# Patient Record
Sex: Female | Born: 1955 | Race: Asian | Hispanic: No | Marital: Married | Smoking: Never smoker
Health system: Southern US, Community
[De-identification: ages and names within clinical notes are randomized; demographics above are authoritative.]

## PROBLEM LIST (undated history)

## (undated) DIAGNOSIS — Z87898 Personal history of other specified conditions: Secondary | ICD-10-CM

## (undated) DIAGNOSIS — C539 Malignant neoplasm of cervix uteri, unspecified: Secondary | ICD-10-CM

## (undated) DIAGNOSIS — C541 Malignant neoplasm of endometrium: Secondary | ICD-10-CM

## (undated) DIAGNOSIS — C52 Malignant neoplasm of vagina: Secondary | ICD-10-CM

## (undated) DIAGNOSIS — N289 Disorder of kidney and ureter, unspecified: Secondary | ICD-10-CM

## (undated) HISTORY — PX: ABDOMINAL HYSTERECTOMY: SHX81

---

## 2007-04-15 ENCOUNTER — Other Ambulatory Visit: Admission: RE | Admit: 2007-04-15 | Discharge: 2007-04-15 | Payer: Self-pay | Admitting: Obstetrics & Gynecology

## 2007-04-27 ENCOUNTER — Ambulatory Visit: Admission: RE | Admit: 2007-04-27 | Discharge: 2007-04-27 | Payer: Self-pay | Admitting: Gynecologic Oncology

## 2007-05-04 ENCOUNTER — Ambulatory Visit (HOSPITAL_COMMUNITY): Admission: RE | Admit: 2007-05-04 | Discharge: 2007-05-04 | Payer: Self-pay | Admitting: Gynecology

## 2007-08-31 ENCOUNTER — Ambulatory Visit: Admission: RE | Admit: 2007-08-31 | Discharge: 2007-08-31 | Payer: Self-pay | Admitting: Gynecologic Oncology

## 2008-06-19 ENCOUNTER — Ambulatory Visit (HOSPITAL_COMMUNITY): Admission: RE | Admit: 2008-06-19 | Discharge: 2008-06-19 | Payer: Self-pay | Admitting: Gynecologic Oncology

## 2009-01-19 ENCOUNTER — Encounter: Admission: RE | Admit: 2009-01-19 | Discharge: 2009-01-19 | Payer: Self-pay | Admitting: Family Medicine

## 2009-01-30 ENCOUNTER — Encounter: Admission: RE | Admit: 2009-01-30 | Discharge: 2009-01-30 | Payer: Self-pay | Admitting: Family Medicine

## 2009-02-27 ENCOUNTER — Ambulatory Visit (HOSPITAL_COMMUNITY): Admission: RE | Admit: 2009-02-27 | Discharge: 2009-02-27 | Payer: Self-pay | Admitting: Surgery

## 2009-02-27 ENCOUNTER — Encounter: Admission: RE | Admit: 2009-02-27 | Discharge: 2009-02-27 | Payer: Self-pay | Admitting: Surgery

## 2009-12-07 ENCOUNTER — Encounter: Admission: RE | Admit: 2009-12-07 | Discharge: 2009-12-07 | Payer: Self-pay | Admitting: Surgery

## 2010-02-11 ENCOUNTER — Encounter: Admission: RE | Admit: 2010-02-11 | Discharge: 2010-02-11 | Payer: Self-pay | Admitting: Surgery

## 2010-03-24 DIAGNOSIS — C549 Malignant neoplasm of corpus uteri, unspecified: Secondary | ICD-10-CM

## 2010-04-28 ENCOUNTER — Encounter: Payer: Self-pay | Admitting: Family Medicine

## 2010-07-10 LAB — DIFFERENTIAL
Basophils Absolute: 0.1 10*3/uL (ref 0.0–0.1)
Eosinophils Relative: 2 % (ref 0–5)
Lymphocytes Relative: 22 % (ref 12–46)
Lymphs Abs: 2 10*3/uL (ref 0.7–4.0)
Neutro Abs: 6.1 10*3/uL (ref 1.7–7.7)
Neutrophils Relative %: 68 % (ref 43–77)

## 2010-07-10 LAB — GLUCOSE, CAPILLARY
Glucose-Capillary: 84 mg/dL (ref 70–99)
Glucose-Capillary: 92 mg/dL (ref 70–99)

## 2010-07-10 LAB — COMPREHENSIVE METABOLIC PANEL WITH GFR
ALT: 18 U/L (ref 0–35)
AST: 14 U/L (ref 0–37)
Albumin: 3.5 g/dL (ref 3.5–5.2)
Alkaline Phosphatase: 56 U/L (ref 39–117)
BUN: 14 mg/dL (ref 6–23)
CO2: 26 meq/L (ref 19–32)
Calcium: 9.4 mg/dL (ref 8.4–10.5)
Chloride: 108 meq/L (ref 96–112)
Creatinine, Ser: 0.68 mg/dL (ref 0.4–1.2)
GFR calc non Af Amer: >60 mL/min
Glucose, Bld: 95 mg/dL (ref 70–99)
Potassium: 4.1 meq/L (ref 3.5–5.1)
Sodium: 141 meq/L (ref 135–145)
Total Bilirubin: 0.5 mg/dL (ref 0.3–1.2)
Total Protein: 6.4 g/dL (ref 6.0–8.3)

## 2010-07-10 LAB — CBC
HCT: 38.2 % (ref 36.0–46.0)
Hemoglobin: 13.3 g/dL (ref 12.0–15.0)
MCHC: 34.8 g/dL (ref 30.0–36.0)
MCV: 99.1 fL (ref 78.0–100.0)
Platelets: 307 K/uL (ref 150–400)
RBC: 3.85 MIL/uL — ABNORMAL LOW (ref 3.87–5.11)
RDW: 13.8 % (ref 11.5–15.5)
WBC: 9 K/uL (ref 4.0–10.5)

## 2010-08-20 NOTE — H&P (Signed)
Brandy Hardy, Brandy Hardy              ACCOUNT NO.:  [id number]   MEDICAL RECORD NO.:  [id number]          PATIENT TYPE:  OUT   LOCATION:  GYN                          FACILITY:  [facility]   PHYSICIAN:  Paola A. Duard Brady, MD    DATE OF BIRTH:  12-Mar-1956   DATE OF ADMISSION:  04/27/2007  DATE OF DISCHARGE:                              HISTORY & PHYSICAL   The patient is seen today in consultation at the request of Dr. Hyacinth Meeker.  Brandy Hardy is a 55 year old gravida 1, para 1 with a very complicated  past medical history.  Back in 2002, she had an abnormal Pap smear and  had a cervical polyp which was noted that revealed well-differentiated  adenocarcinoma.  Initially they thought it was an endometrial cancer as  she had a CT scan the abdomen and pelvis revealed an enlarged uterus  with prominence of the endometrium.  She was taken to the operating room  on November 01, 2006, by Dr. Ursula Beath in [place] for an exam under  anesthesia and cervical biopsies.  At that time, there was a large  fungating lesion in the cervix, replacing the entire cervix  circumferentially and it was felt that this most likely represented a  cervical cancer rather than a drop-down metastasis from an endometrial  cancer.  She subsequently completed external and internal radiation in  October 2002.  Per the records that we have, she received 8188 on the  left, 7897 on the right.  It did appear that she received 5040 to the  whole pelvis with additional intracavitary brachytherapy but it is  unclear whether she only received one brachytherapy rather than two or  if the plan was to proceed with interval hysterectomy.  From the notes  we had, she appeared to have had an excellent response to her radiation  therapy with no visible disease.  However, in May 2003, the patient went  TAH/BSO.  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  She had a large myoma involving the cervix.  She had  follow-up after that point, per the patient on February 2007, however,  the last notes we have are from April 2004, at which time she had a  negative CT scan of the abdomen and pelvis.  The patient states her  visit with Dr. Everardo Beals was in February 2007, and has had no care since  that time.  She and her husband moved to [place] due to expenses  in [place] and she was seen by Dr. Hyacinth Meeker in 2009.  In January, when  she saw Dr. Hyacinth Meeker, she was noted to have lesion at the top of the  vaginal cuff.  Biopsy was performed that was consistent with  adenocarcinoma compatible with endometrioid __________  grade 2.  The  patient did have imaging performed consisting of a chest x-ray which  showed borderline to mild cardiomegaly.  CT scan of the abdomen and  pelvis revealed diffuse low-density in the  liver consistent with fatty  infiltration of the liver.  There was no definite evidence of adenopathy  or metastatic disease with no pelvic or inguinal masses, no pelvic  adenopathy or free fluid, no lytic or blastic lesions noted.  This CT  scan was performed here in [place], [place].  The patient was  also no complaints.  She denies any vaginal bleeding or pain.  She is  sexually active and denies any postcoital bleeding.   PAST MEDICAL HISTORY:  1. Endometrial cancer.  2. Arthritis in her knees.  3. Hypercholesterolemia.   ALLERGIES:  NONE.   PAST SURGICAL HISTORY:  Hysterectomy as stated above in 2003.   SOCIAL HISTORY:  She denies tobacco or alcohol.  She is married.  Her  husband is a Naval architect.  She is a gravida 1, para 1 and she works as  travel Water quality scientist.   MEDICATIONS:  Zocor and an anti-inflammatory   FAMILY HISTORY:  Her father died of an MI.  Her mother and sister both  have high blood pressure.   HEALTH MAINTENANCE:  She is not sure when  her last mammogram was, it was  before 2007.  She has never had colonoscopy or done fecal occult blood  testing.   PHYSICAL EXAMINATION:  VITAL SIGNS:  Weight 343 pounds, height 5 feet 2,  blood pressure 148/92, pulse 92.  GENERAL APPEARANCE:  Well-nourished, well-developed female in no acute  distress.  NECK:  Supple.  There is no lymphadenopathy.  No thyromegaly.  LUNGS:  Clear to auscultation bilaterally with distant breath sounds.  CARDIOVASCULAR:  Regular rate and rhythm.  ABDOMEN:  She has a large pannus when standing extending below her mid  thigh.  There is significant discoloration under the pannus.  There is a  high transverse skin incision.  There are no palpable masses or  hepatosplenomegaly, though exam is limited by habitus.  Groins are  negative for adenopathy.  EXTREMITIES:  She has 3+ nonpitting edema, equal bilaterally, with  chronic venostasis changes, equal bilaterally.  PELVIC:  External genitalia is within normal limits.  Vagina is  atrophic.  At the top of the vaginal cuff, it almost appears as if there  is a small portion of cervix versus agglutinated vagina.  It is  difficult to ascertain.  However, at the midpoint there is approximately  a 1 x 1 cm erosion.  Bimanual examination is limited secondary to her  habitus.  However, on rectal examination again there is either a small  rim of cervix remaining versus an area of scar and tumor which measures  approximately 2 cm in size.  It is well circumscribed.  It is not mobile  but does not appear fixed.  The parametria are difficult to ascertain  due to her morbid obesity, but they do not appear to be enlarged or  involved.   ASSESSMENT:  A 55 year old with a history of a stage 2b endometrial  carcinoma who was treated with preoperative radiation therapy followed  by hysterectomy.  It is difficult to ascertain if her radiation was full  dose or not.  The patient does have a central recurrence.  I did discuss   with her that ideally an exonerative procedure would be attempted here,  however, because of her morbid obesity, I do not believe that she is an  exonerative candidate.  I did explain this to her and she seems to  understand.  We did discuss the possibility of proceeding with  chemotherapy as  well as additional radiation.  We do have some records  and I would like her to see Dr. Roselind Messier for evaluation of whether or not  there would be a roll for additional radiation therapy.  She has an  appointment to see him on May 03, 2007.  Her case is very unusual  and I will discuss this my partners at [place], [place].  She  knows that I will communicate with Harriett Sine whether or not there is any  other ideas for treatment.  Her questions were answered.  Her husband  was not able to come with her today as he is a Naval architect and was on  the road.  I communicated with him by cell phone and spoke with him and  answered his questions.  They do know that we do need a little more time  to obtain further information before completely delineating a treatment  plan.  I did discuss weight loss with the patient.  The patient's  husband did state concern regarding her attitude and whether or not she  was depressed.  I did address this with the patient.  She states she is  not depressed, she is just scared and she may be  amenable to antidepressant therapy if needed, but at this point, does  not feel that she wants to proceed with that.  I think it is reasonable  she does have a good understanding of her current situation.  We will  see her again in follow-up pending her consultation with Dr. Roselind Messier.      Paola A. Duard Brady, MD  Electronically Signed     PAG/MEDQ  D:  04/27/2007  T:  04/27/2007  Job:  [id number]   cc:   Jeanice Lim, M.D.   Rodolph Bong, M.D.  Fax: [phone]   Billie Lade, M.D.  Fax: [phone]   Telford Nab, R.N.  [address]  [place], [place] [address]   M.  Leda Quail, MD  Fax: [phone]

## 2010-08-20 NOTE — Consult Note (Signed)
NAMEBOBBE, PILZ              ACCOUNT NO.:  [id number]   MEDICAL RECORD NO.:  [id number]          PATIENT TYPE:  OUT   LOCATION:  GYN                          FACILITY:  [facility]   PHYSICIAN:  John T. Kyla Balzarine, M.D.    DATE OF BIRTH:  [birth year]   DATE OF CONSULTATION:  08/31/2007  DATE OF DISCHARGE:                                 CONSULTATION   CHIEF COMPLAINT:  Followup of recurrent endometrial cancer, on Megace.   HISTORY OF PRESENT ILLNESS:  This patient had a clinical stage IIb  endometrial cancer treated as if it were a cervix cancer with external  beam radiation and brachytherapy, completing treatment in October of  2002.  She then had a completion hysterectomy which revealed invasive  endometrial cancer.  She received no additional adjuvant treatment and  was without evidence of disease.  She then moved to [place] and  in January of 2009 underwent a biopsy of a vaginal lesion which was  compatible with a grade 2/moderately-differentiated endometrioid  adenocarcinoma compatible with an endometrial primary.  She had high  percentage ER and PR receptor positive cells.  She has undergone  significant evaluation radiographically, revealing no evidence of  recurrent disease.   On examination at [facility] in February, she had some thickening of the right  parametrium and given her morbid obesity and parametrium thickening, it  was recommended that she be treated with Megace.  She has completed 3  months of Megace therapy with intermittent hot flashes.  She has been  actively working to lose weight and per her verbal report has lost 15  pounds.  She denies vaginal bleeding, pelvic pain or leg swelling.   PAST MEDICAL HISTORY:  Stage IIb endometrial cancer and has noted above,  morbid obesity, bilateral arthritis the of knees, and  hypercholesterolemia.   MEDICATIONS:  Zocor.   ALLERGIES:  None.   FAMILY HISTORY:  No known malignancies.   PERSONAL AND SOCIAL HISTORY:  Denies  tobacco, ethanol or illicit drug  use.   REVIEW OF SYSTEMS:  Dyspnea on exertion, no orthopnea.  Lower extremity  edema.  Otherwise balance of 10 system review is negative.   EXAM:  VITAL SIGNS:  Stable and afebrile is recorded.  The patient is  anxious, alert and oriented x3, in no acute distress.  LYMPH SURVEY:  Negative for pathologic lymphadenopathy.  ABDOMEN:  Obese, soft and benign with a large pannus that extends into  the mid thigh region.  Speculum examination reveals atrophy with  radiation effect at the upper vagina.  There is no mass per se but there  is a prominent right lateral dimple in the vagina.  No overt friability  although vaginal mucosa is markedly atrophic with some contact bleeding.  Overall, this comprises a significant response to hormonal therapy.   ASSESSMENT:  Recurrent endometrial cancer.   PLAN:  Continue Megace 80 mg b.i.d. and return for followup in 3 months.      John T. Kyla Balzarine, M.D.  Electronically Signed     JTS/MEDQ  D:  08/31/2007  T:  08/31/2007  Job:  [id number]  cc:   Telford Nab, R.N.  [address]  [place], [place] [address]

## 2011-03-13 ENCOUNTER — Other Ambulatory Visit: Payer: Self-pay | Admitting: Family Medicine

## 2011-03-13 DIAGNOSIS — R921 Mammographic calcification found on diagnostic imaging of breast: Secondary | ICD-10-CM

## 2011-06-21 ENCOUNTER — Other Ambulatory Visit: Payer: Self-pay | Admitting: Obstetrics and Gynecology

## 2011-07-02 NOTE — Telephone Encounter (Signed)
May refill Megace

## 2011-07-24 ENCOUNTER — Ambulatory Visit
Admission: RE | Admit: 2011-07-24 | Discharge: 2011-07-24 | Disposition: A | Discharge status: Home / Self Care | Payer: 59 | Source: Ambulatory Visit | Attending: Family Medicine | Admitting: Family Medicine

## 2011-07-24 DIAGNOSIS — R921 Mammographic calcification found on diagnostic imaging of breast: Secondary | ICD-10-CM

## 2012-01-20 ENCOUNTER — Other Ambulatory Visit: Payer: Self-pay | Admitting: Obstetrics and Gynecology

## 2012-02-22 ENCOUNTER — Other Ambulatory Visit: Payer: Self-pay | Admitting: Obstetrics and Gynecology

## 2012-08-31 ENCOUNTER — Other Ambulatory Visit: Payer: Self-pay

## 2012-08-31 DIAGNOSIS — Z1231 Encounter for screening mammogram for malignant neoplasm of breast: Secondary | ICD-10-CM

## 2012-09-03 ENCOUNTER — Ambulatory Visit
Admission: RE | Admit: 2012-09-03 | Discharge: 2012-09-03 | Disposition: A | Discharge status: Home / Self Care | Payer: 59 | Source: Ambulatory Visit

## 2012-09-03 DIAGNOSIS — Z1231 Encounter for screening mammogram for malignant neoplasm of breast: Secondary | ICD-10-CM

## 2012-09-16 DIAGNOSIS — I10 Essential (primary) hypertension: Secondary | ICD-10-CM | POA: Insufficient documentation

## 2012-09-16 DIAGNOSIS — E119 Type 2 diabetes mellitus without complications: Secondary | ICD-10-CM | POA: Insufficient documentation

## 2012-11-22 ENCOUNTER — Other Ambulatory Visit: Payer: Self-pay | Admitting: *Deleted

## 2012-11-22 ENCOUNTER — Other Ambulatory Visit (INDEPENDENT_AMBULATORY_CARE_PROVIDER_SITE_OTHER): Payer: 59

## 2012-11-22 DIAGNOSIS — E785 Hyperlipidemia, unspecified: Secondary | ICD-10-CM

## 2012-11-22 DIAGNOSIS — E119 Type 2 diabetes mellitus without complications: Secondary | ICD-10-CM

## 2012-11-22 LAB — COMPREHENSIVE METABOLIC PANEL
ALT: 17 U/L (ref 0–35)
AST: 19 U/L (ref 0–37)
Albumin: 3.4 g/dL — ABNORMAL LOW (ref 3.5–5.2)
BUN: 13 mg/dL (ref 6–23)
CO2: 26 mEq/L (ref 19–32)
Calcium: 8.8 mg/dL (ref 8.4–10.5)
Chloride: 103 mEq/L (ref 96–112)
GFR: 88.67 mL/min (ref >60.00)
Potassium: 3.7 mEq/L (ref 3.5–5.1)

## 2012-11-22 LAB — LIPID PANEL
Cholesterol: 116 mg/dL (ref 0–200)
LDL Cholesterol: 56 mg/dL (ref 0–99)
Triglycerides: 88 mg/dL (ref 0.0–149.0)

## 2012-11-23 ENCOUNTER — Ambulatory Visit (INDEPENDENT_AMBULATORY_CARE_PROVIDER_SITE_OTHER): Payer: 59 | Admitting: Endocrinology

## 2012-11-23 ENCOUNTER — Encounter: Payer: Self-pay | Admitting: Endocrinology

## 2012-11-23 VITALS — BP 114/60 | HR 75 | Temp 97.9°F | Resp 12 | Ht 63.0 in | Wt 333.2 lb

## 2012-11-23 DIAGNOSIS — E785 Hyperlipidemia, unspecified: Secondary | ICD-10-CM | POA: Insufficient documentation

## 2012-11-23 DIAGNOSIS — E119 Type 2 diabetes mellitus without complications: Secondary | ICD-10-CM | POA: Insufficient documentation

## 2012-11-23 DIAGNOSIS — R609 Edema, unspecified: Secondary | ICD-10-CM

## 2012-11-23 DIAGNOSIS — E559 Vitamin D deficiency, unspecified: Secondary | ICD-10-CM

## 2012-11-23 DIAGNOSIS — I10 Essential (primary) hypertension: Secondary | ICD-10-CM

## 2012-11-23 MED ORDER — HYDROCHLOROTHIAZIDE 12.5 MG PO CAPS: 12.5000 mg | ORAL_CAPSULE | Freq: Every day | ORAL | Status: DC

## 2012-11-23 NOTE — Progress Notes (Signed)
Patient ID: Brandy Hardy, female   DOB: [birth year], 57 y.o.   MRN: [id number]  Chief complaint: Ankle pain  History of Present Illness:  She is complaining of discomfort above the ankle which is more towards the evening but not when lying down or walking.   She has had multiple other problems as follows:   History of mild diabetes, very well controlled with 1500 mg metformin and normal A1c of 5.8. She has been on metformin for 2-3 years. Also fasting blood sugar is normal. She does not exercise since she thinks she is working long hours. Also has had some knee pains  She has had difficulty losing weight but has not been interested in weight loss medications  Hyperlipidemia with history of increase in LDL particle number. Lipids are well controlled now with a regimen of Lipitor and niacin  Hypertension: This has been relatively mild and currently taking a combination of lisinopril and amlodipine 5 mg  History of vitamin D deficiency, taking weekly supplement  She is taking 81 mg aspirin      Medication List       This list is accurate as of: 11/23/12  9:36 AM.  Always use your most recent med list.               amLODipine 5 MG tablet  Commonly known as:  NORVASC  Take 5 mg by mouth daily.     aspirin 325 MG tablet  Take 325 mg by mouth daily.     atorvastatin 40 MG tablet  Commonly known as:  LIPITOR  Take 40 mg by mouth daily.     lisinopril 20 MG tablet  Commonly known as:  PRINIVIL,ZESTRIL  Take 20 mg by mouth daily.     megestrol 40 MG tablet  Commonly known as:  MEGACE  TAKE 2 TABLETS BY MOUTH TWICE DAILY     metFORMIN 750 MG 24 hr tablet  Commonly known as:  GLUCOPHAGE-XR  Take 750 mg by mouth daily with breakfast. 2 tablets with breakfast     niacin 500 MG tablet  Commonly known as:  SLO-NIACIN  Take 500 mg by mouth daily. 3 tablets daily     oxaprozin 600 MG tablet  Commonly known as:  DAYPRO  Take 600 mg by mouth daily. 2 tablets daily     Vitamin D (Ergocalciferol) 50000 UNITS Caps capsule  Commonly known as:  DRISDOL  Take 50,000 Units by mouth.        Allergies: No Known Allergies  No past medical history on file.  No past surgical history on file.  No family history on file.  Social History:  reports that she has never smoked. She has never used smokeless tobacco. Her alcohol and drug histories are not on file.  Review of Systems  She is on Megace for her uterine cancer.  Appointment on 11/22/2012  Component Date Value Range Status  . Cholesterol 11/22/2012 116  0 - 200 mg/dL Final   ATP III Classification       Desirable:  < 200 mg/dL               Borderline High:  200 - 239 mg/dL          High:  > = 578 mg/dL  . Triglycerides 11/22/2012 88.0  0.0 - 149.0 mg/dL Final   Normal:  <469 mg/dLBorderline High:  150 - 199 mg/dL  . HDL 11/22/2012 42.30  >39.00 mg/dL Final  . VLDL 62/95/2841 17.6  0.0 - 40.0 mg/dL Final  . LDL Cholesterol 11/22/2012 56  0 - 99 mg/dL Final  . Total CHOL/HDL Ratio 11/22/2012 3   Final                  Men          Women1/2 Average Risk     3.4          3.3Average Risk          5.0          4.42X Average Risk          9.6          7.13X Average Risk          15.0          11.0                      . Sodium 11/22/2012 137  135 - 145 mEq/L Final  . Potassium 11/22/2012 3.7  3.5 - 5.1 mEq/L Final  . Chloride 11/22/2012 103  96 - 112 mEq/L Final  . CO2 11/22/2012 26  19 - 32 mEq/L Final  . Glucose, Bld 11/22/2012 87  70 - 99 mg/dL Final  . BUN 38/25/0539 13  6 - 23 mg/dL Final  . Creatinine, Ser 11/22/2012 0.7  0.4 - 1.2 mg/dL Final  . Total Bilirubin 11/22/2012 0.5  0.3 - 1.2 mg/dL Final  . Alkaline Phosphatase 11/22/2012 55  39 - 117 U/L Final  . AST 11/22/2012 19  0 - 37 U/L Final  . ALT 11/22/2012 17  0 - 35 U/L Final  . Total Protein 11/22/2012 6.5  6.0 - 8.3 g/dL Final  . Albumin 76/73/4193 3.4* 3.5 - 5.2 g/dL Final  . Calcium 79/05/4095 8.8  8.4 - 10.5 mg/dL Final  . GFR  35/32/9924 88.67  >60.00 mL/min Final  . Hemoglobin A1C 11/22/2012 5.8  4.6 - 6.5 % Final   Glycemic Control Guidelines for People with Diabetes:Non Diabetic:  <6%Goal of Therapy: <7%Additional Action Suggested:  >8%     EXAM:  BP 114/60  Pulse 75  Temp(Src) 97.9 F (36.6 C)  Resp 12  Ht 5\' 3"  (1.6 m)  Wt 333 lb 3.2 oz (151.139 kg)  BMI 59.04 kg/m2  SpO2 97%  1+ lower leg edema above the sock line Mild petechial type erythema just above the sock line No pedal edema No skin lesions on the feet or other abnormalities  Assessment/Plan:    History of mild diabetes, well controlled with metformin and normal A1c. She does not exercise and recommend at least walking on her days off, more efforts to lose weight. Since her A1c has been consistently good will not start her on home glucose monitoring  Mild edema and some stasis irritation of her lower legs. Will change her amlodipine to HCTZ and this may help. She will call if not better, consider dermatology consultation  Hyperlipidemia with history of increase in LDL particle number. Lipids are well controlled now with a regimen of Lipitor and niacin, will repeat an MR panel on the next visit  Hypertension: Excellent control at present, will followup with medication change as above  History of vitamin D deficiency, will need to have followup levels on the next visit  Morbid obesity, discussed increase efforts to lose weight with diet and exercise. Has difficulty losing weight because of taking Megace from gynecologist  [facility] 11/23/2012, 9:36 AM

## 2012-11-23 NOTE — Patient Instructions (Addendum)
Stop Amlodipine and start HCTZ 12.5mg  daily

## 2013-02-21 ENCOUNTER — Other Ambulatory Visit (INDEPENDENT_AMBULATORY_CARE_PROVIDER_SITE_OTHER): Payer: 59

## 2013-02-21 ENCOUNTER — Ambulatory Visit (INDEPENDENT_AMBULATORY_CARE_PROVIDER_SITE_OTHER): Payer: 59 | Admitting: Endocrinology

## 2013-02-21 ENCOUNTER — Encounter: Payer: Self-pay | Admitting: Endocrinology

## 2013-02-21 VITALS — BP 128/68 | HR 108 | Temp 98.3°F | Resp 12 | Ht 63.0 in | Wt 325.9 lb

## 2013-02-21 DIAGNOSIS — I10 Essential (primary) hypertension: Secondary | ICD-10-CM

## 2013-02-21 DIAGNOSIS — E559 Vitamin D deficiency, unspecified: Secondary | ICD-10-CM

## 2013-02-21 DIAGNOSIS — E785 Hyperlipidemia, unspecified: Secondary | ICD-10-CM

## 2013-02-21 DIAGNOSIS — E119 Type 2 diabetes mellitus without complications: Secondary | ICD-10-CM

## 2013-02-21 DIAGNOSIS — IMO0002 Reserved for concepts with insufficient information to code with codable children: Secondary | ICD-10-CM

## 2013-02-21 DIAGNOSIS — M1710 Unilateral primary osteoarthritis, unspecified knee: Secondary | ICD-10-CM

## 2013-02-21 LAB — URINALYSIS, ROUTINE W REFLEX MICROSCOPIC
Ketones, ur: NEGATIVE
Specific Gravity, Urine: >=1.03 (ref 1.000–1.030)
Urine Glucose: NEGATIVE
Urobilinogen, UA: 0.2 (ref 0.0–1.0)
pH: 6 (ref 5.0–8.0)

## 2013-02-21 LAB — MICROALBUMIN / CREATININE URINE RATIO: Creatinine,U: 214.5 mg/dL

## 2013-02-21 NOTE — Patient Instructions (Signed)
Walk as much as possible

## 2013-02-21 NOTE — Progress Notes (Signed)
Patient ID: Brandy Hardy, female   DOB: 04-11-1955, 57 y.o.   MRN: [id number]  Chief complaint:  Followup of various issues  History of Present Illness:  She has had multiple  problems as follows:   History of mild diabetes, very well controlled with 1500 mg metformin and usually upper normal A1c. She has been on metformin for 2-3 years. Again fasting blood sugar is normal. She does not exercise much because of her work schedule. Also has had some knee pains. A1c again excellent at 5.8  She has had difficulty losing weight but has lost some weight since her last visit, possibly from less edema  Ankle swelling: This is much better now with starting HCTZ instead of amlodipine  Hyperlipidemia with history of increase in LDL particle number. Lipids are well controlled now with a regimen of Lipitor and niacin, HDL improved but still low normal  Hypertension: This has been relatively mild and currently taking a combination of lisinopril and HCTZ 12.5 mg  History of vitamin D deficiency, taking weekly supplement  She is taking 81 mg aspirin for prophylaxis  Her orthopedic surgeon has reduced her anti-inflammatory drug to once a day now and she takes Tylenol as needed    Wt Readings from Last 3 Encounters:  02/21/13 325 lb 14.4 oz (147.827 kg)  11/23/12 333 lb 3.2 oz (151.139 kg)      Medication List       This list is accurate as of: 02/21/13  8:34 AM.  Always use your most recent med list.               amLODipine 5 MG tablet  Commonly known as:  NORVASC     aspirin 325 MG tablet  Take 325 mg by mouth daily.     atorvastatin 40 MG tablet  Commonly known as:  LIPITOR  Take 40 mg by mouth daily.     hydrochlorothiazide 12.5 MG capsule  Commonly known as:  MICROZIDE  Take 1 capsule (12.5 mg total) by mouth daily.     lisinopril 20 MG tablet  Commonly known as:  PRINIVIL,ZESTRIL  Take 20 mg by mouth daily.     megestrol 40 MG tablet  Commonly known as:  MEGACE  TAKE  2 TABLETS BY MOUTH TWICE DAILY     metFORMIN 750 MG 24 hr tablet  Commonly known as:  GLUCOPHAGE-XR  Take 750 mg by mouth daily with breakfast. 2 tablets with breakfast     niacin 500 MG tablet  Commonly known as:  SLO-NIACIN  Take 500 mg by mouth daily. 3 tablets daily     oxaprozin 600 MG tablet  Commonly known as:  DAYPRO  Take 600 mg by mouth daily. 2 tablets daily     Vitamin D (Ergocalciferol) 50000 UNITS Caps capsule  Commonly known as:  DRISDOL  Take 50,000 Units by mouth.        Allergies: No Known Allergies  No past medical history on file.  No past surgical history on file.  No family history on file.  Social History:  reports that she has never smoked. She has never used smokeless tobacco. Her alcohol and drug histories are not on file.  Review of Systems  She is on Megace for her uterine cancer.  No snoring  No visits with results within 1 Week(s) from this visit. Latest known visit with results is:  Appointment on 11/22/2012  Component Date Value Range Status  . Cholesterol 11/22/2012 116  0 - 200  mg/dL Final   ATP III Classification       Desirable:  < 200 mg/dL               Borderline High:  200 - 239 mg/dL          High:  > = 161 mg/dL  . Triglycerides 11/22/2012 88.0  0.0 - 149.0 mg/dL Final   Normal:  <096 mg/dLBorderline High:  150 - 199 mg/dL  . HDL 11/22/2012 42.30  >39.00 mg/dL Final  . VLDL 04/54/0981 17.6  0.0 - 40.0 mg/dL Final  . LDL Cholesterol 11/22/2012 56  0 - 99 mg/dL Final  . Total CHOL/HDL Ratio 11/22/2012 3   Final                  Men          Women1/2 Average Risk     3.4          3.3Average Risk          5.0          4.42X Average Risk          9.6          7.13X Average Risk          15.0          11.0                      . Sodium 11/22/2012 137  135 - 145 mEq/L Final  . Potassium 11/22/2012 3.7  3.5 - 5.1 mEq/L Final  . Chloride 11/22/2012 103  96 - 112 mEq/L Final  . CO2 11/22/2012 26  19 - 32 mEq/L Final  . Glucose,  Bld 11/22/2012 87  70 - 99 mg/dL Final  . BUN 19/14/7829 13  6 - 23 mg/dL Final  . Creatinine, Ser 11/22/2012 0.7  0.4 - 1.2 mg/dL Final  . Total Bilirubin 11/22/2012 0.5  0.3 - 1.2 mg/dL Final  . Alkaline Phosphatase 11/22/2012 55  39 - 117 U/L Final  . AST 11/22/2012 19  0 - 37 U/L Final  . ALT 11/22/2012 17  0 - 35 U/L Final  . Total Protein 11/22/2012 6.5  6.0 - 8.3 g/dL Final  . Albumin 56/21/3086 3.4* 3.5 - 5.2 g/dL Final  . Calcium 57/84/6962 8.8  8.4 - 10.5 mg/dL Final  . GFR 95/28/4132 88.67  >60.00 mL/min Final  . Hemoglobin A1C 11/22/2012 5.8  4.6 - 6.5 % Final   Glycemic Control Guidelines for People with Diabetes:Non Diabetic:  <6%Goal of Therapy: <7%Additional Action Suggested:  >8%     EXAM:  BP 128/68  Pulse 108  Temp(Src) 98.3 F (36.8 C)  Resp 12  Ht 5\' 3"  (1.6 m)  Wt 325 lb 14.4 oz (147.827 kg)  BMI 57.74 kg/m2  SpO2 95%   No pedal edema  Assessment/Plan:    History of mild diabetes, well controlled with metformin and normal A1c. She does not exercise  can of and recommend at least walking on her days off, more efforts to lose weight. Since her A1c has been consistently good will not start her on home glucose monitoring  Mild edema resolved with changing amlodipine to HCTZ   Hyperlipidemia with history of increase in LDL particle number. Lipids are well controlled now with a regimen of Lipitor and niacin, will repeat NMR panel on the next visit  Hypertension: Excellent control at present  History of vitamin D deficiency, will need  to have followup levels on the next visit  Morbid obesity, discussed increase efforts to lose weight with diet and exercise. Has difficulty losing weight because of taking Megace from gynecologist  [facility] 02/21/2013, 8:34 AM

## 2013-02-22 LAB — COMPREHENSIVE METABOLIC PANEL
Albumin: 3.7 g/dL (ref 3.5–5.2)
Alkaline Phosphatase: 58 U/L (ref 39–117)
BUN: 16 mg/dL (ref 6–23)
CO2: 25 mEq/L (ref 19–32)
Calcium: 9.6 mg/dL (ref 8.4–10.5)
GFR: 79.59 mL/min (ref >60.00)
Glucose, Bld: 98 mg/dL (ref 70–99)
Potassium: 3.8 mEq/L (ref 3.5–5.1)
Sodium: 142 mEq/L (ref 135–145)
Total Protein: 6.9 g/dL (ref 6.0–8.3)

## 2013-02-22 LAB — LIPOPROTEIN ANALYSIS BY NMR
HDL Particle Number: 33.9 umol/L (ref >=30.5)
LDL Size: 20.3 nm — ABNORMAL LOW (ref >20.5)
LP-IR Score: 69 — ABNORMAL HIGH (ref <=45)
Small LDL Particle Number: 754 nmol/L — ABNORMAL HIGH (ref <=527)

## 2013-02-22 LAB — LIPID PANEL
Cholesterol: 132 mg/dL (ref 0–200)
LDL Cholesterol: 68 mg/dL (ref 0–99)
Triglycerides: 89 mg/dL (ref 0.0–149.0)

## 2013-02-22 LAB — VITAMIN D 25 HYDROXY (VIT D DEFICIENCY, FRACTURES): Vit D, 25-Hydroxy: 46 ng/mL (ref 30–89)

## 2013-03-27 ENCOUNTER — Other Ambulatory Visit: Payer: Self-pay | Admitting: Endocrinology

## 2013-06-29 ENCOUNTER — Other Ambulatory Visit: Payer: Self-pay | Admitting: Endocrinology

## 2013-07-01 ENCOUNTER — Other Ambulatory Visit (INDEPENDENT_AMBULATORY_CARE_PROVIDER_SITE_OTHER): Payer: 59

## 2013-07-01 ENCOUNTER — Ambulatory Visit: Payer: 59 | Admitting: Endocrinology

## 2013-07-01 ENCOUNTER — Ambulatory Visit (INDEPENDENT_AMBULATORY_CARE_PROVIDER_SITE_OTHER): Payer: 59 | Admitting: Endocrinology

## 2013-07-01 ENCOUNTER — Other Ambulatory Visit: Payer: 59

## 2013-07-01 ENCOUNTER — Encounter: Payer: Self-pay | Admitting: Endocrinology

## 2013-07-01 VITALS — BP 118/68 | HR 116 | Temp 98.1°F | Resp 16 | Ht 63.0 in | Wt 313.4 lb

## 2013-07-01 DIAGNOSIS — M179 Osteoarthritis of knee, unspecified: Secondary | ICD-10-CM

## 2013-07-01 DIAGNOSIS — E559 Vitamin D deficiency, unspecified: Secondary | ICD-10-CM

## 2013-07-01 DIAGNOSIS — E119 Type 2 diabetes mellitus without complications: Secondary | ICD-10-CM

## 2013-07-01 DIAGNOSIS — IMO0002 Reserved for concepts with insufficient information to code with codable children: Secondary | ICD-10-CM

## 2013-07-01 DIAGNOSIS — I10 Essential (primary) hypertension: Secondary | ICD-10-CM

## 2013-07-01 DIAGNOSIS — M1710 Unilateral primary osteoarthritis, unspecified knee: Secondary | ICD-10-CM

## 2013-07-01 DIAGNOSIS — E785 Hyperlipidemia, unspecified: Secondary | ICD-10-CM

## 2013-07-01 LAB — COMPREHENSIVE METABOLIC PANEL
ALK PHOS: 74 U/L (ref 39–117)
ALT: 33 U/L (ref 0–35)
AST: 24 U/L (ref 0–37)
Albumin: 4 g/dL (ref 3.5–5.2)
BUN: 14 mg/dL (ref 6–23)
CO2: 31 mEq/L (ref 19–32)
Calcium: 9.6 mg/dL (ref 8.4–10.5)
Chloride: 99 mEq/L (ref 96–112)
Creatinine, Ser: 0.7 mg/dL (ref 0.4–1.2)
GFR: 88.48 mL/min (ref >60.00)
Glucose, Bld: 97 mg/dL (ref 70–99)
POTASSIUM: 3.5 meq/L (ref 3.5–5.1)
Sodium: 137 mEq/L (ref 135–145)
Total Bilirubin: 0.4 mg/dL (ref 0.3–1.2)
Total Protein: 7.1 g/dL (ref 6.0–8.3)

## 2013-07-01 LAB — CBC WITH DIFFERENTIAL/PLATELET
Basophils Absolute: 0 10*3/uL (ref 0.0–0.1)
Basophils Relative: 0.5 % (ref 0.0–3.0)
Eosinophils Absolute: 0.2 10*3/uL (ref 0.0–0.7)
Eosinophils Relative: 3.3 % (ref 0.0–5.0)
HCT: 40.1 % (ref 36.0–46.0)
Hemoglobin: 13.2 g/dL (ref 12.0–15.0)
Lymphocytes Relative: 19.4 % (ref 12.0–46.0)
Lymphs Abs: 1.3 10*3/uL (ref 0.7–4.0)
MCHC: 33 g/dL (ref 30.0–36.0)
MCV: 99.9 fl (ref 78.0–100.0)
MONOS PCT: 7.5 % (ref 3.0–12.0)
Monocytes Absolute: 0.5 10*3/uL (ref 0.1–1.0)
NEUTROS PCT: 69.3 % (ref 43.0–77.0)
Neutro Abs: 4.6 10*3/uL (ref 1.4–7.7)
PLATELETS: 323 10*3/uL (ref 150.0–400.0)
RBC: 4.01 Mil/uL (ref 3.87–5.11)
RDW: 13.5 % (ref 11.5–14.6)
WBC: 6.7 10*3/uL (ref 4.5–10.5)

## 2013-07-01 LAB — HEMOGLOBIN A1C: HEMOGLOBIN A1C: 5.9 % (ref 4.6–6.5)

## 2013-07-01 NOTE — Progress Notes (Signed)
Patient ID: Brandy Hardy, female   DOB: 1955-10-19, 58 y.o.   MRN: [id number]   Chief complaint:  Followup of various issues  History of Present Illness:  She has had multiple  problems as follows:   History of mild diabetes, initially diagnosed on glucose tolerance test. This is very well controlled with 1500 mg metformin and usually upper normal A1c. She has been on metformin since 2009. She does not exercise  because of her work schedule and knee pains. A1c last was excellent at 5.8  Occaisonal loose stools 3x per day, mostly towards the evening and not associated with abdominal pain or nausea for about 2 weeks. Asking about side effects from medications  She has lost weight since her Megace was stopped  Ankle swelling: This is not a problem with starting HCTZ instead of amlodipine  Hyperlipidemia with history of increase in LDL particle number. LDL well controlled now with a regimen of Lipitor and niacin, HDL improved but still low normal. Her LDL particle number was high on the last visit  Hypertension: This has been relatively mild and currently taking a combination of lisinopril and HCTZ 12.5 mg. Did not stop her amlodipine as directed  History of vitamin D deficiency, taking weekly supplement  She does not see her primary care physician for regular preventive physical exams    Wt Readings from Last 3 Encounters:  07/01/13 313 lb 6.4 oz (142.157 kg)  02/21/13 325 lb 14.4 oz (147.827 kg)  11/23/12 333 lb 3.2 oz (151.139 kg)   LABS:  Lab Results  Component Value Date   HGBA1C 5.9 02/21/2013   HGBA1C 5.8 11/22/2012   Lab Results  Component Value Date   MICROALBUR 8.1* 02/21/2013   LDLCALC 68 02/21/2013   CREATININE 0.8 02/21/2013      Medication List       This list is accurate as of: 07/01/13 10:27 AM.  Always use your most recent med list.               acetaminophen 500 MG tablet  Commonly known as:  TYLENOL  Take 500 mg by mouth every 6 (six) hours as  needed.     amLODipine 5 MG tablet  Commonly known as:  NORVASC     atorvastatin 40 MG tablet  Commonly known as:  LIPITOR  TAKE 1 TABLET DAILY     hydrochlorothiazide 12.5 MG capsule  Commonly known as:  MICROZIDE  TAKE 1 CAPSULE BY MOUTH EVERY DAY     letrozole 2.5 MG tablet  Commonly known as:  FEMARA     lisinopril 20 MG tablet  Commonly known as:  PRINIVIL,ZESTRIL  Take 20 mg by mouth daily.     metFORMIN 750 MG 24 hr tablet  Commonly known as:  GLUCOPHAGE-XR  TAKE 2 TABLETS ONCE DAILY     niacin 500 MG tablet  Commonly known as:  SLO-NIACIN  Take 500 mg by mouth daily. 3 tablets daily     Vitamin D (Ergocalciferol) 50000 UNITS Caps capsule  Commonly known as:  DRISDOL  TAKE 1 CAPSULE WEEKLY        Allergies: No Known Allergies  No past medical history on file.  No past surgical history on file.  Family History  Problem Relation Age of Onset  . Hypertension Mother   . Heart disease Father 49    Expired  . Diabetes Neg Hx     Social History:  reports that she has never smoked. She has never used  smokeless tobacco. Her alcohol and drug histories are not on file.  Review of Systems  She is on Femara now for her uterine cancer.   EXAM:  BP 118/68  Pulse 116  Temp(Src) 98.1 F (36.7 C)  Resp 16  Ht 5\' 3"  (1.6 m)  Wt 313 lb 6.4 oz (142.157 kg)  BMI 55.53 kg/m2  SpO2 97%  No pedal edema  Assessment/Plan:    History of mild diabetes, well controlled with metformin. She does not exercise and discussed again the need for walking daily to help continue her weight loss. Since her weight loss probably will improve her glucose control will leave off her metformin especially with her having diarrhea recently. Followup in 4 months  Diarrhea: Not clear if etiology and she can leave off metformin for now. She will see PCP if continuing to have problems  Hyperlipidemia with history of increase in LDL particle number. Lipids are well controlled now with a  regimen of Lipitor and niacin, will repeat NMR panel on the next visit  Hypertension: Excellent control at present. Will leave off amlodipine as blood pressure is low normal  History of vitamin D deficiency  Brandy Hardy 07/01/2013, 10:27 AM

## 2013-07-01 NOTE — Patient Instructions (Addendum)
Stop Metformin until diarrhea stopped  Walk daily  Stop amlodipine

## 2013-07-02 LAB — VITAMIN D 25 HYDROXY (VIT D DEFICIENCY, FRACTURES): Vit D, 25-Hydroxy: 48 ng/mL (ref 30–89)

## 2013-07-03 LAB — LIPOPROTEIN ANALYSIS BY NMR
HDL PARTICLE NUMBER: 50.3 umol/L (ref >=30.5)
LDL Particle Number: 670 nmol/L (ref <1000)
LDL Size: 20.1 nm — ABNORMAL LOW (ref >20.5)
LP-IR SCORE: 47 — AB (ref <=45)
Small LDL Particle Number: 417 nmol/L (ref <=527)

## 2013-09-28 ENCOUNTER — Other Ambulatory Visit: Payer: Self-pay | Admitting: Endocrinology

## 2013-10-02 ENCOUNTER — Other Ambulatory Visit: Payer: Self-pay | Admitting: Endocrinology

## 2013-10-28 ENCOUNTER — Other Ambulatory Visit (INDEPENDENT_AMBULATORY_CARE_PROVIDER_SITE_OTHER): Payer: 59

## 2013-10-28 ENCOUNTER — Ambulatory Visit (INDEPENDENT_AMBULATORY_CARE_PROVIDER_SITE_OTHER): Payer: 59 | Admitting: Endocrinology

## 2013-10-28 ENCOUNTER — Encounter: Payer: Self-pay | Admitting: Endocrinology

## 2013-10-28 VITALS — BP 106/60 | HR 92 | Temp 98.0°F | Resp 16 | Ht 63.0 in | Wt 303.0 lb

## 2013-10-28 DIAGNOSIS — I10 Essential (primary) hypertension: Secondary | ICD-10-CM

## 2013-10-28 DIAGNOSIS — E785 Hyperlipidemia, unspecified: Secondary | ICD-10-CM

## 2013-10-28 DIAGNOSIS — E119 Type 2 diabetes mellitus without complications: Secondary | ICD-10-CM

## 2013-10-28 LAB — COMPREHENSIVE METABOLIC PANEL
ALBUMIN: 3.6 g/dL (ref 3.5–5.2)
ALT: 22 U/L (ref 0–35)
AST: 24 U/L (ref 0–37)
Alkaline Phosphatase: 75 U/L (ref 39–117)
BUN: 18 mg/dL (ref 6–23)
CALCIUM: 9.2 mg/dL (ref 8.4–10.5)
CO2: 28 meq/L (ref 19–32)
Chloride: 102 mEq/L (ref 96–112)
Creatinine, Ser: 0.6 mg/dL (ref 0.4–1.2)
GFR: 103.1 mL/min (ref >60.00)
GLUCOSE: 103 mg/dL — AB (ref 70–99)
Potassium: 3.7 mEq/L (ref 3.5–5.1)
SODIUM: 137 meq/L (ref 135–145)
TOTAL PROTEIN: 6.7 g/dL (ref 6.0–8.3)
Total Bilirubin: 0.6 mg/dL (ref 0.2–1.2)

## 2013-10-28 LAB — LIPID PANEL
Cholesterol: 146 mg/dL (ref 0–200)
HDL: 49.6 mg/dL (ref >39.00)
LDL Cholesterol: 64 mg/dL (ref 0–99)
NonHDL: 96.4
Total CHOL/HDL Ratio: 3
Triglycerides: 162 mg/dL — ABNORMAL HIGH (ref 0.0–149.0)
VLDL: 32.4 mg/dL (ref 0.0–40.0)

## 2013-10-28 LAB — HEMOGLOBIN A1C: Hgb A1c MFr Bld: 5.5 % (ref 4.6–6.5)

## 2013-10-28 NOTE — Progress Notes (Addendum)
Patient ID: Brandy Hardy, female   DOB: 1955/10/19, 59 y.o.   MRN: [id number]   Chief complaint:  Followup visit  History of Present Illness:  She has had multiple  problems as follows:   History of mild diabetes, initially diagnosed on glucose tolerance test. This had been very well controlled with 1500 mg metformin and usually upper normal A1c. She had been on metformin since 2009. Because of her symptoms of diarrhea and also weight loss metformin was stopped but she tried this again on her own. Since she had diarrhea the second time she did not continue this. She does not exercise  because of her work schedule and knee pains. A1c has been upper normal and pending from today  She has lost more weight since her Megace was stopped  Hyperlipidemia with history of increase in LDL particle number. LDL well controlled  with a regimen of Lipitor and niacin, HDL improved but still low normal. Her LDL particle number was much improved on the last visit. She was told to reduce her niacin to 2 tablets but she did not get the message  Hypertension: This has been relatively mild and currently taking a combination of half tablet lisinopril and HCTZ 12.5 mg. Did  stop her amlodipine as directed on her last visit and blood pressure was relatively low  History of vitamin D deficiency, taking weekly supplement  She does not see her primary care physician for regular preventive physical exams    Wt Readings from Last 3 Encounters:  10/28/13 303 lb (137.44 kg)  07/01/13 313 lb 6.4 oz (142.157 kg)  02/21/13 325 lb 14.4 oz (147.827 kg)   LABS:  Lab Results  Component Value Date   HGBA1C 5.9 07/01/2013   HGBA1C 5.9 02/21/2013   HGBA1C 5.8 11/22/2012   Lab Results  Component Value Date   MICROALBUR 8.1* 02/21/2013   LDLCALC 68 02/21/2013   CREATININE 0.7 07/01/2013      Medication List       This list is accurate as of: 10/28/13  8:44 AM.  Always use your most recent med list.                acetaminophen 500 MG tablet  Commonly known as:  TYLENOL  Take 500 mg by mouth every 6 (six) hours as needed.     amLODipine 5 MG tablet  Commonly known as:  NORVASC     atorvastatin 40 MG tablet  Commonly known as:  LIPITOR  TAKE 1 TABLET DAILY     hydrochlorothiazide 12.5 MG capsule  Commonly known as:  MICROZIDE  TAKE ONE CAPSULE BY MOUTH DAILY     letrozole 2.5 MG tablet  Commonly known as:  FEMARA     lisinopril 40 MG tablet  Commonly known as:  PRINIVIL,ZESTRIL  TAKE 1/2 TABLET DAILY     metFORMIN 750 MG 24 hr tablet  Commonly known as:  GLUCOPHAGE-XR  TAKE 2 TABLETS ONCE DAILY     niacin 500 MG tablet  Commonly known as:  SLO-NIACIN  Take 500 mg by mouth daily. 3 tablets daily     Vitamin D (Ergocalciferol) 50000 UNITS Caps capsule  Commonly known as:  DRISDOL  TAKE 1 CAPSULE WEEKLY        Allergies: No Known Allergies  No past medical history on file.  No past surgical history on file.  Family History  Problem Relation Age of Onset  . Hypertension Mother   . Heart disease Father 73  Expired  . Diabetes Neg Hx     Social History:  reports that she has never smoked. She has never used smokeless tobacco. Her alcohol and drug histories are not on file.  Review of Systems  She is getting radiation and chemotherapy for cervical cancer   EXAM:  BP 100/66  Pulse 92  Temp(Src) 98 F (36.7 C)  Resp 16  Ht 5\' 3"  (1.6 m)  Wt 303 lb (137.44 kg)  BMI 53.69 kg/m2  SpO2 94%  Repeat blood pressure 106/60  No pedal edema  Assessment/Plan:    History of mild diabetes, previously on metformin. Since she has lost significant amount of weight will reassess her diabetes control today with labs and decide on the need for any medications   Diarrhea: Resolved with stopping metformin  Hyperlipidemia with history of increase in LDL particle number. Lipids are well controlled now with a regimen of Lipitor and niacin, will review labs today    Hypertension: Blood pressure is low normal. Will leave off HCTZ as blood pressure is low normal  History of vitamin D deficiency  Brandy Hardy 10/28/2013, 8:44 AM   Addendum: All labs excellent, niacin dose 2 daily  Appointment on 10/28/2013  Component Date Value Ref Range Status  . Hemoglobin A1C 10/28/2013 5.5  4.6 - 6.5 % Final   Glycemic Control Guidelines for People with Diabetes:Non Diabetic:  <6%Goal of Therapy: <7%Additional Action Suggested:  >8%   . Sodium 10/28/2013 137  135 - 145 mEq/L Final  . Potassium 10/28/2013 3.7  3.5 - 5.1 mEq/L Final  . Chloride 10/28/2013 102  96 - 112 mEq/L Final  . CO2 10/28/2013 28  19 - 32 mEq/L Final  . Glucose, Bld 10/28/2013 103* 70 - 99 mg/dL Final  . BUN 02/72/5366 18  6 - 23 mg/dL Final  . Creatinine, Ser 10/28/2013 0.6  0.4 - 1.2 mg/dL Final  . Total Bilirubin 10/28/2013 0.6  0.2 - 1.2 mg/dL Final  . Alkaline Phosphatase 10/28/2013 75  39 - 117 U/L Final  . AST 10/28/2013 24  0 - 37 U/L Final  . ALT 10/28/2013 22  0 - 35 U/L Final  . Total Protein 10/28/2013 6.7  6.0 - 8.3 g/dL Final  . Albumin 44/06/4740 3.6  3.5 - 5.2 g/dL Final  . Calcium 59/56/3875 9.2  8.4 - 10.5 mg/dL Final  . GFR 64/33/2951 103.10  >60.00 mL/min Final  . Cholesterol 10/28/2013 146  0 - 200 mg/dL Final   ATP III Classification       Desirable:  < 200 mg/dL               Borderline High:  200 - 239 mg/dL          High:  > = 884 mg/dL  . Triglycerides 10/28/2013 162.0* 0.0 - 149.0 mg/dL Final   Normal:  <166 mg/dLBorderline High:  150 - 199 mg/dL  . HDL 10/28/2013 49.60  >39.00 mg/dL Final  . VLDL 10/05/1599 32.4  0.0 - 40.0 mg/dL Final  . LDL Cholesterol 10/28/2013 64  0 - 99 mg/dL Final  . Total CHOL/HDL Ratio 10/28/2013 3   Final                  Men          Women1/2 Average Risk     3.4          3.3Average Risk          5.0  4.42X Average Risk          9.6          7.13X Average Risk          15.0          11.0                      . NonHDL  10/28/2013 96.40   Final   NOTE:  Non-HDL goal should be 30 mg/dL higher than patient's LDL goal (i.e. LDL goal of < 70 mg/dL, would have non-HDL goal of < 100 mg/dL)

## 2013-10-28 NOTE — Patient Instructions (Signed)
Niacin 2 daily Stop HCTZ

## 2013-10-29 NOTE — Progress Notes (Signed)
Quick Note:  Glucose is nearly normal, no need to start metformin, cholesterol controlled, reduce niacin to 2 a day as discussed ______

## 2013-12-26 ENCOUNTER — Other Ambulatory Visit: Payer: Self-pay | Admitting: Endocrinology

## 2013-12-29 ENCOUNTER — Ambulatory Visit (INDEPENDENT_AMBULATORY_CARE_PROVIDER_SITE_OTHER): Payer: 59 | Admitting: Endocrinology

## 2013-12-29 ENCOUNTER — Encounter: Payer: Self-pay | Admitting: Endocrinology

## 2013-12-29 VITALS — BP 116/66 | HR 114 | Temp 98.6°F | Ht 63.0 in | Wt 298.0 lb

## 2013-12-29 DIAGNOSIS — R7309 Other abnormal glucose: Secondary | ICD-10-CM

## 2013-12-29 DIAGNOSIS — I10 Essential (primary) hypertension: Secondary | ICD-10-CM

## 2013-12-29 DIAGNOSIS — E785 Hyperlipidemia, unspecified: Secondary | ICD-10-CM

## 2013-12-29 DIAGNOSIS — R7302 Impaired glucose tolerance (oral): Secondary | ICD-10-CM

## 2013-12-29 LAB — BASIC METABOLIC PANEL
BUN: 13 mg/dL (ref 6–23)
CHLORIDE: 102 meq/L (ref 96–112)
CO2: 27 meq/L (ref 19–32)
Calcium: 9.2 mg/dL (ref 8.4–10.5)
Creatinine, Ser: 0.7 mg/dL (ref 0.4–1.2)
GFR: 95.97 mL/min (ref >60.00)
Glucose, Bld: 100 mg/dL — ABNORMAL HIGH (ref 70–99)
Potassium: 4.1 mEq/L (ref 3.5–5.1)
Sodium: 136 mEq/L (ref 135–145)

## 2013-12-29 NOTE — Progress Notes (Signed)
Patient ID: Brandy Hardy, female   DOB: Dec 04, 1955, 58 y.o.   MRN: [id number]   Chief complaint:  Followup visit  History of Present Illness:  She has had multiple  problems as follows:   History of mild diabetes, initially diagnosed on glucose tolerance test. This had been very well controlled with 1500 mg metformin and usually upper normal A1c. She had been on metformin since 2009. Because of her symptoms of diarrhea and also weight loss metformin was stopped  She does not exercise  because of her work schedule and knee pains. A1c has been in excellent range, last checked in 7/15. She said that she has received chemotherapy since 8/15 and does get dexamethasone but her last labs did not show high reading of glucose  She has lost more weight. Previously had gained weight on Megace. Also recently getting chemotherapy  Hyperlipidemia with history of increase in LDL particle number. LDL well controlled  with a regimen of Lipitor and niacin, HDL improved but still low normal. Her LDL particle number was much improved on the last visit. She was told to reduce her niacin to 2 tablets but she did not get the message  Hypertension: This has been relatively mild and currently taking  half tablet of 40 mg lisinopril  Did  stop her amlodipine and HCTZ 12.5 mg.as directed when blood pressure was relatively low on the last 2 visits and blood pressure is still low normal  History of vitamin D deficiency, taking weekly supplement  She does not see her primary care physician for regular preventive physical exams    Wt Readings from Last 3 Encounters:  10/28/13 303 lb (137.44 kg)  07/01/13 313 lb 6.4 oz (142.157 kg)  02/21/13 325 lb 14.4 oz (147.827 kg)   LABS:  Lab Results  Component Value Date   HGBA1C 5.5 10/28/2013   HGBA1C 5.9 07/01/2013   HGBA1C 5.9 02/21/2013   Lab Results  Component Value Date   MICROALBUR 8.1* 02/21/2013   LDLCALC 64 10/28/2013   CREATININE 0.6 10/28/2013       Medication List       This list is accurate as of: 12/29/13  8:22 AM.  Always use your most recent med list.               acetaminophen 500 MG tablet  Commonly known as:  TYLENOL  Take 500 mg by mouth every 6 (six) hours as needed.     amLODipine 5 MG tablet  Commonly known as:  NORVASC     atorvastatin 40 MG tablet  Commonly known as:  LIPITOR  TAKE 1 TABLET DAILY     dexamethasone 4 MG tablet  Commonly known as:  DECADRON  Take 8 mg by mouth.     letrozole 2.5 MG tablet  Commonly known as:  FEMARA     lisinopril 40 MG tablet  Commonly known as:  PRINIVIL,ZESTRIL  TAKE 1/2 TABLET DAILY     LORazepam 0.5 MG tablet  Commonly known as:  ATIVAN  Take 0.5 mg by mouth.     metFORMIN 750 MG 24 hr tablet  Commonly known as:  GLUCOPHAGE-XR  TAKE 2 TABLETS ONCE DAILY     niacin 500 MG tablet  Commonly known as:  SLO-NIACIN  Take 500 mg by mouth daily. 3 tablets daily     prochlorperazine 10 MG tablet  Commonly known as:  COMPAZINE  Take 10 mg by mouth.     Vitamin D (Ergocalciferol) 50000 UNITS Caps  capsule  Commonly known as:  DRISDOL  TAKE 1 CAPSULE WEEKLY        Allergies: No Known Allergies  No past medical history on file.  No past surgical history on file.  Family History  Problem Relation Age of Onset  . Hypertension Mother   . Heart disease Father 49    Expired  . Diabetes Neg Hx     Social History:  reports that she has never smoked. She has never used smokeless tobacco. Her alcohol and drug histories are not on file.  Review of Systems  She is getting radiation and chemotherapy every 3 weeks for cervical cancer, does get dexamethasone  Alpha lipoic acid has been prescribed for chemotherapy related neuropathic symptoms. She does not think symptoms are severe   EXAM:  BP 116/66  Pulse 114  Temp(Src) 98.6 F (37 C) (Oral)  Ht 5\' 3"  (1.6 m)  SpO2 90%  No pedal edema  Assessment/Plan:    History of mild diabetes, previously on  metformin. Again she has lost significant amount of weight. However since she is eating dexamethasone periodically will check her glucose today and decide on the need for any medications. 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 Will leave off lisinopril and she can have her blood pressure checked periodically when she goes for chemotherapy. She will call if blood pressure is high  History of vitamin D deficiency  Brandy Hardy 12/29/2013, 8:22 AM

## 2013-12-29 NOTE — Patient Instructions (Signed)
Stop Lisinopril.

## 2013-12-30 NOTE — Progress Notes (Signed)
Quick Note:  Please let patient know that the glucose result is borderline at 100, no need for any treatment at this time ______

## 2014-02-28 ENCOUNTER — Other Ambulatory Visit: Payer: Self-pay

## 2014-02-28 DIAGNOSIS — Z1231 Encounter for screening mammogram for malignant neoplasm of breast: Secondary | ICD-10-CM

## 2014-03-27 ENCOUNTER — Ambulatory Visit: Payer: 59

## 2014-04-03 ENCOUNTER — Encounter: Payer: Self-pay | Admitting: Endocrinology

## 2014-04-03 ENCOUNTER — Ambulatory Visit (INDEPENDENT_AMBULATORY_CARE_PROVIDER_SITE_OTHER): Payer: 59 | Admitting: Endocrinology

## 2014-04-03 VITALS — BP 125/66 | HR 74 | Temp 98.0°F | Resp 14 | Ht 63.0 in | Wt 282.0 lb

## 2014-04-03 DIAGNOSIS — E785 Hyperlipidemia, unspecified: Secondary | ICD-10-CM

## 2014-04-03 DIAGNOSIS — I10 Essential (primary) hypertension: Secondary | ICD-10-CM

## 2014-04-03 DIAGNOSIS — R7302 Impaired glucose tolerance (oral): Secondary | ICD-10-CM

## 2014-04-03 DIAGNOSIS — E559 Vitamin D deficiency, unspecified: Secondary | ICD-10-CM

## 2014-04-03 LAB — LIPID PANEL
CHOL/HDL RATIO: 4
Cholesterol: 157 mg/dL (ref 0–200)
HDL: 42.8 mg/dL (ref >39.00)
NonHDL: 114.2
TRIGLYCERIDES: 228 mg/dL — AB (ref 0.0–149.0)
VLDL: 45.6 mg/dL — ABNORMAL HIGH (ref 0.0–40.0)

## 2014-04-03 LAB — COMPREHENSIVE METABOLIC PANEL
ALBUMIN: 3.7 g/dL (ref 3.5–5.2)
ALT: 21 U/L (ref 0–35)
AST: 24 U/L (ref 0–37)
Alkaline Phosphatase: 67 U/L (ref 39–117)
BUN: 8 mg/dL (ref 6–23)
CALCIUM: 9.3 mg/dL (ref 8.4–10.5)
CHLORIDE: 105 meq/L (ref 96–112)
CO2: 31 mEq/L (ref 19–32)
Creatinine, Ser: 0.6 mg/dL (ref 0.4–1.2)
GFR: 104.87 mL/min (ref >60.00)
Glucose, Bld: 100 mg/dL — ABNORMAL HIGH (ref 70–99)
POTASSIUM: 3.7 meq/L (ref 3.5–5.1)
Sodium: 141 mEq/L (ref 135–145)
TOTAL PROTEIN: 6.4 g/dL (ref 6.0–8.3)
Total Bilirubin: 0.4 mg/dL (ref 0.2–1.2)

## 2014-04-03 LAB — VITAMIN D 25 HYDROXY (VIT D DEFICIENCY, FRACTURES): VITD: 31.4 ng/mL (ref 30.00–100.00)

## 2014-04-03 LAB — HEMOGLOBIN A1C: Hgb A1c MFr Bld: 5.5 % (ref 4.6–6.5)

## 2014-04-03 LAB — LDL CHOLESTEROL, DIRECT: LDL DIRECT: 78.5 mg/dL

## 2014-04-03 NOTE — Progress Notes (Signed)
Patient ID: Brandy Hardy, female   DOB: 03/30/1956, 58 y.o.   MRN: [id number]   Chief complaint:  Followup visit  History of Present Illness:  She has had multiple  problems as follows:   History of mild diabetes, initially diagnosed on glucose tolerance test. This had been very well controlled with 1500 mg metformin and usually upper normal A1c. She had been on metformin since 2009. Because of her symptoms of diarrhea and also weight loss metformin was stopped.  Follow-up glucose was 100 fasting and A1c was excellent at 5.5.  She does not exercise  because of her work schedule and knee pains. A1c has been in excellent range previously also.   She has lost more weight. Previously had gained weight on Megace.  She is trying to watch her diet and avoiding meats now  Hyperlipidemia with history of increase in LDL particle number. LDL well controlled  with a regimen of Lipitor and niacin, HDL improved but still low normal. Her LDL particle number was much improved on the medication regimen  Hypertension: This has been relatively mild and because of low normal blood pressure her lisinopril was stopped   History of vitamin D deficiency, taking weekly supplement  She does not see her primary care physician for regular preventive physical exams  She is asking about taking magnesium and alpha lipoic acid supplements that were prescribed by oncologist    Wt Readings from Last 3 Encounters:  04/03/14 282 lb (127.914 kg)  12/29/13 298 lb (135.172 kg)  10/28/13 303 lb (137.44 kg)   LABS:  Lab Results  Component Value Date   HGBA1C 5.5 10/28/2013   HGBA1C 5.9 07/01/2013   HGBA1C 5.9 02/21/2013   Lab Results  Component Value Date   MICROALBUR 8.1* 02/21/2013   LDLCALC 64 10/28/2013   CREATININE 0.7 12/29/2013      Medication List       This list is accurate as of: 04/03/14  8:26 AM.  Always use your most recent med list.               acetaminophen 500 MG tablet  Commonly  known as:  TYLENOL  Take 500 mg by mouth every 6 (six) hours as needed.     Alpha-Lipoic Acid 600 MG Caps  Take by mouth.     atorvastatin 40 MG tablet  Commonly known as:  LIPITOR  TAKE 1 TABLET DAILY     letrozole 2.5 MG tablet  Commonly known as:  FEMARA     LORazepam 0.5 MG tablet  Commonly known as:  ATIVAN  Take 0.5 mg by mouth.     niacin 500 MG tablet  Commonly known as:  SLO-NIACIN  Take 500 mg by mouth daily. 3 tablets daily     prochlorperazine 10 MG tablet  Commonly known as:  COMPAZINE  Take 10 mg by mouth.     Vitamin D (Ergocalciferol) 50000 UNITS Caps capsule  Commonly known as:  DRISDOL  TAKE 1 CAPSULE WEEKLY        Allergies: No Known Allergies  No past medical history on file.  No past surgical history on file.  Family History  Problem Relation Age of Onset  . Hypertension Mother   . Heart disease Father 49    Expired  . Diabetes Neg Hx     Social History:  reports that she has never smoked. She has never used smokeless tobacco. Her alcohol and drug histories are not on file.  Review of Systems  She is finished with her radiation and chemotherapy for cervical cancer  Alpha lipoic acid has been prescribed for chemotherapy related neuropathic symptoms.  Has less symptoms now in her fingers   EXAM:  BP 125/66 mmHg  Pulse 74  Temp(Src) 98 F (36.7 C)  Resp 14  Ht 5\' 3"  (1.6 m)  Wt 282 lb (127.914 kg)  BMI 49.97 kg/m2  SpO2 95%  No pedal edema  Assessment/Plan:    History of mild diabetes, previously on metformin. Again she has lost significant amount of weight.  Will check her labs today and decide on any further treatment.  Encouraged her to be as active as possible.   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   History of vitamin D deficiency, needs follow-up level  Brandy Hardy 04/03/2014, 8:26 AM

## 2014-04-03 NOTE — Patient Instructions (Signed)
Walk as tolerated

## 2014-04-05 NOTE — Progress Notes (Signed)
Quick Note:  Please let patient know that the labs are good, since cholesterol is slightly higher will need to continue Lipitor and vitamin D supplements  ______

## 2014-04-06 ENCOUNTER — Ambulatory Visit
Admission: RE | Admit: 2014-04-06 | Discharge: 2014-04-06 | Disposition: A | Discharge status: Home / Self Care | Payer: 59 | Source: Ambulatory Visit

## 2014-04-06 DIAGNOSIS — Z1231 Encounter for screening mammogram for malignant neoplasm of breast: Secondary | ICD-10-CM

## 2014-06-02 ENCOUNTER — Other Ambulatory Visit: Payer: Self-pay | Admitting: Endocrinology

## 2014-07-10 DIAGNOSIS — Z09 Encounter for follow-up examination after completed treatment for conditions other than malignant neoplasm: Secondary | ICD-10-CM | POA: Insufficient documentation

## 2014-08-30 ENCOUNTER — Other Ambulatory Visit: Payer: Self-pay | Admitting: Endocrinology

## 2014-09-06 ENCOUNTER — Ambulatory Visit (INDEPENDENT_AMBULATORY_CARE_PROVIDER_SITE_OTHER): Payer: Managed Care, Other (non HMO) | Admitting: Endocrinology

## 2014-09-06 ENCOUNTER — Encounter: Payer: Self-pay | Admitting: Endocrinology

## 2014-09-06 VITALS — BP 110/66 | HR 70 | Temp 98.0°F | Resp 16 | Ht 63.0 in | Wt 271.8 lb

## 2014-09-06 DIAGNOSIS — R7302 Impaired glucose tolerance (oral): Secondary | ICD-10-CM | POA: Diagnosis not present

## 2014-09-06 DIAGNOSIS — E559 Vitamin D deficiency, unspecified: Secondary | ICD-10-CM

## 2014-09-06 DIAGNOSIS — E785 Hyperlipidemia, unspecified: Secondary | ICD-10-CM

## 2014-09-06 LAB — HEMOGLOBIN A1C: HEMOGLOBIN A1C: 5.6 % (ref 4.6–6.5)

## 2014-09-06 LAB — COMPREHENSIVE METABOLIC PANEL
ALT: 18 U/L (ref 0–35)
AST: 19 U/L (ref 0–37)
Albumin: 3.7 g/dL (ref 3.5–5.2)
Alkaline Phosphatase: 80 U/L (ref 39–117)
BUN: 11 mg/dL (ref 6–23)
CHLORIDE: 102 meq/L (ref 96–112)
CO2: 32 meq/L (ref 19–32)
Calcium: 9.2 mg/dL (ref 8.4–10.5)
Creatinine, Ser: 0.61 mg/dL (ref 0.40–1.20)
GFR: 106.7 mL/min (ref >60.00)
GLUCOSE: 104 mg/dL — AB (ref 70–99)
Potassium: 3.6 mEq/L (ref 3.5–5.1)
Sodium: 138 mEq/L (ref 135–145)
Total Bilirubin: 0.6 mg/dL (ref 0.2–1.2)
Total Protein: 6.6 g/dL (ref 6.0–8.3)

## 2014-09-06 NOTE — Progress Notes (Signed)
Patient ID: Brandy Hardy, female   DOB: 07/26/1955, 59 y.o.   MRN: [id number]   Chief complaint:  Followup visit  History of Present Illness:  She has had multiple  problems as follows:   History of mild diabetes, initially diagnosed on glucose tolerance test. This had been very well controlled with 1500 mg metformin and usually upper normal A1c. She had been on metformin since 2009. Because of her symptoms of diarrhea and also weight loss metformin was stopped.  Follow-up glucose was 100 fasting and A1c was excellent at 5.5.  She does not exercise  because of her work schedule and knee pains. A1c has been in excellent range previously also.   She has lost more weight. Previously had gained weight on Megace.  She is trying to watch her diet and avoiding meats now  Hyperlipidemia with history of increase in LDL particle number. LDL well controlled  with a regimen of Lipitor and niacin, HDL improved but still low normal. Her LDL particle number was much improved on the medication regimen  Hypertension: This has been relatively mild and because of low normal blood pressure her lisinopril was stopped   History of vitamin D deficiency, taking weekly supplement  She does not see her primary care physician for regular preventive physical exams  She is asking about taking magnesium and alpha lipoic acid supplements that were prescribed by oncologist    Wt Readings from Last 3 Encounters:  09/06/14 271 lb 12.8 oz (123.288 kg)  04/03/14 282 lb (127.914 kg)  12/29/13 298 lb (135.172 kg)   LABS:  Lab Results  Component Value Date   HGBA1C 5.5 04/03/2014   HGBA1C 5.5 10/28/2013   HGBA1C 5.9 07/01/2013   Lab Results  Component Value Date   MICROALBUR 8.1* 02/21/2013   LDLCALC 64 10/28/2013   CREATININE 0.6 04/03/2014      Medication List       This list is accurate as of: 09/06/14  8:28 AM.  Always use your most recent med list.               acetaminophen 500 MG tablet   Commonly known as:  TYLENOL  Take 500 mg by mouth every 6 (six) hours as needed.     Alpha-Lipoic Acid 600 MG Caps  Take by mouth.     atorvastatin 40 MG tablet  Commonly known as:  LIPITOR  TAKE 1 TABLET DAILY     letrozole 2.5 MG tablet  Commonly known as:  FEMARA     LORazepam 0.5 MG tablet  Commonly known as:  ATIVAN  Take 0.5 mg by mouth.     niacin 500 MG tablet  Commonly known as:  SLO-NIACIN  Take 500 mg by mouth daily. 3 tablets daily     PROBIOTIC DAILY PO  Take by mouth.     prochlorperazine 10 MG tablet  Commonly known as:  COMPAZINE  Take 10 mg by mouth.     Vitamin D (Ergocalciferol) 50000 UNITS Caps capsule  Commonly known as:  DRISDOL  TAKE 1 CAPSULE WEEKLY        Allergies: No Known Allergies  No past medical history on file.  No past surgical history on file.  Family History  Problem Relation Age of Onset  . Hypertension Mother   . Heart disease Father 49    Expired  . Diabetes Neg Hx     Social History:  reports that she has never smoked. She has never used smokeless tobacco.  Her alcohol and drug histories are not on file.  Review of Systems  She is finished with her radiation and chemotherapy for cervical cancer  Alpha lipoic acid has been prescribed for chemotherapy related neuropathic symptoms.  Has less symptoms now in her fingers   EXAM:  BP 110/66 mmHg  Pulse 70  Temp(Src) 98 F (36.7 C)  Resp 16  Ht 5\' 3"  (1.6 m)  Wt 271 lb 12.8 oz (123.288 kg)  BMI 48.16 kg/m2  SpO2 95%  No pedal edema  Assessment/Plan:    History of mild diabetes, previously on metformin.  She continues to lose weight; on her previous assessment her blood sugars were normal without any medications.  Will check her labs including A1c and fasting glucose today and decide on any further treatment.  Encouraged her to start walking or other activities  Hypertension history: Blood pressure is  normal without any medications.   Hyperlipidemia:  Although her LDL was fairly good she still had relatively high triglycerides and low HDL even with taking Lipitor and niacin.  If her LDL is still excellent and triglycerides are high may consider switching to fenofibrate.  May do better with weight loss anyway and appears to be watching her diet consistently  History of vitamin D deficiency, needs to continue supplement as her level was still low normal on her last visit  Brandy Hardy 09/06/2014, 8:28 AM

## 2014-09-06 NOTE — Patient Instructions (Signed)
Low fat diet as before   Increase exercise

## 2014-09-06 NOTE — Progress Notes (Signed)
Quick Note:  Please let patient know that the A1c test indicates overall good sugars but blood sugar is slightly high at 104 Need to add lipid panel  ______

## 2015-02-25 ENCOUNTER — Other Ambulatory Visit: Payer: Self-pay | Admitting: Endocrinology

## 2015-02-27 ENCOUNTER — Other Ambulatory Visit (INDEPENDENT_AMBULATORY_CARE_PROVIDER_SITE_OTHER): Payer: Managed Care, Other (non HMO)

## 2015-02-27 ENCOUNTER — Encounter: Payer: Self-pay | Admitting: Endocrinology

## 2015-02-27 ENCOUNTER — Ambulatory Visit (INDEPENDENT_AMBULATORY_CARE_PROVIDER_SITE_OTHER): Payer: Managed Care, Other (non HMO) | Admitting: Endocrinology

## 2015-02-27 VITALS — BP 118/74 | HR 72 | Temp 98.6°F | Resp 14 | Ht 63.0 in | Wt 274.2 lb

## 2015-02-27 DIAGNOSIS — E559 Vitamin D deficiency, unspecified: Secondary | ICD-10-CM | POA: Diagnosis not present

## 2015-02-27 DIAGNOSIS — R7302 Impaired glucose tolerance (oral): Secondary | ICD-10-CM | POA: Diagnosis not present

## 2015-02-27 DIAGNOSIS — R7303 Prediabetes: Secondary | ICD-10-CM | POA: Diagnosis not present

## 2015-02-27 DIAGNOSIS — E785 Hyperlipidemia, unspecified: Secondary | ICD-10-CM

## 2015-02-27 LAB — LIPID PANEL
CHOL/HDL RATIO: 3
Cholesterol: 153 mg/dL (ref 0–200)
HDL: 46 mg/dL (ref >39.00)
LDL CALC: 72 mg/dL (ref 0–99)
NonHDL: 107.33
Triglycerides: 179 mg/dL — ABNORMAL HIGH (ref 0.0–149.0)
VLDL: 35.8 mg/dL (ref 0.0–40.0)

## 2015-02-27 LAB — VITAMIN D 25 HYDROXY (VIT D DEFICIENCY, FRACTURES): VITD: 34.61 ng/mL (ref 30.00–100.00)

## 2015-02-27 LAB — COMPREHENSIVE METABOLIC PANEL
ALBUMIN: 3.6 g/dL (ref 3.5–5.2)
ALT: 18 U/L (ref 0–35)
AST: 18 U/L (ref 0–37)
Alkaline Phosphatase: 84 U/L (ref 39–117)
BILIRUBIN TOTAL: 0.5 mg/dL (ref 0.2–1.2)
BUN: 13 mg/dL (ref 6–23)
CHLORIDE: 103 meq/L (ref 96–112)
CO2: 32 meq/L (ref 19–32)
CREATININE: 0.52 mg/dL (ref 0.40–1.20)
Calcium: 9 mg/dL (ref 8.4–10.5)
GFR: 128.07 mL/min (ref >60.00)
Glucose, Bld: 94 mg/dL (ref 70–99)
Potassium: 3.8 mEq/L (ref 3.5–5.1)
SODIUM: 141 meq/L (ref 135–145)
Total Protein: 6.4 g/dL (ref 6.0–8.3)

## 2015-02-27 LAB — HEMOGLOBIN A1C: Hgb A1c MFr Bld: 5.7 % (ref 4.6–6.5)

## 2015-02-27 NOTE — Progress Notes (Signed)
Quick Note:  Please let patient know that A1c, lipids, vitamin D and glucose normal, good cholesterol improved and can stop niacin No other changes ______

## 2015-02-27 NOTE — Patient Instructions (Signed)
Start walking regimen daily

## 2015-02-27 NOTE — Progress Notes (Signed)
Patient ID: Brandy Hardy, female   DOB: [birth year], 59 y.o.   MRN: [id number]   Chief complaint:  Six-month Followup visit  History of Present Illness:  She has had multiple  problems as follows:   History of mild diabetes, initially diagnosed on glucose tolerance test. This had been very well controlled with 1500 mg metformin and usually upper normal A1c. She had been on metformin since 2009.  Because of her symptoms of diarrhea and also weight loss metformin was stopped.   Subsequently her glucose levels and A1c have been normal On her last visit fasting glucose was 104 but A1c normal She does not exercise much because of work schedule and recent fatigue from radiation treatments  Lab Results  Component Value Date   HGBA1C 5.7 02/27/2015   HGBA1C 5.6 09/06/2014   HGBA1C 5.5 04/03/2014   Lab Results  Component Value Date   MICROALBUR 8.1* 02/21/2013   LDLCALC 72 02/27/2015   CREATININE 0.52 02/27/2015      OBESITY: She has not lost more weight. Previously had gained weight on Megace.  She is trying to watch her diet and avoiding meats.  However as above not able to exercise much and also does not find time to do this  Wt Readings from Last 3 Encounters:  02/27/15 274 lb 3.2 oz (124.376 kg)  09/06/14 271 lb 12.8 oz (123.288 kg)  04/03/14 282 lb (127.914 kg)      Hyperlipidemia with history of increase in LDL particle number. LDL well controlled  with a regimen of Lipitor and niacin 1g in am, HDL improved . Her LDL particle number was much improved on the medication regimen  Lab Results  Component Value Date   CHOL 153 02/27/2015   HDL 46.00 02/27/2015   LDLCALC 72 02/27/2015   LDLDIRECT 78.5 04/03/2014   TRIG 179.0* 02/27/2015   CHOLHDL 3 02/27/2015     Hypertension history: This previously had been relatively mild and because of low normal blood pressure her lisinopril was stopped   History of vitamin D deficiency, taking weekly supplement       Medication List       This list is accurate as of: 02/27/15  9:33 PM.  Always use your most recent med list.               acetaminophen 500 MG tablet  Commonly known as:  TYLENOL  Take 500 mg by mouth every 6 (six) hours as needed.     atorvastatin 40 MG tablet  Commonly known as:  LIPITOR  TAKE 1 TABLET DAILY     niacin 500 MG tablet  Commonly known as:  SLO-NIACIN  Take 500 mg by mouth daily. 3 tablets daily     PROBIOTIC DAILY PO  Take by mouth.     Vitamin D (Ergocalciferol) 50000 UNITS Caps capsule  Commonly known as:  DRISDOL  TAKE 1 CAPSULE WEEKLY        Allergies: No Known Allergies  No past medical history on file.  No past surgical history on file.  Family History  Problem Relation Age of Onset  . Hypertension Mother   . Heart disease Father 49    Expired  . Diabetes Neg Hx     Social History:  reports that she has never smoked. She has never used smokeless tobacco. Her alcohol and drug histories are not on file.  Review of Systems  She is finished with her radiation and chemotherapy for cervical cancer, did  have fatigue with radiation and just getting better now  No symptoms of numbness in her feet currently  EXAM:  BP 118/74 mmHg  Pulse 72  Temp(Src) 98.6 F (37 C)  Resp 14  Ht 5\' 3"  (1.6 m)  Wt 274 lb 3.2 oz (124.376 kg)  BMI 48.58 kg/m2  SpO2 96%  No pedal edema Diabetic foot exam shows normal monofilament sensation in the toes and plantar surfaces, no skin lesions or ulcers on the feet and normal pedal pulses   Assessment/Plan:    History of mild diabetes, previously on metformin.  Her A1c has been normal without metformin and has benefited significantly from weight loss.  Last fasting glucose was 104.  Will need to reassess her labs again.  However has not lost any weight since her last visit. Encouraged her to start walking again since she is subjectively feeling better now   Hypertension history: Blood pressure is   normal without any medications.   Hyperlipidemia: Will reassess this.  Currently on Lipitor and niacin 500 mg twice a day and was not able to stop medications even with her weight loss   History of vitamin D deficiency, needs to continue supplement and recheck labs  [facility] 02/27/2015, 9:33 PM    Addendum: Labs as follows:  A1c and glucose normal, HDL improved and can stop niacin  Lab on 02/27/2015  Component Date Value Ref Range Status  . Hgb A1c MFr Bld 02/27/2015 5.7  4.6 - 6.5 % Final   Glycemic Control Guidelines for People with Diabetes:Non Diabetic:  <6%Goal of Therapy: <7%Additional Action Suggested:  >8%   . Sodium 02/27/2015 141  135 - 145 mEq/L Final  . Potassium 02/27/2015 3.8  3.5 - 5.1 mEq/L Final  . Chloride 02/27/2015 103  96 - 112 mEq/L Final  . CO2 02/27/2015 32  19 - 32 mEq/L Final  . Glucose, Bld 02/27/2015 94  70 - 99 mg/dL Final  . BUN 47/82/9562 13  6 - 23 mg/dL Final  . Creatinine, Ser 02/27/2015 0.52  0.40 - 1.20 mg/dL Final  . Total Bilirubin 02/27/2015 0.5  0.2 - 1.2 mg/dL Final  . Alkaline Phosphatase 02/27/2015 84  39 - 117 U/L Final  . AST 02/27/2015 18  0 - 37 U/L Final  . ALT 02/27/2015 18  0 - 35 U/L Final  . Total Protein 02/27/2015 6.4  6.0 - 8.3 g/dL Final  . Albumin 13/11/6576 3.6  3.5 - 5.2 g/dL Final  . Calcium 46/96/2952 9.0  8.4 - 10.5 mg/dL Final  . GFR 84/13/2440 128.07  >60.00 mL/min Final  . Cholesterol 02/27/2015 153  0 - 200 mg/dL Final   ATP III Classification       Desirable:  < 200 mg/dL               Borderline High:  200 - 239 mg/dL          High:  > = 102 mg/dL  . Triglycerides 02/27/2015 179.0* 0.0 - 149.0 mg/dL Final   Normal:  <725 mg/dLBorderline High:  150 - 199 mg/dL  . HDL 02/27/2015 46.00  >39.00 mg/dL Final  . VLDL 36/64/4034 35.8  0.0 - 40.0 mg/dL Final  . LDL Cholesterol 02/27/2015 72  0 - 99 mg/dL Final  . Total CHOL/HDL Ratio 02/27/2015 3   Final                  Men          Women1/2 Average  Risk     3.4           3.3Average Risk          5.0          4.42X Average Risk          9.6          7.13X Average Risk          15.0          11.0                      . NonHDL 02/27/2015 107.33   Final   NOTE:  Non-HDL goal should be 30 mg/dL higher than patient's LDL goal (i.e. LDL goal of < 70 mg/dL, would have non-HDL goal of < 100 mg/dL)  . VITD 02/27/2015 34.61  30.00 - 100.00 ng/mL Final

## 2015-05-25 ENCOUNTER — Other Ambulatory Visit: Payer: Self-pay | Admitting: Endocrinology

## 2015-06-04 DIAGNOSIS — N766 Ulceration of vulva: Secondary | ICD-10-CM | POA: Insufficient documentation

## 2015-09-04 ENCOUNTER — Ambulatory Visit: Payer: Managed Care, Other (non HMO) | Admitting: Endocrinology

## 2015-10-08 ENCOUNTER — Ambulatory Visit (INDEPENDENT_AMBULATORY_CARE_PROVIDER_SITE_OTHER): Payer: Managed Care, Other (non HMO) | Admitting: Endocrinology

## 2015-10-08 ENCOUNTER — Encounter: Payer: Self-pay | Admitting: Endocrinology

## 2015-10-08 VITALS — BP 116/62 | HR 67 | Ht 63.0 in | Wt 270.0 lb

## 2015-10-08 DIAGNOSIS — E559 Vitamin D deficiency, unspecified: Secondary | ICD-10-CM | POA: Diagnosis not present

## 2015-10-08 DIAGNOSIS — R7303 Prediabetes: Secondary | ICD-10-CM

## 2015-10-08 DIAGNOSIS — E785 Hyperlipidemia, unspecified: Secondary | ICD-10-CM | POA: Diagnosis not present

## 2015-10-08 LAB — LIPID PANEL
Cholesterol: 177 mg/dL (ref 0–200)
HDL: 54.8 mg/dL (ref >39.00)
LDL Cholesterol: 82 mg/dL (ref 0–99)
NONHDL: 121.77
TRIGLYCERIDES: 197 mg/dL — AB (ref 0.0–149.0)
Total CHOL/HDL Ratio: 3
VLDL: 39.4 mg/dL (ref 0.0–40.0)

## 2015-10-08 LAB — HEMOGLOBIN A1C: Hgb A1c MFr Bld: 5.6 % (ref 4.6–6.5)

## 2015-10-08 LAB — COMPREHENSIVE METABOLIC PANEL
ALBUMIN: 3.9 g/dL (ref 3.5–5.2)
ALK PHOS: 81 U/L (ref 39–117)
ALT: 17 U/L (ref 0–35)
AST: 15 U/L (ref 0–37)
BILIRUBIN TOTAL: 0.6 mg/dL (ref 0.2–1.2)
BUN: 16 mg/dL (ref 6–23)
CALCIUM: 9.3 mg/dL (ref 8.4–10.5)
CO2: 32 mEq/L (ref 19–32)
CREATININE: 0.59 mg/dL (ref 0.40–1.20)
Chloride: 103 mEq/L (ref 96–112)
GFR: 110.47 mL/min (ref >60.00)
Glucose, Bld: 99 mg/dL (ref 70–99)
Potassium: 4 mEq/L (ref 3.5–5.1)
Sodium: 137 mEq/L (ref 135–145)
TOTAL PROTEIN: 6.7 g/dL (ref 6.0–8.3)

## 2015-10-08 LAB — MICROALBUMIN / CREATININE URINE RATIO
CREATININE, U: 78.3 mg/dL
MICROALB/CREAT RATIO: 4.3 mg/g (ref 0.0–30.0)
Microalb, Ur: 3.4 mg/dL — ABNORMAL HIGH (ref 0.0–1.9)

## 2015-10-08 NOTE — Progress Notes (Signed)
Patient ID: Brandy Hardy, female   DOB: 08/12/1955, 60 y.o.   MRN: [id number]   Chief complaint:  Six-month Followup visit  History of Present Illness:  She has had multiple  problems as follows:   History of mild diabetes, initially diagnosed on glucose tolerance test. This had been very well controlled with 1500 mg metformin and usually upper normal A1c. She had been on metformin since 2009.   Because of her symptoms of diarrhea and also weight loss metformin was stopped subsequently.   Subsequently her glucose levels and A1c have been normal On her last visit fasting glucose was below 100 A1c has been again consistently normal and only borderline for prediabetes without her metformin now Weight is slightly better, she is able to be little more active  Lab Results  Component Value Date   HGBA1C 5.7 02/27/2015   HGBA1C 5.6 09/06/2014   HGBA1C 5.5 04/03/2014   Lab Results  Component Value Date   MICROALBUR 8.1* 02/21/2013   LDLCALC 72 02/27/2015   CREATININE 0.52 02/27/2015      OBESITY: She has lost more weight. Previously had gained weight on Megace.  She is trying to watch her diet and avoiding High-fat foods.  Recently has less fatigue and is trying to be a little more active   Wt Readings from Last 3 Encounters:  10/08/15 270 lb (122.471 kg)  02/27/15 274 lb 3.2 oz (124.376 kg)  09/06/14 271 lb 12.8 oz (123.288 kg)      Hyperlipidemia with history of increase in LDL particle number. LDL well controlled  with a regimen of Lipitor 40 mg alone, previously on niacin  . Her LDL particle number was much improved on the medication regimen  Lab Results  Component Value Date   CHOL 153 02/27/2015   HDL 46.00 02/27/2015   LDLCALC 72 02/27/2015   LDLDIRECT 78.5 04/03/2014   TRIG 179.0* 02/27/2015   CHOLHDL 3 02/27/2015     Hypertension history: This previously had been relatively mild and because of low normal blood pressure her lisinopril was stopped     History of vitamin D deficiency, taking weekly supplement  Lab Results  Component Value Date   VD25OH 34.61 02/27/2015   VD25OH 31.40 04/03/2014          Medication List       This list is accurate as of: 10/08/15 10:34 AM.  Always use your most recent med list.               atorvastatin 40 MG tablet  Commonly known as:  LIPITOR  TAKE 1 TABLET DAILY     celecoxib 200 MG capsule  Commonly known as:  CELEBREX     PROBIOTIC DAILY PO  Take by mouth.     Vitamin D (Ergocalciferol) 50000 units Caps capsule  Commonly known as:  DRISDOL  TAKE 1 CAPSULE WEEKLY        Allergies: No Known Allergies  No past medical history on file.  No past surgical history on file.  Family History  Problem Relation Age of Onset  . Hypertension Mother   . Heart disease Father 49    Expired  . Diabetes Neg Hx     Social History:  reports that she has never smoked. She has never used smokeless tobacco. Her alcohol and drug histories are not on file.  Review of Systems  She is finished with her radiation and chemotherapy for cervical cancer, did have fatigue with radiation and just  getting better now  Continued mild symptoms of numbness in her feet currently  EXAM:  BP 116/62 mmHg  Pulse 67  Ht 5\' 3"  (1.6 m)  Wt 270 lb (122.471 kg)  BMI 47.84 kg/m2  No pedal edema present  Assessment/Plan:    History of mild diabetes, previously on metformin.  Her A1c has been normal without metformin and has benefited significantly from weight loss.  Last fasting glucose was 94  And A1c was 5.7 indicating borderline prediabetes.  Will need to reassess her labs again.    She is starting to lose a little weight and encouraged her to be more active for further weight loss   Hypertension history: Blood pressure is  again normal without any medications.   Hyperlipidemia: Will reassess this.  Currently on Lipitor  40 mg    History of vitamin D deficiency, needs to continue supplement  and recheck labs annually  [facility] 10/08/2015, 10:34 AM

## 2015-10-08 NOTE — Progress Notes (Signed)
Quick Note:  Please let patient know that the lab tests are within range, no change in atorvastatin ______

## 2015-10-12 ENCOUNTER — Telehealth: Payer: Self-pay

## 2015-10-12 NOTE — Telephone Encounter (Signed)
Called and left message on normal results and no change in medications. Advised patient to call back if any questions or concerns.

## 2015-11-21 ENCOUNTER — Other Ambulatory Visit: Payer: Self-pay | Admitting: Endocrinology

## 2016-02-14 ENCOUNTER — Encounter (HOSPITAL_COMMUNITY): Payer: Self-pay | Admitting: Emergency Medicine

## 2016-02-14 ENCOUNTER — Inpatient Hospital Stay (HOSPITAL_COMMUNITY): Payer: Managed Care, Other (non HMO)

## 2016-02-14 ENCOUNTER — Inpatient Hospital Stay (HOSPITAL_COMMUNITY)
Admission: EM | Admit: 2016-02-14 | Discharge: 2016-02-22 | DRG: 853 | Disposition: A | Discharge status: Rehabilitation Facility | Payer: Managed Care, Other (non HMO) | Attending: Internal Medicine | Admitting: Internal Medicine

## 2016-02-14 ENCOUNTER — Emergency Department (HOSPITAL_COMMUNITY): Payer: Managed Care, Other (non HMO)

## 2016-02-14 DIAGNOSIS — B9689 Other specified bacterial agents as the cause of diseases classified elsewhere: Secondary | ICD-10-CM | POA: Diagnosis not present

## 2016-02-14 DIAGNOSIS — B372 Candidiasis of skin and nail: Secondary | ICD-10-CM | POA: Diagnosis present

## 2016-02-14 DIAGNOSIS — R571 Hypovolemic shock: Secondary | ICD-10-CM | POA: Diagnosis present

## 2016-02-14 DIAGNOSIS — Z90710 Acquired absence of both cervix and uterus: Secondary | ICD-10-CM

## 2016-02-14 DIAGNOSIS — Z79899 Other long term (current) drug therapy: Secondary | ICD-10-CM | POA: Diagnosis not present

## 2016-02-14 DIAGNOSIS — E872 Acidosis: Secondary | ICD-10-CM | POA: Diagnosis present

## 2016-02-14 DIAGNOSIS — Z8542 Personal history of malignant neoplasm of other parts of uterus: Secondary | ICD-10-CM | POA: Diagnosis not present

## 2016-02-14 DIAGNOSIS — E861 Hypovolemia: Secondary | ICD-10-CM | POA: Diagnosis present

## 2016-02-14 DIAGNOSIS — D62 Acute posthemorrhagic anemia: Secondary | ICD-10-CM | POA: Diagnosis present

## 2016-02-14 DIAGNOSIS — Z6841 Body Mass Index (BMI) 40.0 and over, adult: Secondary | ICD-10-CM | POA: Diagnosis not present

## 2016-02-14 DIAGNOSIS — L02214 Cutaneous abscess of groin: Secondary | ICD-10-CM | POA: Diagnosis present

## 2016-02-14 DIAGNOSIS — R5081 Fever presenting with conditions classified elsewhere: Secondary | ICD-10-CM | POA: Diagnosis not present

## 2016-02-14 DIAGNOSIS — L02415 Cutaneous abscess of right lower limb: Secondary | ICD-10-CM | POA: Diagnosis present

## 2016-02-14 DIAGNOSIS — E43 Unspecified severe protein-calorie malnutrition: Secondary | ICD-10-CM | POA: Diagnosis present

## 2016-02-14 DIAGNOSIS — D638 Anemia in other chronic diseases classified elsewhere: Secondary | ICD-10-CM | POA: Diagnosis present

## 2016-02-14 DIAGNOSIS — D72820 Lymphocytosis (symptomatic): Secondary | ICD-10-CM

## 2016-02-14 DIAGNOSIS — A419 Sepsis, unspecified organism: Secondary | ICD-10-CM | POA: Diagnosis present

## 2016-02-14 DIAGNOSIS — R5381 Other malaise: Secondary | ICD-10-CM | POA: Diagnosis not present

## 2016-02-14 DIAGNOSIS — Z923 Personal history of irradiation: Secondary | ICD-10-CM

## 2016-02-14 DIAGNOSIS — N179 Acute kidney failure, unspecified: Secondary | ICD-10-CM | POA: Diagnosis present

## 2016-02-14 DIAGNOSIS — Z9221 Personal history of antineoplastic chemotherapy: Secondary | ICD-10-CM

## 2016-02-14 DIAGNOSIS — E669 Obesity, unspecified: Secondary | ICD-10-CM

## 2016-02-14 DIAGNOSIS — E860 Dehydration: Secondary | ICD-10-CM | POA: Diagnosis present

## 2016-02-14 DIAGNOSIS — M726 Necrotizing fasciitis: Secondary | ICD-10-CM | POA: Diagnosis present

## 2016-02-14 DIAGNOSIS — R6520 Severe sepsis without septic shock: Secondary | ICD-10-CM | POA: Diagnosis not present

## 2016-02-14 DIAGNOSIS — N390 Urinary tract infection, site not specified: Secondary | ICD-10-CM | POA: Diagnosis present

## 2016-02-14 DIAGNOSIS — R079 Chest pain, unspecified: Secondary | ICD-10-CM | POA: Diagnosis not present

## 2016-02-14 DIAGNOSIS — N10 Acute tubulo-interstitial nephritis: Secondary | ICD-10-CM | POA: Diagnosis not present

## 2016-02-14 DIAGNOSIS — R6521 Severe sepsis with septic shock: Secondary | ICD-10-CM

## 2016-02-14 DIAGNOSIS — R509 Fever, unspecified: Secondary | ICD-10-CM | POA: Diagnosis present

## 2016-02-14 DIAGNOSIS — I10 Essential (primary) hypertension: Secondary | ICD-10-CM | POA: Diagnosis present

## 2016-02-14 DIAGNOSIS — E876 Hypokalemia: Secondary | ICD-10-CM | POA: Diagnosis present

## 2016-02-14 DIAGNOSIS — R748 Abnormal levels of other serum enzymes: Secondary | ICD-10-CM | POA: Diagnosis not present

## 2016-02-14 DIAGNOSIS — Z8541 Personal history of malignant neoplasm of cervix uteri: Secondary | ICD-10-CM | POA: Diagnosis not present

## 2016-02-14 DIAGNOSIS — B966 Bacteroides fragilis [B. fragilis] as the cause of diseases classified elsewhere: Secondary | ICD-10-CM | POA: Diagnosis present

## 2016-02-14 DIAGNOSIS — R0682 Tachypnea, not elsewhere classified: Secondary | ICD-10-CM

## 2016-02-14 HISTORY — DX: Malignant neoplasm of vagina: C52

## 2016-02-14 HISTORY — DX: Personal history of other specified conditions: Z87.898

## 2016-02-14 HISTORY — DX: Malignant neoplasm of endometrium: C54.1

## 2016-02-14 HISTORY — DX: Malignant neoplasm of cervix uteri, unspecified: C53.9

## 2016-02-14 LAB — COMPREHENSIVE METABOLIC PANEL
ALK PHOS: 118 U/L (ref 38–126)
ALT: 46 U/L (ref 14–54)
AST: 83 U/L — ABNORMAL HIGH (ref 15–41)
Albumin: 1.6 g/dL — ABNORMAL LOW (ref 3.5–5.0)
Anion gap: 15 (ref 5–15)
BUN: 20 mg/dL (ref 6–20)
CALCIUM: 7.8 mg/dL — AB (ref 8.9–10.3)
CO2: 26 mmol/L (ref 22–32)
CREATININE: 1.14 mg/dL — AB (ref 0.44–1.00)
Chloride: 94 mmol/L — ABNORMAL LOW (ref 101–111)
GFR, EST AFRICAN AMERICAN: 59 mL/min — AB (ref >60)
GFR, EST NON AFRICAN AMERICAN: 51 mL/min — AB (ref >60)
Glucose, Bld: 108 mg/dL — ABNORMAL HIGH (ref 65–99)
Potassium: 2.4 mmol/L — CL (ref 3.5–5.1)
Sodium: 135 mmol/L (ref 135–145)
TOTAL PROTEIN: 5.7 g/dL — AB (ref 6.5–8.1)
Total Bilirubin: 0.9 mg/dL (ref 0.3–1.2)

## 2016-02-14 LAB — CBC WITH DIFFERENTIAL/PLATELET
Basophils Absolute: 0 10*3/uL (ref 0.0–0.1)
Basophils Relative: 0 %
EOS ABS: 0 10*3/uL (ref 0.0–0.7)
Eosinophils Relative: 0 %
HCT: 34.5 % — ABNORMAL LOW (ref 36.0–46.0)
Hemoglobin: 11.5 g/dL — ABNORMAL LOW (ref 12.0–15.0)
Lymphocytes Relative: 2 %
Lymphs Abs: 0.4 10*3/uL — ABNORMAL LOW (ref 0.7–4.0)
MCH: 30.8 pg (ref 26.0–34.0)
MCHC: 33.3 g/dL (ref 30.0–36.0)
MCV: 92.5 fL (ref 78.0–100.0)
MONO ABS: 0.6 10*3/uL (ref 0.1–1.0)
Monocytes Relative: 3 %
NEUTROS PCT: 95 %
Neutro Abs: 19.9 10*3/uL — ABNORMAL HIGH (ref 1.7–7.7)
PLATELETS: 287 10*3/uL (ref 150–400)
RBC: 3.73 MIL/uL — AB (ref 3.87–5.11)
RDW: 14.5 % (ref 11.5–15.5)
WBC Morphology: INCREASED
WBC: 20.9 10*3/uL — AB (ref 4.0–10.5)

## 2016-02-14 LAB — LACTIC ACID, PLASMA
LACTIC ACID, VENOUS: 1.6 mmol/L (ref 0.5–1.9)
LACTIC ACID, VENOUS: 1.2 mmol/L (ref 0.5–1.9)

## 2016-02-14 LAB — I-STAT CG4 LACTIC ACID, ED
Lactic Acid, Venous: 4.72 mmol/L (ref 0.5–1.9)
Lactic Acid, Venous: 2.16 mmol/L (ref 0.5–1.9)

## 2016-02-14 LAB — URINALYSIS, ROUTINE W REFLEX MICROSCOPIC
GLUCOSE, UA: NEGATIVE mg/dL
KETONES UR: 15 mg/dL — AB
NITRITE: POSITIVE — AB
PROTEIN: 100 mg/dL — AB
Specific Gravity, Urine: 1.022 (ref 1.005–1.030)
pH: 5 (ref 5.0–8.0)

## 2016-02-14 LAB — BASIC METABOLIC PANEL
ANION GAP: 9 (ref 5–15)
BUN: 15 mg/dL (ref 6–20)
CHLORIDE: 98 mmol/L — AB (ref 101–111)
CO2: 26 mmol/L (ref 22–32)
Calcium: 6.9 mg/dL — ABNORMAL LOW (ref 8.9–10.3)
Creatinine, Ser: 0.84 mg/dL (ref 0.44–1.00)
GFR calc Af Amer: >60 mL/min (ref >60)
GFR calc non Af Amer: >60 mL/min (ref >60)
GLUCOSE: 166 mg/dL — AB (ref 65–99)
POTASSIUM: 2.9 mmol/L — AB (ref 3.5–5.1)
Sodium: 133 mmol/L — ABNORMAL LOW (ref 135–145)

## 2016-02-14 LAB — MAGNESIUM
Magnesium: 1.9 mg/dL (ref 1.7–2.4)
Magnesium: 1.7 mg/dL (ref 1.7–2.4)

## 2016-02-14 LAB — PHOSPHORUS: Phosphorus: 1.6 mg/dL — ABNORMAL LOW (ref 2.5–4.6)

## 2016-02-14 LAB — TROPONIN I
TROPONIN I: 0.03 ng/mL — AB (ref <0.03)
Troponin I: 0.05 ng/mL (ref <0.03)
Troponin I: 0.11 ng/mL (ref <0.03)

## 2016-02-14 LAB — CBC
HEMATOCRIT: 31.4 % — AB (ref 36.0–46.0)
HEMOGLOBIN: 10.4 g/dL — AB (ref 12.0–15.0)
MCH: 30.5 pg (ref 26.0–34.0)
MCHC: 33.1 g/dL (ref 30.0–36.0)
MCV: 92.1 fL (ref 78.0–100.0)
Platelets: 302 10*3/uL (ref 150–400)
RBC: 3.41 MIL/uL — AB (ref 3.87–5.11)
RDW: 14.3 % (ref 11.5–15.5)
WBC: 22.9 10*3/uL — ABNORMAL HIGH (ref 4.0–10.5)

## 2016-02-14 LAB — URINE MICROSCOPIC-ADD ON

## 2016-02-14 LAB — MRSA PCR SCREENING: MRSA by PCR: NEGATIVE

## 2016-02-14 LAB — CORTISOL: Cortisol, Plasma: 40.9 ug/dL

## 2016-02-14 MED ORDER — POTASSIUM CHLORIDE CRYS ER 20 MEQ PO TBCR
40.0000 meq | EXTENDED_RELEASE_TABLET | Freq: Once | ORAL | Status: AC
  Administered 2016-02-14: 40 meq via ORAL
  Filled 2016-02-14: qty 2

## 2016-02-14 MED ORDER — HEPARIN SODIUM (PORCINE) 5000 UNIT/ML IJ SOLN
5000.0000 [IU] | Freq: Three times a day (TID) | INTRAMUSCULAR | Status: DC
  Administered 2016-02-14 – 2016-02-22 (×22): 5000 [IU] via SUBCUTANEOUS
  Filled 2016-02-14 (×22): qty 1

## 2016-02-14 MED ORDER — NOREPINEPHRINE BITARTRATE 1 MG/ML IV SOLN
0.0000 ug/min | INTRAVENOUS | Status: DC
  Administered 2016-02-14: 3 ug/min via INTRAVENOUS
  Filled 2016-02-14 (×2): qty 4

## 2016-02-14 MED ORDER — SODIUM CHLORIDE 0.9 % IV BOLUS (SEPSIS)
1000.0000 mL | Freq: Once | INTRAVENOUS | Status: AC
  Administered 2016-02-14: 1000 mL via INTRAVENOUS

## 2016-02-14 MED ORDER — PIPERACILLIN-TAZOBACTAM 3.375 G IVPB
3.3750 g | Freq: Three times a day (TID) | INTRAVENOUS | Status: DC
  Administered 2016-02-14 – 2016-02-22 (×26): 3.375 g via INTRAVENOUS
  Filled 2016-02-14 (×28): qty 50

## 2016-02-14 MED ORDER — VANCOMYCIN HCL IN DEXTROSE 1-5 GM/200ML-% IV SOLN
1000.0000 mg | Freq: Once | INTRAVENOUS | Status: AC
  Administered 2016-02-14: 1000 mg via INTRAVENOUS
  Filled 2016-02-14: qty 200

## 2016-02-14 MED ORDER — FLUCONAZOLE IN SODIUM CHLORIDE 200-0.9 MG/100ML-% IV SOLN
200.0000 mg | INTRAVENOUS | Status: DC
  Administered 2016-02-14 – 2016-02-17 (×4): 200 mg via INTRAVENOUS
  Filled 2016-02-14 (×5): qty 100

## 2016-02-14 MED ORDER — SODIUM CHLORIDE 0.9 % IV SOLN: 250.0000 mL | INTRAVENOUS | Status: DC | PRN

## 2016-02-14 MED ORDER — SODIUM CHLORIDE 0.9 % IV SOLN
30.0000 meq | Freq: Once | INTRAVENOUS | Status: AC
  Administered 2016-02-14: 30 meq via INTRAVENOUS
  Filled 2016-02-14: qty 15

## 2016-02-14 MED ORDER — FENTANYL CITRATE (PF) 100 MCG/2ML IJ SOLN
INTRAMUSCULAR | Status: AC
  Administered 2016-02-14: 25 ug via INTRAVENOUS
  Filled 2016-02-14: qty 2

## 2016-02-14 MED ORDER — FENTANYL CITRATE (PF) 100 MCG/2ML IJ SOLN
25.0000 ug | INTRAMUSCULAR | Status: DC | PRN
  Administered 2016-02-14 (×2): 25 ug via INTRAVENOUS
  Filled 2016-02-14: qty 2

## 2016-02-14 MED ORDER — PIPERACILLIN-TAZOBACTAM 3.375 G IVPB 30 MIN
3.3750 g | Freq: Once | INTRAVENOUS | Status: AC
  Administered 2016-02-14: 3.375 g via INTRAVENOUS
  Filled 2016-02-14: qty 50

## 2016-02-14 MED ORDER — VANCOMYCIN HCL IN DEXTROSE 750-5 MG/150ML-% IV SOLN
750.0000 mg | Freq: Two times a day (BID) | INTRAVENOUS | Status: DC
  Administered 2016-02-14 – 2016-02-15 (×3): 750 mg via INTRAVENOUS
  Filled 2016-02-14 (×4): qty 150

## 2016-02-14 MED ORDER — ACETAMINOPHEN 325 MG PO TABS
650.0000 mg | ORAL_TABLET | Freq: Four times a day (QID) | ORAL | Status: DC | PRN
  Administered 2016-02-14 – 2016-02-22 (×3): 650 mg via ORAL
  Filled 2016-02-14 (×2): qty 2

## 2016-02-14 MED ORDER — DEXTROSE 5 % IV SOLN: 2.0000 g | Freq: Once | INTRAVENOUS | Status: DC

## 2016-02-14 MED ORDER — PHENAZOPYRIDINE HCL 200 MG PO TABS
200.0000 mg | ORAL_TABLET | Freq: Three times a day (TID) | ORAL | Status: AC
  Administered 2016-02-14 – 2016-02-16 (×6): 200 mg via ORAL
  Filled 2016-02-14 (×6): qty 1

## 2016-02-14 MED ORDER — SODIUM CHLORIDE 0.9 % IV SOLN
INTRAVENOUS | Status: DC
  Administered 2016-02-15 – 2016-02-16 (×2): via INTRAVENOUS
  Administered 2016-02-16: 100 mL/h via INTRAVENOUS

## 2016-02-14 MED ORDER — SODIUM CHLORIDE 0.9 % IV BOLUS (SEPSIS)
1000.0000 mL | Freq: Once | INTRAVENOUS | Status: AC
Start: 2016-02-14 — End: 2016-02-14
  Administered 2016-02-14 (×2): 1000 mL via INTRAVENOUS

## 2016-02-14 MED ORDER — PANTOPRAZOLE SODIUM 40 MG PO TBEC
40.0000 mg | DELAYED_RELEASE_TABLET | Freq: Every day | ORAL | Status: DC
  Administered 2016-02-14 – 2016-02-22 (×9): 40 mg via ORAL
  Filled 2016-02-14 (×9): qty 1

## 2016-02-14 NOTE — ED Notes (Signed)
Report attempted, RN to call back. 

## 2016-02-14 NOTE — ED Notes (Signed)
Patient transported to X-ray 

## 2016-02-14 NOTE — ED Notes (Signed)
Port accessed, 20gauge

## 2016-02-14 NOTE — ED Notes (Signed)
Micro lab called to notified that blood cx sent by phlebotomist were rejected due to don't have label on it.

## 2016-02-14 NOTE — Care Management Note (Signed)
Case Management Note  Patient Details  Name: Brandy Hardy MRN: [id number] Date of Birth: 1955-12-02  Subjective/Objective:           Adm w sepsis         Action/Plan: indep pta, lives w husband   Expected Discharge Date:                  Expected Discharge Plan:  Home/Self Care  In-House Referral:     Discharge planning Services     Post Acute Care Choice:    Choice offered to:     DME Arranged:    DME Agency:     HH Arranged:    HH Agency:     Status of Service:  In process, will continue to follow  If discussed at Long Length of Stay Meetings, dates discussed:    Additional Comments: watching bp at present. On levophed if  Lacretia Leigh, RN 02/14/2016, 9:42 AM

## 2016-02-14 NOTE — H&P (Signed)
PULMONARY / CRITICAL CARE MEDICINE   Name: Laurielle Plock MRN: [id number] DOB: [birth year]    ADMISSION DATE:  02/14/2016  CHIEF COMPLAINT:  Weakness   HISTORY OF PRESENT ILLNESS:   Ramira Ly is 60 y.o. female who presented to ED for weakness and s/p fall without LOC or head trauma. Was experiencing dysuria, suprapubic pain, and urinary frequency for past 5 days. Was evaluated at Urgent Care facility and diagnosed with UTI. Was given Rx for Norco, Nitrofurantoin, and Azo. She has taken 3 days of antibiotics. A urine culture was sent but patient is unaware of the results. After starting these medications, dysuria improved but her other symptoms did not.   In the ED, patient was found to be febrile, tachycardic, and hypotensive. Labs were significant for K 2.4, leukocytosis of 20.9, and lactic acid of 4.7. UA appeared consistent with an infectious process.  Code sepsis was called. Patient was given NS 4L bolus without improvement in blood pressure. Therefore, Levophed was initiated. Broad spectrum antibiotics were initiated. Patient required in and out cath in the ED. RN reports seeing thick, white cottage cheese like discharge around the urethra and questionably from the vagina.   PAST MEDICAL HISTORY :  She  has a past medical history of Cancer (HCC).History of cervical cancer. Last round of radiation in 2016.   HTN > off meds.   PAST SURGICAL HISTORY: She  has a past surgical history that includes Abdominal hysterectomy. Pt has a R sided Port since 2015 > no history of infection.   No Known Allergies  No current facility-administered medications on file prior to encounter.    Current Outpatient Prescriptions on File Prior to Encounter  Medication Sig  . celecoxib (CELEBREX) 200 MG capsule Take 200 mg by mouth 2 (two) times daily.   . Probiotic Product (PROBIOTIC DAILY PO) Take 1 tablet by mouth daily.   . Vitamin D, Ergocalciferol, (DRISDOL) 50000 units CAPS capsule TAKE 1 CAPSULE  WEEKLY    FAMILY HISTORY:  Her indicated that the status of her mother is unknown. She indicated that the status of her father is unknown. She indicated that the status of her neg hx is unknown.  Mother is relatively healthy at a senior center. Father is deceased and had CAD.   SOCIAL HISTORY: She  reports that she has never smoked. She has never used smokeless tobacco. She reports that she does not drink alcohol or use drugs.   patient is married, has a daughter. They moved from [place] to [place] in 2007. 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 Nonsmoker. Does not drink alcohol. Functional.  REVIEW OF SYSTEMS:   Admits to fevers and chills. Reports 1 episode of vomiting 3 days ago. Mild non-bloody diarrhea since starting antibiotics. Denies chest pain and SOB. Denies vaginal discharge.   SUBJECTIVE:  Reports still feeling thirsty despite IVFs. Suprapubic pain is her biggest complaint.   VITAL SIGNS: BP (!) 93/52   Pulse 87   Temp 100 F (37.8 C) (Oral)   Resp (!) 30   Ht 5\' 3"  (1.6 m)   Wt 122.5 kg (270 lb)   SpO2 100%   BMI 47.83 kg/m   HEMODYNAMICS:    VENTILATOR SETTINGS:    INTAKE / OUTPUT: No intake/output data recorded.  PHYSICAL EXAMINATION: General:  Pleasant female lying in bed in NAD  Neuro:  Alert and oriented. Normal mentation. No focal deficits.  HEENT:  EOMI. Mucus membranes dry.  Cardiovascular:  RRR. No  murmurs appreciated. Right port-a-cath in place.  Lungs:  CTAB. Normal WOB. Abdomen:   +BS, soft, TTP in suprapubic region, non-distended, obese  Musculoskeletal:  Moves all extremities spontaneously.  Skin:  Erythema of pannus and abdominal skin folds with a few scattered satellite lesions.   LABS:  BMET  Recent Labs Lab 02/14/16 0228  NA 135  K 2.4*  CL 94*  CO2 26  BUN 20  CREATININE 1.14*  GLUCOSE 108*    Electrolytes  Recent Labs Lab 02/14/16 0228  CALCIUM 7.8*    CBC  Recent Labs Lab  02/14/16 0228  WBC 20.9*  HGB 11.5*  HCT 34.5*  PLT 287    Coag's No results for input(s): APTT, INR in the last 168 hours.  Sepsis Markers  Recent Labs Lab 02/14/16 0245 02/14/16 0517  LATICACIDVEN 4.72* 2.16*    ABG No results for input(s): PHART, PCO2ART, PO2ART in the last 168 hours.  Liver Enzymes  Recent Labs Lab 02/14/16 0228  AST 83*  ALT 46  ALKPHOS 118  BILITOT 0.9  ALBUMIN 1.6*    Cardiac Enzymes No results for input(s): TROPONINI, PROBNP in the last 168 hours.  Glucose No results for input(s): GLUCAP in the last 168 hours.  Imaging Dg Chest 1 View  Result Date: 02/14/2016 CLINICAL DATA:  Urinary tract infection. EXAM: CHEST 1 VIEW COMPARISON:  Chest radiograph 02/21/2009 FINDINGS: Right chest wall Port-A-Cath tip overlies the proximal right atrium. There is shallow lung inflation and cardiomegaly. No pneumothorax or sizable pleural effusion. No focal airspace consolidation or pulmonary edema. IMPRESSION: 1. Port-A-Cath tip overlying the proximal right atrium. 2. Cardiomegaly and shallow lung inflation without focal airspace disease. Electronically Signed   By: Deatra Robinson M.D.   On: 02/14/2016 03:40     STUDIES:  CXR 11/9: No focal airspace disease.   CULTURES: Urine Cx 11/9: pending  Blood Cx 11/9: pending   ANTIBIOTICS: Vancomycin (11/9 >> )  Zosyn (11/9 >> )   SIGNIFICANT EVENTS: 11/9: Admitted for septic shock   LINES/TUBES: Right Port-a-Cath 2015 >>  PIV x2   DISCUSSION: Alaniz Skidmore is 60 y.o. female with recent history of cervical cancer s/p radiation who presented with septic shock in the setting of recent diagnosis of UTI. Urine culture from outside urgent care not available, but suspect that patient was inadequately treated for her UTI.   ASSESSMENT / PLAN:  PULMONARY A: At risk for pulmonary edema secondary to IVF.   P:   Monitor with strict I/Os, daily weights, clinical exam, and CXR prn   CARDIOVASCULAR A:   Hypotensive secondary to sepsis   P:  Levophed with MAP goal >65  Trend lactic acid  Telemetry   RENAL A:   AKI likely secondary dehydration (bl Cr 0.6)  Hypokalemia s/p repletion in ED   P:   IVFs  Monitor Cr on BMET  Replete electrolytes prn  Strict I/Os  CT renal stone study pending   GASTROINTESTINAL A:   No acute events  P:   Heart healthy diet  Protonix PO   HEMATOLOGIC A:   DVT prophylaxis  P:  Heparin SQ   INFECTIOUS A:   UTI  Septic Shock  Candidal intertrigo  P:   Vancomycin and Zosyn (11/9 >>)  Monitor WBC, fever  Trend Lactic Acid  Give additional 1L NS bolus  Fluconazole (11/9 >> )  Wet prep and KOH   ENDOCRINE A:   No acute events    P:   Monitor  NEUROLOGIC A:   Normal mental status  P:   Continue to monitor in setting of sepsis    FAMILY  - Updates: Husband at bedside   - Inter-disciplinary family meet or Palliative Care meeting due by:  day 7 after admission, Feb 20 2016   Marcy Siren, D.O. PGY-2, [facility] 02/14/2016, 6:03 AM  ATTENDING NOTE / ATTESTATION NOTE :   I have discussed the case with the resident/APP  Dr. Marcy Siren and Joneen Roach.   I agree with the resident/APP's  history, physical examination, assessment, and plans.    I have edited the above note and modified it according to our agreed history, physical examination, assessment and plan.   Patient was diagnosed with cervical cancer 14 years ago. She had a hysterectomy, radiation, chemotherapy. She has ongoing treatment most likely for persistent disease. They moved from [place] to [place] in 2007. Her GYN oncology is Dr. Joelyn Oms over at [facility]. She had radiation in November 2016. During that time also, she received chemotherapy. Since radiation in November 2016, she's had frequent urination and also vulvar ulcer. No infection however.  The acute problem started 2 weeks ago. She started having more  frequent urination, almost every hour. Also with dysuria, fevers, chills. Generalized weakness persisted and worsened. She had poor by mouth intake. 5 days ago, she went to urgent care and urinalysis showed possible UTI and she was given nitrofurantoin. Dysuria improved but other symptoms persisted and worsened. Last night, she went to the bathroom and was so weak she fell. She was too weak to get up so husband called 911. She was hypotensive in the 70s systolic on arrival. Mildly tachycardic. She was given IV fluids. By the time I saw her, she was on her fifth liter of saline. She was also started on levophed at 8 mcg/min and her BP was 100/60. Per RN, she had cheesy urethral discharge. She was started on broad-spectrum antibiotics. She was pancultured.  Physical examination. Patient seen. Awake, comfortable, not in distress. Mild discomfort in the suprapubic area. Blood pressure 90/50, heart rate 88, respiratory rate 24, 100% O2 saturation on 3 L nasal cannula. Temperature 100F. Morbidly obese. Cranial nerves grossly intact. No lateralizing signs. Airway was a little crowded. No neck vein distention. Chest exam showed good air entry. Diminished breath sounds in the bases. No crackles or rhonchi or wheezing. Cardiac exam showed good S1 and S2. No S3 murmur rub or gallop. Abdomen was benign, obese, pannus prominent. No tenderness elicited. Some erythema over at the suprapubic area. Skin was warm and dry. No rash. Chronic grade 1 nonpitting edema. Good pulses. Good capillary refill.  Labs reviewed. Initial lactate was 4.7, improved to 2.16. WBC was 20. Creatinine 1.14. Bicarbonate 26. Chest x-ray with low lung volumes, mild cardiomegaly.  Assessment: 1. Shock, likely a combination of hypovolemic and septic shock. Hypovolemic from poor by mouth intake the last 2 weeks. Septic likely related to UTI, complicated. Concern for fungal infection given cottage cheeselike discharge per urethra. 2. AK I secondary to  above. 3. Lactic acidosis, related to septic shock and hypovolemic shock, clinically improved. 4. Mild transaminitis. 5. Morbid obesity.  Plan : 1. She is currently on her fifth liter of normal saline. Repeat sepsis assessment done. I think patient at this point is adequately resuscitated. Agree with continuing levophed, keep MAP > 65. Oertli on low-dose at 8 mcg/kg/m and MAPs are acceptable. 2. Agree with CT scan of the abdomen and  pelvis. 3. We need to retrieve records from her GYN Oncology, Dr. Joelyn Oms over at [facility]. 4. Panculture. Continue broad-spectrum antibiotics. Agree with antifungal at this point. 5. Continue maintenance IV fluids. Watch out for congestion. 6. Cont other meds.  7. DVT prophylaxis. 8. I anticipate, we can wean off levophed the next 12-24 hrs and if she is clinically improved, can be transferred out of ICU.  I have spent 35 minutes of critical care time with this patient today.  Family :Family updated at length today. I extensively discussed the plan of care with the patient and her husband.   Pollie Meyer, MD 02/14/2016, 6:03 AM [facility] Pager [phone] After 3 pm or if no answer, call [phone]

## 2016-02-14 NOTE — ED Provider Notes (Signed)
[facility] DEPT Provider Note   CSN: [id number] Arrival date & time: 02/14/16  0206     History   Chief Complaint Chief Complaint  Patient presents with  . Code Sepsis    HPI Brandy Hardy is a 60 y.o. female.  The history is provided by the patient and medical records. No language interpreter was used.   Brandy Hardy is a 60 y.o. female  with a PMH of cervical cancer currently in remission, HTN, HLD, DM, obesity who presents to the Emergency Department for evaluation after a fall just prior to arrival. Patient states she went to the bathroom, when she felt very weak and collapsed. Husband lowered her to the floor. No head injury. EMS called. Patient states she has been having worsening suprapubic abdominal pain which occasionally radiates to left lower abdomen x 4 days. Associated symptoms include dysuria which is improving, one episode of emesis three days ago, nonbloody loose stools. She was seen at urgent care 5 days ago, diagnosed with UTI and started on Macrobid and Pyridium. She has taken three days worth of antibiotics now. Dysuria improved, but abdominal pain worsening. Patient noted to be hypotensive upon EMS arrival and 500 NS bolus given.   Past Medical History:  Diagnosis Date  . [facility])    cervical    Patient Active Problem List   Diagnosis Date Noted  . Septic shock ([place]) 02/14/2016  . UTI (urinary tract infection)   . Obesity, morbid ([place]) 02/21/2013  . Unspecified essential hypertension 11/23/2012  . Type II or unspecified type diabetes mellitus without mention of complication, not stated as uncontrolled 11/23/2012  . Other and unspecified hyperlipidemia 11/23/2012    Past Surgical History:  Procedure Laterality Date  . ABDOMINAL HYSTERECTOMY      OB History    No data available       Home Medications    Prior to Admission medications   Medication Sig Start Date End Date Taking? Authorizing Provider  celecoxib (CELEBREX) 200 MG capsule  Take 200 mg by mouth 2 (two) times daily.  07/09/15  Yes Historical Provider, MD  HYDROcodone-acetaminophen (NORCO/VICODIN) 5-325 MG tablet Take 1 tablet by mouth every 4 (four) hours as needed for moderate pain.   Yes Historical Provider, MD  nitrofurantoin (MACRODANTIN) 100 MG capsule Take 100 mg by mouth 2 (two) times daily.   Yes Historical Provider, MD  Probiotic Product (PROBIOTIC DAILY PO) Take 1 tablet by mouth daily.    Yes Historical Provider, MD  Vitamin D, Ergocalciferol, (DRISDOL) 50000 units CAPS capsule TAKE 1 CAPSULE WEEKLY 11/21/15  Yes Elayne Snare, MD    Family History Family History  Problem Relation Age of Onset  . Hypertension Mother   . Heart disease Father 33    Expired  . Diabetes Neg Hx     Social History Social History  Substance Use Topics  . Smoking status: Never Smoker  . Smokeless tobacco: Never Used  . Alcohol use No     Allergies   Patient has no known allergies.   Review of Systems Review of Systems  Constitutional: Positive for fever.  HENT: Negative for congestion.   Eyes: Negative for visual disturbance.  Respiratory: Negative for shortness of breath.   Cardiovascular: Negative for chest pain.  Gastrointestinal: Positive for abdominal pain, diarrhea, nausea and vomiting. Negative for blood in stool.  Genitourinary: Positive for dysuria.  Musculoskeletal: Negative for back pain and neck pain.  Skin: Negative for color change.  Neurological: Negative for headaches.  Physical Exam Updated Vital Signs BP (!) 93/52   Pulse 87   Temp 100 F (37.8 C) (Oral)   Resp (!) 30   Ht 5\' 3"  (1.6 m)   Wt 122.5 kg   SpO2 100%   BMI 47.83 kg/m   Physical Exam  Constitutional: She is oriented to person, place, and time. She appears well-developed and well-nourished.  WDWN obese female.   HENT:  Head: Normocephalic and atraumatic.  Eyes: EOM are normal. Pupils are equal, round, and reactive to light.  Cardiovascular: Normal heart sounds.     No murmur heard. Tachycardic but regular. Intact and equal distal pulses 4.  Pulmonary/Chest: Effort normal and breath sounds normal. No respiratory distress.  Abdominal: Soft. Bowel sounds are normal. She exhibits no distension. There is tenderness. There is no rebound and no guarding.  Tenderness to palpation of suprapubic and left lower abdomen-no focal areas of tenderness.  Neurological: She is alert and oriented to person, place, and time. No cranial nerve deficit.  Skin: Skin is warm and dry.  Nursing note and vitals reviewed.   ED Treatments / Results  Labs (all labs ordered are listed, but only abnormal results are displayed) Labs Reviewed  COMPREHENSIVE METABOLIC PANEL - Abnormal; Notable for the following:       Result Value   Potassium 2.4 (*)    Chloride 94 (*)    Glucose, Bld 108 (*)    Creatinine, Ser 1.14 (*)    Calcium 7.8 (*)    Total Protein 5.7 (*)    Albumin 1.6 (*)    AST 83 (*)    GFR calc non Af Amer 51 (*)    GFR calc Af Amer 59 (*)    All other components within normal limits  CBC WITH DIFFERENTIAL/PLATELET - Abnormal; Notable for the following:    WBC 20.9 (*)    RBC 3.73 (*)    Hemoglobin 11.5 (*)    HCT 34.5 (*)    Neutro Abs 19.9 (*)    Lymphs Abs 0.4 (*)    All other components within normal limits  URINALYSIS, ROUTINE W REFLEX MICROSCOPIC (NOT AT [facility]) - Abnormal; Notable for the following:    Color, Urine RED (*)    APPearance TURBID (*)    Hgb urine dipstick MODERATE (*)    Bilirubin Urine MODERATE (*)    Ketones, ur 15 (*)    Protein, ur 100 (*)    Nitrite POSITIVE (*)    Leukocytes, UA MODERATE (*)    All other components within normal limits  URINE MICROSCOPIC-ADD ON - Abnormal; Notable for the following:    Squamous Epithelial / LPF 0-5 (*)    Bacteria, UA MANY (*)    Casts GRANULAR CAST (*)    All other components within normal limits  I-STAT CG4 LACTIC ACID, ED - Abnormal; Notable for the following:    Lactic Acid, Venous  4.72 (*)    All other components within normal limits  I-STAT CG4 LACTIC ACID, ED - Abnormal; Notable for the following:    Lactic Acid, Venous 2.16 (*)    All other components within normal limits  CULTURE, BLOOD (ROUTINE X 2)  URINE CULTURE  WET PREP, GENITAL  KOH PREP  MRSA PCR SCREENING  MRSA PCR SCREENING  MAGNESIUM  LACTIC ACID, PLASMA  LACTIC ACID, PLASMA  CORTISOL  TROPONIN I  TROPONIN I  TROPONIN I  CBC  BASIC METABOLIC PANEL  MAGNESIUM  PHOSPHORUS    EKG  EKG Interpretation  Date/Time:  Thursday February 14 2016 02:19:42 EST Ventricular Rate:  117 PR Interval:    QRS Duration: 89 QT Interval:  297 QTC Calculation: 415 R Axis:   102 Text Interpretation:  Sinus tachycardia Prolonged PR interval Right axis deviation Low voltage, precordial leads Minimal ST elevation, inferior leads Baseline wander in lead(s) II III aVR aVL aVF V4 V6 Artifact Confirmed by [place] [phone]) on 02/14/2016 2:27:23 AM       Radiology Dg Chest 1 View  Result Date: 02/14/2016 CLINICAL DATA:  Urinary tract infection. EXAM: CHEST 1 VIEW COMPARISON:  Chest radiograph 02/21/2009 FINDINGS: Right chest wall Port-A-Cath tip overlies the proximal right atrium. There is shallow lung inflation and cardiomegaly. No pneumothorax or sizable pleural effusion. No focal airspace consolidation or pulmonary edema. IMPRESSION: 1. Port-A-Cath tip overlying the proximal right atrium. 2. Cardiomegaly and shallow lung inflation without focal airspace disease. Electronically Signed   By: Ulyses Jarred M.D.   On: 02/14/2016 03:40    Procedures Procedures (including critical care time)  CRITICAL CARE Performed by: Ozella Almond Ward   Total critical care time: 35 minutes  Critical care time was exclusive of separately billable procedures and treating other patients.  Critical care was necessary to treat or prevent imminent or life-threatening deterioration.  Critical care was time spent  personally by me on the following activities: development of treatment plan with patient and/or surrogate as well as nursing, discussions with consultants, evaluation of patient's response to treatment, examination of patient, obtaining history from patient or surrogate, ordering and performing treatments and interventions, ordering and review of laboratory studies, ordering and review of radiographic studies, pulse oximetry and re-evaluation of patient's condition.   Medications Ordered in ED Medications  potassium chloride 30 mEq in sodium chloride 0.9 % 265 mL (KCL MULTIRUN) IVPB (30 mEq Intravenous Given 02/14/16 0402)  vancomycin (VANCOCIN) IVPB 750 mg/150 ml premix (not administered)  piperacillin-tazobactam (ZOSYN) IVPB 3.375 g (not administered)  norepinephrine (LEVOPHED) 4 mg in dextrose 5 % 250 mL (0.016 mg/mL) infusion (8 mcg/min Intravenous Rate/Dose Change 02/14/16 0530)  0.9 %  sodium chloride infusion (not administered)  heparin injection 5,000 Units (not administered)  pantoprazole (PROTONIX) EC tablet 40 mg (not administered)  sodium chloride 0.9 % bolus 1,000 mL (1,000 mLs Intravenous New Bag/Given 02/14/16 0523)  0.9 %  sodium chloride infusion (not administered)  fluconazole (DIFLUCAN) IVPB 200 mg (not administered)  sodium chloride 0.9 % bolus 1,000 mL (0 mLs Intravenous Stopped 02/14/16 0508)    And  sodium chloride 0.9 % bolus 1,000 mL (1,000 mLs Intravenous New Bag/Given 02/14/16 0247)    And  sodium chloride 0.9 % bolus 1,000 mL (0 mLs Intravenous Stopped 02/14/16 0508)  piperacillin-tazobactam (ZOSYN) IVPB 3.375 g (0 g Intravenous Stopped 02/14/16 0313)  vancomycin (VANCOCIN) IVPB 1000 mg/200 mL premix (0 mg Intravenous Stopped 02/14/16 0404)  potassium chloride SA (K-DUR,KLOR-CON) CR tablet 40 mEq (40 mEq Oral Given 02/14/16 0402)     Initial Impression / Assessment and Plan / ED Course  I have reviewed the triage vital signs and the nursing notes.  Pertinent labs &  imaging results that were available during my care of the patient were reviewed by me and considered in my medical decision making (see chart for details).  Clinical Course    Emelin Adolphe is a 59 y.o. female who presents to ED for generalized weakness leading to fall. Upon arrival, patient was febrile at 102.4, tachycardic 110s and hypotensive 72/40.  Recently treated for UTI and on macrobid.  Abdominal exam with no focal areas of tenderness, but she is tender to palpation suprapubically and left lower abdomen. Code sepsis initiated. Blood cultures obtained. In and out cath and urine sent for culture. Fluids and vanc/Zosyn started.   Lactic of 4.72, white count of 20.9 with shift.  CMP with potassium of 2.4, replenished in ED. AKI with creatinine of 1.14.  Sepsis - Repeat Assessment  Performed at:    04:00  Vitals     Blood pressure (!)79/44, pulse 87, temperature 100 F (37.8 C), temperature source Oral, resp. rate (!) 30, height 5\' 3"  (1.6 m), weight 122.5 kg, SpO2 100 %.  Heart:     Tachycardic  Lungs:    CTA  Capillary Refill:   <2 sec   Peripheral Pulse:   Radial pulse palpable  Skin:     Normal Color  Patient has received 2 L of fluid and blood pressures are not improving, will start Levophed and consult critical care.  4:28 AM - Discussed case with elink, critical care to see patient.   Lactic count 2.16, critical care at bedside and will admit.  Patient seen by and discussed with Dr. Wyvonnia Dusky who agrees with treatment plan.   Final Clinical Impressions(s) / ED Diagnoses   Final diagnoses:  Febrile  Sepsis, due to unspecified organism [facility])    New Prescriptions Current Discharge Medication List       [facility] Ward, PA-C 02/14/16 [id number]    Ezequiel Essex, MD 02/14/16 [phone]

## 2016-02-14 NOTE — ED Notes (Signed)
Admitting MD at the bedside.  

## 2016-02-14 NOTE — ED Triage Notes (Signed)
Pt brought to ED by [facility] from home after having a fall on her bathroom, pt became very weak on her legs and fell denies LOC states she just got very weak, pt is on treatment for UTI since Sunday, BP on EMS arrival 50/50 with HR 140, 500 mL NS bolus given by EMS BP up to 92 /60 HR 120. Pt c/o 7/10 groin pain hx of cervical ca.

## 2016-02-14 NOTE — ED Provider Notes (Signed)
Brandy Hardy is a 60 y.o. female   2:46 AM Pt presents s/p fall tonight. No LOC. Husband reports recent insomnia, generalized weakness, and decreased PO intake. He states tonight pt collapsed on the toilet due to weakness. She was lowered to the floor. At this time she reports continued suprapubic abdominal pain which she has been experiencing x 5 days. She was evaluated at UC 5 days ago for same as well as fever and dysuria. She was diagnosed with UTI and started on antibiotics which she has been taking for  3 days. She also notes ~ 2 episodes of diarrhea a day, no blood in her stool and 1 episode of vomiting ~ 3 days ago. Dysuria has improved at this time. Pt has a h/o cervical CA is currently in remission. Patient completed chemo/radiation in 2015; scheduled for full body scan on 03/02/16. Oncologist is at [facility]. No h/o kidney stones. No SOB or dizziness.   Pt is morbidly obese, diaphoretic, tachycardic to 110s. She has diffuse lower abdominal tenderness underneath panus and port a cath in right chest. No CVA tenderness. Will admit.  Sepsis with near syncopal episode and weakness. She is hypotensive, febrile, tachycardic. Code sepsis initiated. Will also need CT to rule out infected kidney stone. Broad spectrum antibiotics given after cultures obtained BP still low despite 30 ml/kg.  Start levophed.  CT pending.  Airway and mental status stable. Admission d/w critical care for septic shock with refractory hypotension  CRITICAL CARE Performed by: Ezequiel Essex Total critical care time: 35 minutes Critical care time was exclusive of separately billable procedures and treating other patients. Critical care was necessary to treat or prevent imminent or life-threatening deterioration. Critical care was time spent personally by me on the following activities: development of treatment plan with patient and/or surrogate as well as nursing, discussions with consultants, evaluation of patient's  response to treatment, examination of patient, obtaining history from patient or surrogate, ordering and performing treatments and interventions, ordering and review of laboratory studies, ordering and review of radiographic studies, pulse oximetry and re-evaluation of patient's condition.   By signing my name below, I, Evelene Croon, attest that this documentation has been prepared under the direction and in the presence of Ezequiel Essex, MD . Electronically Signed: Evelene Croon, Scribe. 02/14/2016. 3:01 AM.  I personally performed the services described in this documentation, which was scribed in my presence. The recorded information has been reviewed and is accurate.    Ezequiel Essex, MD 02/14/16 [phone]

## 2016-02-14 NOTE — Progress Notes (Signed)
PULMONARY / CRITICAL CARE MEDICINE   Name: Brandy Hardy MRN: [id number] DOB: 18-May-1955    ADMISSION DATE:  02/14/2016  CHIEF COMPLAINT:  Weakness   HISTORY OF PRESENT ILLNESS:   Brandy Hardy is 60 y.o. female who presented to ED for weakness and s/p fall without LOC or head trauma. Was experiencing dysuria, suprapubic pain, and urinary frequency for past 5 days. Was evaluated at Urgent Care facility and diagnosed with UTI. Was given Rx for Norco, Nitrofurantoin, and Azo. She has taken 3 days of antibiotics. A urine culture was sent but patient is unaware of the results. After starting these medications, dysuria improved but her other symptoms did not.   In the ED, patient was found to be febrile, tachycardic, and hypotensive. Labs were significant for K 2.4, leukocytosis of 20.9, and lactic acid of 4.7. UA appeared consistent with an infectious process.  Code sepsis was called. Patient was given NS 4L bolus without improvement in blood pressure. Therefore, Levophed was initiated. Broad spectrum antibiotics were initiated. Patient required in and out cath in the ED. RN reports seeing thick, white cottage cheese like discharge around the urethra and questionably from the vagina.   SUBJECTIVE:  Burning on urination and asking for pain medications, going down to CT  VITAL SIGNS: BP 102/61 (BP Location: Right Arm)   Pulse 82   Temp 97.8 F (36.6 C) (Oral)   Resp (!) 23   Ht 5\' 3"  (1.6 m)   Wt 123 kg (271 lb 2.7 oz)   SpO2 97%   BMI 48.03 kg/m   HEMODYNAMICS:    VENTILATOR SETTINGS:    INTAKE / OUTPUT: I/O last 3 completed shifts: In: 3137.5 [I.V.:87.5; IV Piggyback:3050] Out: 20 [Urine:20]  PHYSICAL EXAMINATION: General:  Pleasant female lying in bed in NAD  Neuro:  Alert and oriented. Normal mentation. No focal deficits.  HEENT:  EOMI. Mucus membranes dry.  Cardiovascular:  RRR. No murmurs appreciated. Right port-a-cath in place.  Lungs:  CTAB. Normal WOB. Abdomen:    +BS, soft, TTP in suprapubic region, non-distended, obese  Musculoskeletal:  Moves all extremities spontaneously.  Skin:  Erythema of pannus and abdominal skin folds with a few scattered satellite lesions.   LABS:  BMET  Recent Labs Lab 02/14/16 0228  NA 135  K 2.4*  CL 94*  CO2 26  BUN 20  CREATININE 1.14*  GLUCOSE 108*    Electrolytes  Recent Labs Lab 02/14/16 0228 02/14/16 0339  CALCIUM 7.8*  --   MG  --  1.9    CBC  Recent Labs Lab 02/14/16 0228  WBC 20.9*  HGB 11.5*  HCT 34.5*  PLT 287    Coag's No results for input(s): APTT, INR in the last 168 hours.  Sepsis Markers  Recent Labs Lab 02/14/16 0245 02/14/16 0517 02/14/16 0636  LATICACIDVEN 4.72* 2.16* 1.6    ABG No results for input(s): PHART, PCO2ART, PO2ART in the last 168 hours.  Liver Enzymes  Recent Labs Lab 02/14/16 0228  AST 83*  ALT 46  ALKPHOS 118  BILITOT 0.9  ALBUMIN 1.6*    Cardiac Enzymes  Recent Labs Lab 02/14/16 0636  TROPONINI 0.05*    Glucose No results for input(s): GLUCAP in the last 168 hours.  Imaging Dg Chest 1 View  Result Date: 02/14/2016 CLINICAL DATA:  Urinary tract infection. EXAM: CHEST 1 VIEW COMPARISON:  Chest radiograph 02/21/2009 FINDINGS: Right chest wall Port-A-Cath tip overlies the proximal right atrium. There is shallow lung inflation and cardiomegaly. No  pneumothorax or sizable pleural effusion. No focal airspace consolidation or pulmonary edema. IMPRESSION: 1. Port-A-Cath tip overlying the proximal right atrium. 2. Cardiomegaly and shallow lung inflation without focal airspace disease. Electronically Signed   By: Deatra Robinson M.D.   On: 02/14/2016 03:40     STUDIES:  CXR 11/9: No focal airspace disease.   CULTURES: Urine Cx 11/9: pending  Blood Cx 11/9: pending   ANTIBIOTICS: Vancomycin (11/9 >> )  Zosyn (11/9 >> )   SIGNIFICANT EVENTS: 11/9: Admitted for septic shock   LINES/TUBES: Right Port-a-Cath 2015 >>  PIV x2    DISCUSSION: Brandy Hardy is 60 y.o. female with recent history of cervical cancer s/p radiation who presented with septic shock in the setting of recent diagnosis of UTI. Urine culture from outside urgent care not available, but suspect that patient was inadequately treated for her UTI.   ASSESSMENT / PLAN:  PULMONARY A: At risk for pulmonary edema secondary to IVF.   P:   Monitor with strict I/Os, daily weights, clinical exam, and CXR prn   CARDIOVASCULAR A:  Hypotensive secondary to sepsis   P:  Titrate levophed to off. Telemetry monitoring  RENAL A:   AKI likely secondary dehydration (bl Cr 0.6)  Hypokalemia s/p repletion in ED   P:   IVFs  Monitor Cr on BMET  Replete electrolytes as needed Continue IVF resuscitation at NS 75 ml/hr. Strict I/Os. Going to CT scan right now.  GASTROINTESTINAL A:   No acute events  P:   Heart healthy diet  Protonix PO   HEMATOLOGIC A:   DVT prophylaxis  P:  Heparin SQ   INFECTIOUS A:   UTI  Septic Shock  Candidal intertrigo  P:   Vancomycin and Zosyn 11/9 >> Monitor WBC, fever  Trend Lactic Acid  Give additional 1L NS bolus  Fluconazole 11/9 >>  Wet prep and KOH unattainable, will continue fluconazole treatment.  ENDOCRINE A:   No acute events    P:   Monitor   NEUROLOGIC A:   Normal mental status  P:   Continue to monitor in setting of sepsis   FAMILY  - Updates: Patient updated bedside, transfer to SDU and to [facility] service with PCCM off 11/10.  The patient is critically ill with multiple organ systems failure and requires high complexity decision making for assessment and support, frequent evaluation and titration of therapies, application of advanced monitoring technologies and extensive interpretation of multiple databases.   Critical Care Time devoted to patient care services described in this note is  55  Minutes. This time reflects time of care of this signee Dr Koren Bound. This critical care  time does not reflect procedure time, or teaching time or supervisory time of PA/NP/Med student/Med Resident etc but could involve care discussion time.  Alyson Reedy, M.D. [facility] Pulmonary/Critical Care Medicine. Pager: [phone]. After hours pager: [phone].

## 2016-02-14 NOTE — Progress Notes (Addendum)
Pharmacy Antibiotic Note  Brandy Hardy is a 60 y.o. female admitted on 02/14/2016 with sepsis.  Pharmacy has been consulted for Vancomycin/Zosyn dosing. Presents to ED after a fall, found to be hypotensive, WBC elevated, Scr 1.14, lactic acid elevated, other labs reviewed.   Plan: -Vancomycin 750 mg IV q12h -Zosyn 3.375G IV q8h to be infused over 4 hours -Trend WBC, temp, renal function -Drug levels as indicated   Height: 5\' 3"  (160 cm) Weight: 270 lb (122.5 kg) IBW/kg (Calculated) : 52.4  Temp (24hrs), Avg:102.4 F (39.1 C), Min:102.4 F (39.1 C), Max:102.4 F (39.1 C)   Recent Labs Lab 02/14/16 0228 02/14/16 0245  WBC 20.9*  --   CREATININE 1.14*  --   LATICACIDVEN  --  4.72*    Estimated Creatinine Clearance: 66.6 mL/min (by C-G formula based on SCr of 1.14 mg/dL (H)).    No Known Allergies  Brandy Hardy 02/14/2016 3:40 AM     ADDENDUM: Now w/ concern for urogenital candidiasis.  Will add fluconazole 200mg  IV Q24H. Brandy Hardy, PharmD, BCPS 02/14/2016 5:35 AM

## 2016-02-15 DIAGNOSIS — R748 Abnormal levels of other serum enzymes: Secondary | ICD-10-CM

## 2016-02-15 LAB — CBC
HCT: 30.6 % — ABNORMAL LOW (ref 36.0–46.0)
HEMOGLOBIN: 10.3 g/dL — AB (ref 12.0–15.0)
MCH: 30.6 pg (ref 26.0–34.0)
MCHC: 33.7 g/dL (ref 30.0–36.0)
MCV: 90.8 fL (ref 78.0–100.0)
Platelets: 270 10*3/uL (ref 150–400)
RBC: 3.37 MIL/uL — ABNORMAL LOW (ref 3.87–5.11)
RDW: 14.7 % (ref 11.5–15.5)
WBC: 16.1 10*3/uL — ABNORMAL HIGH (ref 4.0–10.5)

## 2016-02-15 LAB — BASIC METABOLIC PANEL
Anion gap: 9 (ref 5–15)
BUN: 22 mg/dL — ABNORMAL HIGH (ref 6–20)
CALCIUM: 6.8 mg/dL — AB (ref 8.9–10.3)
CO2: 25 mmol/L (ref 22–32)
CREATININE: 1.32 mg/dL — AB (ref 0.44–1.00)
Chloride: 100 mmol/L — ABNORMAL LOW (ref 101–111)
GFR, EST AFRICAN AMERICAN: 50 mL/min — AB (ref >60)
GFR, EST NON AFRICAN AMERICAN: 43 mL/min — AB (ref >60)
Glucose, Bld: 120 mg/dL — ABNORMAL HIGH (ref 65–99)
Potassium: 2.9 mmol/L — ABNORMAL LOW (ref 3.5–5.1)
SODIUM: 134 mmol/L — AB (ref 135–145)

## 2016-02-15 LAB — URINE CULTURE: Culture: <10000 — AB

## 2016-02-15 LAB — TROPONIN I: Troponin I: 0.7 ng/mL (ref <0.03)

## 2016-02-15 LAB — PHOSPHORUS: PHOSPHORUS: 2.1 mg/dL — AB (ref 2.5–4.6)

## 2016-02-15 LAB — MAGNESIUM: MAGNESIUM: 1.9 mg/dL (ref 1.7–2.4)

## 2016-02-15 MED ORDER — SODIUM CHLORIDE 0.9% FLUSH
10.0000 mL | INTRAVENOUS | Status: DC | PRN
Start: 2016-02-15 — End: 2016-02-22

## 2016-02-15 MED ORDER — SODIUM CHLORIDE 0.9% FLUSH: 10.0000 mL | Freq: Two times a day (BID) | INTRAVENOUS | Status: DC

## 2016-02-15 MED ORDER — POTASSIUM CHLORIDE CRYS ER 20 MEQ PO TBCR
40.0000 meq | EXTENDED_RELEASE_TABLET | Freq: Two times a day (BID) | ORAL | Status: AC
  Administered 2016-02-15 (×2): 40 meq via ORAL
  Filled 2016-02-15 (×2): qty 2

## 2016-02-15 MED ORDER — FENTANYL CITRATE (PF) 100 MCG/2ML IJ SOLN
25.0000 ug | INTRAMUSCULAR | Status: DC | PRN
  Administered 2016-02-20 (×2): 100 ug via INTRAVENOUS

## 2016-02-15 NOTE — Progress Notes (Signed)
Patient refused foley catheter placement

## 2016-02-15 NOTE — Progress Notes (Signed)
Unable to replace K+ per protocol d/t urine occurances charted.

## 2016-02-15 NOTE — Progress Notes (Signed)
[place] TEAM 1 - Stepdown/ICU TEAM  Brandy Hardy  ZDG:[id number] DOB: 11/23/1955 DOA: 02/14/2016 PCP: Lupe Carney, MD    Brief Narrative:  60 y.o. female who presented to ED for weakness s/p fall without LOC or head trauma. Was experiencing dysuria, suprapubic pain, and urinary frequency for 5 days. Was evaluated at Urgent Care facility and diagnosed with UTI. Was given Rx for Norco, Nitrofurantoin, and Azo. She had taken 3 days of antibiotics. A urine culture was sent but patient is unaware of the results. After starting these medications, dysuria improved but her other symptoms did not.   In the ED, patient was found to be febrile, tachycardic, and hypotensive. Labs were significant for K 2.4, leukocytosis of 20.9, and lactic acid of 4.7. UA appeared consistent with an infectious process.  Code sepsis was called. Patient was given NS 4L bolus without improvement in blood pressure. Therefore, Levophed was initiated. Broad spectrum antibiotics were initiated. Patient required in and out cath in the ED. RN reports seeing thick, white cottage cheese like discharge around the urethra and questionably from the vagina.   Significant Events: 11/9 Admit for septic shock 11/10 [facility] assumed care    Subjective: The patient is resting comfortably in bed.  She denies chest pain shortness of breath nausea or vomiting.  She does admit to right lower quadrant and pelvic area pain which is very superficial and duplicated when her pannus is palpated.  Assessment & Plan:  Septic Shock due to UTI levophed now off - urine culture not helpful - continue broad-spectrum empiric coverage  Pelvic wall cellulitis w/ ?phlegmon Clinically improving - follow with ongoing broad-spectrum antibiotics - if worsens clinically or shows evidence of persitent infection we'll need to repeat imaging to rule out abscess and consider surgical consultation  AKI Due to above (bl Cr 0.6) - increase resuscitation efforts and  follow trend  Mildly elevated troponin  [facility] - no sx at present to suggest USAP/denies CP  Hypokalemia  Replace and follow - Mg is normal   Candidal intertrigo   DVT prophylaxis: SQ heparin  Code Status: FULL CODE Family Communication: no family present at time of exam  Disposition Plan:   Consultants:  PCCM  Antimicrobials:  Vancomycin 11/8 > 11/10 Zosyn 11/8 >  Objective: Blood pressure 116/69, pulse 72, temperature 98.3 F (36.8 C), temperature source Oral, resp. rate (!) 24, height 5\' 3"  (1.6 m), weight 127.1 kg (280 lb 3.3 oz), SpO2 95 %.  Intake/Output Summary (Last 24 hours) at 02/15/16 1143 Last data filed at 02/15/16 0544  Gross per 24 hour  Intake             1350 ml  Output                0 ml  Net             1350 ml   Filed Weights   02/14/16 0219 02/14/16 0600 02/15/16 0313  Weight: 122.5 kg (270 lb) 123 kg (271 lb 2.7 oz) 127.1 kg (280 lb 3.3 oz)    Examination: General: No acute respiratory distress Lungs: Clear to auscultation bilaterally without wheezes or crackles Cardiovascular: Regular rate and rhythm without murmur gallop or rub normal S1 and S2 Abdomen: Nontender, nondistended, soft, bowel sounds positive, no rebound, no ascites, no appreciable mass - mild erythema of panus in suprapubic region w/o fluctuance or induration or d/c  Extremities: No significant cyanosis, clubbing, or edema bilateral lower extremities  CBC:  Recent Labs Lab 02/14/16 0228 02/14/16 1115 02/15/16 0440  WBC 20.9* 22.9* 16.1*  NEUTROABS 19.9*  --   --   HGB 11.5* 10.4* 10.3*  HCT 34.5* 31.4* 30.6*  MCV 92.5 92.1 90.8  PLT 287 302 270   Basic Metabolic Panel:  Recent Labs Lab 02/14/16 0228 02/14/16 0339 02/14/16 1115 02/15/16 0440  NA 135  --  133* 134*  K 2.4*  --  2.9* 2.9*  CL 94*  --  98* 100*  CO2 26  --  26 25  GLUCOSE 108*  --  166* 120*  BUN 20  --  15 22*  CREATININE 1.14*  --  0.84 1.32*  CALCIUM 7.8*  --  6.9* 6.8*  MG  --   1.9 1.7 1.9  PHOS  --   --  1.6* 2.1*   GFR: Estimated Creatinine Clearance: 58.9 mL/min (by C-G formula based on SCr of 1.32 mg/dL (H)).  Liver Function Tests:  Recent Labs Lab 02/14/16 0228  AST 83*  ALT 46  ALKPHOS 118  BILITOT 0.9  PROT 5.7*  ALBUMIN 1.6*   Cardiac Enzymes:  Recent Labs Lab 02/14/16 0636 02/14/16 1115 02/14/16 1715  TROPONINI 0.05* 0.03* 0.11*    HbA1C: Hgb A1c MFr Bld  Date/Time Value Ref Range Status  10/08/2015 10:36 AM 5.6 4.6 - 6.5 % Final    Comment:    Glycemic Control Guidelines for People with Diabetes:Non Diabetic:  <6%Goal of Therapy: <7%Additional Action Suggested:  >8%   02/27/2015 08:19 AM 5.7 4.6 - 6.5 % Final    Comment:    Glycemic Control Guidelines for People with Diabetes:Non Diabetic:  <6%Goal of Therapy: <7%Additional Action Suggested:  >8%     Recent Results (from the past 240 hour(s))  Urine culture     Status: Abnormal   Collection Time: 02/14/16  2:28 AM  Result Value Ref Range Status   Specimen Description URINE, RANDOM  Final   Special Requests NONE  Final   Culture <10,000 COLONIES/mL INSIGNIFICANT GROWTH (A)  Final   Report Status 02/15/2016 FINAL  Final  MRSA PCR Screening     Status: None   Collection Time: 02/14/16  6:04 AM  Result Value Ref Range Status   MRSA by PCR NEGATIVE NEGATIVE Final    Comment:        The GeneXpert MRSA Assay (FDA approved for NASAL specimens only), is one component of a comprehensive MRSA colonization surveillance program. It is not intended to diagnose MRSA infection nor to guide or monitor treatment for MRSA infections.      Scheduled Meds: . fluconazole (DIFLUCAN) IV  200 mg Intravenous Q24H  . heparin  5,000 Units Subcutaneous Q8H  . pantoprazole  40 mg Oral Daily  . phenazopyridine  200 mg Oral TID WC  . piperacillin-tazobactam (ZOSYN)  IV  3.375 g Intravenous Q8H  . potassium chloride  40 mEq Oral BID   Continuous Infusions: . sodium chloride 75 mL/hr at  02/14/16 0700     LOS: 1 day   Lonia Blood, MD [facility] Office  [phone] Pager - Text Page per Loretha Stapler as per below:  On-Call/Text Page:      [email or web address]      password [email or web address]  If 7PM-7AM, please contact night-coverage [email or web address] Password [email or web address] 02/15/2016, 11:43 AM

## 2016-02-15 NOTE — Progress Notes (Signed)
Discussed foley catheter placement with patient as per physician order. Patient indecisive as to whether she wants a catheter placed. Will continue to encourage a decision.

## 2016-02-15 NOTE — Progress Notes (Signed)
Troponin 0.7 reported to Dr Dorothy Puffer

## 2016-02-15 NOTE — Progress Notes (Signed)
Pt. Potassium of 2.9 and Ca of 6.8 reported to elink this am.  Will continue to monitor.

## 2016-02-16 ENCOUNTER — Inpatient Hospital Stay (HOSPITAL_COMMUNITY): Payer: Managed Care, Other (non HMO) | Admitting: Anesthesiology

## 2016-02-16 ENCOUNTER — Encounter (HOSPITAL_COMMUNITY)
Admission: EM | Disposition: A | Discharge status: Rehabilitation Facility | Payer: Self-pay | Source: Home / Self Care | Attending: Internal Medicine

## 2016-02-16 DIAGNOSIS — N10 Acute tubulo-interstitial nephritis: Secondary | ICD-10-CM

## 2016-02-16 HISTORY — PX: IRRIGATION AND DEBRIDEMENT ABSCESS: SHX5252

## 2016-02-16 LAB — BASIC METABOLIC PANEL
Anion gap: 8 (ref 5–15)
BUN: 16 mg/dL (ref 6–20)
CHLORIDE: 104 mmol/L (ref 101–111)
CO2: 27 mmol/L (ref 22–32)
CREATININE: 0.8 mg/dL (ref 0.44–1.00)
Calcium: 7.1 mg/dL — ABNORMAL LOW (ref 8.9–10.3)
Glucose, Bld: 108 mg/dL — ABNORMAL HIGH (ref 65–99)
POTASSIUM: 3.2 mmol/L — AB (ref 3.5–5.1)
SODIUM: 139 mmol/L (ref 135–145)

## 2016-02-16 LAB — CBC
HCT: 27.6 % — ABNORMAL LOW (ref 36.0–46.0)
Hemoglobin: 9 g/dL — ABNORMAL LOW (ref 12.0–15.0)
MCH: 29.4 pg (ref 26.0–34.0)
MCHC: 32.6 g/dL (ref 30.0–36.0)
MCV: 90.2 fL (ref 78.0–100.0)
PLATELETS: 266 10*3/uL (ref 150–400)
RBC: 3.06 MIL/uL — AB (ref 3.87–5.11)
RDW: 14.8 % (ref 11.5–15.5)
WBC: 15.4 10*3/uL — AB (ref 4.0–10.5)

## 2016-02-16 LAB — TROPONIN I: Troponin I: 0.05 ng/mL (ref <0.03)

## 2016-02-16 LAB — SURGICAL PCR SCREEN
MRSA, PCR: NEGATIVE
Staphylococcus aureus: NEGATIVE

## 2016-02-16 SURGERY — IRRIGATION AND DEBRIDEMENT ABSCESS
Anesthesia: General | Site: Groin | Laterality: Right

## 2016-02-16 MED ORDER — SODIUM CHLORIDE 0.9 % IV BOLUS (SEPSIS): 500.0000 mL | Freq: Once | INTRAVENOUS | Status: DC

## 2016-02-16 MED ORDER — METHOCARBAMOL 500 MG PO TABS: 500.0000 mg | ORAL_TABLET | Freq: Three times a day (TID) | ORAL | Status: DC | PRN

## 2016-02-16 MED ORDER — 0.9 % SODIUM CHLORIDE (POUR BTL) OPTIME
TOPICAL | Status: DC | PRN
  Administered 2016-02-16: 1000 mL

## 2016-02-16 MED ORDER — OXYCODONE HCL 5 MG PO TABS
5.0000 mg | ORAL_TABLET | Freq: Four times a day (QID) | ORAL | Status: DC | PRN
  Administered 2016-02-17 – 2016-02-19 (×6): 5 mg via ORAL
  Filled 2016-02-16 (×6): qty 1

## 2016-02-16 MED ORDER — LIDOCAINE 2% (20 MG/ML) 5 ML SYRINGE
INTRAMUSCULAR | Status: DC | PRN
  Administered 2016-02-16: 100 mg via INTRAVENOUS

## 2016-02-16 MED ORDER — IOPAMIDOL (ISOVUE-300) INJECTION 61%
INTRAVENOUS | Status: AC
  Filled 2016-02-16: qty 30

## 2016-02-16 MED ORDER — SUCCINYLCHOLINE CHLORIDE 200 MG/10ML IV SOSY
PREFILLED_SYRINGE | INTRAVENOUS | Status: DC | PRN
  Administered 2016-02-16: 120 mg via INTRAVENOUS

## 2016-02-16 MED ORDER — LIDOCAINE 2% (20 MG/ML) 5 ML SYRINGE
INTRAMUSCULAR | Status: AC
  Filled 2016-02-16: qty 5

## 2016-02-16 MED ORDER — SUCCINYLCHOLINE CHLORIDE 200 MG/10ML IV SOSY
PREFILLED_SYRINGE | INTRAVENOUS | Status: AC
  Filled 2016-02-16: qty 10

## 2016-02-16 MED ORDER — FENTANYL CITRATE (PF) 100 MCG/2ML IJ SOLN
INTRAMUSCULAR | Status: AC
  Filled 2016-02-16: qty 2

## 2016-02-16 MED ORDER — PROPOFOL 10 MG/ML IV BOLUS
INTRAVENOUS | Status: AC
  Filled 2016-02-16: qty 20

## 2016-02-16 MED ORDER — MORPHINE SULFATE (PF) 2 MG/ML IV SOLN
2.0000 mg | INTRAVENOUS | Status: DC | PRN
  Administered 2016-02-21 – 2016-02-22 (×4): 2 mg via INTRAVENOUS
  Filled 2016-02-16 (×3): qty 1
  Filled 2016-02-16: qty 2
  Filled 2016-02-16 (×3): qty 1

## 2016-02-16 MED ORDER — PROPOFOL 10 MG/ML IV BOLUS
INTRAVENOUS | Status: DC | PRN
  Administered 2016-02-16: 200 mg via INTRAVENOUS

## 2016-02-16 MED ORDER — ONDANSETRON HCL 4 MG/2ML IJ SOLN
INTRAMUSCULAR | Status: DC | PRN
  Administered 2016-02-16: 4 mg via INTRAVENOUS

## 2016-02-16 MED ORDER — VANCOMYCIN HCL 10 G IV SOLR
1250.0000 mg | Freq: Two times a day (BID) | INTRAVENOUS | Status: DC
  Administered 2016-02-17 – 2016-02-19 (×5): 1250 mg via INTRAVENOUS
  Filled 2016-02-16 (×8): qty 1250

## 2016-02-16 MED ORDER — ONDANSETRON HCL 4 MG/2ML IJ SOLN: 4.0000 mg | Freq: Once | INTRAMUSCULAR | Status: DC | PRN

## 2016-02-16 MED ORDER — LACTATED RINGERS IV SOLN
INTRAVENOUS | Status: DC | PRN
  Administered 2016-02-16: 18:00:00 via INTRAVENOUS

## 2016-02-16 MED ORDER — ONDANSETRON HCL 4 MG/2ML IJ SOLN
INTRAMUSCULAR | Status: AC
  Filled 2016-02-16: qty 2

## 2016-02-16 MED ORDER — FENTANYL CITRATE (PF) 100 MCG/2ML IJ SOLN
25.0000 ug | INTRAMUSCULAR | Status: DC | PRN
  Administered 2016-02-16: 50 ug via INTRAVENOUS

## 2016-02-16 MED ORDER — POTASSIUM CHLORIDE IN NACL 40-0.9 MEQ/L-% IV SOLN
INTRAVENOUS | Status: DC
  Administered 2016-02-16 – 2016-02-17 (×2): 75 mL/h via INTRAVENOUS
  Administered 2016-02-18 – 2016-02-20 (×3): 125 mL/h via INTRAVENOUS
  Filled 2016-02-16 (×10): qty 1000

## 2016-02-16 MED ORDER — SODIUM CHLORIDE 0.9 % IV SOLN
INTRAVENOUS | Status: DC
  Administered 2016-02-16: 19:00:00 via INTRAVENOUS

## 2016-02-16 MED ORDER — FENTANYL CITRATE (PF) 100 MCG/2ML IJ SOLN
INTRAMUSCULAR | Status: DC | PRN
  Administered 2016-02-16 (×2): 50 ug via INTRAVENOUS
  Administered 2016-02-16: 100 ug via INTRAVENOUS

## 2016-02-16 SURGICAL SUPPLY — 29 items
BNDG GAUZE ELAST 4 BULKY (GAUZE/BANDAGES/DRESSINGS) ×3 IMPLANT
CANISTER SUCTION 2500CC (MISCELLANEOUS) ×3 IMPLANT
COVER SURGICAL LIGHT HANDLE (MISCELLANEOUS) ×3 IMPLANT
DRAIN PENROSE 1X12 LTX STRL (DRAIN) ×3 IMPLANT
ELECT CAUTERY BLADE 6.4 (BLADE) ×3 IMPLANT
ELECT REM PT RETURN 9FT ADLT (ELECTROSURGICAL) ×3
ELECTRODE REM PT RTRN 9FT ADLT (ELECTROSURGICAL) ×1 IMPLANT
GAUZE SPONGE 4X4 12PLY STRL (GAUZE/BANDAGES/DRESSINGS) ×3 IMPLANT
GLOVE BIOGEL PI IND STRL 7.5 (GLOVE) ×1 IMPLANT
GLOVE BIOGEL PI INDICATOR 7.5 (GLOVE) ×2
GLOVE SURG SS PI 7.0 STRL IVOR (GLOVE) ×3 IMPLANT
GOWN STRL REUS W/ TWL LRG LVL3 (GOWN DISPOSABLE) ×2 IMPLANT
GOWN STRL REUS W/TWL LRG LVL3 (GOWN DISPOSABLE) ×4
KIT BASIN OR (CUSTOM PROCEDURE TRAY) ×3 IMPLANT
KIT ROOM TURNOVER OR (KITS) ×3 IMPLANT
NS IRRIG 1000ML POUR BTL (IV SOLUTION) ×3 IMPLANT
PACK SURGICAL SETUP 50X90 (CUSTOM PROCEDURE TRAY) ×3 IMPLANT
PAD ABD 8X10 STRL (GAUZE/BANDAGES/DRESSINGS) ×3 IMPLANT
PAD ARMBOARD 7.5X6 YLW CONV (MISCELLANEOUS) ×3 IMPLANT
PENCIL BUTTON HOLSTER BLD 10FT (ELECTRODE) ×3 IMPLANT
SUT ETHILON 2 0 FS 18 (SUTURE) ×3 IMPLANT
SWAB COLLECTION DEVICE MRSA (MISCELLANEOUS) ×3 IMPLANT
TAPE CLOTH SURG 4X10 WHT LF (GAUZE/BANDAGES/DRESSINGS) ×3 IMPLANT
TOWEL OR 17X24 6PK STRL BLUE (TOWEL DISPOSABLE) ×3 IMPLANT
TOWEL OR 17X26 10 PK STRL BLUE (TOWEL DISPOSABLE) ×3 IMPLANT
TUBE ANAEROBIC SPECIMEN COL (MISCELLANEOUS) ×3 IMPLANT
TUBE CONNECTING 12'X1/4 (SUCTIONS) ×1
TUBE CONNECTING 12X1/4 (SUCTIONS) ×2 IMPLANT
YANKAUER SUCT BULB TIP NO VENT (SUCTIONS) ×3 IMPLANT

## 2016-02-16 NOTE — OR Nursing (Signed)
Foley attempted with assistance from Dr. Kieth Brightly.  35fr and 58fr attempted without success.

## 2016-02-16 NOTE — Consult Note (Signed)
Reason for Consult:abscess Referring Physician: Akeela, Busk is an 60 y.o. female.  HPI: 60 yo female with 1 week history of intermittent fevers presented in septic shock thought to have UTI. Now improved however is found to have right groin abscess. She notes drainage from the area today.  She has recently had a 2 year history of chemo and radiation for cervical cancer and notes a cystitis with polyuria.  Past Medical History:  Diagnosis Date  . Cancer [facility])    cervical    Past Surgical History:  Procedure Laterality Date  . ABDOMINAL HYSTERECTOMY      Family History  Problem Relation Age of Onset  . Hypertension Mother   . Heart disease Father 37    Expired  . Diabetes Neg Hx     Social History:  reports that she has never smoked. She has never used smokeless tobacco. She reports that she does not drink alcohol or use drugs.  Allergies: No Known Allergies  Medications: I have reviewed the patient's current medications.  Results for orders placed or performed during the hospital encounter of 02/14/16 (from the past 48 hour(s))  Troponin I     Status: Abnormal   Collection Time: 02/14/16  5:15 PM  Result Value Ref Range   Troponin I 0.11 (HH) <0.03 ng/mL    Comment: CRITICAL VALUE NOTED.  VALUE IS CONSISTENT WITH PREVIOUSLY REPORTED AND CALLED VALUE.  CBC     Status: Abnormal   Collection Time: 02/15/16  4:40 AM  Result Value Ref Range   WBC 16.1 (H) 4.0 - 10.5 K/uL   RBC 3.37 (L) 3.87 - 5.11 MIL/uL   Hemoglobin 10.3 (L) 12.0 - 15.0 g/dL   HCT 30.6 (L) 36.0 - 46.0 %   MCV 90.8 78.0 - 100.0 fL   MCH 30.6 26.0 - 34.0 pg   MCHC 33.7 30.0 - 36.0 g/dL   RDW 14.7 11.5 - 15.5 %   Platelets 270 150 - 400 K/uL  Basic metabolic panel     Status: Abnormal   Collection Time: 02/15/16  4:40 AM  Result Value Ref Range   Sodium 134 (L) 135 - 145 mmol/L   Potassium 2.9 (L) 3.5 - 5.1 mmol/L   Chloride 100 (L) 101 - 111 mmol/L   CO2 25 22 - 32 mmol/L   Glucose, Bld 120 (H) 65 - 99 mg/dL   BUN 22 (H) 6 - 20 mg/dL   Creatinine, Ser 1.32 (H) 0.44 - 1.00 mg/dL   Calcium 6.8 (L) 8.9 - 10.3 mg/dL   GFR calc non Af Amer 43 (L) >60 mL/min   GFR calc Af Amer 50 (L) >60 mL/min    Comment: (NOTE) The eGFR has been calculated using the CKD EPI equation. This calculation has not been validated in all clinical situations. eGFR's persistently <60 mL/min signify possible Chronic Kidney Disease.    Anion gap 9 5 - 15  Magnesium     Status: None   Collection Time: 02/15/16  4:40 AM  Result Value Ref Range   Magnesium 1.9 1.7 - 2.4 mg/dL  Phosphorus     Status: Abnormal   Collection Time: 02/15/16  4:40 AM  Result Value Ref Range   Phosphorus 2.1 (L) 2.5 - 4.6 mg/dL  Troponin I     Status: Abnormal   Collection Time: 02/15/16  2:45 PM  Result Value Ref Range   Troponin I 0.70 (HH) <0.03 ng/mL    Comment: DELTA CHECK NOTED CRITICAL RESULT  CALLED TO, READ BACK BY AND VERIFIED WITH: CHURCH,H. RN @ [phone] 02/15/16 BY EDENS,C.   Basic metabolic panel     Status: Abnormal   Collection Time: 02/16/16  5:10 AM  Result Value Ref Range   Sodium 139 135 - 145 mmol/L   Potassium 3.2 (L) 3.5 - 5.1 mmol/L   Chloride 104 101 - 111 mmol/L   CO2 27 22 - 32 mmol/L   Glucose, Bld 108 (H) 65 - 99 mg/dL   BUN 16 6 - 20 mg/dL   Creatinine, Ser 0.80 0.44 - 1.00 mg/dL   Calcium 7.1 (L) 8.9 - 10.3 mg/dL   GFR calc non Af Amer >60 >60 mL/min   GFR calc Af Amer >60 >60 mL/min    Comment: (NOTE) The eGFR has been calculated using the CKD EPI equation. This calculation has not been validated in all clinical situations. eGFR's persistently <60 mL/min signify possible Chronic Kidney Disease.    Anion gap 8 5 - 15  CBC     Status: Abnormal   Collection Time: 02/16/16  5:10 AM  Result Value Ref Range   WBC 15.4 (H) 4.0 - 10.5 K/uL   RBC 3.06 (L) 3.87 - 5.11 MIL/uL   Hemoglobin 9.0 (L) 12.0 - 15.0 g/dL   HCT 27.6 (L) 36.0 - 46.0 %   MCV 90.2 78.0 - 100.0 fL    MCH 29.4 26.0 - 34.0 pg   MCHC 32.6 30.0 - 36.0 g/dL   RDW 14.8 11.5 - 15.5 %   Platelets 266 150 - 400 K/uL  Troponin I     Status: Abnormal   Collection Time: 02/16/16  5:10 AM  Result Value Ref Range   Troponin I 0.05 (HH) <0.03 ng/mL    Comment: DELTA CHECK NOTED CRITICAL VALUE NOTED.  VALUE IS CONSISTENT WITH PREVIOUSLY REPORTED AND CALLED VALUE.     No results found.  Review of Systems  Constitutional: Positive for chills, fever and weight loss.  HENT: Negative for hearing loss.   Eyes: Negative for blurred vision and double vision.  Respiratory: Negative for cough and hemoptysis.   Cardiovascular: Negative for chest pain and palpitations.  Gastrointestinal: Positive for abdominal pain. Negative for nausea and vomiting.  Genitourinary: Negative for dysuria and urgency.  Musculoskeletal: Negative for myalgias and neck pain.  Skin: Negative for itching and rash.  Neurological: Negative for dizziness, tingling and headaches.  Endo/Heme/Allergies: Does not bruise/bleed easily.  Psychiatric/Behavioral: Negative for depression and suicidal ideas.   Blood pressure (!) 104/51, pulse 80, temperature 100.2 F (37.9 C), resp. rate 20, height '5\' 3"'  (1.6 m), weight 129.8 kg (286 lb 1.6 oz), SpO2 95 %. Physical Exam  Nursing note and vitals reviewed. Constitutional: She is oriented to person, place, and time. She appears well-developed and well-nourished.  HENT:  Head: Normocephalic and atraumatic.  Eyes: Conjunctivae and EOM are normal. No scleral icterus.  Neck: Normal range of motion. Neck supple.  Cardiovascular: Normal rate and regular rhythm.   Respiratory: Effort normal and breath sounds normal. She has no wheezes. She has no rales. She exhibits no tenderness.  GI: Soft. She exhibits no distension. There is no tenderness. There is no rebound.  Purulent drainage from right suprapubic area and induration of proximal right thigh  Musculoskeletal: Normal range of motion. She  exhibits no edema.  Neurological: She is alert and oriented to person, place, and time.  Skin: Skin is warm and dry.  Psychiatric: She has a normal mood and affect. Her behavior  is normal.    Assessment/Plan: 60 yo female with morbid obesity and right groin abscess. CT of 11/9 concern ing for area tracking within the mons pubis area. -OR for I+D right groin  Arta Bruce Kinsinger 02/16/2016, 5:00 PM

## 2016-02-16 NOTE — Progress Notes (Addendum)
Pt assisted up to chair and BSC with max 2 person assist, using front wheel for support.  Edward Qualia RN

## 2016-02-16 NOTE — Progress Notes (Signed)
Pt c/o feeling fevered, temp 100.2 PRN Tylenol given.  Edward Qualia RN

## 2016-02-16 NOTE — Transfer of Care (Signed)
Immediate Anesthesia Transfer of Care Note  Patient: Brandy Hardy  Procedure(s) Performed: Procedure(s): IRRIGATION AND DEBRIDEMENT GROIN (Right)  Patient Location: PACU  Anesthesia Type:General  Level of Consciousness: oriented and sedated  Airway & Oxygen Therapy: Patient Spontanous Breathing and Patient connected to nasal cannula oxygen  Post-op Assessment: Report given to RN and Post -op Vital signs reviewed and stable  Post vital signs: Reviewed and stable  Last Vitals:  Vitals:   02/16/16 0325 02/16/16 1556  BP: (!) 104/51   Pulse: 80   Resp: 20   Temp: 37.1 C 37.9 C    Last Pain:  Vitals:   02/16/16 1639  TempSrc:   PainSc: Asleep      Patients Stated Pain Goal: 2 ([id number])  Complications: No apparent anesthesia complications

## 2016-02-16 NOTE — Op Note (Signed)
Preoperative diagnosis: right groin abscess  Postoperative diagnosis: right groin necrotizing fasciitis, right thigh abscess  Procedure: incision and drainage of right thigh abscess, full thickness debridement of 40cm^2 right groin for necrotizing fasciitis Surgeon: Gurney Maxin, M.D.  Asst: none  Anesthesia: general  Indications for procedure: Brandy Hardy is a 59 y.o. year old female with symptoms of fever for 1 week she presented in shock and underwent resuscitation. On CT scan she was found to have a gas forming infection of the right groin. After discussing with the patient she was consented for incision and drainage and brought to surgery.  Description of procedure: The patient was brought into the operative suite. Anesthesia was administered with General endotracheal anesthesia. WHO checklist was applied. The patient was then placed in lithotomy position. The area was prepped and draped in the usual sterile fashion.  Foley catheter attempt was unsuccessful due to resistance  Next an incision was made in the right groin/mons pubis area. Upon getting 4cm deep to skin a large amount of green purulence was drained. A portion of this was sent for culture. A large potion of skin was removed to allow better exposure and wound care. Further exploration showed necrotic tissues of fascia directly superficial to the superior pubic ramus. A large amount of this was debrided with cautery. Hemostasis was then applied with cautery.  Attention then turned to the right thigh, a 3cm incision was made and blunt dissection showed a small pocket of purulence. This was limited to the subcutaneous tissues about 3cm deep to the skin and did not appear to go towards the investing tissues of the thigh. Therefore a counter incision was made a few centimeters away and a 1in penrose drain was passed through the abscess cavity.  Attention turned back to the mons pubis area. No further pockets were found on further  probing. There was a track down through the more internal mons pubis and a counterincision was made in this area. A 1 in penrose was passed through this area and brought out the larger incision. The open space was then packed with kerlex and covered with gauze. The drains were sutured in a loop with a 2-0 nylon. The patient awoke and was brought to pacu in stable condition  Findings: large abscess with concern for necrotizing fasciitis  Specimen: culture of abscess, debrided tissue  Implant: 2 1in penrose drains   Blood loss: 124ml  Local anesthesia: none  Complications: none  Gurney Maxin, M.D. General, Bariatric, & Minimally Invasive Surgery [facility]

## 2016-02-16 NOTE — Progress Notes (Signed)
Pharmacy Antibiotic Note  Brandy Hardy is a 60 y.o. female admitted on 02/14/2016 with sepsis concern for cellulitis - vanc to be re-added  Plan: -Vancomycin 1250 mg IV q12h -Zosyn 3.375G IV q8h to be infused over 4 hours -Trend WBC, temp, renal function -Drug levels as indicated   Height: 5\' 3"  (160 cm) Weight: 286 lb 1.6 oz (129.8 kg) IBW/kg (Calculated) : 52.4  Temp (24hrs), Avg:99.5 F (37.5 C), Min:98.7 F (37.1 C), Max:100.2 F (37.9 C)   Recent Labs Lab 02/14/16 0228 02/14/16 0245 02/14/16 0517 02/14/16 0636 02/14/16 0814 02/14/16 1115 02/15/16 0440 02/16/16 0510  WBC 20.9*  --   --   --   --  22.9* 16.1* 15.4*  CREATININE 1.14*  --   --   --   --  0.84 1.32* 0.80  LATICACIDVEN  --  4.72* 2.16* 1.6 1.2  --   --   --     Estimated Creatinine Clearance: 98.5 mL/min (by C-G formula based on SCr of 0.8 mg/dL).    No Known Allergies  Levester Fresh, PharmD, BCPS, [facility] Clinical Pharmacist Pager [phone] 02/16/2016 4:24 PM

## 2016-02-16 NOTE — Progress Notes (Signed)
[place] TEAM 1 - Stepdown/ICU TEAM  Darika Stubenrauch  JXB:[id number] DOB: 08/30/1955 DOA: 02/14/2016 PCP: Lupe Carney, MD    Brief Narrative:  60 y.o. female who presented to ED for weakness s/p fall without LOC or head trauma. Was experiencing dysuria, suprapubic pain, and urinary frequency for 5 days. Was evaluated at Urgent Care facility and diagnosed with UTI. Was given Rx for Norco, Nitrofurantoin, and Azo. She had taken 3 days of antibiotics. A urine culture was sent but patient is unaware of the results. After starting these medications, dysuria improved but her other symptoms did not.   In the ED, patient was found to be febrile, tachycardic, and hypotensive. Labs were significant for K 2.4, leukocytosis of 20.9, and lactic acid of 4.7. UA appeared consistent with an infectious process.  Code sepsis was called. Patient was given NS 4L bolus without improvement in blood pressure. Therefore, Levophed was initiated. Broad spectrum antibiotics were initiated. Patient required in and out cath in the ED. RN reports seeing thick, white cottage cheese like discharge around the urethra and questionably from the vagina.   Significant Events: 11/9 Admit for septic shock 11/10 [facility] assumed care    Subjective:   denies chest pain shortness of breath nausea or vomiting.  She does admit to right lower quadrant and pelvic area pain   Assessment & Plan:  Septic Shock due to UTI levophed now off - urine culture <10,000 colonies - continue broad-spectrum empiric coverage  Pelvic wall cellulitis w/ ?phlegmon Clinically improving - follow with ongoing broad-spectrum antibiotics - if worsens clinically or shows evidence of persitent infection we'll need to repeat imaging to rule out abscess and consider surgical consultation  AKI improving Due to above (bl Cr 0.6) - 1.32>0.8  Mildly elevated troponin  [facility] - no sx at present to suggest USAP/denies CP  Hypokalemia  Replace and follow -  Mg is normal   Candidal intertrigo   DVT prophylaxis: SQ heparin  Code Status: FULL CODE Family Communication: no family present at time of exam  Disposition Plan:  Due to 3 days  Consultants:  PCCM  Antimicrobials:  Vancomycin 11/8 > 11/10 Zosyn 11/8 >  Objective: Blood pressure (!) 104/51, pulse 80, temperature 98.7 F (37.1 C), temperature source Oral, resp. rate 20, height 5\' 3"  (1.6 m), weight 129.8 kg (286 lb 1.6 oz), SpO2 95 %.  Intake/Output Summary (Last 24 hours) at 02/16/16 1241 Last data filed at 02/15/16 1700  Gross per 24 hour  Intake           543.33 ml  Output                0 ml  Net           543.33 ml   Filed Weights   02/15/16 0313 02/15/16 1749 02/16/16 0325  Weight: 127.1 kg (280 lb 3.3 oz) 119.3 kg (263 lb) 129.8 kg (286 lb 1.6 oz)    Examination: General: No acute respiratory distress Lungs: Clear to auscultation bilaterally without wheezes or crackles Cardiovascular: Regular rate and rhythm without murmur gallop or rub normal S1 and S2 Abdomen: Nontender, nondistended, soft, bowel sounds positive, no rebound, no ascites, no appreciable mass - mild erythema of panus in suprapubic region w/o fluctuance or induration or d/c  Extremities: No significant cyanosis, clubbing, or edema bilateral lower extremities  CBC:  Recent Labs Lab 02/14/16 0228 02/14/16 1115 02/15/16 0440 02/16/16 0510  WBC 20.9* 22.9* 16.1* 15.4*  NEUTROABS 19.9*  --   --   --  HGB 11.5* 10.4* 10.3* 9.0*  HCT 34.5* 31.4* 30.6* 27.6*  MCV 92.5 92.1 90.8 90.2  PLT 287 302 270 266   Basic Metabolic Panel:  Recent Labs Lab 02/14/16 0228 02/14/16 0339 02/14/16 1115 02/15/16 0440 02/16/16 0510  NA 135  --  133* 134* 139  K 2.4*  --  2.9* 2.9* 3.2*  CL 94*  --  98* 100* 104  CO2 26  --  26 25 27   GLUCOSE 108*  --  166* 120* 108*  BUN 20  --  15 22* 16  CREATININE 1.14*  --  0.84 1.32* 0.80  CALCIUM 7.8*  --  6.9* 6.8* 7.1*  MG  --  1.9 1.7 1.9  --   PHOS  --    --  1.6* 2.1*  --    GFR: Estimated Creatinine Clearance: 98.5 mL/min (by C-G formula based on SCr of 0.8 mg/dL).  Liver Function Tests:  Recent Labs Lab 02/14/16 0228  AST 83*  ALT 46  ALKPHOS 118  BILITOT 0.9  PROT 5.7*  ALBUMIN 1.6*   Cardiac Enzymes:  Recent Labs Lab 02/14/16 0636 02/14/16 1115 02/14/16 1715 02/15/16 1445 02/16/16 0510  TROPONINI 0.05* 0.03* 0.11* 0.70* 0.05*    HbA1C: Hgb A1c MFr Bld  Date/Time Value Ref Range Status  10/08/2015 10:36 AM 5.6 4.6 - 6.5 % Final    Comment:    Glycemic Control Guidelines for People with Diabetes:Non Diabetic:  <6%Goal of Therapy: <7%Additional Action Suggested:  >8%   02/27/2015 08:19 AM 5.7 4.6 - 6.5 % Final    Comment:    Glycemic Control Guidelines for People with Diabetes:Non Diabetic:  <6%Goal of Therapy: <7%Additional Action Suggested:  >8%     Recent Results (from the past 240 hour(s))  Urine culture     Status: Abnormal   Collection Time: 02/14/16  2:28 AM  Result Value Ref Range Status   Specimen Description URINE, RANDOM  Final   Special Requests NONE  Final   Culture <10,000 COLONIES/mL INSIGNIFICANT GROWTH (A)  Final   Report Status 02/15/2016 FINAL  Final  Blood Culture (routine x 2)     Status: None (Preliminary result)   Collection Time: 02/14/16  2:40 AM  Result Value Ref Range Status   Specimen Description BLOOD LEFT ARM  Final   Special Requests BOTTLES DRAWN AEROBIC AND ANAEROBIC  Final   Culture NO GROWTH 2 DAYS  Final   Report Status PENDING  Incomplete  MRSA PCR Screening     Status: None   Collection Time: 02/14/16  6:04 AM  Result Value Ref Range Status   MRSA by PCR NEGATIVE NEGATIVE Final    Comment:        The GeneXpert MRSA Assay (FDA approved for NASAL specimens only), is one component of a comprehensive MRSA colonization surveillance program. It is not intended to diagnose MRSA infection nor to guide or monitor treatment for MRSA infections.      Scheduled  Meds: . fluconazole (DIFLUCAN) IV  200 mg Intravenous Q24H  . heparin  5,000 Units Subcutaneous Q8H  . pantoprazole  40 mg Oral Daily  . piperacillin-tazobactam (ZOSYN)  IV  3.375 g Intravenous Q8H   Continuous Infusions: . 0.9 % NaCl with KCl 40 mEq / L       LOS: 2 days     Office  [phone] Pager - Text Page per Loretha Stapler as per below:  On-Call/Text Page:      [email or web address]  password [email or web address]  If 7PM-7AM, please contact night-coverage [email or web address] Password [email or web address] 02/16/2016, 12:41 PM

## 2016-02-16 NOTE — Progress Notes (Signed)
Swollen red area noted to perineum and upper thigh, painful with prurient drainage, Dr Allyson Sabal notified, will continue to monitor closely.  Edward Qualia RN

## 2016-02-16 NOTE — Anesthesia Postprocedure Evaluation (Signed)
Anesthesia Post Note  Patient: Brandy Hardy  Procedure(s) Performed: Procedure(s) (LRB): IRRIGATION AND DEBRIDEMENT RIGHT GROIN ABSCESS, FULL THICKNESS DEBRIDMENT RIGHT GROIN INCLUDING > 40CM SQUARE. (Right)  Patient location during evaluation: PACU Anesthesia Type: General Level of consciousness: awake and alert Pain management: pain level controlled Vital Signs Assessment: post-procedure vital signs reviewed and stable Respiratory status: spontaneous breathing, nonlabored ventilation, respiratory function stable and patient connected to nasal cannula oxygen Cardiovascular status: blood pressure returned to baseline and stable Postop Assessment: no signs of nausea or vomiting Anesthetic complications: no    Last Vitals:  Vitals:   02/16/16 1934 02/16/16 1953  BP: 119/60 (!) 111/54  Pulse: 95 95  Resp: (!) 22 (!) 22  Temp: 36.5 C 36.9 C    Last Pain:  Vitals:   02/16/16 1953  TempSrc: Oral  PainSc:                  Catalina Gravel

## 2016-02-16 NOTE — Anesthesia Preprocedure Evaluation (Signed)
Anesthesia Evaluation  Patient identified by MRN, date of birth, ID band Patient awake    Reviewed: Allergy & Precautions, NPO status , Patient's Chart, lab work & pertinent test results  Airway Mallampati: III  TM Distance: >3 FB Neck ROM: Full    Dental  (+) Teeth Intact, Dental Advisory Given   Pulmonary neg pulmonary ROS,    Pulmonary exam normal breath sounds clear to auscultation       Cardiovascular hypertension, Normal cardiovascular exam Rhythm:Regular Rate:Normal     Neuro/Psych negative neurological ROS     GI/Hepatic negative GI ROS, Neg liver ROS,   Endo/Other    Renal/GU negative Renal ROS     Musculoskeletal negative musculoskeletal ROS (+)   Abdominal   Peds  Hematology  (+) Blood dyscrasia, anemia ,   Anesthesia Other Findings Day of surgery medications reviewed with the patient.  Groin abscess, septic shock  Reproductive/Obstetrics Cervical cancer s/p chemo/radiation                             Anesthesia Physical Anesthesia Plan  ASA: III and emergent  Anesthesia Plan: General   Post-op Pain Management:    Induction: Intravenous, Rapid sequence and Cricoid pressure planned  Airway Management Planned: Oral ETT  Additional Equipment:   Intra-op Plan:   Post-operative Plan: Extubation in OR  Informed Consent: I have reviewed the patients History and Physical, chart, labs and discussed the procedure including the risks, benefits and alternatives for the proposed anesthesia with the patient or authorized representative who has indicated his/her understanding and acceptance.   Dental advisory given  Plan Discussed with: CRNA  Anesthesia Plan Comments: (Risks/benefits of general anesthesia discussed with patient including risk of damage to teeth, lips, gum, and tongue, nausea/vomiting, allergic reactions to medications, and the possibility of heart attack,  stroke and death.  All patient questions answered.  Patient wishes to proceed.)        Anesthesia Quick Evaluation

## 2016-02-16 NOTE — Anesthesia Procedure Notes (Signed)
Procedure Name: Intubation Date/Time: 02/16/2016 5:53 PM Performed by: Marinda Elk A Pre-anesthesia Checklist: Patient identified, Emergency Drugs available, Suction available, Patient being monitored and Timeout performed Patient Re-evaluated:Patient Re-evaluated prior to inductionOxygen Delivery Method: Circle System Utilized and Circle system utilized Preoxygenation: Pre-oxygenation with 100% oxygen Intubation Type: IV induction, Rapid sequence and Cricoid Pressure applied Laryngoscope Size: Glidescope Grade View: Grade I Tube type: Oral Tube size: 7.5 mm Number of attempts: 1 Airway Equipment and Method: Stylet and Oral airway Placement Confirmation: ETT inserted through vocal cords under direct vision,  positive ETCO2 and breath sounds checked- equal and bilateral Secured at: 22 cm Tube secured with: Tape Dental Injury: Teeth and Oropharynx as per pre-operative assessment

## 2016-02-17 ENCOUNTER — Inpatient Hospital Stay (HOSPITAL_COMMUNITY): Payer: Managed Care, Other (non HMO)

## 2016-02-17 ENCOUNTER — Encounter (HOSPITAL_COMMUNITY): Payer: Self-pay | Admitting: General Surgery

## 2016-02-17 DIAGNOSIS — R079 Chest pain, unspecified: Secondary | ICD-10-CM

## 2016-02-17 LAB — COMPREHENSIVE METABOLIC PANEL
ALBUMIN: 1.1 g/dL — AB (ref 3.5–5.0)
ALT: 43 U/L (ref 14–54)
ANION GAP: 7 (ref 5–15)
AST: 59 U/L — AB (ref 15–41)
Alkaline Phosphatase: 93 U/L (ref 38–126)
BILIRUBIN TOTAL: 1 mg/dL (ref 0.3–1.2)
BUN: 7 mg/dL (ref 6–20)
CHLORIDE: 103 mmol/L (ref 101–111)
CO2: 28 mmol/L (ref 22–32)
Calcium: 7.2 mg/dL — ABNORMAL LOW (ref 8.9–10.3)
Creatinine, Ser: 0.6 mg/dL (ref 0.44–1.00)
GFR calc Af Amer: >60 mL/min (ref >60)
GFR calc non Af Amer: >60 mL/min (ref >60)
GLUCOSE: 108 mg/dL — AB (ref 65–99)
POTASSIUM: 3.2 mmol/L — AB (ref 3.5–5.1)
SODIUM: 138 mmol/L (ref 135–145)
TOTAL PROTEIN: 4.3 g/dL — AB (ref 6.5–8.1)

## 2016-02-17 LAB — CBC
HEMATOCRIT: 28.9 % — AB (ref 36.0–46.0)
HEMOGLOBIN: 9.6 g/dL — AB (ref 12.0–15.0)
MCH: 30.4 pg (ref 26.0–34.0)
MCHC: 33.2 g/dL (ref 30.0–36.0)
MCV: 91.5 fL (ref 78.0–100.0)
Platelets: 243 10*3/uL (ref 150–400)
RBC: 3.16 MIL/uL — ABNORMAL LOW (ref 3.87–5.11)
RDW: 14.9 % (ref 11.5–15.5)
WBC: 16.4 10*3/uL — ABNORMAL HIGH (ref 4.0–10.5)

## 2016-02-17 LAB — BLOOD CULTURE ID PANEL (REFLEXED)
Acinetobacter baumannii: NOT DETECTED
CANDIDA GLABRATA: NOT DETECTED
CANDIDA KRUSEI: NOT DETECTED
CANDIDA PARAPSILOSIS: NOT DETECTED
CANDIDA TROPICALIS: NOT DETECTED
Candida albicans: NOT DETECTED
ENTEROBACTER CLOACAE COMPLEX: NOT DETECTED
ESCHERICHIA COLI: NOT DETECTED
Enterobacteriaceae species: NOT DETECTED
Enterococcus species: NOT DETECTED
Haemophilus influenzae: NOT DETECTED
KLEBSIELLA OXYTOCA: NOT DETECTED
KLEBSIELLA PNEUMONIAE: NOT DETECTED
Listeria monocytogenes: NOT DETECTED
Neisseria meningitidis: NOT DETECTED
PROTEUS SPECIES: NOT DETECTED
Pseudomonas aeruginosa: NOT DETECTED
STAPHYLOCOCCUS SPECIES: NOT DETECTED
Serratia marcescens: NOT DETECTED
Staphylococcus aureus (BCID): NOT DETECTED
Streptococcus agalactiae: NOT DETECTED
Streptococcus pneumoniae: NOT DETECTED
Streptococcus pyogenes: NOT DETECTED
Streptococcus species: NOT DETECTED

## 2016-02-17 LAB — ECHOCARDIOGRAM COMPLETE
HEIGHTINCHES: 63 in
WEIGHTICAEL: 4609.6 [oz_av]

## 2016-02-17 MED ORDER — FLUCONAZOLE 200 MG PO TABS
200.0000 mg | ORAL_TABLET | Freq: Every day | ORAL | Status: DC
  Administered 2016-02-18 – 2016-02-22 (×5): 200 mg via ORAL
  Filled 2016-02-17 (×5): qty 1

## 2016-02-17 NOTE — Progress Notes (Signed)
S: Doing well today. Pain controlled.  Vitals, labs, intake/output, and orders reviewed at this time. Tm 99.1. WBC still elev at 16.4  Gen: A&Ox3, no distress  H&N: EOMI, atraumatic, neck supple Chest: unlabored respirations, RRR Abd: soft, nontender, obese Ext: warm, no edema Neuro: grossly normal Skin: Wound is clean and hemostatic. Packing removed with some purulence/malodorous drainage on the deepest aspect. Minimal surrounding cellulitis. Penrose x 2 in place to mons wound and right thigh wound. New saline moistened kerlix x 1 loosely packed into mons wound.   Lines/tubes/drains: PIV  A/P:  POD 1 s/p I&D mons pubis and right thigh soft tissue infections.  -Daily packing changes; may be able to vac the mons wound next week -Continue IV abx -Will continue to follow   Romana Juniper, MD [facility], [place] Pager [phone]

## 2016-02-17 NOTE — Progress Notes (Signed)
PHARMACY - PHYSICIAN COMMUNICATION CRITICAL VALUE ALERT - BLOOD CULTURE IDENTIFICATION (BCID)  Results for orders placed or performed during the hospital encounter of 02/14/16  Blood Culture ID Panel (Reflexed) (Collected: 02/14/2016  2:40 AM)  Result Value Ref Range   Enterococcus species NOT DETECTED NOT DETECTED   Listeria monocytogenes NOT DETECTED NOT DETECTED   Staphylococcus species NOT DETECTED NOT DETECTED   Staphylococcus aureus NOT DETECTED NOT DETECTED   Streptococcus species NOT DETECTED NOT DETECTED   Streptococcus agalactiae NOT DETECTED NOT DETECTED   Streptococcus pneumoniae NOT DETECTED NOT DETECTED   Streptococcus pyogenes NOT DETECTED NOT DETECTED   Acinetobacter baumannii NOT DETECTED NOT DETECTED   Enterobacteriaceae species NOT DETECTED NOT DETECTED   Enterobacter cloacae complex NOT DETECTED NOT DETECTED   Escherichia coli NOT DETECTED NOT DETECTED   Klebsiella oxytoca NOT DETECTED NOT DETECTED   Klebsiella pneumoniae NOT DETECTED NOT DETECTED   Proteus species NOT DETECTED NOT DETECTED   Serratia marcescens NOT DETECTED NOT DETECTED   Haemophilus influenzae NOT DETECTED NOT DETECTED   Neisseria meningitidis NOT DETECTED NOT DETECTED   Pseudomonas aeruginosa NOT DETECTED NOT DETECTED   Candida albicans NOT DETECTED NOT DETECTED   Candida glabrata NOT DETECTED NOT DETECTED   Candida krusei NOT DETECTED NOT DETECTED   Candida parapsilosis NOT DETECTED NOT DETECTED   Candida tropicalis NOT DETECTED NOT DETECTED    Name of physician (or Provider) Contacted: Dr Allyson Sabal  Changes to prescribed antibiotics required: Continue current abx - gram + cocci in wound cx alone with GNR  Levester Fresh, PharmD, BCPS, [facility] Clinical Pharmacist Pager [phone] 02/17/2016 9:26 AM

## 2016-02-17 NOTE — Progress Notes (Signed)
[facility] TEAM 1 - Stepdown/ICU TEAM  Elah Gallman  ZOX:[id number] DOB: October 22, 1955 DOA: 02/14/2016 PCP: Lupe Carney, MD    Brief Narrative:  60 y.o. female who presented to ED for weakness s/p fall without LOC or head trauma. Was experiencing dysuria, suprapubic pain, and urinary frequency for 5 days. Was evaluated at Urgent Care facility and diagnosed with UTI. Was given Rx for Norco, Nitrofurantoin, and Azo. She had taken 3 days of antibiotics. A urine culture was sent but patient is unaware of the results. After starting these medications, dysuria improved but her other symptoms did not.   In the ED, patient was found to be febrile, tachycardic, and hypotensive. Labs were significant for K 2.4, leukocytosis of 20.9, and lactic acid of 4.7. UA appeared consistent with an infectious process.  Code sepsis was called. Patient was given NS 4L bolus without improvement in blood pressure. Therefore, Levophed was initiated. Broad spectrum antibiotics were initiated. Patient required in and out cath in the ED. RN reports seeing thick, white cottage cheese like discharge around the urethra and questionably from the vagina.   Significant Events: 11/9 Admit for septic shock 11/10 [facility] assumed care    Subjective:   denies chest pain shortness of breath nausea or vomiting.  She does admit to right lower quadrant and pelvic area pain   Assessment & Plan:  Septic Shock due to UTI levophed now off - urine culture <10,000 colonies - continue broad-spectrum empiric coverage  Pelvic abscess /necrotizing fasciitis Continue broad-spectrum antibiotics -added vancomycin to Zosyn and fluconazole on 11/11. Gen. surgery consulted.  Status post incision and drainage of right thigh abscess, full thickness debridement of 40cm^2 right groin for necrotizing fasciitis 11/11. Gram stain shows abundant gram-positive cocci in pairs and gram-negative rods.   AKI improving Due to above (bl Cr 0.6) -  1.32>0.8>0.6  Mildly elevated troponin  Follow to [facility] - no sx at present to suggest USAP/denies CP 2-D echo to rule out wall motion abnormalities  Hypokalemia  Replace and follow - Mg is normal   Candidal intertrigo -patient on fluconazole   Morbid obesity Body mass index is 51.03 kg/m.   DVT prophylaxis: SQ heparin  Code Status: FULL CODE Family Communication: no family present at time of exam  Disposition Plan:  2-3 days   Consultants:  PCCM General surgery   Antimicrobials:  Vancomycin 11/8 > 11/10 Zosyn 11/8 >  Objective: Blood pressure (!) 116/52, pulse 76, temperature 99 F (37.2 C), temperature source Oral, resp. rate 20, height 5\' 3"  (1.6 m), weight 130.7 kg (288 lb 1.6 oz), SpO2 94 %.  Intake/Output Summary (Last 24 hours) at 02/17/16 0833 Last data filed at 02/16/16 1916  Gross per 24 hour  Intake            887.5 ml  Output               25 ml  Net            862.5 ml   Filed Weights   02/15/16 1749 02/16/16 0325 02/17/16 0518  Weight: 119.3 kg (263 lb) 129.8 kg (286 lb 1.6 oz) 130.7 kg (288 lb 1.6 oz)    Examination: General: No acute respiratory distress Lungs: Clear to auscultation bilaterally without wheezes or crackles Cardiovascular: Regular rate and rhythm without murmur gallop or rub normal S1 and S2 Abdomen: Nontender, nondistended, soft, bowel sounds positive, no rebound, no ascites, no appreciable mass - mild erythema of panus in suprapubic region w/o fluctuance  or induration or d/c  Extremities: No significant cyanosis, clubbing, or edema bilateral lower extremities  CBC:  Recent Labs Lab 02/14/16 0228 02/14/16 1115 02/15/16 0440 02/16/16 0510 02/17/16 0500  WBC 20.9* 22.9* 16.1* 15.4* 16.4*  NEUTROABS 19.9*  --   --   --   --   HGB 11.5* 10.4* 10.3* 9.0* 9.6*  HCT 34.5* 31.4* 30.6* 27.6* 28.9*  MCV 92.5 92.1 90.8 90.2 91.5  PLT 287 302 270 266 243   Basic Metabolic Panel:  Recent Labs Lab 02/14/16 0228 02/14/16 0339  02/14/16 1115 02/15/16 0440 02/16/16 0510 02/17/16 0500  NA 135  --  133* 134* 139 138  K 2.4*  --  2.9* 2.9* 3.2* 3.2*  CL 94*  --  98* 100* 104 103  CO2 26  --  26 25 27 28   GLUCOSE 108*  --  166* 120* 108* 108*  BUN 20  --  15 22* 16 7  CREATININE 1.14*  --  0.84 1.32* 0.80 0.60  CALCIUM 7.8*  --  6.9* 6.8* 7.1* 7.2*  MG  --  1.9 1.7 1.9  --   --   PHOS  --   --  1.6* 2.1*  --   --    GFR: Estimated Creatinine Clearance: 98.8 mL/min (by C-G formula based on SCr of 0.6 mg/dL).  Liver Function Tests:  Recent Labs Lab 02/14/16 0228 02/17/16 0500  AST 83* 59*  ALT 46 43  ALKPHOS 118 93  BILITOT 0.9 1.0  PROT 5.7* 4.3*  ALBUMIN 1.6* 1.1*   Cardiac Enzymes:  Recent Labs Lab 02/14/16 0636 02/14/16 1115 02/14/16 1715 02/15/16 1445 02/16/16 0510  TROPONINI 0.05* 0.03* 0.11* 0.70* 0.05*    HbA1C: Hgb A1c MFr Bld  Date/Time Value Ref Range Status  10/08/2015 10:36 AM 5.6 4.6 - 6.5 % Final    Comment:    Glycemic Control Guidelines for People with Diabetes:Non Diabetic:  <6%Goal of Therapy: <7%Additional Action Suggested:  >8%   02/27/2015 08:19 AM 5.7 4.6 - 6.5 % Final    Comment:    Glycemic Control Guidelines for People with Diabetes:Non Diabetic:  <6%Goal of Therapy: <7%Additional Action Suggested:  >8%     Recent Results (from the past 240 hour(s))  Urine culture     Status: Abnormal   Collection Time: 02/14/16  2:28 AM  Result Value Ref Range Status   Specimen Description URINE, RANDOM  Final   Special Requests NONE  Final   Culture <10,000 COLONIES/mL INSIGNIFICANT GROWTH (A)  Final   Report Status 02/15/2016 FINAL  Final  Blood Culture (routine x 2)     Status: None (Preliminary result)   Collection Time: 02/14/16  2:40 AM  Result Value Ref Range Status   Specimen Description BLOOD LEFT ARM  Final   Special Requests BOTTLES DRAWN AEROBIC AND ANAEROBIC  Final   Culture  Setup Time   Final    GRAM NEGATIVE RODS ANAEROBIC BOTTLE ONLY Organism ID  to follow    Culture PENDING  Incomplete   Report Status PENDING  Incomplete  MRSA PCR Screening     Status: None   Collection Time: 02/14/16  6:04 AM  Result Value Ref Range Status   MRSA by PCR NEGATIVE NEGATIVE Final    Comment:        The GeneXpert MRSA Assay (FDA approved for NASAL specimens only), is one component of a comprehensive MRSA colonization surveillance program. It is not intended to diagnose MRSA infection nor to  guide or monitor treatment for MRSA infections.   Surgical pcr screen     Status: None   Collection Time: 02/16/16  5:00 PM  Result Value Ref Range Status   MRSA, PCR NEGATIVE NEGATIVE Final   Staphylococcus aureus NEGATIVE NEGATIVE Final    Comment:        The Xpert SA Assay (FDA approved for NASAL specimens in patients over 71 years of age), is one component of a comprehensive surveillance program.  Test performance has been validated by [facility] for patients greater than or equal to 54 year old. It is not intended to diagnose infection nor to guide or monitor treatment.   Aerobic/Anaerobic Culture (surgical/deep wound)     Status: None (Preliminary result)   Collection Time: 02/16/16  7:52 PM  Result Value Ref Range Status   Specimen Description ABSCESS GROIN  Final   Special Requests NONE  Final   Gram Stain   Final    ABUNDANT WBC PRESENT, PREDOMINANTLY PMN RARE GRAM POSITIVE COCCI IN PAIRS RARE GRAM NEGATIVE RODS    Culture PENDING  Incomplete   Report Status PENDING  Incomplete     Scheduled Meds: . fluconazole (DIFLUCAN) IV  200 mg Intravenous Q24H  . heparin  5,000 Units Subcutaneous Q8H  . pantoprazole  40 mg Oral Daily  . piperacillin-tazobactam (ZOSYN)  IV  3.375 g Intravenous Q8H  . sodium chloride  500 mL Intravenous Once  . vancomycin  1,250 mg Intravenous Q12H   Continuous Infusions: . 0.9 % NaCl with KCl 40 mEq / L 75 mL/hr (02/17/16 0401)     LOS: 3 days     Office  [phone] Pager - Text Page per  Loretha Stapler as per below:  On-Call/Text Page:      [email or web address]      password [email or web address]  If 7PM-7AM, please contact night-coverage [email or web address] Password [email or web address] 02/17/2016, 8:33 AM

## 2016-02-17 NOTE — Progress Notes (Signed)
Echocardiogram 2D Echocardiogram has been performed.  Aggie Cosier 02/17/2016, 2:16 PM

## 2016-02-18 DIAGNOSIS — A419 Sepsis, unspecified organism: Secondary | ICD-10-CM

## 2016-02-18 LAB — COMPREHENSIVE METABOLIC PANEL
ALT: 33 U/L (ref 14–54)
ANION GAP: 6 (ref 5–15)
AST: 43 U/L — ABNORMAL HIGH (ref 15–41)
Albumin: 1 g/dL — ABNORMAL LOW (ref 3.5–5.0)
Alkaline Phosphatase: 86 U/L (ref 38–126)
BUN: 7 mg/dL (ref 6–20)
CHLORIDE: 103 mmol/L (ref 101–111)
CO2: 29 mmol/L (ref 22–32)
Calcium: 7.1 mg/dL — ABNORMAL LOW (ref 8.9–10.3)
Creatinine, Ser: 0.59 mg/dL (ref 0.44–1.00)
Glucose, Bld: 123 mg/dL — ABNORMAL HIGH (ref 65–99)
POTASSIUM: 3.7 mmol/L (ref 3.5–5.1)
Sodium: 138 mmol/L (ref 135–145)
Total Bilirubin: 0.6 mg/dL (ref 0.3–1.2)
Total Protein: 4 g/dL — ABNORMAL LOW (ref 6.5–8.1)

## 2016-02-18 LAB — CBC
HCT: 27.3 % — ABNORMAL LOW (ref 36.0–46.0)
Hemoglobin: 8.7 g/dL — ABNORMAL LOW (ref 12.0–15.0)
MCH: 29.8 pg (ref 26.0–34.0)
MCHC: 31.9 g/dL (ref 30.0–36.0)
MCV: 93.5 fL (ref 78.0–100.0)
PLATELETS: 283 10*3/uL (ref 150–400)
RBC: 2.92 MIL/uL — ABNORMAL LOW (ref 3.87–5.11)
RDW: 15 % (ref 11.5–15.5)
WBC: 15.8 10*3/uL — AB (ref 4.0–10.5)

## 2016-02-18 LAB — MAGNESIUM: MAGNESIUM: 1.8 mg/dL (ref 1.7–2.4)

## 2016-02-18 MED ORDER — ENSURE ENLIVE PO LIQD
237.0000 mL | Freq: Three times a day (TID) | ORAL | Status: DC
  Administered 2016-02-18 – 2016-02-19 (×3): 237 mL via ORAL

## 2016-02-18 MED ORDER — ADULT MULTIVITAMIN W/MINERALS CH
1.0000 | ORAL_TABLET | Freq: Every day | ORAL | Status: DC
  Administered 2016-02-18 – 2016-02-22 (×5): 1 via ORAL
  Filled 2016-02-18 (×5): qty 1

## 2016-02-18 NOTE — Progress Notes (Signed)
[facility] TEAM 1 - Stepdown/ICU TEAM  Maryanna Dudik  ATF:[id number] DOB: December 14, 1955 DOA: 02/14/2016 PCP: Lupe Carney, MD    Brief Narrative:  60 y.o. female who presented to ED for weakness s/p fall without LOC or head trauma. Was experiencing dysuria, suprapubic pain, and urinary frequency for 5 days. Was evaluated at Urgent Care facility and diagnosed with UTI. Was given Rx for Norco, Nitrofurantoin, and Azo. She had taken 3 days of antibiotics. A urine culture was sent but patient is unaware of the results. After starting these medications, dysuria improved but her other symptoms did not.   In the ED, patient was found to be febrile, tachycardic, and hypotensive. Labs were significant for K 2.4, leukocytosis of 20.9, and lactic acid of 4.7. UA appeared consistent with an infectious process.  Code sepsis was called. Patient was given NS 4L bolus without improvement in blood pressure. Therefore, Levophed was initiated. Broad spectrum antibiotics were initiated. Patient required in and out cath in the ED. RN reports seeing thick, white cottage cheese like discharge around the urethra and questionably from the vagina.   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 Gen. surgery consulted.  Status post incision and drainage of right thigh abscess, full thickness debridement of 40cm^2 right groin for necrotizing fasciitis 11/11. Gram stain shows abundant gram-positive cocci in pairs and gram-negative rods. Appreciate surgery input.  Continue daily packing changes.   AKI improving Due to above (bl Cr 0.6) -  1.32>0.8>0.6  Severe protein calorie malnutrition-albumin 1.1, will order nutrition consult  Mildly elevated troponin  Follow to [facility] - no sx at present to suggest USAP/denies CP 2-D echo  showed EF of 65-70%, wall motion was normal, no regional wall motion abnormalities,  Hypokalemia  Replace and follow - Mg is normal   Candidal intertrigo -patient on fluconazole   Morbid obesity Body mass index is 52.15 kg/m.   DVT prophylaxis: SQ heparin  Code Status: FULL CODE Family Communication: no family present at time of exam  Disposition Plan:  Final disposition will be per general surgery  Consultants:  PCCM General surgery   Antimicrobials:  Vancomycin 11/8 > 11/10, 11/11> Zosyn 11/8 >  Objective: Blood pressure (!) 106/56, pulse 86, temperature 98.3 F (36.8 C), temperature source Oral, resp. rate (!) 21, height 5\' 3"  (1.6 m), weight 133.5 kg (294 lb 6.4 oz), SpO2 94 %.  Intake/Output Summary (Last 24 hours) at 02/18/16 0820 Last data filed at 02/18/16 0516  Gross per 24 hour  Intake             2145 ml  Output                0 ml  Net             2145 ml   Filed Weights   02/16/16 0325 02/17/16 0518 02/18/16 0511  Weight: 129.8 kg (286 lb 1.6 oz) 130.7 kg (288 lb 1.6 oz) 133.5 kg (294 lb 6.4 oz)    Examination: General: No acute respiratory distress Lungs: Clear to auscultation bilaterally without wheezes or crackles Cardiovascular: Regular rate and rhythm without murmur gallop or rub normal S1 and S2  Abdomen: Nontender, nondistended, soft, bowel sounds positive, no rebound, no ascites, no appreciable mass - mild erythema of panus in suprapubic region w/o fluctuance or induration or d/c  Extremities: No significant cyanosis, clubbing, or edema bilateral lower extremities  CBC:  Recent Labs Lab 02/14/16 0228 02/14/16 1115 02/15/16 0440 02/16/16 0510 02/17/16 0500  WBC 20.9* 22.9* 16.1* 15.4* 16.4*  NEUTROABS 19.9*  --   --   --   --   HGB 11.5* 10.4*  10.3* 9.0* 9.6*  HCT 34.5* 31.4* 30.6* 27.6* 28.9*  MCV 92.5 92.1 90.8 90.2 91.5  PLT 287 302 270 266 243   Basic Metabolic Panel:  Recent Labs Lab 02/14/16 0228 02/14/16 0339 02/14/16 1115 02/15/16 0440 02/16/16 0510 02/17/16 0500  NA 135  --  133* 134* 139 138  K 2.4*  --  2.9* 2.9* 3.2* 3.2*  CL 94*  --  98* 100* 104 103  CO2 26  --  26 25 27 28   GLUCOSE 108*  --  166* 120* 108* 108*  BUN 20  --  15 22* 16 7  CREATININE 1.14*  --  0.84 1.32* 0.80 0.60  CALCIUM 7.8*  --  6.9* 6.8* 7.1* 7.2*  MG  --  1.9 1.7 1.9  --   --   PHOS  --   --  1.6* 2.1*  --   --    GFR: Estimated Creatinine Clearance: 100.1 mL/min (by C-G formula based on SCr of 0.6 mg/dL).  Liver Function Tests:  Recent Labs Lab 02/14/16 0228 02/17/16 0500  AST 83* 59*  ALT 46 43  ALKPHOS 118 93  BILITOT 0.9 1.0  PROT 5.7* 4.3*  ALBUMIN 1.6* 1.1*   Cardiac Enzymes:  Recent Labs Lab 02/14/16 0636 02/14/16 1115 02/14/16 1715 02/15/16 1445 02/16/16 0510  TROPONINI 0.05* 0.03* 0.11* 0.70* 0.05*    HbA1C: Hgb A1c MFr Bld  Date/Time Value Ref Range Status  10/08/2015 10:36 AM 5.6 4.6 - 6.5 % Final    Comment:    Glycemic Control Guidelines for People with Diabetes:Non Diabetic:  <6%Goal of Therapy: <7%Additional Action Suggested:  >8%   02/27/2015 08:19 AM 5.7 4.6 - 6.5 % Final    Comment:    Glycemic Control Guidelines for People with Diabetes:Non Diabetic:  <6%Goal of Therapy: <7%Additional Action Suggested:  >8%     Recent Results (from the past 240 hour(s))  Urine culture     Status: Abnormal   Collection Time: 02/14/16  2:28 AM  Result Value Ref Range Status   Specimen Description URINE, RANDOM  Final   Special Requests NONE  Final   Culture <10,000 COLONIES/mL INSIGNIFICANT GROWTH (A)  Final   Report Status 02/15/2016 FINAL  Final  Blood Culture (routine x 2)     Status: None (Preliminary result)   Collection Time: 02/14/16  2:40 AM  Result Value Ref Range Status   Specimen  Description BLOOD LEFT ARM  Final   Special Requests BOTTLES DRAWN AEROBIC AND ANAEROBIC  Final   Culture  Setup Time   Final    GRAM NEGATIVE RODS ANAEROBIC BOTTLE ONLY CRITICAL RESULT CALLED TO, READ BACK BY AND VERIFIED WITH: Hollie Beach AT 4098 02/17/16 BY L BENFIELD    Culture GRAM NEGATIVE RODS  Final   Report Status PENDING  Incomplete  Blood Culture ID Panel (Reflexed)     Status: None   Collection Time: 02/14/16  2:40 AM  Result Value Ref Range Status   Enterococcus species NOT DETECTED NOT  DETECTED Final   Listeria monocytogenes NOT DETECTED NOT DETECTED Final   Staphylococcus species NOT DETECTED NOT DETECTED Final   Staphylococcus aureus NOT DETECTED NOT DETECTED Final   Streptococcus species NOT DETECTED NOT DETECTED Final   Streptococcus agalactiae NOT DETECTED NOT DETECTED Final   Streptococcus pneumoniae NOT DETECTED NOT DETECTED Final   Streptococcus pyogenes NOT DETECTED NOT DETECTED Final   Acinetobacter baumannii NOT DETECTED NOT DETECTED Final   Enterobacteriaceae species NOT DETECTED NOT DETECTED Final   Enterobacter cloacae complex NOT DETECTED NOT DETECTED Final   Escherichia coli NOT DETECTED NOT DETECTED Final   Klebsiella oxytoca NOT DETECTED NOT DETECTED Final   Klebsiella pneumoniae NOT DETECTED NOT DETECTED Final   Proteus species NOT DETECTED NOT DETECTED Final   Serratia marcescens NOT DETECTED NOT DETECTED Final   Haemophilus influenzae NOT DETECTED NOT DETECTED Final   Neisseria meningitidis NOT DETECTED NOT DETECTED Final   Pseudomonas aeruginosa NOT DETECTED NOT DETECTED Final   Candida albicans NOT DETECTED NOT DETECTED Final   Candida glabrata NOT DETECTED NOT DETECTED Final   Candida krusei NOT DETECTED NOT DETECTED Final   Candida parapsilosis NOT DETECTED NOT DETECTED Final   Candida tropicalis NOT DETECTED NOT DETECTED Final  MRSA PCR Screening     Status: None   Collection Time: 02/14/16  6:04 AM  Result Value Ref Range Status    MRSA by PCR NEGATIVE NEGATIVE Final    Comment:        The GeneXpert MRSA Assay (FDA approved for NASAL specimens only), is one component of a comprehensive MRSA colonization surveillance program. It is not intended to diagnose MRSA infection nor to guide or monitor treatment for MRSA infections.   Surgical pcr screen     Status: None   Collection Time: 02/16/16  5:00 PM  Result Value Ref Range Status   MRSA, PCR NEGATIVE NEGATIVE Final   Staphylococcus aureus NEGATIVE NEGATIVE Final    Comment:        The Xpert SA Assay (FDA approved for NASAL specimens in patients over 22 years of age), is one component of a comprehensive surveillance program.  Test performance has been validated by [facility] for patients greater than or equal to 57 year old. It is not intended to diagnose infection nor to guide or monitor treatment.   Aerobic/Anaerobic Culture (surgical/deep wound)     Status: None (Preliminary result)   Collection Time: 02/16/16  7:52 PM  Result Value Ref Range Status   Specimen Description ABSCESS GROIN  Final   Special Requests NONE  Final   Gram Stain   Final    ABUNDANT WBC PRESENT, PREDOMINANTLY PMN RARE GRAM POSITIVE COCCI IN PAIRS RARE GRAM NEGATIVE RODS    Culture NO GROWTH 1 DAY  Final   Report Status PENDING  Incomplete     Scheduled Meds: . fluconazole  200 mg Oral Daily  . heparin  5,000 Units Subcutaneous Q8H  . pantoprazole  40 mg Oral Daily  . piperacillin-tazobactam (ZOSYN)  IV  3.375 g Intravenous Q8H  . sodium chloride  500 mL Intravenous Once  . vancomycin  1,250 mg Intravenous Q12H   Continuous Infusions: . 0.9 % NaCl with KCl 40 mEq / L 75 mL/hr at 02/17/16 1800     LOS: 4 days     Office  [phone] Pager - Text Page per Loretha Stapler as per below:  On-Call/Text Page:      [email or web address]      password [email or web address]  If 7PM-7AM,  please contact night-coverage [email or web address] Password [email or web address] 02/18/2016, 8:20 AM

## 2016-02-18 NOTE — Progress Notes (Signed)
Patient ID: Brandy Hardy, female   DOB: 05-25-1955, 60 y.o.   MRN: [id number]  [facility] Surgery Progress Note  2 Days Post-Op  Subjective: Tolerating dressing changes well.   Objective: Vital signs in last 24 hours: Temp:  [98.3 F (36.8 C)-99.1 F (37.3 C)] 98.3 F (36.8 C) (11/13 0511) Pulse Rate:  [74-86] 86 (11/13 0511) Resp:  [18-21] 21 (11/13 0511) BP: (102-106)/(56-59) 106/56 (11/13 0511) SpO2:  [94 %-96 %] 94 % (11/13 0511) Weight:  [294 lb 6.4 oz (133.5 kg)] 294 lb 6.4 oz (133.5 kg) (11/13 0511) Last BM Date: 02/17/16  Intake/Output from previous day: 11/12 0701 - 11/13 0700 In: 2265 [P.O.:840; I.V.:825; IV Piggyback:600] Out: -  Intake/Output this shift: No intake/output data recorded.  PE: Gen:  Alert, NAD, pleasant Pulm:  Effort normal Abd: obese, Soft, NT/ND Skin: wounds (pictured below) stable. packing removed from mons pubis wound with some purulence and foul odor, penrose drain in place. Smaller right thigh wound also with some purulent drainage as well, penrose drain in place. Mild surrounding erythema.       Lab Results:   Recent Labs  02/16/16 0510 02/17/16 0500  WBC 15.4* 16.4*  HGB 9.0* 9.6*  HCT 27.6* 28.9*  PLT 266 243   BMET  Recent Labs  02/16/16 0510 02/17/16 0500  NA 139 138  K 3.2* 3.2*  CL 104 103  CO2 27 28  GLUCOSE 108* 108*  BUN 16 7  CREATININE 0.80 0.60  CALCIUM 7.1* 7.2*   PT/INR No results for input(s): LABPROT, INR in the last 72 hours. CMP     Component Value Date/Time   NA 138 02/17/2016 0500   K 3.2 (L) 02/17/2016 0500   CL 103 02/17/2016 0500   CO2 28 02/17/2016 0500   GLUCOSE 108 (H) 02/17/2016 0500   BUN 7 02/17/2016 0500   CREATININE 0.60 02/17/2016 0500   CALCIUM 7.2 (L) 02/17/2016 0500   PROT 4.3 (L) 02/17/2016 0500   ALBUMIN 1.1 (L) 02/17/2016 0500   AST 59 (H) 02/17/2016 0500   ALT 43 02/17/2016 0500   ALKPHOS 93 02/17/2016 0500   BILITOT 1.0 02/17/2016 0500   GFRNONAA >60  02/17/2016 0500   GFRAA >60 02/17/2016 0500   Lipase  No results found for: LIPASE     Studies/Results: No results found.  Anti-infectives: Anti-infectives    Start     Dose/Rate Route Frequency Ordered Stop   02/18/16 0800  fluconazole (DIFLUCAN) tablet 200 mg     200 mg Oral Daily 02/17/16 1159     02/16/16 1800  vancomycin (VANCOCIN) 1,250 mg in sodium chloride 0.9 % 250 mL IVPB     1,250 mg 166.7 mL/hr over 90 Minutes Intravenous Every 12 hours 02/16/16 1624     02/14/16 1000  vancomycin (VANCOCIN) IVPB 750 mg/150 ml premix  Status:  Discontinued     750 mg 150 mL/hr over 60 Minutes Intravenous Every 12 hours 02/14/16 0351 02/15/16 1057   02/14/16 1000  piperacillin-tazobactam (ZOSYN) IVPB 3.375 g     3.375 g 12.5 mL/hr over 240 Minutes Intravenous Every 8 hours 02/14/16 0351     02/14/16 0600  fluconazole (DIFLUCAN) IVPB 200 mg  Status:  Discontinued     200 mg 100 mL/hr over 60 Minutes Intravenous Every 24 hours 02/14/16 0534 02/17/16 1159   02/14/16 0300  piperacillin-tazobactam (ZOSYN) IVPB 3.375 g     3.375 g 100 mL/hr over 30 Minutes Intravenous  Once 02/14/16 0246 02/14/16 0313  02/14/16 0300  vancomycin (VANCOCIN) IVPB 1000 mg/200 mL premix     1,000 mg 200 mL/hr over 60 Minutes Intravenous  Once 02/14/16 0246 02/14/16 0404   02/14/16 0245  cefTRIAXone (ROCEPHIN) 2 g in dextrose 5 % 50 mL IVPB  Status:  Discontinued     2 g 100 mL/hr over 30 Minutes Intravenous  Once 02/14/16 0231 02/14/16 0245       Assessment/Plan S/p incision and drainage of right thigh abscess, full thickness debridement of 40cm^2 right groin for necrotizing fasciitis 11/11 Dr. Sheliah Hatch - POD 2 - gram stain ABUNDANT WBC PRESENT, PREDOMINANTLY PMN RARE GRAM POSITIVE COCCI IN PAIRS RARE GRAM NEGATIVE RODS; culture pending - persistent low grade fevers - WBC pending  ID - zosyn 11/9>>, vanc 11/9>>, diflucan 11/9>> FEN - heart healthy VTE - heparin  Plan - does not appear to need any  further OR debridement at this time. BID wet to dry packing/dressing changes. Continue antibiotics and will follow cultures. CBC/BMP pending today. May consider switching to wound vac later this week.   LOS: 4 days    Edson Snowball , [facility] Surgery 02/18/2016, 9:20 AM Pager: [phone] Consults: [phone] Mon-Fri 7:00 am-4:30 pm Sat-Sun 7:00 am-11:30 am

## 2016-02-18 NOTE — Progress Notes (Signed)
Report received via [place] in patient's room using SBAR format, reviewed labs, VS, meds and patient's general condition, assumed care of patient.

## 2016-02-18 NOTE — Progress Notes (Addendum)
Initial Nutrition Assessment  DOCUMENTATION CODES:   Morbid obesity  INTERVENTION:    Ensure Enlive po TID, each supplement provides 350 kcal and 20 grams of protein  MVI daily  Encourage good intake of meals and supplements  NUTRITION DIAGNOSIS:   Increased nutrient needs related to wound healing as evidenced by estimated needs.  GOAL:   Patient will meet greater than or equal to 90% of their needs  MONITOR:   PO intake, Supplement acceptance, Labs, Skin  REASON FOR ASSESSMENT:   Consult Wound healing  ASSESSMENT:   60 y.o. female who presented to ED for weakness and s/p fall without LOC or head trauma. In the ED, patient was found to be febrile, tachycardic, and hypotensive. Found to have right groin necrotizing fasciitis, right thigh abscess. S/P I&D on 11/11.  Patient reports poor appetite and poor intake for the past 2 weeks. Since admission, she has been consuming </= 25% of meals and has had no appetite. Discussed increased protein needs and good dietary sources of protein. Encouraged patient to drink PO supplements TID. She has been taking Ensure supplements when intake of her meal is poor.  Nutrition focused physical exam completed.  No muscle or subcutaneous fat depletion noticed. Labs and medications reviewed.  Diet Order:  Diet Heart Room service appropriate? Yes; Fluid consistency: Thin  Skin:  Wound (see comment) (Necrotizing fasciitis to groin S/P I&D)  Last BM:  11/13  Height:   Ht Readings from Last 1 Encounters:  02/15/16 5\' 3"  (1.6 m)    Weight:   Wt Readings from Last 1 Encounters:  02/18/16 294 lb 6.4 oz (133.5 kg)    Ideal Body Weight:  52.3 kg  BMI:  Body mass index is 52.15 kg/m.  Estimated Nutritional Needs:   Kcal:  [id number]  Protein:  135-155 gm  Fluid:  >/= 2.3 L  EDUCATION NEEDS:   Education needs addressed  Molli Barrows, [place], [place], [place] Pager [phone] After Hours Pager [phone]

## 2016-02-19 ENCOUNTER — Other Ambulatory Visit: Payer: Self-pay | Admitting: Endocrinology

## 2016-02-19 DIAGNOSIS — Z8541 Personal history of malignant neoplasm of cervix uteri: Secondary | ICD-10-CM

## 2016-02-19 DIAGNOSIS — D638 Anemia in other chronic diseases classified elsewhere: Secondary | ICD-10-CM

## 2016-02-19 DIAGNOSIS — R509 Fever, unspecified: Secondary | ICD-10-CM

## 2016-02-19 DIAGNOSIS — R5081 Fever presenting with conditions classified elsewhere: Secondary | ICD-10-CM

## 2016-02-19 DIAGNOSIS — R0682 Tachypnea, not elsewhere classified: Secondary | ICD-10-CM

## 2016-02-19 DIAGNOSIS — E669 Obesity, unspecified: Secondary | ICD-10-CM

## 2016-02-19 DIAGNOSIS — L02214 Cutaneous abscess of groin: Secondary | ICD-10-CM

## 2016-02-19 DIAGNOSIS — D72820 Lymphocytosis (symptomatic): Secondary | ICD-10-CM

## 2016-02-19 DIAGNOSIS — D62 Acute posthemorrhagic anemia: Secondary | ICD-10-CM

## 2016-02-19 LAB — BASIC METABOLIC PANEL
Anion gap: 7 (ref 5–15)
BUN: 7 mg/dL (ref 6–20)
CHLORIDE: 104 mmol/L (ref 101–111)
CO2: 27 mmol/L (ref 22–32)
Calcium: 7.3 mg/dL — ABNORMAL LOW (ref 8.9–10.3)
Creatinine, Ser: 0.62 mg/dL (ref 0.44–1.00)
GFR calc non Af Amer: >60 mL/min (ref >60)
Glucose, Bld: 103 mg/dL — ABNORMAL HIGH (ref 65–99)
POTASSIUM: 4.4 mmol/L (ref 3.5–5.1)
SODIUM: 138 mmol/L (ref 135–145)

## 2016-02-19 LAB — CBC
HEMATOCRIT: 26.4 % — AB (ref 36.0–46.0)
Hemoglobin: 8.5 g/dL — ABNORMAL LOW (ref 12.0–15.0)
MCH: 30.4 pg (ref 26.0–34.0)
MCHC: 32.2 g/dL (ref 30.0–36.0)
MCV: 94.3 fL (ref 78.0–100.0)
Platelets: 280 10*3/uL (ref 150–400)
RBC: 2.8 MIL/uL — AB (ref 3.87–5.11)
RDW: 15.5 % (ref 11.5–15.5)
WBC: 13.4 10*3/uL — AB (ref 4.0–10.5)

## 2016-02-19 NOTE — Progress Notes (Signed)
[facility] TEAM 1 - Stepdown/ICU TEAM  Dimitri Hinderman  ZOX:[id number] DOB: [birth year] DOA: 02/14/2016 PCP: Lupe Carney, MD    Brief Narrative:  60 y.o. female who presented to ED for weakness s/p fall without LOC or head trauma. Was experiencing dysuria, suprapubic pain, and urinary frequency for 5 days. Was evaluated at Urgent Care facility and diagnosed with UTI. Was given Rx for Norco, Nitrofurantoin, and Azo. She had taken 3 days of antibiotics. A urine culture was sent but patient is unaware of the results. After starting these medications, dysuria improved but her other symptoms did not.   In the ED, patient was found to be febrile, tachycardic, and hypotensive. Labs were significant for K 2.4, leukocytosis of 20.9, and lactic acid of 4.7. UA appeared consistent with an infectious process.  Code sepsis was called. Patient was given NS 4L bolus without improvement in blood pressure. Therefore, Levophed was initiated. Broad spectrum antibiotics were initiated.  Status post incision and drainage of right thigh abscess, full thickness debridement of 40cm^2 right groin for necrotizing fasciitis 11/11.    Significant Events: 11/9 Admit for septic shock 11/10 [facility] assumed care    Subjective:  Hemodynamically stable, concerned about low grade fever   Assessment & Plan: Septic Shock due to necrotizing fasciitis levophed now off - urine culture <10,000 colonies - continue broad-spectrum empiric coverage Follow culture from incision and drainage on 11/11  mons pubis and right thigh necrotizing fasciitis Continue broad-spectrum antibiotics -added vancomycin to Zosyn and fluconazole on 11/11. Gen. surgery consulted.  Status post incision and drainage of right thigh abscess, full thickness debridement of 40cm^2 right groin for necrotizing fasciitis 11/11. Gram stain shows abundant gram-positive cocci in pairs and gram-negative rods. Appreciate surgery input.  Continue twice a day daily packing  changes. Consulted wound care nurse for recommendations. At this point does not appear to need any further debridement.   AKI improving Due to above (bl Cr 0.6) - 1.32>0.8>0.6  Severe protein calorie malnutrition-albumin 1.1, nutrition therapy following  Mildly elevated troponin  Follow to [facility] - no sx at present to suggest USAP/denies CP 2-D echo  showed EF of 65-70%, wall motion was normal, no regional wall motion abnormalities,  Hypokalemia  Replace and follow - Mg is normal   Candidal intertrigo -patient on fluconazole   Morbid obesity Body mass index is 53.25 kg/m.   DVT prophylaxis: SQ heparin  Code Status: FULL CODE Family Communication: no family present at time of exam  Disposition Plan:  Final disposition will be per general surgery, inpatient rehab ?  Consultants:  PCCM General surgery   Antimicrobials:  Vancomycin 11/8 > 11/10, 11/11> Zosyn 11/8 >  Objective: Blood pressure 117/61, pulse 86, temperature 98.4 F (36.9 C), temperature source Oral, resp. rate 20, height 5\' 3"  (1.6 m), weight (!) 136.4 kg (300 lb 9.6 oz), SpO2 94 %.  Intake/Output Summary (Last 24 hours) at 02/19/16 0828 Last data filed at 02/19/16 0530  Gross per 24 hour  Intake             3995 ml  Output              150 ml  Net             3845 ml   Filed Weights   02/17/16 0518 02/18/16 0511 02/19/16 0500  Weight: 130.7 kg (288 lb 1.6 oz) 133.5 kg (294 lb 6.4 oz) (!) 136.4 kg (300 lb 9.6 oz)    Examination: General: No acute  respiratory distress Lungs: Clear to auscultation bilaterally without wheezes or crackles Cardiovascular: Regular rate and rhythm without murmur gallop or rub normal S1 and S2 Abdomen: Nontender, nondistended, soft, bowel sounds positive, no rebound, no ascites, no appreciable mass - mild erythema of panus in suprapubic region w/o fluctuance or induration or d/c  Extremities: No significant cyanosis, clubbing, or edema bilateral lower  extremities  CBC:  Recent Labs Lab 02/14/16 0228  02/15/16 0440 02/16/16 0510 02/17/16 0500 02/18/16 1109 02/19/16 0410  WBC 20.9*  < > 16.1* 15.4* 16.4* 15.8* 13.4*  NEUTROABS 19.9*  --   --   --   --   --   --   HGB 11.5*  < > 10.3* 9.0* 9.6* 8.7* 8.5*  HCT 34.5*  < > 30.6* 27.6* 28.9* 27.3* 26.4*  MCV 92.5  < > 90.8 90.2 91.5 93.5 94.3  PLT 287  < > 270 266 243 283 280  < > = values in this interval not displayed. Basic Metabolic Panel:  Recent Labs Lab 02/14/16 0339 02/14/16 1115 02/15/16 0440 02/16/16 0510 02/17/16 0500 02/18/16 1109 02/19/16 0410  NA  --  133* 134* 139 138 138 138  K  --  2.9* 2.9* 3.2* 3.2* 3.7 4.4  CL  --  98* 100* 104 103 103 104  CO2  --  26 25 27 28 29 27   GLUCOSE  --  166* 120* 108* 108* 123* 103*  BUN  --  15 22* 16 7 7 7   CREATININE  --  0.84 1.32* 0.80 0.60 0.59 0.62  CALCIUM  --  6.9* 6.8* 7.1* 7.2* 7.1* 7.3*  MG 1.9 1.7 1.9  --   --  1.8  --   PHOS  --  1.6* 2.1*  --   --   --   --    GFR: Estimated Creatinine Clearance: 101.5 mL/min (by C-G formula based on SCr of 0.62 mg/dL).  Liver Function Tests:  Recent Labs Lab 02/14/16 0228 02/17/16 0500 02/18/16 1109  AST 83* 59* 43*  ALT 46 43 33  ALKPHOS 118 93 86  BILITOT 0.9 1.0 0.6  PROT 5.7* 4.3* 4.0*  ALBUMIN 1.6* 1.1* 1.0*   Cardiac Enzymes:  Recent Labs Lab 02/14/16 0636 02/14/16 1115 02/14/16 1715 02/15/16 1445 02/16/16 0510  TROPONINI 0.05* 0.03* 0.11* 0.70* 0.05*    HbA1C: Hgb A1c MFr Bld  Date/Time Value Ref Range Status  10/08/2015 10:36 AM 5.6 4.6 - 6.5 % Final    Comment:    Glycemic Control Guidelines for People with Diabetes:Non Diabetic:  <6%Goal of Therapy: <7%Additional Action Suggested:  >8%   02/27/2015 08:19 AM 5.7 4.6 - 6.5 % Final    Comment:    Glycemic Control Guidelines for People with Diabetes:Non Diabetic:  <6%Goal of Therapy: <7%Additional Action Suggested:  >8%     Recent Results (from the past 240 hour(s))  Urine culture      Status: Abnormal   Collection Time: 02/14/16  2:28 AM  Result Value Ref Range Status   Specimen Description URINE, RANDOM  Final   Special Requests NONE  Final   Culture <10,000 COLONIES/mL INSIGNIFICANT GROWTH (A)  Final   Report Status 02/15/2016 FINAL  Final  Blood Culture (routine x 2)     Status: None (Preliminary result)   Collection Time: 02/14/16  2:40 AM  Result Value Ref Range Status   Specimen Description BLOOD LEFT ARM  Final   Special Requests BOTTLES DRAWN AEROBIC AND ANAEROBIC  Final  Culture  Setup Time   Final    GRAM NEGATIVE RODS ANAEROBIC BOTTLE ONLY CRITICAL RESULT CALLED TO, READ BACK BY AND VERIFIED WITH: Hollie Beach AT 1610 02/17/16 BY L BENFIELD    Culture   Final    GRAM NEGATIVE RODS CULTURE REINCUBATED FOR BETTER GROWTH    Report Status PENDING  Incomplete  Blood Culture ID Panel (Reflexed)     Status: None   Collection Time: 02/14/16  2:40 AM  Result Value Ref Range Status   Enterococcus species NOT DETECTED NOT DETECTED Final   Listeria monocytogenes NOT DETECTED NOT DETECTED Final   Staphylococcus species NOT DETECTED NOT DETECTED Final   Staphylococcus aureus NOT DETECTED NOT DETECTED Final   Streptococcus species NOT DETECTED NOT DETECTED Final   Streptococcus agalactiae NOT DETECTED NOT DETECTED Final   Streptococcus pneumoniae NOT DETECTED NOT DETECTED Final   Streptococcus pyogenes NOT DETECTED NOT DETECTED Final   Acinetobacter baumannii NOT DETECTED NOT DETECTED Final   Enterobacteriaceae species NOT DETECTED NOT DETECTED Final   Enterobacter cloacae complex NOT DETECTED NOT DETECTED Final   Escherichia coli NOT DETECTED NOT DETECTED Final   Klebsiella oxytoca NOT DETECTED NOT DETECTED Final   Klebsiella pneumoniae NOT DETECTED NOT DETECTED Final   Proteus species NOT DETECTED NOT DETECTED Final   Serratia marcescens NOT DETECTED NOT DETECTED Final   Haemophilus influenzae NOT DETECTED NOT DETECTED Final   Neisseria meningitidis  NOT DETECTED NOT DETECTED Final   Pseudomonas aeruginosa NOT DETECTED NOT DETECTED Final   Candida albicans NOT DETECTED NOT DETECTED Final   Candida glabrata NOT DETECTED NOT DETECTED Final   Candida krusei NOT DETECTED NOT DETECTED Final   Candida parapsilosis NOT DETECTED NOT DETECTED Final   Candida tropicalis NOT DETECTED NOT DETECTED Final  MRSA PCR Screening     Status: None   Collection Time: 02/14/16  6:04 AM  Result Value Ref Range Status   MRSA by PCR NEGATIVE NEGATIVE Final    Comment:        The GeneXpert MRSA Assay (FDA approved for NASAL specimens only), is one component of a comprehensive MRSA colonization surveillance program. It is not intended to diagnose MRSA infection nor to guide or monitor treatment for MRSA infections.   Surgical pcr screen     Status: None   Collection Time: 02/16/16  5:00 PM  Result Value Ref Range Status   MRSA, PCR NEGATIVE NEGATIVE Final   Staphylococcus aureus NEGATIVE NEGATIVE Final    Comment:        The Xpert SA Assay (FDA approved for NASAL specimens in patients over 93 years of age), is one component of a comprehensive surveillance program.  Test performance has been validated by [facility] for patients greater than or equal to 10 year old. It is not intended to diagnose infection nor to guide or monitor treatment.   Aerobic/Anaerobic Culture (surgical/deep wound)     Status: None (Preliminary result)   Collection Time: 02/16/16  7:52 PM  Result Value Ref Range Status   Specimen Description ABSCESS GROIN  Final   Special Requests NONE  Final   Gram Stain   Final    ABUNDANT WBC PRESENT, PREDOMINANTLY PMN RARE GRAM POSITIVE COCCI IN PAIRS RARE GRAM NEGATIVE RODS    Culture HOLDING FOR POSSIBLE ANAEROBE  Final   Report Status PENDING  Incomplete     Scheduled Meds: . feeding supplement (ENSURE ENLIVE)  237 mL Oral TID WC  . fluconazole  200 mg Oral Daily  .  heparin  5,000 Units Subcutaneous Q8H  .  multivitamin with minerals  1 tablet Oral Daily  . pantoprazole  40 mg Oral Daily  . piperacillin-tazobactam (ZOSYN)  IV  3.375 g Intravenous Q8H  . sodium chloride  500 mL Intravenous Once  . vancomycin  1,250 mg Intravenous Q12H   Continuous Infusions: . 0.9 % NaCl with KCl 40 mEq / L 125 mL/hr (02/18/16 2250)     LOS: 5 days     Office  [phone] Pager - Text Page per Loretha Stapler as per below:  On-Call/Text Page:      [email or web address]      password [email or web address]  If 7PM-7AM, please contact night-coverage [email or web address] Password [email or web address] 02/19/2016, 8:28 AM

## 2016-02-19 NOTE — H&P (Signed)
Physical Medicine and Rehabilitation Consult Reason for Consult: Debilitation related to sepsis/multi-medical Referring Physician: [facility]   HPI: Brandy Hardy is a 60 y.o. right hand female with history of cervical cancer with chemotherapy and radiation. Patient lives with spouse independent and recently started using a walker prior to admission. One level home with 4 steps to entry. Spouse to take FMLA to assist. Presented 02/14/2016 with intermittent fevers, generalized weakness, falls, dysuria with suprapubic pain as well as urinary frequency. Patient found to be hypotensive. Hypokalemia 2.4, leukocytosis 20,900 and lactic acid 4.7. Chest x-ray showed no focal airspace consolidation or pulmonary edema. She was given normal saline 4 liter bolus for hypotension as well as receiving Levophed. Findings of a right groin abscess with drainage. Cultures growing gram-positive cocci in pairs and rare gram-negative rods. Placed on broad-spectrum antibiotics for suspected sepsis. General surgery consulted underwent incision and drainage of right thigh abscess full-thickness debridement for necrotizing fasciitis 02/16/2016. Dressing changes as advised. Subcutaneous heparin for DVT prophylaxis. Acute on chronic anemia 8.5 and monitor. Physical therapy evaluation completed 02/19/2016 with recommendations of physical medicine rehabilitation consult.   Review of Systems  HENT: Negative for hearing loss and tinnitus.   Eyes: Negative for blurred vision and double vision.  Respiratory: Negative for cough and shortness of breath.   Cardiovascular: Negative for chest pain, palpitations and leg swelling.  Gastrointestinal: Positive for constipation. Negative for abdominal pain and vomiting.  Genitourinary: Positive for dysuria and flank pain. Negative for hematuria.  Musculoskeletal: Positive for myalgias.       Patient with recent fall  Skin: Negative for rash.  Neurological: Positive for weakness.  Negative for seizures and loss of consciousness.  All other systems reviewed and are negative.  Past Medical History:  Diagnosis Date  . Cancer [facility])    cervical   Past Surgical History:  Procedure Laterality Date  . ABDOMINAL HYSTERECTOMY    . IRRIGATION AND DEBRIDEMENT ABSCESS Right 02/16/2016   Procedure: IRRIGATION AND DEBRIDEMENT RIGHT GROIN ABSCESS, FULL THICKNESS DEBRIDMENT RIGHT GROIN INCLUDING > 40CM SQUARE.;  Surgeon: Arta Bruce Kinsinger, MD;  Location: [facility];  Service: General;  Laterality: Right;   Family History  Problem Relation Age of Onset  . Hypertension Mother   . Heart disease Father 65    Expired  . Diabetes Neg Hx    Social History:  reports that she has never smoked. She has never used smokeless tobacco. She reports that she does not drink alcohol or use drugs. Allergies: No Known Allergies Medications Prior to Admission  Medication Sig Dispense Refill  . celecoxib (CELEBREX) 200 MG capsule Take 200 mg by mouth 2 (two) times daily.     Marland Kitchen HYDROcodone-acetaminophen (NORCO/VICODIN) 5-325 MG tablet Take 1 tablet by mouth every 4 (four) hours as needed for moderate pain.    . nitrofurantoin (MACRODANTIN) 100 MG capsule Take 100 mg by mouth 2 (two) times daily.    . Probiotic Product (PROBIOTIC DAILY PO) Take 1 tablet by mouth daily.       Home: Home Living Family/patient expects to be discharged to:: Private residence Living Arrangements: Spouse/significant other Available Help at Discharge: Family, Available 24 hours/day (Spouse took FMLA) Type of Home: House Home Access: Stairs to enter Technical brewer of Steps: 4-5 Entrance Stairs-Rails: Right Home Layout: One level Home Equipment: Walker - 2 wheels  Functional History: Prior Function Level of Independence: Independent with assistive device(s) Comments: Using RW for ambulation in the last few days PTA. Cooks, cleans.  Functional Status:  Mobility: Bed Mobility Overal bed mobility: Needs  Assistance Bed Mobility: Rolling, Sidelying to Sit Rolling: Mod assist Sidelying to sit: Max assist, HOB elevated General bed mobility comments: Step by step cues for technique, heavy use of rail, cues for log roll. Assist with RLE, and to elevate trunk to sitting. Transfers Overall transfer level: Needs assistance Equipment used: Rolling walker (2 wheeled) Transfers: Sit to/from Stand, [employer] Transfers Sit to Stand: Mod assist, +2 physical assistance Stand pivot transfers: Mod assist, +2 safety/equipment General transfer comment: Assist of 2 to boost to standing with cues for technique and hand placement. Wide BoS and difficulty getting LEs within RW. Stood from EOB and from [facility] SPT bed to [facility] and [facility] to chair.      ADL:    Cognition: Cognition Overall Cognitive Status: Within Functional Limits for tasks assessed Orientation Level: Oriented X4 Cognition Arousal/Alertness: Awake/alert Behavior During Therapy: WFL for tasks assessed/performed Overall Cognitive Status: Within Functional Limits for tasks assessed Memory: Decreased short-term memory (pt reported, "my mind isnt what it used to be.")  Blood pressure 117/61, pulse 86, temperature 98.4 F (36.9 C), temperature source Oral, resp. rate 20, height 5\' 3"  (1.6 m), weight (!) 136.4 kg (300 lb 9.6 oz), SpO2 94 %. Physical Exam  Vitals reviewed. Constitutional: She is oriented to person, place, and time. She appears well-developed.  Obese  HENT:  Head: Normocephalic and atraumatic.  Eyes: Conjunctivae and EOM are normal.  Neck: Normal range of motion. Neck supple. No thyromegaly present.  Cardiovascular: Normal rate and regular rhythm.   Respiratory: Effort normal and breath sounds normal. No respiratory distress.  GI: Soft. Bowel sounds are normal. She exhibits no distension.  Musculoskeletal:  Edema and tenderness in RLE  Neurological: She is alert and oriented to person, place, and time.  Sensation intact to  light touch Motor: B/le UE 4+/5 proximal to distal B/l LE: HF 2/5, KE 4-/5, ADF/PF 4+/5  Skin: Skin is warm and dry.  Groin abscess is dressed  Psychiatric: She has a normal mood and affect. Her behavior is normal.    Results for orders placed or performed during the hospital encounter of 02/14/16 (from the past 24 hour(s))  CBC     Status: Abnormal   Collection Time: 02/18/16 11:09 AM  Result Value Ref Range   WBC 15.8 (H) 4.0 - 10.5 K/uL   RBC 2.92 (L) 3.87 - 5.11 MIL/uL   Hemoglobin 8.7 (L) 12.0 - 15.0 g/dL   HCT 27.3 (L) 36.0 - 46.0 %   MCV 93.5 78.0 - 100.0 fL   MCH 29.8 26.0 - 34.0 pg   MCHC 31.9 30.0 - 36.0 g/dL   RDW 15.0 11.5 - 15.5 %   Platelets 283 150 - 400 K/uL  Comprehensive metabolic panel     Status: Abnormal   Collection Time: 02/18/16 11:09 AM  Result Value Ref Range   Sodium 138 135 - 145 mmol/L   Potassium 3.7 3.5 - 5.1 mmol/L   Chloride 103 101 - 111 mmol/L   CO2 29 22 - 32 mmol/L   Glucose, Bld 123 (H) 65 - 99 mg/dL   BUN 7 6 - 20 mg/dL   Creatinine, Ser 0.59 0.44 - 1.00 mg/dL   Calcium 7.1 (L) 8.9 - 10.3 mg/dL   Total Protein 4.0 (L) 6.5 - 8.1 g/dL   Albumin 1.0 (L) 3.5 - 5.0 g/dL   AST 43 (H) 15 - 41 U/L   ALT 33 14 - 54  U/L   Alkaline Phosphatase 86 38 - 126 U/L   Total Bilirubin 0.6 0.3 - 1.2 mg/dL   GFR calc non Af Amer >60 >60 mL/min   GFR calc Af Amer >60 >60 mL/min   Anion gap 6 5 - 15  Magnesium     Status: None   Collection Time: 02/18/16 11:09 AM  Result Value Ref Range   Magnesium 1.8 1.7 - 2.4 mg/dL  Basic metabolic panel     Status: Abnormal   Collection Time: 02/19/16  4:10 AM  Result Value Ref Range   Sodium 138 135 - 145 mmol/L   Potassium 4.4 3.5 - 5.1 mmol/L   Chloride 104 101 - 111 mmol/L   CO2 27 22 - 32 mmol/L   Glucose, Bld 103 (H) 65 - 99 mg/dL   BUN 7 6 - 20 mg/dL   Creatinine, Ser 0.62 0.44 - 1.00 mg/dL   Calcium 7.3 (L) 8.9 - 10.3 mg/dL   GFR calc non Af Amer >60 >60 mL/min   GFR calc Af Amer >60 >60 mL/min    Anion gap 7 5 - 15  CBC     Status: Abnormal   Collection Time: 02/19/16  4:10 AM  Result Value Ref Range   WBC 13.4 (H) 4.0 - 10.5 K/uL   RBC 2.80 (L) 3.87 - 5.11 MIL/uL   Hemoglobin 8.5 (L) 12.0 - 15.0 g/dL   HCT 26.4 (L) 36.0 - 46.0 %   MCV 94.3 78.0 - 100.0 fL   MCH 30.4 26.0 - 34.0 pg   MCHC 32.2 30.0 - 36.0 g/dL   RDW 15.5 11.5 - 15.5 %   Platelets 280 150 - 400 K/uL   No results found.  Assessment/Plan: Diagnosis: Debilitation  Labs and images independently reviewed.  Records reviewed and summated above.  1. Does the need for close, 24 hr/day medical supervision in concert with the patient's rehab needs make it unreasonable for this patient to be served in a less intensive setting? Yes  2. Co-Morbidities requiring supervision/potential complications: cervical cancer with chemotherapy and radiation, Acute on chronic anemia (transfuse if necessary to ensure appropriate perfusion for increased activity tolerance), tachypnea (monitor RR and O2 Sats with increased physical exertion), leukocytosis (cont to monitor for signs and symptoms of infection, further workup if indicated, trending down), Right groin abscess (narrow abx- currently Vanc/Zosyn, as cultures finalize-monitor Vanc levels for time being), super obese (Body mass index is 53.25 kg/m., Diet and exercise education, encourage weight loss to increase endurance and promote overall health) 3. Due to safety, skin/wound care, disease management, pain management and patient education, does the patient require 24 hr/day rehab nursing? Yes 4. Does the patient require coordinated care of a physician, rehab nurse, PT (1-2 hrs/day, 5 days/week) and OT (1-2 hrs/day, 5 days/week) to address physical and functional deficits in the context of the above medical diagnosis(es)? Yes Addressing deficits in the following areas: balance, endurance, locomotion, strength, transferring, bathing, dressing, toileting and psychosocial support 5. Can the  patient actively participate in an intensive therapy program of at least 3 hrs of therapy per day at least 5 days per week? Yes 6. The potential for patient to make measurable gains while on inpatient rehab is excellent 7. Anticipated functional outcomes upon discharge from inpatient rehab are supervision and min assist  with PT, supervision and min assist with OT, n/a with SLP. 8. Estimated rehab length of stay to reach the above functional goals is: 14-17 days. 9. Does the patient have  adequate social supports and living environment to accommodate these discharge functional goals? Yes 10. Anticipated D/C setting: Home 11. Anticipated post D/C treatments: HH therapy and Home excercise program 12. Overall Rehab/Functional Prognosis: excellent  RECOMMENDATIONS: This patient's condition is appropriate for continued rehabilitative care in the following setting: CIR Patient has agreed to participate in recommended program. Yes Note that insurance prior authorization may be required for reimbursement for recommended care.  Comment: Rehab Admissions Coordinator to follow up.  Delice Lesch, MD, Mellody Drown 02/19/2016

## 2016-02-19 NOTE — Consult Note (Signed)
[place] Nurse wound consult note Reason for Consult: surgical debridement site, asked to evaluate with surgery team for other ways to manage wounds Wound type: surgical  Measurement: see operative notes, unable to measure today Wound bed: see photo Drainage (amount, consistency, odor) moderate, no odor Periwound:moist Dressing procedure/placement/frequency:   I do not feel with the current location and use of Penrose in the most proximal wound that we could be successful with a NPWT dressing, the distal 2 wounds are connected with Penrose through them, would not even consider this due to the location.  Spoke with PAs from [facility] and explained that success with VAC dressing in this area would be very difficult, area is very moist, has pubic hair and when the patient is up to the bathroom the dressing would not stay sealed. Suggested HHRN for teaching the husband wound care, and consider 1/2" packing strip for the two distal tunneled areas when Penrose is removed.   [facility] will continue to direct wound care orders.   Discussed POC with patient and bedside nurse.  Re consult if needed, will not follow at this time. Thanks  Brandy Hardy [employer], RN,CWOCN, CNS [phone])

## 2016-02-19 NOTE — Evaluation (Signed)
Physical Therapy Evaluation Patient Details Name: Brandy Hardy MRN: [id number] DOB: 28-Aug-1955 Today's Date: 02/19/2016   History of Present Illness  Patient is a 60 y/o female with hx of cervical cancer presents from home with weakness, falls, dysuria, suprapubic pain, and urinary frequency for past 5 days. Found to be febrile, tachycardic, and hypotensive secondary to sepsis from UTI.  Noted to have right groin abscess s/p I&D for necrotizing fasciitis.   Clinical Impression  Patient presents with pain, generalized weakness, deconditioning and impaired balance/mobility s/p above. Tolerated standing and SPT to chair and BSC with Mod A of 2 for safety. Pt with difficulty standing due to having short stature and legs not able to touch floor. Pt reporting more anticipatory pain with regards to mobility. Eager to return to PLOF. Pt Mod I with RW PTA. Would benefit from CIR to maximize independence and mobility prior to return home. Will follow acutely.    Follow Up Recommendations CIR    Equipment Recommendations  Other (comment) (TBA)    Recommendations for Other Services OT consult     Precautions / Restrictions Precautions Precautions: Fall Restrictions Weight Bearing Restrictions: No      Mobility  Bed Mobility Overal bed mobility: Needs Assistance Bed Mobility: Rolling;Sidelying to Sit Rolling: Mod assist Sidelying to sit: Max assist;HOB elevated       General bed mobility comments: Step by step cues for technique, heavy use of rail, cues for log roll. Assist with RLE, and to elevate trunk to sitting.  Transfers Overall transfer level: Needs assistance Equipment used: Rolling walker (2 wheeled) Transfers: Sit to/from [employer] Sit to Stand: Mod assist;+2 physical assistance Stand pivot transfers: Mod assist;+2 safety/equipment       General transfer comment: Assist of 2 to boost to standing with cues for technique and hand placement. Wide BoS and  difficulty getting LEs within RW. Stood from EOB and from [facility]. SPT bed to [facility] and BSC to chair.  Ambulation/Gait                Stairs            Wheelchair Mobility    Modified Rankin (Stroke Patients Only)       Balance Overall balance assessment: Needs assistance Sitting-balance support: Feet supported;Bilateral upper extremity supported Sitting balance-Leahy Scale: Fair Sitting balance - Comments: Requires BUE support sitting EOB due to not able to place feet on floor. Postural control: Posterior lean Standing balance support: During functional activity Standing balance-Leahy Scale: Poor Standing balance comment: Reliant on BUEs for support ins tanding.                             Pertinent Vitals/Pain Pain Assessment: Faces Faces Pain Scale: Hurts even more Pain Location: RLE and incision Pain Descriptors / Indicators: Grimacing;Guarding;Sore;Operative site guarding Pain Intervention(s): Monitored during session;Repositioned;Limited activity within patient's tolerance    Home Living Family/patient expects to be discharged to:: Private residence Living Arrangements: Spouse/significant other Available Help at Discharge: Family;Available 24 hours/day (Spouse took FMLA) Type of Home: House Home Access: Stairs to enter Entrance Stairs-Rails: Right Entrance Stairs-Number of Steps: 4-5 Home Layout: One level Home Equipment: Walker - 2 wheels      Prior Function Level of Independence: Independent with assistive device(s)         Comments: Using RW for ambulation in the last few days PTA. Cooks, cleans.      Hand Dominance  Extremity/Trunk Assessment   Upper Extremity Assessment: Defer to OT evaluation           Lower Extremity Assessment: Generalized weakness      Cervical / Trunk Assessment: Normal  Communication   Communication: No difficulties  Cognition Arousal/Alertness: Awake/alert Behavior During Therapy: WFL  for tasks assessed/performed Overall Cognitive Status: Within Functional Limits for tasks assessed       Memory: Decreased short-term memory (pt reported, "my mind isnt what it used to be.")              General Comments General comments (skin integrity, edema, etc.): Spouse stepped out of room for session but present during discussion about disposition.    Exercises     Assessment/Plan    PT Assessment Patient needs continued PT services  PT Problem List Decreased strength;Decreased mobility;Obesity;Decreased activity tolerance;Decreased range of motion;Decreased balance;Pain;Decreased skin integrity          PT Treatment Interventions Gait training;Therapeutic exercise;Patient/family education;Balance training;Functional mobility training;Therapeutic activities;DME instruction;Stair training    PT Goals (Current goals can be found in the Care Plan section)  Acute Rehab PT Goals Patient Stated Goal: to get stronger PT Goal Formulation: With patient Time For Goal Achievement: 03/04/16 Potential to Achieve Goals: Good    Frequency Min 3X/week   Barriers to discharge Inaccessible home environment;Decreased caregiver support      Co-evaluation               End of Session Equipment Utilized During Treatment: Gait belt Activity Tolerance: Patient tolerated treatment well Patient left: in chair;with call bell/phone within reach;with family/visitor present Nurse Communication: Mobility status         Time: 4098-1191 PT Time Calculation (min) (ACUTE ONLY): 35 min   Charges:   PT Evaluation $PT Eval Moderate Complexity: 1 Procedure PT Treatments $Therapeutic Activity: 8-22 mins   PT G Codes:        Gurjit Loconte A Taiden Raybourn 02/19/2016, 9:31 AM Mylo Red, PT, DPT [phone]

## 2016-02-19 NOTE — Progress Notes (Signed)
Patient ID: Brandy Hardy, female   DOB: Aug 05, 1955, 60 y.o.   MRN: [id number]  [facility] Surgery Progress Note  3 Days Post-Op  Subjective: No complaints today. Tolerating dressing changes. Denies abdominal pain, n/v.  Objective: Vital signs in last 24 hours: Temp:  [98.4 F (36.9 C)-98.7 F (37.1 C)] 98.4 F (36.9 C) (11/14 0500) Resp:  [20] 20 (11/13 1445) BP: (117)/(61) 117/61 (11/14 0500) Weight:  [300 lb 9.6 oz (136.4 kg)] 300 lb 9.6 oz (136.4 kg) (11/14 0500) Last BM Date: 02/17/16  Intake/Output from previous day: 11/13 0701 - 11/14 0700 In: 3995 [P.O.:600; I.V.:2745; IV Piggyback:650] Out: 150 [Urine:150] Intake/Output this shift: No intake/output data recorded.  PE: Gen:  Alert, NAD, pleasant Pulm:  Effort normal Abd: obese, Soft, NT/ND Skin: packing removed from mons pubis wound with some purulence and foul odor, penrose drain in place. Smaller right thigh wound also with some purulent drainage as well, penrose drain in place. Mild surrounding erythema  Lab Results:   Recent Labs  02/18/16 1109 02/19/16 0410  WBC 15.8* 13.4*  HGB 8.7* 8.5*  HCT 27.3* 26.4*  PLT 283 280   BMET  Recent Labs  02/18/16 1109 02/19/16 0410  NA 138 138  K 3.7 4.4  CL 103 104  CO2 29 27  GLUCOSE 123* 103*  BUN 7 7  CREATININE 0.59 0.62  CALCIUM 7.1* 7.3*   PT/INR No results for input(s): LABPROT, INR in the last 72 hours. CMP     Component Value Date/Time   NA 138 02/19/2016 0410   K 4.4 02/19/2016 0410   CL 104 02/19/2016 0410   CO2 27 02/19/2016 0410   GLUCOSE 103 (H) 02/19/2016 0410   BUN 7 02/19/2016 0410   CREATININE 0.62 02/19/2016 0410   CALCIUM 7.3 (L) 02/19/2016 0410   PROT 4.0 (L) 02/18/2016 1109   ALBUMIN 1.0 (L) 02/18/2016 1109   AST 43 (H) 02/18/2016 1109   ALT 33 02/18/2016 1109   ALKPHOS 86 02/18/2016 1109   BILITOT 0.6 02/18/2016 1109   GFRNONAA >60 02/19/2016 0410   GFRAA >60 02/19/2016 0410   Lipase  No results found for:  LIPASE     Studies/Results: No results found.  Anti-infectives: Anti-infectives    Start     Dose/Rate Route Frequency Ordered Stop   02/18/16 0800  fluconazole (DIFLUCAN) tablet 200 mg     200 mg Oral Daily 02/17/16 1159     02/16/16 1800  vancomycin (VANCOCIN) 1,250 mg in sodium chloride 0.9 % 250 mL IVPB     1,250 mg 166.7 mL/hr over 90 Minutes Intravenous Every 12 hours 02/16/16 1624     02/14/16 1000  vancomycin (VANCOCIN) IVPB 750 mg/150 ml premix  Status:  Discontinued     750 mg 150 mL/hr over 60 Minutes Intravenous Every 12 hours 02/14/16 0351 02/15/16 1057   02/14/16 1000  piperacillin-tazobactam (ZOSYN) IVPB 3.375 g     3.375 g 12.5 mL/hr over 240 Minutes Intravenous Every 8 hours 02/14/16 0351     02/14/16 0600  fluconazole (DIFLUCAN) IVPB 200 mg  Status:  Discontinued     200 mg 100 mL/hr over 60 Minutes Intravenous Every 24 hours 02/14/16 0534 02/17/16 1159   02/14/16 0300  piperacillin-tazobactam (ZOSYN) IVPB 3.375 g     3.375 g 100 mL/hr over 30 Minutes Intravenous  Once 02/14/16 0246 02/14/16 0313   02/14/16 0300  vancomycin (VANCOCIN) IVPB 1000 mg/200 mL premix     1,000 mg 200 mL/hr over 60  Minutes Intravenous  Once 02/14/16 0246 02/14/16 0404   02/14/16 0245  cefTRIAXone (ROCEPHIN) 2 g in dextrose 5 % 50 mL IVPB  Status:  Discontinued     2 g 100 mL/hr over 30 Minutes Intravenous  Once 02/14/16 0231 02/14/16 0245       Assessment/Plan S/p incision and drainage of right thigh abscess, full thickness debridement of 40cm^2 right groin for necrotizing fasciitis 11/11 Dr. Sheliah Hatch - POD 3 - gram stain ABUNDANT WBC PRESENT, PREDOMINANTLY PMN RARE GRAM POSITIVE COCCI IN PAIRS RARE GRAM NEGATIVE RODS; culture pending - persistent low grade fevers - WBC trending down, 13.4 today  ID - zosyn 11/9>>, vanc 11/9>>, diflucan 11/9>> FEN - heart healthy VTE - heparin  Plan - patient does continue to have some purulent drainage from both wounds. Will discuss  possibility of further OR debridement with MD. For now continue BID wet to dry packing/dressing changes. Continue antibiotics and will follow cultures. Seen by WOC, will not be able to place vac.   LOS: 5 days    Edson Snowball , [facility] Surgery 02/19/2016, 9:04 AM Pager: [phone] Consults: [phone] Mon-Fri 7:00 am-4:30 pm Sat-Sun 7:00 am-11:30 am

## 2016-02-19 NOTE — Progress Notes (Signed)
Rehab Admissions Coordinator Note:  Patient was screened by Retta Diones for appropriateness for an Inpatient Acute Rehab Consult.  At this time, we are recommending Inpatient Rehab consult.  Retta Diones 02/19/2016, 10:36 AM  I can be reached at [phone].

## 2016-02-19 NOTE — Plan of Care (Signed)
Problem: Safety: Goal: Ability to remain free from injury will improve Outcome: Completed/Met Date Met: 02/19/16 Patient uses call light as instructed and is okay with her bed alarm being on for safety, she has her bedside stand with her personal belongings within reach.

## 2016-02-19 NOTE — Care Management Note (Addendum)
Case Management Note  Patient Details  Name: Brandy Hardy MRN: [id number] Date of Birth: January 30, 1956  Subjective/Objective:  Pt presented for weakness s/p fall. Hypotensive, Febrile with tachycardia upon admission. Treated for Sepsis. Pt with R groin abscess. Post I&D 02-16-16. Pt continues on IV Antibiotic Therapy. Consult placed for CIR and Rehab Admission Coordinator is following.                      Action/Plan: CM will continue to monitor for disposition needs.  Expected Discharge Date:                  Expected Discharge Plan:  [place]  In-House Referral:  Clinical Social Work  Discharge planning Services  CM Consult  Post Acute Care Choice:   N/A Choice offered to:    N/A  DME Arranged:   N/A DME Agency:   N/A  HH Arranged:   N/A HH Agency:   N/A  Status of Service:Completed If discussed at [place] Length of Stay Meetings, dates discussed:  02-19-16, 02-21-16  Additional Comments: 1533 02-21-16 Jacqlyn Krauss, RN, BSN [phone] Per Jerene Pitch with Surgery pt will be stable for d/c to CIR on 02-22-16. Per Surgery MD [facility] is aware and agreeable. CM did make [place] aware. No further needs from CM at this time.     [place] 02-21-16 Jacqlyn Krauss, RN,BSN [phone] Insurance has approved patient for CIR. CM did speak with MD Hongalgi in regards to idea of when pt may be stable to transfer. MD stated that Surgery would need to state that since I&D done 02-20-16. CM did speak with Jerene Pitch with Surgery. CM did provide [place] with [facility] number. CM will continue to follow the patient for disposition needs.  Bethena Roys, RN 02/19/2016, 3:57 PM

## 2016-02-19 NOTE — Plan of Care (Signed)
Problem: Pain Managment: Goal: General experience of comfort will improve Outcome: Progressing Patient uses pain medication sparingly and is able to describe her pain, where it is, if it radiates and when she needs pain medications. Patient is doing well with her pain and has been comfortable

## 2016-02-19 NOTE — Evaluation (Signed)
Occupational Therapy Evaluation Patient Details Name: Brandy Hardy MRN: [id number] DOB: 1955/05/07 Today's Date: 02/19/2016    History of Present Illness Patient is a 60 y/o female with hx of cervical cancer (radiation 2016),presents from home with weakness, falls, dysuria, suprapubic pain, and urinary frequency for past 5 days. Found to be febrile, tachycardic, and hypotensive secondary to sepsis from UTI.  Noted to have right groin abscess s/p I&D for necrotizing fasciitis.    Clinical Impression   Pt admitted with above. She demonstrates the below listed deficits and will benefit from continued OT to maximize safety and independence with BADLs.  Pt is limited by pain this session (just had undergone dressing change).  Pt limited to bed level activity due to pain 7/10.  Anticipate she will progress well as pain is better controlled.  She requires mod - total A for ADLs.  Recommend CIR.       Follow Up Recommendations  CIR;Supervision/Assistance - 24 hour    Equipment Recommendations  Tub/shower seat    Recommendations for Other Services       Precautions / Restrictions Precautions Precautions: Fall      Mobility Bed Mobility Overal bed mobility: Needs Assistance Bed Mobility: Rolling Rolling: Mod assist         General bed mobility comments: pt requires assist for panis and to shift hips   Transfers Overall transfer level: Needs assistance               General transfer comment: Pt unable to attempt due to pain     Balance                                            ADL Overall ADL's : Needs assistance/impaired Eating/Feeding: Independent   Grooming: Wash/dry hands;Wash/dry face;Oral care;Brushing hair;Set up;Bed level   Upper Body Bathing: Moderate assistance;Bed level   Lower Body Bathing: Total assistance;Bed level   Upper Body Dressing : Maximal assistance;Bed level;Sitting   Lower Body Dressing: Total assistance;Bed  level   Toilet Transfer: Total assistance Toilet Transfer Details (indicate cue type and reason): Pt unable to tolerate it due to pain - pt had just undergone dressing change.   She rolled to use bedpan  Toileting- Clothing Manipulation and Hygiene: Total assistance;Bed level       Functional mobility during ADLs: Maximal assistance;+2 for physical assistance;Moderate assistance General ADL Comments: Pt limited by pain      Vision     Perception     Praxis      Pertinent Vitals/Pain Pain Assessment: 0-10 Pain Score: 7  Pain Location: abdomen  Pain Descriptors / Indicators: Grimacing;Sharp Pain Intervention(s): Monitored during session;Repositioned     Hand Dominance Right   Extremity/Trunk Assessment Upper Extremity Assessment Upper Extremity Assessment: Generalized weakness   Lower Extremity Assessment Lower Extremity Assessment: Defer to PT evaluation   Cervical / Trunk Assessment Cervical / Trunk Assessment: Other exceptions Cervical / Trunk Exceptions: morbidly obese    Communication Communication Communication: No difficulties   Cognition Arousal/Alertness: Awake/alert Behavior During Therapy: WFL for tasks assessed/performed Overall Cognitive Status: Within Functional Limits for tasks assessed                     General Comments       Exercises       Shoulder Instructions      Home Living Family/patient  expects to be discharged to:: Private residence Living Arrangements: Spouse/significant other;Children;Other relatives (sister ) Available Help at Discharge: Family;Available 24 hours/day Type of Home: House Home Access: Stairs to enter [employer] of Steps: 4-5 Entrance Stairs-Rails: Right Home Layout: One level     Bathroom Shower/Tub: Producer, television/film/video: Handicapped height     Home Equipment: Environmental consultant - 2 wheels;Grab bars - tub/shower          Prior Functioning/Environment Level of Independence:  Independent with assistive device(s)        Comments: Using RW for ambulation in the last few days PTA. Cooks, cleans.         OT Problem List: Decreased strength;Decreased activity tolerance;Impaired balance (sitting and/or standing);Decreased knowledge of use of DME or AE;Obesity;Pain   OT Treatment/Interventions: Self-care/ADL training;Therapeutic exercise;DME and/or AE instruction;Therapeutic activities;Balance training;Patient/family education    OT Goals(Current goals can be found in the care plan section) Acute Rehab OT Goals Patient Stated Goal: to get better  OT Goal Formulation: With patient Time For Goal Achievement: 03/04/16 Potential to Achieve Goals: Good ADL Goals Pt Will Perform Grooming: with min assist;standing Pt Will Perform Upper Body Bathing: with min assist;sitting Pt Will Perform Lower Body Bathing: with min assist;sit to/from stand;with adaptive equipment Pt Will Perform Upper Body Dressing: with supervision;sitting Pt Will Perform Lower Body Dressing: with min assist;sit to/from stand;with adaptive equipment Pt Will Transfer to Toilet: with min assist;stand pivot transfer;bedside commode Pt/caregiver will Perform Home Exercise Program: Both right and left upper extremity;With theraband;With written HEP provided;Increased strength  OT Frequency: Min 2X/week   Barriers to D/C:            Co-evaluation              End of Session Nurse Communication: Mobility status  Activity Tolerance: Patient limited by pain Patient left: in bed;with call bell/phone within reach   Time: 1343-1419 OT Time Calculation (min): 36 min Charges:  OT General Charges $OT Visit: 1 Procedure OT Evaluation $OT Eval Moderate Complexity: 1 Procedure OT Treatments $Self Care/Home Management : 8-22 mins G-Codes:    Jaskarn Schweer M 03/01/16, 3:57 PM

## 2016-02-19 NOTE — Progress Notes (Signed)
Updated report received via [place] RN in patient's room using SBAR format, updated on new orders and events of the day, assumed care of patient.

## 2016-02-20 ENCOUNTER — Inpatient Hospital Stay (HOSPITAL_COMMUNITY): Payer: Managed Care, Other (non HMO) | Admitting: Anesthesiology

## 2016-02-20 ENCOUNTER — Encounter (HOSPITAL_COMMUNITY): Payer: Self-pay | Admitting: Anesthesiology

## 2016-02-20 ENCOUNTER — Encounter (HOSPITAL_COMMUNITY)
Admission: EM | Disposition: A | Discharge status: Rehabilitation Facility | Payer: Self-pay | Source: Home / Self Care | Attending: Internal Medicine

## 2016-02-20 DIAGNOSIS — L02214 Cutaneous abscess of groin: Secondary | ICD-10-CM | POA: Diagnosis present

## 2016-02-20 HISTORY — PX: IRRIGATION AND DEBRIDEMENT ABSCESS: SHX5252

## 2016-02-20 HISTORY — PX: I & D EXTREMITY: SHX5045

## 2016-02-20 LAB — BASIC METABOLIC PANEL
ANION GAP: 7 (ref 5–15)
BUN: 6 mg/dL (ref 6–20)
CALCIUM: 7.6 mg/dL — AB (ref 8.9–10.3)
CO2: 28 mmol/L (ref 22–32)
CREATININE: 0.69 mg/dL (ref 0.44–1.00)
Chloride: 104 mmol/L (ref 101–111)
Glucose, Bld: 132 mg/dL — ABNORMAL HIGH (ref 65–99)
Potassium: 3.8 mmol/L (ref 3.5–5.1)
SODIUM: 139 mmol/L (ref 135–145)

## 2016-02-20 LAB — CBC
HEMATOCRIT: 27.3 % — AB (ref 36.0–46.0)
HEMATOCRIT: 24.8 % — AB (ref 36.0–46.0)
Hemoglobin: 8.6 g/dL — ABNORMAL LOW (ref 12.0–15.0)
Hemoglobin: 8 g/dL — ABNORMAL LOW (ref 12.0–15.0)
MCH: 29.7 pg (ref 26.0–34.0)
MCH: 30.7 pg (ref 26.0–34.0)
MCHC: 31.5 g/dL (ref 30.0–36.0)
MCHC: 32.3 g/dL (ref 30.0–36.0)
MCV: 94.1 fL (ref 78.0–100.0)
MCV: 95 fL (ref 78.0–100.0)
PLATELETS: 324 10*3/uL (ref 150–400)
Platelets: 312 10*3/uL (ref 150–400)
RBC: 2.9 MIL/uL — ABNORMAL LOW (ref 3.87–5.11)
RBC: 2.61 MIL/uL — ABNORMAL LOW (ref 3.87–5.11)
RDW: 15.2 % (ref 11.5–15.5)
RDW: 15.4 % (ref 11.5–15.5)
WBC: 11.9 10*3/uL — ABNORMAL HIGH (ref 4.0–10.5)
WBC: 10.2 10*3/uL (ref 4.0–10.5)

## 2016-02-20 LAB — CULTURE, BLOOD (ROUTINE X 2)

## 2016-02-20 SURGERY — IRRIGATION AND DEBRIDEMENT EXTREMITY
Anesthesia: General | Site: Leg Upper | Laterality: Right

## 2016-02-20 MED ORDER — FENTANYL CITRATE (PF) 100 MCG/2ML IJ SOLN
INTRAMUSCULAR | Status: AC
  Filled 2016-02-20: qty 2

## 2016-02-20 MED ORDER — FENTANYL CITRATE (PF) 100 MCG/2ML IJ SOLN
25.0000 ug | INTRAMUSCULAR | Status: DC | PRN
  Administered 2016-02-20 (×2): 50 ug via INTRAVENOUS

## 2016-02-20 MED ORDER — FENTANYL CITRATE (PF) 100 MCG/2ML IJ SOLN
INTRAMUSCULAR | Status: AC
  Administered 2016-02-20: 50 ug via INTRAVENOUS
  Filled 2016-02-20: qty 2

## 2016-02-20 MED ORDER — PROPOFOL 10 MG/ML IV BOLUS
INTRAVENOUS | Status: DC | PRN
  Administered 2016-02-20: 70 mg via INTRAVENOUS
  Administered 2016-02-20: 130 mg via INTRAVENOUS

## 2016-02-20 MED ORDER — SUCCINYLCHOLINE 20MG/ML (10ML) SYRINGE FOR MEDFUSION PUMP - OPTIME
INTRAMUSCULAR | Status: DC | PRN
  Administered 2016-02-20: 120 mg via INTRAVENOUS

## 2016-02-20 MED ORDER — ROCURONIUM BROMIDE 10 MG/ML (PF) SYRINGE
PREFILLED_SYRINGE | INTRAVENOUS | Status: AC
  Filled 2016-02-20: qty 40

## 2016-02-20 MED ORDER — MIDAZOLAM HCL 2 MG/2ML IJ SOLN
INTRAMUSCULAR | Status: AC
  Filled 2016-02-20: qty 2

## 2016-02-20 MED ORDER — SUCCINYLCHOLINE CHLORIDE 20 MG/ML IJ SOLN
INTRAMUSCULAR | Status: DC | PRN
  Administered 2016-02-20: 80 mg via INTRAVENOUS

## 2016-02-20 MED ORDER — ONDANSETRON HCL 4 MG/2ML IJ SOLN
INTRAMUSCULAR | Status: AC
  Filled 2016-02-20: qty 2

## 2016-02-20 MED ORDER — ONDANSETRON 4 MG PO TBDP
4.0000 mg | ORAL_TABLET | Freq: Four times a day (QID) | ORAL | Status: DC | PRN
  Filled 2016-02-20: qty 1

## 2016-02-20 MED ORDER — LACTATED RINGERS IV SOLN
INTRAVENOUS | Status: DC
  Administered 2016-02-20 (×2): via INTRAVENOUS

## 2016-02-20 MED ORDER — VITAMIN D (ERGOCALCIFEROL) 1.25 MG (50000 UNIT) PO CAPS: 50000.0000 [IU] | ORAL_CAPSULE | ORAL | Status: DC

## 2016-02-20 MED ORDER — SUGAMMADEX SODIUM 500 MG/5ML IV SOLN
INTRAVENOUS | Status: AC
  Filled 2016-02-20: qty 5

## 2016-02-20 MED ORDER — LIDOCAINE 2% (20 MG/ML) 5 ML SYRINGE
INTRAMUSCULAR | Status: AC
  Filled 2016-02-20: qty 15

## 2016-02-20 MED ORDER — ONDANSETRON HCL 4 MG/2ML IJ SOLN
INTRAMUSCULAR | Status: AC
  Filled 2016-02-20: qty 4

## 2016-02-20 MED ORDER — 0.9 % SODIUM CHLORIDE (POUR BTL) OPTIME
TOPICAL | Status: DC | PRN
  Administered 2016-02-20: 1000 mL

## 2016-02-20 MED ORDER — ATORVASTATIN CALCIUM 40 MG PO TABS
40.0000 mg | ORAL_TABLET | Freq: Every day | ORAL | Status: DC
  Administered 2016-02-20 – 2016-02-22 (×3): 40 mg via ORAL
  Filled 2016-02-20 (×3): qty 1

## 2016-02-20 MED ORDER — PHENYLEPHRINE 40 MCG/ML (10ML) SYRINGE FOR IV PUSH (FOR BLOOD PRESSURE SUPPORT)
PREFILLED_SYRINGE | INTRAVENOUS | Status: AC
  Filled 2016-02-20: qty 10

## 2016-02-20 MED ORDER — PROPOFOL 10 MG/ML IV BOLUS
INTRAVENOUS | Status: AC
  Filled 2016-02-20: qty 20

## 2016-02-20 MED ORDER — SODIUM CHLORIDE 0.9 % IR SOLN
Status: DC | PRN
  Administered 2016-02-20: 3000 mL

## 2016-02-20 MED ORDER — MORPHINE SULFATE (PF) 4 MG/ML IV SOLN
INTRAVENOUS | Status: AC
  Administered 2016-02-20: 4 mg
  Filled 2016-02-20: qty 1

## 2016-02-20 MED ORDER — ENOXAPARIN SODIUM 40 MG/0.4ML ~~LOC~~ SOLN: 40.0000 mg | SUBCUTANEOUS | Status: DC

## 2016-02-20 MED ORDER — CHLORHEXIDINE GLUCONATE CLOTH 2 % EX PADS: 6.0000 | MEDICATED_PAD | Freq: Once | CUTANEOUS | Status: DC

## 2016-02-20 MED ORDER — OXYCODONE HCL 5 MG PO TABS: 5.0000 mg | ORAL_TABLET | Freq: Once | ORAL | Status: DC | PRN

## 2016-02-20 MED ORDER — ONDANSETRON HCL 4 MG/2ML IJ SOLN
INTRAMUSCULAR | Status: DC | PRN
  Administered 2016-02-20: 4 mg via INTRAVENOUS

## 2016-02-20 MED ORDER — SENNA 8.6 MG PO TABS
1.0000 | ORAL_TABLET | Freq: Two times a day (BID) | ORAL | Status: DC
  Administered 2016-02-21 – 2016-02-22 (×3): 8.6 mg via ORAL
  Filled 2016-02-20 (×3): qty 1

## 2016-02-20 MED ORDER — CHLORHEXIDINE GLUCONATE CLOTH 2 % EX PADS
6.0000 | MEDICATED_PAD | Freq: Once | CUTANEOUS | Status: AC
  Administered 2016-02-20: 6 via TOPICAL

## 2016-02-20 MED ORDER — PHENYLEPHRINE HCL 10 MG/ML IJ SOLN
INTRAMUSCULAR | Status: DC | PRN
  Administered 2016-02-20 (×2): 120 ug via INTRAVENOUS

## 2016-02-20 MED ORDER — OXYCODONE HCL 5 MG/5ML PO SOLN: 5.0000 mg | Freq: Once | ORAL | Status: DC | PRN

## 2016-02-20 MED ORDER — ONDANSETRON HCL 4 MG/2ML IJ SOLN: 4.0000 mg | Freq: Four times a day (QID) | INTRAMUSCULAR | Status: DC | PRN

## 2016-02-20 MED ORDER — DEXAMETHASONE SODIUM PHOSPHATE 10 MG/ML IJ SOLN
INTRAMUSCULAR | Status: AC
  Filled 2016-02-20: qty 1

## 2016-02-20 MED ORDER — MIDAZOLAM HCL 5 MG/5ML IJ SOLN
INTRAMUSCULAR | Status: DC | PRN
  Administered 2016-02-20 (×2): 2 mg via INTRAVENOUS

## 2016-02-20 MED ORDER — LACTATED RINGERS IV SOLN: INTRAVENOUS | Status: DC

## 2016-02-20 MED ORDER — LIDOCAINE HCL (CARDIAC) 20 MG/ML IV SOLN
INTRAVENOUS | Status: DC | PRN
  Administered 2016-02-20: 80 mg via INTRAVENOUS

## 2016-02-20 MED ORDER — SUCCINYLCHOLINE CHLORIDE 200 MG/10ML IV SOSY
PREFILLED_SYRINGE | INTRAVENOUS | Status: AC
  Filled 2016-02-20: qty 10

## 2016-02-20 MED ORDER — VANCOMYCIN HCL 10 G IV SOLR
1250.0000 mg | Freq: Two times a day (BID) | INTRAVENOUS | Status: DC
  Administered 2016-02-20 – 2016-02-21 (×3): 1250 mg via INTRAVENOUS
  Filled 2016-02-20 (×6): qty 1250

## 2016-02-20 SURGICAL SUPPLY — 37 items
BENZOIN TINCTURE PRP APPL 2/3 (GAUZE/BANDAGES/DRESSINGS) ×8 IMPLANT
BNDG GAUZE ELAST 4 BULKY (GAUZE/BANDAGES/DRESSINGS) ×8 IMPLANT
CANISTER SUCTION 2500CC (MISCELLANEOUS) ×4 IMPLANT
CHLORAPREP W/TINT 26ML (MISCELLANEOUS) IMPLANT
COVER SURGICAL LIGHT HANDLE (MISCELLANEOUS) ×8 IMPLANT
DRAPE HALF SHEET 40X57 (DRAPES) ×8 IMPLANT
DRAPE PROXIMA HALF (DRAPES) ×8 IMPLANT
DRAPE UTILITY XL STRL (DRAPES) ×8 IMPLANT
DRSG PAD ABDOMINAL 8X10 ST (GAUZE/BANDAGES/DRESSINGS) ×16 IMPLANT
ELECT REM PT RETURN 9FT ADLT (ELECTROSURGICAL) ×4
ELECTRODE REM PT RTRN 9FT ADLT (ELECTROSURGICAL) ×2 IMPLANT
GAUZE SPONGE 4X4 12PLY STRL (GAUZE/BANDAGES/DRESSINGS) ×8 IMPLANT
GLOVE BIO SURGEON STRL SZ7.5 (GLOVE) ×12 IMPLANT
GLOVE BIOGEL PI IND STRL 7.5 (GLOVE) ×6 IMPLANT
GLOVE BIOGEL PI INDICATOR 7.5 (GLOVE) ×6
GLOVE EUDERMIC 7 POWDERFREE (GLOVE) ×12 IMPLANT
GLOVE SURG SS PI 8.0 STRL IVOR (GLOVE) ×4 IMPLANT
GOWN STRL REUS W/ TWL LRG LVL3 (GOWN DISPOSABLE) ×4 IMPLANT
GOWN STRL REUS W/ TWL XL LVL3 (GOWN DISPOSABLE) ×4 IMPLANT
GOWN STRL REUS W/TWL LRG LVL3 (GOWN DISPOSABLE) ×4
GOWN STRL REUS W/TWL XL LVL3 (GOWN DISPOSABLE) ×4
HANDPIECE INTERPULSE COAX TIP (DISPOSABLE) ×2
KIT BASIN OR (CUSTOM PROCEDURE TRAY) ×4 IMPLANT
KIT ROOM TURNOVER OR (KITS) ×4 IMPLANT
MARKER SKIN DUAL TIP RULER LAB (MISCELLANEOUS) ×8 IMPLANT
NS IRRIG 1000ML POUR BTL (IV SOLUTION) ×4 IMPLANT
PACK GENERAL/GYN (CUSTOM PROCEDURE TRAY) ×8 IMPLANT
PAD ABD 8X10 STRL (GAUZE/BANDAGES/DRESSINGS) ×16 IMPLANT
PAD ARMBOARD 7.5X6 YLW CONV (MISCELLANEOUS) ×8 IMPLANT
SET HNDPC FAN SPRY TIP SCT (DISPOSABLE) ×2 IMPLANT
STOCKINETTE IMPERVIOUS 9X36 MD (GAUZE/BANDAGES/DRESSINGS) IMPLANT
TAPE CLOTH SURG 4X10 WHT LF (GAUZE/BANDAGES/DRESSINGS) ×4 IMPLANT
TAPE CLOTH SURG 6X10 WHT LF (GAUZE/BANDAGES/DRESSINGS) ×4 IMPLANT
TOWEL OR 17X24 6PK STRL BLUE (TOWEL DISPOSABLE) ×4 IMPLANT
TOWEL OR 17X26 10 PK STRL BLUE (TOWEL DISPOSABLE) ×8 IMPLANT
UNDERPAD 30X30 (UNDERPADS AND DIAPERS) ×4 IMPLANT
WATER STERILE IRR 1000ML POUR (IV SOLUTION) ×4 IMPLANT

## 2016-02-20 NOTE — Op Note (Signed)
Patient Name:           Brandy Hardy   Date of Surgery:        02/20/2016  Pre op Diagnosis:      Soft tissue infection right groin                                      soft tissue infection right upper medial thigh                                      History cervical cancer, TAH and BSO, multiple treatments with radiation therapy  Post op Diagnosis:    same  Procedure:                 Debridement of skin subcutaneous tissue and fascia right groin wound                                      Debridement to be monitored skin subcutaneous tissue of right thigh wound                                      Insertion of Foley catheter                                        1.  Progress note or procedure note with a detailed description of the procedure.(see below)  2.  Tool used for debridement (curette, scapel, etc.)  scissors and electrocautery  3.  Frequency of surgical debridement.   Last debridement 02/16/2016  4.  Measurement of total devitalized tissue (wound surface) before and after surgical debridement.     5.  Area and depth of devitalized tissue removed from wound.  Right groin wound preop dimensions 8 cm long by 8 cm wide by 6 cm deep.  Right groin wound post debridement 25 cm long by 8 cm wide by 6 cm deep right 5 wound preop dimensions 4 cm x 4 cm x 2 cm deep.  Right thigh wound post op dimensions 15 cm long by 6 cm wide by 4 cm deep  6.  Blood loss and description of tissue removed.  Estimated blood loss 50 mL.  Skin and subcutaneous tissue and small amounts of fascia removed.  7.  Evidence of the progress of the wound's response to treatment.  A.  Current wound volume (current dimensions and depth).  2 wounds.  Right groin current dimensions 25 cm long by 8 cm wide by 6 cm deep.  Right thigh wound current dimensions 15 cm long by 6 cm wide by 4 cm deep  B.  Presence (and extent of) of infection.  Deep subcutaneous tissue and minor fascial infection  C.  Presence (and extent  of) of non viable tissue.  Scattered non-viable skin and fat  D.  Other material in the wound that is expected to inhibit healing.  History radiation therapy for cervical cancer will inhibit healing  8.  Was there any viable tissue removed (measurements):    Surgeon:  Edsel Petrin. Dalbert Batman, M.D., FACS  Assistant:                      OR staff   Indication for Assistant: N/A  Operative Indications:   This is a 60 year old female with morbid obesity.  History of cervical cancer.  History of 3 rounds of radiation therapy.  History of total hysterectomy.  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  History of urinary retention.  On 02/16/2016 she was taken to the operating room by Dr. Kieth Brightly and underwent debridement of infected skin and subcutaneous continue tissue of the right groin and debridement of skin and subcutaneous infected tissue of the right proximal medial thigh.  The infection has stabilized but she continues to drain purulent fluid.  She is return to the operating room for wound exploration and debridement as indicated.  She complained of urinary frequency and requested Foley catheter insertion.  I had to insert the Foley catheter myself because of the great difficulty in identifying the urethra and the periurethral ulcerations.  Operative Findings:       Infected skin and subcutaneous tissue and a little bit of fascia in the thigh wound.  Paraurethral ulcerations.  Urinary retention with immediate return of 500 mL of clear urine from the bladder.  Procedure in Detail:          Following the induction of general endotracheal anesthesia the patient was placed in a frog-leg position.  It took 4 people to expose her urethral and vaginal areas.  The nursing staff was unable to insert the Foley catheter.  I put on gown and gloves and found the urethra and inserted the Foley catheter.  It slid in easily without stricture.  The urine was clear but copious.      The patient was prepped and draped in a frog-leg position.  I found that both incisions in the groin and the thigh had a Penrose drains which were removed.  I opened up both wounds extensively and debrided skin and subcutaneous tissue and a little bit of fascia from the groin wound.  This  Debridement had to go much further laterally for both wounds and a little bit medially.  Please see wound dimensions above.  Hemostasis was excellent and achieved with electrocautery.  The bleeding was well controlled.  Pulsatile irrigator was used in both wounds to clean things up and everything looked clean at the end of the case with no more purulence or odor.  Both wounds were packed with saline once Kerlix and covered with dry gauze.  The patient tolerated surgery well was taken to PACU in stable condition.  EBL 50 mL.  Counts correct.  Complications none .     Edsel Petrin. Dalbert Batman, M.D., FACS General and Minimally Invasive Surgery Breast and Colorectal Surgery  02/20/2016 4:50 PM

## 2016-02-20 NOTE — Progress Notes (Signed)
Inpatient Rehabilitation  I met with the patient to discuss team's recommendation for IP Rehab.  I shared booklets and answered questions.  She is motivated to regain her independence and work with therapies.  Note that patient will return to the OR today; however, I will initiate insurance authorization and follow for timing of medical readiness, insurance authorization, and bed availability.  Please call with questions.   Carmelia Roller., CCC/SLP Admission Coordinator  [place]  Cell [phone]

## 2016-02-20 NOTE — Transfer of Care (Signed)
Immediate Anesthesia Transfer of Care Note  Patient: Brandy Hardy  Procedure(s) Performed: Procedure(s): IRRIGATION AND DEBRIDEMENT EXTREMITY (Right)  Patient Location: PACU  Anesthesia Type:General  Level of Consciousness: awake, alert , oriented and patient cooperative  Airway & Oxygen Therapy: Patient Spontanous Breathing and Patient connected to nasal cannula oxygen  Post-op Assessment: Report given to RN and Post -op Vital signs reviewed and stable  Post vital signs: Reviewed and stable  Last Vitals:  Vitals:   02/20/16 1336 02/20/16 1640  BP: (!) 152/94   Pulse: 75 74  Resp: 17 (!) 26  Temp: 36.9 C 36.6 C    Last Pain:  Vitals:   02/20/16 1336  TempSrc: Oral  PainSc:       Patients Stated Pain Goal: 3 ([id number])  Complications: No apparent anesthesia complications

## 2016-02-20 NOTE — Progress Notes (Addendum)
Pharmacy Antibiotic Note  Brandy Hardy is a 60 y.o. female admitted on 02/14/2016 with pelvic abscess with nectroizing fascitis status post I&D and suspected urogenital candidiasis.  Pharmacy has been consulted for Zosyn and Fluconazole dosing - day #7 of therapy. Wound still with purulent drainage. Leukocytosis slowly improving. Renal function remains stable for last 3 days. Follow-up abscess culture for narrowing.  Plan: Zosyn 3.375g IV q8h (4 hour infusion). Continue Fluconazole 200mg  oral every 24 hours.  Follow-up duration of therapy. Monitor renal function.   Addendum: Re-start Vancomycin with gram + in wound abscess.   Plan: Restart Vancomycin 1250mg  IV every 12 hours. Follow-up renal function.  Height: 5\' 3"  (160 cm) Weight: 298 lb 4.8 oz (135.3 kg) IBW/kg (Calculated) : 52.4  Temp (24hrs), Avg:98.5 F (36.9 C), Min:98.4 F (36.9 C), Max:98.6 F (37 C)   Recent Labs Lab 02/14/16 0245 02/14/16 0517 02/14/16 0636 02/14/16 0814  02/15/16 0440 02/16/16 0510 02/17/16 0500 02/18/16 1109 02/19/16 0410 02/20/16 0515  WBC  --   --   --   --   < > 16.1* 15.4* 16.4* 15.8* 13.4* 11.9*  CREATININE  --   --   --   --   < > 1.32* 0.80 0.60 0.59 0.62  --   LATICACIDVEN 4.72* 2.16* 1.6 1.2  --   --   --   --   --   --   --   < > = values in this interval not displayed.  Estimated Creatinine Clearance: 101.1 mL/min (by C-G formula based on SCr of 0.62 mg/dL).    No Known Allergies  Antimicrobials this admission:  11/9 vanc >>11/10, 11/11 >>11/14; 11/15 >> 11/9 zosyn >> 11/9 fluconazole >>  Dose adjustments this admission: none  Microbiology results:  11/9 MRSA - negative 11/9 blood x2 - 1/2 Bacteroides fragilis beta-lactamase positive 11/9 urine - insignificant growth 11/9 RVP -negative 11/11 wound abscess groin - rare gram+ cocci in pairs, rare gram- rods: pending  Thank you for allowing pharmacy to be a part of this patient's care.  [name] 02/20/2016 2:35 PM

## 2016-02-20 NOTE — Anesthesia Preprocedure Evaluation (Signed)
Anesthesia Evaluation  Patient identified by MRN, date of birth, ID band Patient awake    Reviewed: Allergy & Precautions, NPO status , Patient's Chart, lab work & pertinent test results  History of Anesthesia Complications Negative for: history of anesthetic complications  Airway Mallampati: III  TM Distance: >3 FB Neck ROM: Full    Dental  (+) Teeth Intact, Dental Advisory Given   Pulmonary neg pulmonary ROS,    Pulmonary exam normal breath sounds clear to auscultation       Cardiovascular hypertension, Normal cardiovascular exam Rhythm:Regular Rate:Normal     Neuro/Psych negative neurological ROS     GI/Hepatic negative GI ROS, Neg liver ROS,   Endo/Other  diabetesMorbid obesity  Renal/GU negative Renal ROS     Musculoskeletal negative musculoskeletal ROS (+)   Abdominal   Peds  Hematology  (+) Blood dyscrasia, anemia ,   Anesthesia Other Findings Day of surgery medications reviewed with the patient.  Groin abscess, septic shock  Reproductive/Obstetrics Cervical cancer s/p chemo/radiation                             Anesthesia Physical Anesthesia Plan  ASA: III  Anesthesia Plan: General   Post-op Pain Management:    Induction: Intravenous  Airway Management Planned: Oral ETT  Additional Equipment: None  Intra-op Plan:   Post-operative Plan: Extubation in OR  Informed Consent: I have reviewed the patients History and Physical, chart, labs and discussed the procedure including the risks, benefits and alternatives for the proposed anesthesia with the patient or authorized representative who has indicated his/her understanding and acceptance.   Dental advisory given  Plan Discussed with: CRNA and Surgeon  Anesthesia Plan Comments:         Anesthesia Quick Evaluation

## 2016-02-20 NOTE — Progress Notes (Signed)
Brandy Hardy  ZOX:[id number] DOB: [birth year] DOA: 02/14/2016 PCP: Lupe Carney, MD    Brief Narrative:  60 y.o. female who presented to ED for weakness s/p fall without LOC or head trauma. Was experiencing dysuria, suprapubic pain, and urinary frequency for 5 days. Was evaluated at Urgent Care facility and diagnosed with UTI. Was given Rx for Norco, Nitrofurantoin, and Azo. She had taken 3 days of antibiotics. A urine culture was sent but patient is unaware of the results. After starting these medications, dysuria improved but her other symptoms did not.   In the ED, patient was found to be febrile, tachycardic, and hypotensive. Labs were significant for K 2.4, leukocytosis of 20.9, and lactic acid of 4.7. UA appeared consistent with an infectious process.  Code sepsis was called. Patient was given NS 4L bolus without improvement in blood pressure. Therefore, Levophed was initiated. Broad spectrum antibiotics were initiated.  Status post incision and drainage of right thigh abscess, full thickness debridement of 40cm^2 right groin for necrotizing fasciitis 11/11.    Significant Events: 11/9 Admit for septic shock 11/10 [facility] assumed care    Subjective: Patient was seen this morning prior to surgery. Denied complaints. No pain reported.  Assessment & Plan: Septic Shock due to necrotizing fasciitis levophed now off - urine culture <10,000 colonies - continue broad-spectrum empiric coverage Follow culture from incision and drainage on 11/11 >pending Blood culture 1 from 02/14/16: Shows Bacteroides fragilis? Significance.  Mons pubis and right thigh necrotizing fasciitis Continue broad-spectrum antibiotics -added vancomycin to Zosyn and fluconazole on 11/11. Gen. surgery consulted.  Status post incision and drainage of right thigh abscess, full thickness debridement of 40cm^2 right groin for necrotizing fasciitis 11/11. Gram stain shows abundant gram-positive cocci in pairs and gram-negative  rods. Appreciate surgery input.  Continue twice a day daily packing changes. Consulted wound care nurse for recommendations.  Surgical follow-up 11/15 appreciated and plan to take to or today for repeat I&D.   AKI  Due to above (bl Cr 0.6)  - Resolved  Severe protein calorie malnutrition-albumin 1.1, nutrition therapy following  Mildly elevated troponin  Follow to [facility] - no sx at present to suggest USAP/denies CP 2-D echo  showed EF of 65-70%, wall motion was normal, no regional wall motion abnormalities,  Hypokalemia   Replaced. Mg is normal   Candidal intertrigo -patient on fluconazole   Morbid obesity Body mass index is 52.84 kg/m.   Anemia -Follow CBCs.   History of cervical cancer status post radiation  Essential hypertension - Not on medications prior to admission. Reasonable inpatient control.   DVT prophylaxis: SQ heparin  Code Status: FULL CODE Family Communication: Discussed in detail with patient's spouse at bedside.  Disposition Plan:  Final disposition will be per general surgery, inpatient rehab ?  Consultants:  PCCM General surgery Rehabilitation M.D.    Antimicrobials:  Vancomycin 11/8 > 11/10, 11/11> Zosyn 11/8 >  Objective:  Vitals:   02/19/16 0500 02/19/16 2100 02/20/16 0500 02/20/16 1336  BP: 117/61 116/61 139/65 (!) 152/94  Pulse:  85 72 75  Resp:  20 (!) 28 17  Temp: 98.4 F (36.9 C) 98.6 F (37 C) 98.6 F (37 C) 98.4 F (36.9 C)  TempSrc: Oral Oral Oral Oral  SpO2:  96% 95% 97%  Weight: (!) 136.4 kg (300 lb 9.6 oz)  135.3 kg (298 lb 4.8 oz)   Height:         Intake/Output Summary (Last 24 hours) at 02/20/16 1559 Last data  filed at 02/20/16 0522  Gross per 24 hour  Intake               50 ml  Output                0 ml  Net               50 ml   Filed Weights   02/18/16 0511 02/19/16 0500 02/20/16 0500  Weight: 133.5 kg (294 lb 6.4 oz) (!) 136.4 kg (300 lb 9.6 oz) 135.3 kg (298 lb 4.8 oz)    Examination: General: No  acute respiratory distress Lungs: Clear to auscultation bilaterally without wheezes or crackles Cardiovascular: Regular rate and rhythm without murmur gallop or rub normal S1 and S2. Telemetry: Sinus rhythm. Abdomen: Nontender, nondistended, soft, bowel sounds positive, no rebound, no ascites, no appreciable mass - dressing over wound is clean and dry. Drain showing purulent drainage. Extremities: No significant cyanosis, clubbing, or edema bilateral lower extremities  CBC:  Recent Labs Lab 02/14/16 0228  02/16/16 0510 02/17/16 0500 02/18/16 1109 02/19/16 0410 02/20/16 0515  WBC 20.9*  < > 15.4* 16.4* 15.8* 13.4* 11.9*  NEUTROABS 19.9*  --   --   --   --   --   --   HGB 11.5*  < > 9.0* 9.6* 8.7* 8.5* 8.6*  HCT 34.5*  < > 27.6* 28.9* 27.3* 26.4* 27.3*  MCV 92.5  < > 90.2 91.5 93.5 94.3 94.1  PLT 287  < > 266 243 283 280 312  < > = values in this interval not displayed. Basic Metabolic Panel:  Recent Labs Lab 02/14/16 0339 02/14/16 1115 02/15/16 0440 02/16/16 0510 02/17/16 0500 02/18/16 1109 02/19/16 0410  NA  --  133* 134* 139 138 138 138  K  --  2.9* 2.9* 3.2* 3.2* 3.7 4.4  CL  --  98* 100* 104 103 103 104  CO2  --  26 25 27 28 29 27   GLUCOSE  --  166* 120* 108* 108* 123* 103*  BUN  --  15 22* 16 7 7 7   CREATININE  --  0.84 1.32* 0.80 0.60 0.59 0.62  CALCIUM  --  6.9* 6.8* 7.1* 7.2* 7.1* 7.3*  MG 1.9 1.7 1.9  --   --  1.8  --   PHOS  --  1.6* 2.1*  --   --   --   --    GFR: Estimated Creatinine Clearance: 101.1 mL/min (by C-G formula based on SCr of 0.62 mg/dL).  Liver Function Tests:  Recent Labs Lab 02/14/16 0228 02/17/16 0500 02/18/16 1109  AST 83* 59* 43*  ALT 46 43 33  ALKPHOS 118 93 86  BILITOT 0.9 1.0 0.6  PROT 5.7* 4.3* 4.0*  ALBUMIN 1.6* 1.1* 1.0*   Cardiac Enzymes:  Recent Labs Lab 02/14/16 0636 02/14/16 1115 02/14/16 1715 02/15/16 1445 02/16/16 0510  TROPONINI 0.05* 0.03* 0.11* 0.70* 0.05*    HbA1C: Hgb A1c MFr Bld  Date/Time  Value Ref Range Status  10/08/2015 10:36 AM 5.6 4.6 - 6.5 % Final    Comment:    Glycemic Control Guidelines for People with Diabetes:Non Diabetic:  <6%Goal of Therapy: <7%Additional Action Suggested:  >8%   02/27/2015 08:19 AM 5.7 4.6 - 6.5 % Final    Comment:    Glycemic Control Guidelines for People with Diabetes:Non Diabetic:  <6%Goal of Therapy: <7%Additional Action Suggested:  >8%     Recent Results (from the past 240 hour(s))  Urine  culture     Status: Abnormal   Collection Time: 02/14/16  2:28 AM  Result Value Ref Range Status   Specimen Description URINE, RANDOM  Final   Special Requests NONE  Final   Culture <10,000 COLONIES/mL INSIGNIFICANT GROWTH (A)  Final   Report Status 02/15/2016 FINAL  Final  Blood Culture (routine x 2)     Status: Abnormal   Collection Time: 02/14/16  2:40 AM  Result Value Ref Range Status   Specimen Description BLOOD LEFT ARM  Final   Special Requests BOTTLES DRAWN AEROBIC AND ANAEROBIC  Final   Culture  Setup Time   Final    GRAM NEGATIVE RODS ANAEROBIC BOTTLE ONLY CRITICAL RESULT CALLED TO, READ BACK BY AND VERIFIED WITH: Hollie Beach AT 2952 02/17/16 BY L BENFIELD    Culture BACTEROIDES FRAGILIS BETA LACTAMASE POSITIVE  (A)  Final   Report Status 02/20/2016 FINAL  Final  Blood Culture ID Panel (Reflexed)     Status: None   Collection Time: 02/14/16  2:40 AM  Result Value Ref Range Status   Enterococcus species NOT DETECTED NOT DETECTED Final   Listeria monocytogenes NOT DETECTED NOT DETECTED Final   Staphylococcus species NOT DETECTED NOT DETECTED Final   Staphylococcus aureus NOT DETECTED NOT DETECTED Final   Streptococcus species NOT DETECTED NOT DETECTED Final   Streptococcus agalactiae NOT DETECTED NOT DETECTED Final   Streptococcus pneumoniae NOT DETECTED NOT DETECTED Final   Streptococcus pyogenes NOT DETECTED NOT DETECTED Final   Acinetobacter baumannii NOT DETECTED NOT DETECTED Final   Enterobacteriaceae species NOT  DETECTED NOT DETECTED Final   Enterobacter cloacae complex NOT DETECTED NOT DETECTED Final   Escherichia coli NOT DETECTED NOT DETECTED Final   Klebsiella oxytoca NOT DETECTED NOT DETECTED Final   Klebsiella pneumoniae NOT DETECTED NOT DETECTED Final   Proteus species NOT DETECTED NOT DETECTED Final   Serratia marcescens NOT DETECTED NOT DETECTED Final   Haemophilus influenzae NOT DETECTED NOT DETECTED Final   Neisseria meningitidis NOT DETECTED NOT DETECTED Final   Pseudomonas aeruginosa NOT DETECTED NOT DETECTED Final   Candida albicans NOT DETECTED NOT DETECTED Final   Candida glabrata NOT DETECTED NOT DETECTED Final   Candida krusei NOT DETECTED NOT DETECTED Final   Candida parapsilosis NOT DETECTED NOT DETECTED Final   Candida tropicalis NOT DETECTED NOT DETECTED Final  MRSA PCR Screening     Status: None   Collection Time: 02/14/16  6:04 AM  Result Value Ref Range Status   MRSA by PCR NEGATIVE NEGATIVE Final    Comment:        The GeneXpert MRSA Assay (FDA approved for NASAL specimens only), is one component of a comprehensive MRSA colonization surveillance program. It is not intended to diagnose MRSA infection nor to guide or monitor treatment for MRSA infections.   Surgical pcr screen     Status: None   Collection Time: 02/16/16  5:00 PM  Result Value Ref Range Status   MRSA, PCR NEGATIVE NEGATIVE Final   Staphylococcus aureus NEGATIVE NEGATIVE Final    Comment:        The Xpert SA Assay (FDA approved for NASAL specimens in patients over 53 years of age), is one component of a comprehensive surveillance program.  Test performance has been validated by [facility] for patients greater than or equal to 6 year old. It is not intended to diagnose infection nor to guide or monitor treatment.   Aerobic/Anaerobic Culture (surgical/deep wound)     Status: None (  Preliminary result)   Collection Time: 02/16/16  7:52 PM  Result Value Ref Range Status   Specimen  Description ABSCESS GROIN  Final   Special Requests NONE  Final   Gram Stain   Final    ABUNDANT WBC PRESENT, PREDOMINANTLY PMN RARE GRAM POSITIVE COCCI IN PAIRS RARE GRAM NEGATIVE RODS    Culture NO GROWTH 4 DAYS HOLDING FOR POSSIBLE ANAEROBE  Final   Report Status PENDING  Incomplete     Scheduled Meds: . Chlorhexidine Gluconate Cloth  6 each Topical Once  . [MAR Hold] feeding supplement (ENSURE ENLIVE)  237 mL Oral TID WC  . [MAR Hold] fluconazole  200 mg Oral Daily  . [MAR Hold] heparin  5,000 Units Subcutaneous Q8H  . [MAR Hold] multivitamin with minerals  1 tablet Oral Daily  . [MAR Hold] pantoprazole  40 mg Oral Daily  . [MAR Hold] piperacillin-tazobactam (ZOSYN)  IV  3.375 g Intravenous Q8H  . [MAR Hold] sodium chloride  500 mL Intravenous Once   Continuous Infusions: . 0.9 % NaCl with KCl 40 mEq / L 125 mL/hr (02/20/16 0317)  . lactated ringers 50 mL/hr at 02/20/16 1505     LOS: 6 days     Nicodemus Denk, MD, FACP, FHM. [facility] Pager [phone]  If 7PM-7AM, please contact night-coverage [email or web address] Password [email or web address] 02/20/2016, 4:09 PM

## 2016-02-20 NOTE — Progress Notes (Signed)
4 Days Post-Op  Subjective: Stable and alert.  Pleasant and cooperative. Still having a fair amount of purulent drainage from the wound No increase in pain, although still tender Afebrile.  W BC 13,400.  Slightly less.  Hemoglobin 8.5  Objective: Vital signs in last 24 hours: Temp:  [98.6 F (37 C)] 98.6 F (37 C) (11/14 2100) Pulse Rate:  [85] 85 (11/14 2100) Resp:  [20] 20 (11/14 2100) BP: (116)/(61) 116/61 (11/14 2100) SpO2:  [96 %] 96 % (11/14 2100) Last BM Date: 02/17/16  Intake/Output from previous day: 11/14 0701 - 11/15 0700 In: 240 [P.O.:240] Out: -  Intake/Output this shift: No intake/output data recorded.  General appearance: Alert and cooperative. Resp: clear to auscultation bilaterally GI: Obese.  Soft.  Nontender. Skin: Skin color, texture, turgor normal. No rashes or lesions or Right groin wound communicates with right upper medial thigh wound with Penrose drain.  Moderate purulence.  No skin necrosis.  A little bit of saline as an thickening medially on 5.  Still seems localized.  Lab Results:  No results found for this or any previous visit (from the past 24 hour(s)).   Studies/Results: No results found.  . feeding supplement (ENSURE ENLIVE)  237 mL Oral TID WC  . fluconazole  200 mg Oral Daily  . heparin  5,000 Units Subcutaneous Q8H  . multivitamin with minerals  1 tablet Oral Daily  . pantoprazole  40 mg Oral Daily  . piperacillin-tazobactam (ZOSYN)  IV  3.375 g Intravenous Q8H  . sodium chloride  500 mL Intravenous Once     Assessment/Plan: s/p Procedure(s): IRRIGATION AND DEBRIDEMENT RIGHT GROIN ABSCESS, FULL THICKNESS DEBRIDMENT RIGHT GROIN INCLUDING > 40CM SQUARE.  S/p incision and drainage of right thigh abscess, full thickness debridement of 40cm^2 right groin for necrotizing fasciitis11/11 Dr. Kieth Brightly - POD 3 - gram stain ABUNDANT WBC PRESENT, PREDOMINANTLY PMN RARE GRAM POSITIVE COCCI IN PAIRS RARE GRAM NEGATIVE RODS; culture  pending - persistent low grade fevers - WBC trending down, 13.4 today  ID - zosyn 11/9>>, vanc 11/9>>, diflucan 11/9>> FEN - heart healthy VTE - heparin  Plan - patient does continue to have some purulent drainage from both wounds.   She does not have progressive sepsis, however I think the best thing would be to examine the wound under anesthesia, debride and as necessary and open it up better to facilitate wound care and drainage.   I discussed the indications, details, techniques and numerous risk of the surgery with her.  We will plan to surgery this afternoon.   She is understanding of these issues and is comfortable with this plan.   Her only complaint is having to be nothing by mouth for most of the day.  @PROBHOSP @  LOS: 6 days    Madyn Ivins M 02/20/2016  . .prob

## 2016-02-21 ENCOUNTER — Encounter (HOSPITAL_COMMUNITY): Payer: Self-pay | Admitting: Physical Medicine and Rehabilitation

## 2016-02-21 LAB — CBC
HEMATOCRIT: 27.5 % — AB (ref 36.0–46.0)
HEMOGLOBIN: 8.8 g/dL — AB (ref 12.0–15.0)
MCH: 30.4 pg (ref 26.0–34.0)
MCHC: 32 g/dL (ref 30.0–36.0)
MCV: 95.2 fL (ref 78.0–100.0)
PLATELETS: 379 10*3/uL (ref 150–400)
RBC: 2.89 MIL/uL — AB (ref 3.87–5.11)
RDW: 15.6 % — ABNORMAL HIGH (ref 11.5–15.5)
WBC: 11.6 10*3/uL — ABNORMAL HIGH (ref 4.0–10.5)

## 2016-02-21 LAB — AEROBIC/ANAEROBIC CULTURE W GRAM STAIN (SURGICAL/DEEP WOUND)

## 2016-02-21 LAB — AEROBIC/ANAEROBIC CULTURE (SURGICAL/DEEP WOUND)

## 2016-02-21 MED ORDER — SODIUM CHLORIDE 0.9% FLUSH: 10.0000 mL | INTRAVENOUS | Status: DC | PRN

## 2016-02-21 NOTE — H&P (Cosign Needed)
Physical Medicine and Rehabilitation Admission H&P    Chief Complaint  Patient presents with  . Debility   HPI: Brandy Hardy is a 60 year old female with history of recurrent endometrial cancer s/p hysterectomy and XRT, morbid obesity,  recent UTI who was admitted on 02/14/16 with weakness, poor po intake, abdominal pain and near syncope. She was noted to be dehydrated and hypotensive due to septic shock. She was treated with fluid bolus, IV antibiotics  and levophed. CT abdomen showed areas of subcutaneous cellulitis with early abscess formation and air with inflammatory changes tracing into bilateral upper thigh musculature.  She developed right groin drainage due to abscess. 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 Kieth Brightly on 11/11. She continued to have copious malodorous drainage from wound and was taken back to OR on 11/15 for repeat I and D and was found to have urinary retention during placement of foley catheter.   Blood culture X 2 positive for beta lactamase positive Bacteriodes fragilis.  Wound culture with mixed flora. To continue bid dressing as will not be able to place VAC and continue IV antibiotics per CCS.  Dr. Baxter Flattery recommends weeks of IV antibiotic regiment with 11/12 as day one--antibiotic narrowed to Zosyn and Flucanozole.  Therapy evaluations done showing patient limited by generalized weakness and difficulty mobilizing due to pain and body habitus.  CIR recommended for follow up therapy.    Review of Systems  HENT: Negative for hearing loss and tinnitus.   Eyes: Negative for blurred vision and double vision.  Respiratory: Negative for cough and shortness of breath.   Cardiovascular: Positive for leg swelling. Negative for chest pain and palpitations.  Gastrointestinal: Negative for abdominal pain, constipation, heartburn and nausea.  Genitourinary: Negative for  dysuria and frequency.  Musculoskeletal: Negative for back pain, myalgias and neck pain.  Skin: Negative for itching and rash.  Neurological: Positive for weakness. Negative for dizziness, sensory change and focal weakness.  Psychiatric/Behavioral: Negative for memory loss. The patient has insomnia (due to bladder issues). The patient is not nervous/anxious.       Past Medical History:  Diagnosis Date  . 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 Endometrial cancer ([place])    with recurrance at introitus   . History of prediabetes   . 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 ABDOMINAL HYSTERECTOMY    . I&D EXTREMITY Right 02/20/2016   Procedure: IRRIGATION AND DEBRIDEMENT EXTREMITY RIGHT UPPER THIGH;  Surgeon: Fanny Skates, MD;  Location: [place];  Service: General;  Laterality: Right;  . IRRIGATION AND DEBRIDEMENT ABSCESS Right 02/16/2016   Procedure: IRRIGATION AND DEBRIDEMENT RIGHT GROIN ABSCESS, FULL THICKNESS DEBRIDMENT RIGHT GROIN INCLUDING > 40CM SQUARE.;  Surgeon: Arta Bruce Kinsinger, MD;  Location: [place];  Service: General;  Laterality: Right;  . IRRIGATION AND DEBRIDEMENT ABSCESS Right 02/20/2016   Procedure: IRRIGATION AND DEBRIDEMENT ABSCESS;  Surgeon: Fanny Skates, MD;  Location: [place];  Service: General;  Laterality: Right;  Perii-Vaginal Abscess    Family History  Problem Relation Age of Onset  . Hypertension Mother   . Heart disease Father 64    Expired  . Diabetes Neg Hx     Social History:  Wendie Chess husband and family at home who can assist.  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  She reports that she has never smoked. She has never used smokeless tobacco. She reports that she does not drink alcohol or use drugs.    Allergies: No Known Allergies    Medications Prior to Admission  Medication Sig Dispense Refill  . celecoxib (CELEBREX) 200 MG capsule Take  200 mg by mouth 2 (two) times daily.     Marland Kitchen HYDROcodone-acetaminophen (NORCO/VICODIN) 5-325 MG tablet Take 1 tablet by mouth every 4 (four) hours as needed for moderate pain.    . nitrofurantoin (MACRODANTIN) 100 MG capsule Take 100 mg by mouth 2 (two) times daily.    . Probiotic Product (PROBIOTIC DAILY PO) Take 1 tablet by mouth daily.       Home: Home Living Family/patient expects to be discharged to:: Private residence Living Arrangements: Spouse/significant other, Children, Other relatives (sister ) Available Help at Discharge: Family, Available 24 hours/day Type of Home: House Home Access: Stairs to enter [employer] of Steps: 4-5 Entrance Stairs-Rails: Right Home Layout: One level Bathroom Shower/Tub: Multimedia programmer: Handicapped height [place]: Environmental consultant - 2 wheels, Grab bars - tub/shower   Functional History: Prior Function Level of Independence: Independent with assistive device(s) Comments: Using RW for ambulation in the last few days PTA. Cooks, cleans.   Functional Status:  Mobility: Bed Mobility Overal bed mobility: Needs Assistance Bed Mobility: Rolling, Supine to Sit, Sit to Supine Rolling: Max assist Sidelying to sit: HOB elevated, Mod assist Supine to sit: Max assist, +2 for physical assistance Sit to supine: Max assist, +2 for physical assistance General bed mobility comments: With supine to sit, pt needed A for R LE and to get trunk upright.  With sit >supine, she needed A to get B LE up onto bed as well as trunk.  Then needed to get scooted to middle of bed.  Pt able to help pull self up in bed with rails with bed in Trendelenberg position. Transfers Overall transfer level: Needs assistance Equipment used: Rolling walker (2 wheeled) Transfers: Sit to/from Stand Sit to Stand: Mod assist, Min assist, +2 physical assistance Stand pivot transfers: Mod assist, +2 safety/equipment General transfer comment: MOD A of 2 from bed and  close to MIN A of 2 from recliner with increased time. Ambulation/Gait Ambulation/Gait assistance: Min assist, +2 safety/equipment Ambulation Distance (Feet): 6 Feet (plus 10') Assistive device: Rolling walker (2 wheeled) Gait Pattern/deviations: Decreased step length - right, Wide base of support, Antalgic, Trunk flexed General Gait Details: Pt ambulated 6' to sink did ADL with HR up to 170, however not a good EKG reading, and pt not showing signs of extreme fatigue or having any subjective reports of heart racing. Discovered one of her leads was off.  Lead placed back on and HR in low 100s. Then ambulated 10' back to bed with 3 standing rest breaks.  MAX HR was 147 with gait and pt was fatigued and wave form looked good. Gait velocity: decreased Gait velocity interpretation: Below normal speed for age/gender    ADL: ADL Overall ADL's : Needs assistance/impaired Eating/Feeding: Independent Grooming: Minimal assistance, Oral care, Standing, Wash/dry face, Sitting Grooming Details (indicate cue type and reason): Min assist for standing balance. Pt with bil forearms resting on edge of sink throughout task. Upper Body Bathing: Moderate assistance, Bed level Lower Body Bathing: Total assistance, Bed level Upper Body Dressing : Minimal assistance, Sitting Upper Body Dressing Details (indicate cue type and reason): to don/doff hospital gown sitting EOB  Lower Body Dressing: Total assistance Lower Body Dressing Details (indicate cue type and reason): Total assist to readjust socks in sitting Toilet Transfer: Moderate assistance, +2 for physical assistance, Ambulation, BSC, RW Toilet Transfer Details (indicate cue type and reason): Simulated by sit to stand from EOB with functional mobility in room. Toileting- Clothing Manipulation and Hygiene: Total assistance, Bed level Functional mobility during ADLs: Minimal assistance, +2 for safety/equipment General ADL Comments: Pt very deconditioned and  quick to fatigue. Pt with SOB after a few steps requiring multiple rest breaks with mobility in room. HR up to 146 with activity; returned to low 90s with seated rest break.  Cognition: Cognition Overall Cognitive Status: Within Functional Limits for tasks assessed Orientation Level: Oriented X4 Cognition Arousal/Alertness: Awake/alert Behavior During Therapy: WFL for tasks assessed/performed Overall Cognitive Status: Within Functional Limits for tasks assessed Memory: Decreased short-term memory (pt reported, "my mind isnt what it used to be.")   Blood pressure (!) 100/43, pulse 80, temperature 98.6 F (37 C), temperature source Oral, resp. rate (!) 29, height 5' 3" (1.6 m), weight 135 kg (297 lb 9.6 oz), SpO2 93 %. Physical Exam  Nursing note and vitals reviewed. Constitutional: She is oriented to person, place, and time. She appears well-developed and well-nourished.  Obese female in no distress  HENT:  Head: Normocephalic and atraumatic.  Mouth/Throat: Oropharynx is clear and moist.  Eyes: Conjunctivae and EOM are normal. Pupils are equal, round, and reactive to light.  Neck: Normal range of motion. Neck supple. No tracheal deviation present. No thyromegaly present.  Cardiovascular: Normal rate and regular rhythm.  Exam reveals no gallop and no friction rub.   No murmur heard. Respiratory: Effort normal and breath sounds normal. No stridor. No respiratory distress. She has no wheezes.  GI: Soft. Bowel sounds are normal. She exhibits no distension. There is no tenderness.  Musculoskeletal: Normal range of motion. She exhibits edema.  Neurological: She is alert and oriented to person, place, and time.  Skin: Skin is warm and dry.  Bulky dressing on lower abdomen and upper thigh. Inguinal areas dry with powder in place. Granulation and adipose tissue in open wounds. Areas are tender with palpation/manipulation  Psychiatric: She has a normal mood and affect. Her behavior is normal.  Judgment and thought content normal.    Results for orders placed or performed during the hospital encounter of 02/14/16 (from the past 48 hour(s))  CBC     Status: Abnormal   Collection Time: 02/20/16 10:07 PM  Result Value Ref Range   WBC 10.2 4.0 - 10.5 K/uL   RBC 2.61 (L) 3.87 - 5.11 MIL/uL   Hemoglobin 8.0 (L) 12.0 - 15.0 g/dL   HCT 24.8 (L) 36.0 - 46.0 %   MCV 95.0 78.0 - 100.0 fL   MCH 30.7 26.0 - 34.0 pg   MCHC 32.3 30.0 - 36.0 g/dL   RDW 15.4 11.5 - 15.5 %   Platelets 324 150 - 400 K/uL  Basic metabolic panel     Status: Abnormal   Collection Time: 02/20/16 10:07 PM  Result Value Ref Range   Sodium 139 135 - 145 mmol/L   Potassium 3.8 3.5 - 5.1 mmol/L   Chloride 104 101 - 111 mmol/L   CO2 28 22 - 32 mmol/L   Glucose, Bld 132 (H) 65 - 99 mg/dL   BUN 6 6 - 20 mg/dL   Creatinine, Ser 0.69 0.44 - 1.00 mg/dL   Calcium 7.6 (L) 8.9 - 10.3 mg/dL  GFR calc non Af Amer >60 >60 mL/min   GFR calc Af Amer >60 >60 mL/min    Comment: (NOTE) The eGFR has been calculated using the CKD EPI equation. This calculation has not been validated in all clinical situations. eGFR's persistently <60 mL/min signify possible Chronic Kidney Disease.    Anion gap 7 5 - 15  CBC     Status: Abnormal   Collection Time: 02/21/16  7:10 AM  Result Value Ref Range   WBC 11.6 (H) 4.0 - 10.5 K/uL   RBC 2.89 (L) 3.87 - 5.11 MIL/uL   Hemoglobin 8.8 (L) 12.0 - 15.0 g/dL   HCT 27.5 (L) 36.0 - 46.0 %   MCV 95.2 78.0 - 100.0 fL   MCH 30.4 26.0 - 34.0 pg   MCHC 32.0 30.0 - 36.0 g/dL   RDW 15.6 (H) 11.5 - 15.5 %   Platelets 379 150 - 400 K/uL  CBC     Status: Abnormal   Collection Time: 02/22/16  6:35 AM  Result Value Ref Range   WBC 10.1 4.0 - 10.5 K/uL   RBC 2.74 (L) 3.87 - 5.11 MIL/uL   Hemoglobin 8.5 (L) 12.0 - 15.0 g/dL   HCT 25.9 (L) 36.0 - 46.0 %   MCV 94.5 78.0 - 100.0 fL   MCH 31.0 26.0 - 34.0 pg   MCHC 32.8 30.0 - 36.0 g/dL   RDW 15.9 (H) 11.5 - 15.5 %   Platelets 340 150 - 400 K/uL    Sedimentation rate     Status: Abnormal   Collection Time: 02/22/16 12:55 PM  Result Value Ref Range   Sed Rate 104 (H) 0 - 22 mm/hr  C-reactive protein     Status: Abnormal   Collection Time: 02/22/16 12:55 PM  Result Value Ref Range   CRP 6.5 (H) <1.0 mg/dL   No results found.     Medical Problem List and Plan: 1.  Functional and mobility deficits secondary to inguinal/thigh abscess and sepsis with subsequent debility  -admit to inpatient rehab 2.  DVT Prophylaxis/Anticoagulation: Pharmaceutical: Lovenox 3. Pain Management:  Premedicate with oxycodone prior to dressing changes  4. Mood: Motivated to put best effort and get home. LCSW to follow for evaluation and support.  5. Neuropsych: This patient is capable of making decisions on her own behalf. 6. Skin/Wound Care:  Wet to dry dressing changes bid. Husband in process of learning wound care.--continue to educate other family members.  Will likely need to keep foley in to avoid contamination of wound and to help wound heal. Nursing reports needing to change dressing 4-5 times day before foley placed.   7. Fluids/Electrolytes/Nutrition: Monitor I/O. Check lytes in am.  8. Necrotizing fasciitis with right groin abscess s/p I and D: To continue wet to dry dressing changed to right mons/thigh wound. Daily showers recommended. On IV Zosyn/Diflucan D # 17/21  9. Morbid obesity: Encourage appropriate diet and weight loss. Also discussed increasing activity level as patient sedentary. She reports that she has lost 50 lbs in the past 2 years and off BP/DM meds.  Will consult dietician to review weight loss diet.  10. Reactive leucocytosis: Monitor for trend and temp curve.  11.  Stress induced hyperglycemia: Hgb A1c- 5.6 (10/2015).  Add CM restrictions to diet.   12.  Critical illness anemia:  11.5-->8.8. Monitor for now.  13. 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 Finally able to sleep at nights (used  to get up every 1-2 hours for  almost a year)    Post Admission Physician Evaluation: 1. Functional deficits secondary  to debility after sepsis. 2. Patient is admitted to receive collaborative, interdisciplinary care between the physiatrist, rehab nursing staff, and therapy team. 3. Patient's level of medical complexity and substantial therapy needs in context of that medical necessity cannot be provided at a lesser intensity of care such as a SNF. 4. Patient has experienced substantial functional loss from his/her baseline which was documented above under the "Functional History" and "Functional Status" headings.  Judging by the patient's diagnosis, physical exam, and functional history, the patient has potential for functional progress which will result in measurable gains while on inpatient rehab.  These gains will be of substantial and practical use upon discharge  in facilitating mobility and self-care at the household level. 5. Physiatrist will provide 24 hour management of medical needs as well as oversight of the therapy plan/treatment and provide guidance as appropriate regarding the interaction of the two. 6. The Preadmission Screening has been reviewed and patient status is unchanged unless otherwise stated above. 7. 24 hour rehab nursing will assist with bladder management, bowel management, safety, skin/wound care, disease management, medication administration, pain management and patient education  and help integrate therapy concepts, techniques,education, etc. 8. PT will assess and treat for/with: Lower extremity strength, range of motion, stamina, balance, functional mobility, safety, adaptive techniques and equipment, pain mgt, wound care, family education.   Goals are: mod I. 9. OT will assess and treat for/with: ADL's, functional mobility, safety, upper extremity strength, adaptive techniques and equipment, wound care/mgt, ego support, family education.   Goals are: mod I to  supervision. Therapy may not  proceed with showering this patient. 10. SLP will assess and treat for/with: n/a.  Goals are: n/a. 11. Case Management and Social Worker will assess and treat for psychological issues and discharge planning. 12. Team conference will be held weekly to assess progress toward goals and to determine barriers to discharge. 13. Patient will receive at least 3 hours of therapy per day at least 5 days per week. 14. ELOS: 10-15 days       15. Prognosis:  excellent     Meredith Staggers, MD, [facility] 02/22/2016  02/22/2016

## 2016-02-21 NOTE — Progress Notes (Signed)
Inpatient Rehabilitation  I have insurance authorization for IP Rehab and have a bed available to offer.  I await medical clearance prior to admission.  Plan to follow up with the team when I know.  Please call with questions.    Carmelia Roller., CCC/SLP Admission Coordinator  [place]  Cell [phone]

## 2016-02-21 NOTE — Progress Notes (Signed)
Patient ID: Brandy Hardy, female   DOB: [birth year], 60 y.o.   MRN: [id number]  [facility] Surgery Progress Note  1 Day Post-Op  Subjective: Increased pain at wound sites since surgery, dressing changes painful. No fevers over night. She does also continue to have a suppressed appetite.  Objective: Vital signs in last 24 hours: Temp:  [97.9 F (36.6 C)-98.5 F (36.9 C)] 98.5 F (36.9 C) (11/15 2220) Pulse Rate:  [65-78] 78 (11/15 2220) Resp:  [17-27] 23 (11/15 2220) BP: (111-152)/(49-94) 111/52 (11/15 2220) SpO2:  [97 %-100 %] 97 % (11/15 2220) Last BM Date: 02/17/16  Intake/Output from previous day: 11/15 0701 - 11/16 0700 In: 6615 [P.O.:240; I.V.:5975; IV Piggyback:400] Out: 2550 [Urine:2500; Blood:50] Intake/Output this shift: No intake/output data recorded.  PE: Gen: Alert, NAD, pleasant Pulm: Effort normal Abd: obese,Soft, NT/ND Skin: right sided mons pubis wound and proximal right thigh wound both look good, some clear drainage but no purulent drainage or foul odor. Mild surrounding erythema proximal right thigh  Lab Results:   Recent Labs  02/20/16 2207 02/21/16 0710  WBC 10.2 11.6*  HGB 8.0* 8.8*  HCT 24.8* 27.5*  PLT 324 379   BMET  Recent Labs  02/19/16 0410 02/20/16 2207  NA 138 139  K 4.4 3.8  CL 104 104  CO2 27 28  GLUCOSE 103* 132*  BUN 7 6  CREATININE 0.62 0.69  CALCIUM 7.3* 7.6*   PT/INR No results for input(s): LABPROT, INR in the last 72 hours. CMP     Component Value Date/Time   NA 139 02/20/2016 2207   K 3.8 02/20/2016 2207   CL 104 02/20/2016 2207   CO2 28 02/20/2016 2207   GLUCOSE 132 (H) 02/20/2016 2207   BUN 6 02/20/2016 2207   CREATININE 0.69 02/20/2016 2207   CALCIUM 7.6 (L) 02/20/2016 2207   PROT 4.0 (L) 02/18/2016 1109   ALBUMIN 1.0 (L) 02/18/2016 1109   AST 43 (H) 02/18/2016 1109   ALT 33 02/18/2016 1109   ALKPHOS 86 02/18/2016 1109   BILITOT 0.6 02/18/2016 1109   GFRNONAA >60 02/20/2016 2207   GFRAA  >60 02/20/2016 2207   Lipase  No results found for: LIPASE     Studies/Results: No results found.  Anti-infectives: Anti-infectives    Start     Dose/Rate Route Frequency Ordered Stop   02/20/16 1930  vancomycin (VANCOCIN) 1,250 mg in sodium chloride 0.9 % 250 mL IVPB     1,250 mg 166.7 mL/hr over 90 Minutes Intravenous Every 12 hours 02/20/16 1924     02/18/16 0800  fluconazole (DIFLUCAN) tablet 200 mg     200 mg Oral Daily 02/17/16 1159     02/16/16 1800  vancomycin (VANCOCIN) 1,250 mg in sodium chloride 0.9 % 250 mL IVPB  Status:  Discontinued     1,250 mg 166.7 mL/hr over 90 Minutes Intravenous Every 12 hours 02/16/16 1624 02/19/16 1407   02/14/16 1000  vancomycin (VANCOCIN) IVPB 750 mg/150 ml premix  Status:  Discontinued     750 mg 150 mL/hr over 60 Minutes Intravenous Every 12 hours 02/14/16 0351 02/15/16 1057   02/14/16 1000  piperacillin-tazobactam (ZOSYN) IVPB 3.375 g     3.375 g 12.5 mL/hr over 240 Minutes Intravenous Every 8 hours 02/14/16 0351     02/14/16 0600  fluconazole (DIFLUCAN) IVPB 200 mg  Status:  Discontinued     200 mg 100 mL/hr over 60 Minutes Intravenous Every 24 hours 02/14/16 0534 02/17/16 1159   02/14/16 0300  piperacillin-tazobactam (ZOSYN) IVPB 3.375 g     3.375 g 100 mL/hr over 30 Minutes Intravenous  Once 02/14/16 0246 02/14/16 0313   02/14/16 0300  vancomycin (VANCOCIN) IVPB 1000 mg/200 mL premix     1,000 mg 200 mL/hr over 60 Minutes Intravenous  Once 02/14/16 0246 02/14/16 0404   02/14/16 0245  cefTRIAXone (ROCEPHIN) 2 g in dextrose 5 % 50 mL IVPB  Status:  Discontinued     2 g 100 mL/hr over 30 Minutes Intravenous  Once 02/14/16 0231 02/14/16 0245       Assessment/Plan 1. S/p incision and drainage of right thigh abscess, full thickness debridement of 40cm^2 right groin for necrotizing fasciitis11/11 Dr. Sheliah Hatch 2. Debridement of skin subcutaneous tissue and fascia right groin wound; Debridement to be monitored skin subcutaneous  tissue of right thigh wound 11/15 Dr. Derrell Lolling - 11/11 culture: MIXED ANAEROBIC FLORA PRESENT - WBC 11.6, afebrile  ID - zosyn 11/9>>, vanc 11/9>>, diflucan 11/9>> FEN - heart healthy VTE - heparin  Plan - Continue BID wet to dry packing/dressing changes, will not be able to place wound vac. Continue antibiotics (day 8). Encourage patient to get up in chair/out of bed, and daily showers. Continue PT   LOS: 7 days    Edson Snowball , [facility] Surgery 02/21/2016, 7:56 AM Pager: [phone] Consults: [phone] Mon-Fri 7:00 am-4:30 pm Sat-Sun 7:00 am-11:30 am

## 2016-02-21 NOTE — Anesthesia Postprocedure Evaluation (Signed)
Anesthesia Post Note  Patient: Jakila Wenzell  Procedure(s) Performed: Procedure(s) (LRB): IRRIGATION AND DEBRIDEMENT EXTREMITY RIGHT UPPER THIGH (Right) IRRIGATION AND DEBRIDEMENT ABSCESS (Right)  Patient location during evaluation: PACU Anesthesia Type: General Level of consciousness: awake Pain management: pain level controlled Vital Signs Assessment: post-procedure vital signs reviewed and stable Respiratory status: spontaneous breathing Cardiovascular status: stable Postop Assessment: no signs of nausea or vomiting Anesthetic complications: no    Last Vitals:  Vitals:   02/20/16 1845 02/20/16 2220  BP: (!) 121/56 (!) 111/52  Pulse: 66 78  Resp: (!) 22 (!) 23  Temp: 36.6 C 36.9 C    Last Pain:  Vitals:   02/20/16 2220  TempSrc: Oral  PainSc:                  Treyshaun Keatts

## 2016-02-21 NOTE — Progress Notes (Signed)
Patient ID: Brandy Hardy, female   DOB: 12/27/1955, 60 y.o.   MRN: [id number]  Discussed case with Dr. Dalbert Batman. He believes that she will be stable and ready for transfer to CIR tomorrow. Upon transfer she will need to continue BID wet to dry dressing changes as well as IV antibiotics.  BROOKE A MILLER

## 2016-02-21 NOTE — Progress Notes (Signed)
Physical Therapy Treatment Patient Details Name: Brandy Hardy MRN: [id number] DOB: Jan 02, 1956 Today's Date: 02/21/2016    History of Present Illness Patient is a 60 y/o female with hx of cervical cancer (radiation 2016),presents from home with weakness, falls, dysuria, suprapubic pain, and urinary frequency for past 5 days. Found to be febrile, tachycardic, and hypotensive secondary to sepsis from UTI.  Noted to have right groin abscess s/p I&D for necrotizing fasciitis.     PT Comments    Patient progressing slowly towards PT goals. Tolerated gait training today with Min A for balance/safety. Pain seems improved but pt still demonstrates weakness and difficulty mobilizing due to body habitus. Motivated to improve. Encouraged sitting up in chair more frequently. Will continue to follow.   Follow Up Recommendations  CIR     Equipment Recommendations  Other (comment) (TBA)    Recommendations for Other Services       Precautions / Restrictions Precautions Precautions: Fall Restrictions Weight Bearing Restrictions: No    Mobility  Bed Mobility Overal bed mobility: Needs Assistance Bed Mobility: Rolling;Sidelying to Sit Rolling: Mod assist Sidelying to sit: HOB elevated;Mod assist       General bed mobility comments: Assist for RLE and to elevate trunk. Cues for technique. Difficulty sitting EOB due to rail digging into RLE and legs too short.  Transfers Overall transfer level: Needs assistance Equipment used: Rolling walker (2 wheeled) Transfers: Sit to/from Stand Sit to Stand: Mod assist;+2 physical assistance;Max assist Stand pivot transfers: Mod assist;+2 safety/equipment       General transfer comment: Assist of 2 to stand from EOB and from chair with cues for momentum and hand placement. SPT bed to chair with assist for balance and RW management.  Ambulation/Gait Ambulation/Gait assistance: Min assist Ambulation Distance (Feet): 12 Feet Assistive device:  Rolling walker (2 wheeled) Gait Pattern/deviations: Step-to pattern;Decreased stride length;Wide base of support;Trunk flexed Gait velocity: decreased Gait velocity interpretation: Below normal speed for age/gender General Gait Details: Slow, waddling like gait pattern with decreased step length bilaterally with multiple standing rest breaks resting right forearm on walker.    Stairs            Wheelchair Mobility    Modified Rankin (Stroke Patients Only)       Balance Overall balance assessment: Needs assistance Sitting-balance support: Feet supported;No upper extremity supported Sitting balance-Leahy Scale: Fair Sitting balance - Comments: Requires UE support sitting EOB.   Standing balance support: During functional activity Standing balance-Leahy Scale: Poor                      Cognition Arousal/Alertness: Awake/alert Behavior During Therapy: WFL for tasks assessed/performed Overall Cognitive Status: Within Functional Limits for tasks assessed                      Exercises      General Comments        Pertinent Vitals/Pain Pain Assessment: Faces Faces Pain Scale: Hurts little more Pain Location: abdomen Pain Descriptors / Indicators: Sore;Grimacing Pain Intervention(s): Monitored during session;Repositioned    Home Living                      Prior Function            PT Goals (current goals can now be found in the care plan section) Progress towards PT goals: Progressing toward goals    Frequency    Min 3X/week      PT  Plan Current plan remains appropriate    Co-evaluation             End of Session Equipment Utilized During Treatment: Gait belt Activity Tolerance: Patient tolerated treatment well Patient left: in chair;with call bell/phone within reach     Time: 1144-1215 PT Time Calculation (min) (ACUTE ONLY): 31 min  Charges:  $Gait Training: 8-22 mins $Therapeutic Activity: 8-22 mins                     G Codes:      Amit Meloy A Brainard [place]fill 02/21/2016, 12:39 PM Mylo Red, PT, DPT [phone]

## 2016-02-21 NOTE — Discharge Instructions (Signed)
WOUND CARE: - dressing to be changed twice daily - supplies: sterile saline, kerlix, scissors, ABD pads, tape  - remove dressing and all packing carefully, moistening with sterile saline as needed to avoid packing/internal dressing sticking to the wound. - clean edges of skin around the wound with water/gauze, making sure there is no tape debris or leakage left on skin that could cause skin irritation or breakdown. - dampen and clean kerlix with sterile saline and pack wound from wound base to skin level, making sure to take note of any possible areas of wound tracking, tunneling and packing appropriately. Wound can be packed loosely. Trim kerlix to size if a whole kerlix is not required. - cover wound with a dry ABD pad and secure with tape.  - write the date/time on the dry dressing/tape to better track when the last dressing change occurred. - apply any skin protectant/powder recommended by clinician to protect skin/skin folds. - change dressing as needed if leakage occurs, wound gets contaminated, or patient requests to shower. - patient may shower daily with wound open and following the shower the wound should be dried and a clean dressing placed.   

## 2016-02-21 NOTE — Progress Notes (Addendum)
Brandy Hardy  NWG:[id number] DOB: 26-Mar-1956 DOA: 02/14/2016 PCP: Lupe Carney, MD    Brief Narrative:  60 y.o. female who presented to ED for weakness s/p fall without LOC or head trauma. Was experiencing dysuria, suprapubic pain, and urinary frequency for 5 days. Was evaluated at Urgent Care facility and diagnosed with UTI. Was given Rx for Norco, Nitrofurantoin, and Azo. She had taken 3 days of antibiotics. A urine culture was sent but patient is unaware of the results. After starting these medications, dysuria improved but her other symptoms did not.   In the ED, patient was found to be febrile, tachycardic, and hypotensive. Labs were significant for K 2.4, leukocytosis of 20.9, and lactic acid of 4.7. UA appeared consistent with an infectious process.  Code sepsis was called. Patient was given NS 4L bolus without improvement in blood pressure. Therefore, Levophed was initiated. Broad spectrum antibiotics were initiated.  Status post incision and drainage of right thigh abscess, full thickness debridement of 40cm^2 right groin for necrotizing fasciitis 11/11.  Patient underwent repeat I&D on 11/15.  Significant Events: 11/9 Admit for septic shock 11/10 [facility] assumed care    Subjective: Patient seen this morning while surgical team and patient's RN were performing dressing changes. Postop wounds looked good. Patient was an appropriate pain. She denied any other complaints.  Assessment & Plan: Septic Shock due to necrotizing fasciitis levophed now off - urine culture <10,000 colonies - continue broad-spectrum empiric coverage Follow culture from incision and drainage on 11/11 > mixed anaerobic flora Blood culture 1 from 02/14/16: Shows Bacteroides fragilis? Significance. Discussed with infectious disease (Dr. Drue Second) we will provide input on 11/17 Re: Possible bacteremia related to necrotizing fasciitis, choice and duration of antibiotics.  Mons pubis and right thigh necrotizing  fasciitis Continue broad-spectrum antibiotics -added vancomycin to Zosyn and fluconazole on 11/11. Gen. surgery consulted.  Status post incision and drainage of right thigh abscess, full thickness debridement of 40cm^2 right groin for necrotizing fasciitis 11/11. Gram stain shows abundant gram-positive cocci in pairs and gram-negative rods. Appreciate surgery input.  Continue twice a day daily packing changes. Consulted wound care nurse for recommendations.  On 02/20/16, patient underwent repeat I&D in the OR. As per surgical team > she should be stable for discharge to CIR tomorrow. Upon transfer she will need to continue twice a day wet to dry dressing changes as well as IV antibiotics (duration per surgery).  Foley catheter was placed intraoperatively and decision to be made at discharge whether to continue due to consideration for urinary swelling of postop wound site or to discontinue.   AKI  Due to above (bl Cr 0.6)  - Resolved  Severe protein calorie malnutrition-albumin 1.1, nutrition therapy following  Mildly elevated troponin  Follow to [facility] - no sx at present to suggest USAP/denies CP 2-D echo  showed EF of 65-70%, wall motion was normal, no regional wall motion abnormalities,  Hypokalemia   Replaced. Mg is normal   Candidal intertrigo -patient on fluconazole   Morbid obesity Body mass index is 52.84 kg/m.   Anemia -Stable.  History of cervical cancer status post radiation  Essential hypertension - Not on medications prior to admission. Reasonable inpatient control.   DVT prophylaxis: SQ heparin  Code Status: FULL CODE Family Communication: Discussed in detail with patient's spouse at bedside.  Disposition Plan:  Possible discharge to CIR 11/17.  Consultants:  PCCM General surgery Rehabilitation M.D.    Antimicrobials:  Vancomycin 11/8 > 11/10, 11/11> Zosyn 11/8 >  Objective:  Vitals:   02/20/16 1815 02/20/16 1830 02/20/16 1845 02/20/16 2220  BP: (!)  128/58 (!) 116/57 (!) 121/56 (!) 111/52  Pulse: 68 71 66 78  Resp: (!) 26 (!) 27 (!) 22 (!) 23  Temp:   97.9 F (36.6 C) 98.5 F (36.9 C)  TempSrc:    Oral  SpO2: 100% 100% 100% 97%  Weight:      Height:         Intake/Output Summary (Last 24 hours) at 02/21/16 1642 Last data filed at 02/21/16 0700  Gross per 24 hour  Intake             6015 ml  Output             2050 ml  Net             3965 ml   Filed Weights   02/18/16 0511 02/19/16 0500 02/20/16 0500  Weight: 133.5 kg (294 lb 6.4 oz) (!) 136.4 kg (300 lb 9.6 oz) 135.3 kg (298 lb 4.8 oz)    Examination: General: No acute respiratory distress. In some painful distress as patient's RN and the surgical team are performing dressing changes. Lungs: Clear to auscultation bilaterally without wheezes or crackles Cardiovascular: Regular rate and rhythm without murmur gallop or rub normal S1 and S2. Telemetry: Sinus rhythm. Abdomen: Nontender, nondistended, soft, bowel sounds positive, no rebound, no ascites, no appreciable mass. Surgical wounds in the right groin and the right upper medial thigh a clean and are being packed with dressing. Extremities: No significant cyanosis, clubbing, or edema bilateral lower extremities  CBC:  Recent Labs Lab 02/18/16 1109 02/19/16 0410 02/20/16 0515 02/20/16 2207 02/21/16 0710  WBC 15.8* 13.4* 11.9* 10.2 11.6*  HGB 8.7* 8.5* 8.6* 8.0* 8.8*  HCT 27.3* 26.4* 27.3* 24.8* 27.5*  MCV 93.5 94.3 94.1 95.0 95.2  PLT 283 280 312 324 379   Basic Metabolic Panel:  Recent Labs Lab 02/15/16 0440 02/16/16 0510 02/17/16 0500 02/18/16 1109 02/19/16 0410 02/20/16 2207  NA 134* 139 138 138 138 139  K 2.9* 3.2* 3.2* 3.7 4.4 3.8  CL 100* 104 103 103 104 104  CO2 25 27 28 29 27 28   GLUCOSE 120* 108* 108* 123* 103* 132*  BUN 22* 16 7 7 7 6   CREATININE 1.32* 0.80 0.60 0.59 0.62 0.69  CALCIUM 6.8* 7.1* 7.2* 7.1* 7.3* 7.6*  MG 1.9  --   --  1.8  --   --   PHOS 2.1*  --   --   --   --   --     GFR: Estimated Creatinine Clearance: 101.1 mL/min (by C-G formula based on SCr of 0.69 mg/dL).  Liver Function Tests:  Recent Labs Lab 02/17/16 0500 02/18/16 1109  AST 59* 43*  ALT 43 33  ALKPHOS 93 86  BILITOT 1.0 0.6  PROT 4.3* 4.0*  ALBUMIN 1.1* 1.0*   Cardiac Enzymes:  Recent Labs Lab 02/14/16 1715 02/15/16 1445 02/16/16 0510  TROPONINI 0.11* 0.70* 0.05*    HbA1C: Hgb A1c MFr Bld  Date/Time Value Ref Range Status  10/08/2015 10:36 AM 5.6 4.6 - 6.5 % Final    Comment:    Glycemic Control Guidelines for People with Diabetes:Non Diabetic:  <6%Goal of Therapy: <7%Additional Action Suggested:  >8%   02/27/2015 08:19 AM 5.7 4.6 - 6.5 % Final    Comment:    Glycemic Control Guidelines for People with Diabetes:Non Diabetic:  <6%Goal of Therapy: <7%Additional Action Suggested:  >8%  Recent Results (from the past 240 hour(s))  Urine culture     Status: Abnormal   Collection Time: 02/14/16  2:28 AM  Result Value Ref Range Status   Specimen Description URINE, RANDOM  Final   Special Requests NONE  Final   Culture <10,000 COLONIES/mL INSIGNIFICANT GROWTH (A)  Final   Report Status 02/15/2016 FINAL  Final  Blood Culture (routine x 2)     Status: Abnormal   Collection Time: 02/14/16  2:40 AM  Result Value Ref Range Status   Specimen Description BLOOD LEFT ARM  Final   Special Requests BOTTLES DRAWN AEROBIC AND ANAEROBIC  Final   Culture  Setup Time   Final    GRAM NEGATIVE RODS ANAEROBIC BOTTLE ONLY CRITICAL RESULT CALLED TO, READ BACK BY AND VERIFIED WITH: Hollie Beach AT 1610 02/17/16 BY L BENFIELD    Culture BACTEROIDES FRAGILIS BETA LACTAMASE POSITIVE  (A)  Final   Report Status 02/20/2016 FINAL  Final  Blood Culture ID Panel (Reflexed)     Status: None   Collection Time: 02/14/16  2:40 AM  Result Value Ref Range Status   Enterococcus species NOT DETECTED NOT DETECTED Final   Listeria monocytogenes NOT DETECTED NOT DETECTED Final   Staphylococcus  species NOT DETECTED NOT DETECTED Final   Staphylococcus aureus NOT DETECTED NOT DETECTED Final   Streptococcus species NOT DETECTED NOT DETECTED Final   Streptococcus agalactiae NOT DETECTED NOT DETECTED Final   Streptococcus pneumoniae NOT DETECTED NOT DETECTED Final   Streptococcus pyogenes NOT DETECTED NOT DETECTED Final   Acinetobacter baumannii NOT DETECTED NOT DETECTED Final   Enterobacteriaceae species NOT DETECTED NOT DETECTED Final   Enterobacter cloacae complex NOT DETECTED NOT DETECTED Final   Escherichia coli NOT DETECTED NOT DETECTED Final   Klebsiella oxytoca NOT DETECTED NOT DETECTED Final   Klebsiella pneumoniae NOT DETECTED NOT DETECTED Final   Proteus species NOT DETECTED NOT DETECTED Final   Serratia marcescens NOT DETECTED NOT DETECTED Final   Haemophilus influenzae NOT DETECTED NOT DETECTED Final   Neisseria meningitidis NOT DETECTED NOT DETECTED Final   Pseudomonas aeruginosa NOT DETECTED NOT DETECTED Final   Candida albicans NOT DETECTED NOT DETECTED Final   Candida glabrata NOT DETECTED NOT DETECTED Final   Candida krusei NOT DETECTED NOT DETECTED Final   Candida parapsilosis NOT DETECTED NOT DETECTED Final   Candida tropicalis NOT DETECTED NOT DETECTED Final  MRSA PCR Screening     Status: None   Collection Time: 02/14/16  6:04 AM  Result Value Ref Range Status   MRSA by PCR NEGATIVE NEGATIVE Final    Comment:        The GeneXpert MRSA Assay (FDA approved for NASAL specimens only), is one component of a comprehensive MRSA colonization surveillance program. It is not intended to diagnose MRSA infection nor to guide or monitor treatment for MRSA infections.   Surgical pcr screen     Status: None   Collection Time: 02/16/16  5:00 PM  Result Value Ref Range Status   MRSA, PCR NEGATIVE NEGATIVE Final   Staphylococcus aureus NEGATIVE NEGATIVE Final    Comment:        The Xpert SA Assay (FDA approved for NASAL specimens in patients over 21 years of  age), is one component of a comprehensive surveillance program.  Test performance has been validated by [facility] for patients greater than or equal to 49 year old. It is not intended to diagnose infection nor to guide or monitor treatment.  Aerobic/Anaerobic Culture (surgical/deep wound)     Status: None   Collection Time: 02/16/16  7:52 PM  Result Value Ref Range Status   Specimen Description ABSCESS GROIN  Final   Special Requests NONE  Final   Gram Stain   Final    ABUNDANT WBC PRESENT, PREDOMINANTLY PMN RARE GRAM POSITIVE COCCI IN PAIRS RARE GRAM NEGATIVE RODS    Culture   Final    MIXED ANAEROBIC FLORA PRESENT.  CALL LAB IF FURTHER IID REQUIRED.   Report Status 02/21/2016 FINAL  Final     Scheduled Meds: . atorvastatin  40 mg Oral Daily  . Chlorhexidine Gluconate Cloth  6 each Topical Once  . feeding supplement (ENSURE ENLIVE)  237 mL Oral TID WC  . fluconazole  200 mg Oral Daily  . heparin  5,000 Units Subcutaneous Q8H  . multivitamin with minerals  1 tablet Oral Daily  . pantoprazole  40 mg Oral Daily  . piperacillin-tazobactam (ZOSYN)  IV  3.375 g Intravenous Q8H  . senna  1 tablet Oral BID  . sodium chloride  500 mL Intravenous Once  . vancomycin  1,250 mg Intravenous Q12H   Continuous Infusions:    LOS: 7 days     Abbigal Radich, MD, FACP, FHM. [facility] Pager [phone]  If 7PM-7AM, please contact night-coverage [email or web address] Password [facility] 02/21/2016, 4:42 PM

## 2016-02-22 ENCOUNTER — Encounter (HOSPITAL_COMMUNITY): Payer: Self-pay | Admitting: *Deleted

## 2016-02-22 ENCOUNTER — Inpatient Hospital Stay (HOSPITAL_COMMUNITY)
Admission: RE | Admit: 2016-02-22 | Discharge: 2016-03-05 | DRG: 948 | Disposition: A | Discharge status: Home Health | Payer: Managed Care, Other (non HMO) | Source: Intra-hospital | Attending: Physical Medicine & Rehabilitation | Admitting: Physical Medicine & Rehabilitation

## 2016-02-22 DIAGNOSIS — Z9221 Personal history of antineoplastic chemotherapy: Secondary | ICD-10-CM

## 2016-02-22 DIAGNOSIS — R338 Other retention of urine: Secondary | ICD-10-CM

## 2016-02-22 DIAGNOSIS — R5381 Other malaise: Secondary | ICD-10-CM | POA: Diagnosis present

## 2016-02-22 DIAGNOSIS — L02214 Cutaneous abscess of groin: Secondary | ICD-10-CM | POA: Diagnosis not present

## 2016-02-22 DIAGNOSIS — Z6841 Body Mass Index (BMI) 40.0 and over, adult: Secondary | ICD-10-CM

## 2016-02-22 DIAGNOSIS — M726 Necrotizing fasciitis: Secondary | ICD-10-CM

## 2016-02-22 DIAGNOSIS — N342 Other urethritis: Secondary | ICD-10-CM | POA: Diagnosis present

## 2016-02-22 DIAGNOSIS — Z8542 Personal history of malignant neoplasm of other parts of uterus: Secondary | ICD-10-CM

## 2016-02-22 DIAGNOSIS — Z8541 Personal history of malignant neoplasm of cervix uteri: Secondary | ICD-10-CM | POA: Diagnosis not present

## 2016-02-22 DIAGNOSIS — Z90710 Acquired absence of both cervix and uterus: Secondary | ICD-10-CM

## 2016-02-22 DIAGNOSIS — D62 Acute posthemorrhagic anemia: Secondary | ICD-10-CM | POA: Diagnosis not present

## 2016-02-22 DIAGNOSIS — E1165 Type 2 diabetes mellitus with hyperglycemia: Secondary | ICD-10-CM | POA: Diagnosis present

## 2016-02-22 DIAGNOSIS — Z8249 Family history of ischemic heart disease and other diseases of the circulatory system: Secondary | ICD-10-CM

## 2016-02-22 DIAGNOSIS — Z923 Personal history of irradiation: Secondary | ICD-10-CM

## 2016-02-22 DIAGNOSIS — D6489 Other specified anemias: Secondary | ICD-10-CM | POA: Diagnosis present

## 2016-02-22 DIAGNOSIS — N340 Urethral abscess: Secondary | ICD-10-CM

## 2016-02-22 DIAGNOSIS — N390 Urinary tract infection, site not specified: Secondary | ICD-10-CM

## 2016-02-22 DIAGNOSIS — N3020 Other chronic cystitis without hematuria: Secondary | ICD-10-CM | POA: Diagnosis present

## 2016-02-22 DIAGNOSIS — Z833 Family history of diabetes mellitus: Secondary | ICD-10-CM

## 2016-02-22 DIAGNOSIS — R300 Dysuria: Secondary | ICD-10-CM

## 2016-02-22 DIAGNOSIS — R6520 Severe sepsis without septic shock: Secondary | ICD-10-CM

## 2016-02-22 DIAGNOSIS — R6521 Severe sepsis with septic shock: Secondary | ICD-10-CM

## 2016-02-22 DIAGNOSIS — Z79899 Other long term (current) drug therapy: Secondary | ICD-10-CM | POA: Diagnosis not present

## 2016-02-22 DIAGNOSIS — A419 Sepsis, unspecified organism: Secondary | ICD-10-CM | POA: Diagnosis present

## 2016-02-22 DIAGNOSIS — I10 Essential (primary) hypertension: Secondary | ICD-10-CM | POA: Diagnosis present

## 2016-02-22 DIAGNOSIS — Z960 Presence of urogenital implants: Secondary | ICD-10-CM

## 2016-02-22 DIAGNOSIS — B9689 Other specified bacterial agents as the cause of diseases classified elsewhere: Secondary | ICD-10-CM

## 2016-02-22 LAB — C-REACTIVE PROTEIN: CRP: 6.5 mg/dL — ABNORMAL HIGH (ref <1.0)

## 2016-02-22 LAB — CBC
HCT: 25.9 % — ABNORMAL LOW (ref 36.0–46.0)
HEMOGLOBIN: 8.5 g/dL — AB (ref 12.0–15.0)
MCH: 31 pg (ref 26.0–34.0)
MCHC: 32.8 g/dL (ref 30.0–36.0)
MCV: 94.5 fL (ref 78.0–100.0)
PLATELETS: 340 10*3/uL (ref 150–400)
RBC: 2.74 MIL/uL — AB (ref 3.87–5.11)
RDW: 15.9 % — ABNORMAL HIGH (ref 11.5–15.5)
WBC: 10.1 10*3/uL (ref 4.0–10.5)

## 2016-02-22 LAB — SEDIMENTATION RATE: SED RATE: 104 mm/h — AB (ref 0–22)

## 2016-02-22 MED ORDER — SENNA 8.6 MG PO TABS
1.0000 | ORAL_TABLET | Freq: Two times a day (BID) | ORAL | Status: DC
  Administered 2016-02-22 – 2016-03-05 (×21): 8.6 mg via ORAL
  Filled 2016-02-22 (×23): qty 1

## 2016-02-22 MED ORDER — SODIUM CHLORIDE 0.9% FLUSH
10.0000 mL | INTRAVENOUS | Status: DC | PRN
  Administered 2016-02-29: 10 mL
  Filled 2016-02-22: qty 40

## 2016-02-22 MED ORDER — OXYCODONE HCL 5 MG PO TABS: 5.0000 mg | ORAL_TABLET | Freq: Four times a day (QID) | ORAL | 0 refills | Status: DC | PRN

## 2016-02-22 MED ORDER — ONDANSETRON HCL 4 MG/2ML IJ SOLN: 4.0000 mg | Freq: Four times a day (QID) | INTRAMUSCULAR | Status: DC | PRN

## 2016-02-22 MED ORDER — PROCHLORPERAZINE 25 MG RE SUPP: 12.5000 mg | Freq: Four times a day (QID) | RECTAL | Status: DC | PRN

## 2016-02-22 MED ORDER — FLUCONAZOLE 200 MG PO TABS
200.0000 mg | ORAL_TABLET | Freq: Every day | ORAL | Status: DC
  Administered 2016-02-23 – 2016-03-05 (×12): 200 mg via ORAL
  Filled 2016-02-22 (×12): qty 1

## 2016-02-22 MED ORDER — ATORVASTATIN CALCIUM 40 MG PO TABS
40.0000 mg | ORAL_TABLET | Freq: Every day | ORAL | Status: DC
  Administered 2016-02-23 – 2016-02-25 (×3): 40 mg via ORAL
  Filled 2016-02-22 (×3): qty 1

## 2016-02-22 MED ORDER — SODIUM CHLORIDE 0.9% FLUSH
10.0000 mL | Freq: Two times a day (BID) | INTRAVENOUS | Status: DC
  Administered 2016-02-22 – 2016-03-05 (×5): 10 mL

## 2016-02-22 MED ORDER — PIPERACILLIN-TAZOBACTAM 3.375 G IVPB: 3.3750 g | Freq: Three times a day (TID) | INTRAVENOUS | Status: DC

## 2016-02-22 MED ORDER — BISACODYL 10 MG RE SUPP: 10.0000 mg | Freq: Every day | RECTAL | Status: DC | PRN

## 2016-02-22 MED ORDER — ENOXAPARIN SODIUM 40 MG/0.4ML ~~LOC~~ SOLN
40.0000 mg | SUBCUTANEOUS | Status: DC
  Administered 2016-02-22 – 2016-03-04 (×12): 40 mg via SUBCUTANEOUS
  Filled 2016-02-22 (×12): qty 0.4

## 2016-02-22 MED ORDER — TRAZODONE HCL 50 MG PO TABS: 25.0000 mg | ORAL_TABLET | Freq: Every evening | ORAL | Status: DC | PRN

## 2016-02-22 MED ORDER — DIPHENHYDRAMINE HCL 12.5 MG/5ML PO ELIX
12.5000 mg | ORAL_SOLUTION | Freq: Four times a day (QID) | ORAL | Status: DC | PRN
Start: 2016-02-22 — End: 2016-03-05

## 2016-02-22 MED ORDER — PIPERACILLIN-TAZOBACTAM 3.375 G IVPB
3.3750 g | Freq: Three times a day (TID) | INTRAVENOUS | Status: DC
  Administered 2016-02-22 – 2016-02-27 (×14): 3.375 g via INTRAVENOUS
  Filled 2016-02-22 (×18): qty 50

## 2016-02-22 MED ORDER — SODIUM CHLORIDE 0.9% FLUSH
10.0000 mL | INTRAVENOUS | Status: DC | PRN
  Administered 2016-02-23 – 2016-02-25 (×2): 10 mL
  Administered 2016-02-27: 20 mL
  Administered 2016-03-02 – 2016-03-05 (×3): 10 mL
  Filled 2016-02-22 (×6): qty 40

## 2016-02-22 MED ORDER — PROCHLORPERAZINE MALEATE 5 MG PO TABS: 5.0000 mg | ORAL_TABLET | Freq: Four times a day (QID) | ORAL | Status: DC | PRN

## 2016-02-22 MED ORDER — METHOCARBAMOL 500 MG PO TABS: 500.0000 mg | ORAL_TABLET | Freq: Three times a day (TID) | ORAL | Status: DC | PRN

## 2016-02-22 MED ORDER — FLUCONAZOLE 200 MG PO TABS: 200.0000 mg | ORAL_TABLET | Freq: Every day | ORAL | Status: DC

## 2016-02-22 MED ORDER — FLEET ENEMA 7-19 GM/118ML RE ENEM: 1.0000 | ENEMA | Freq: Once | RECTAL | Status: DC | PRN

## 2016-02-22 MED ORDER — ONDANSETRON 4 MG PO TBDP
4.0000 mg | ORAL_TABLET | Freq: Four times a day (QID) | ORAL | Status: DC | PRN
  Filled 2016-02-22: qty 1

## 2016-02-22 MED ORDER — TRAMADOL HCL 50 MG PO TABS
50.0000 mg | ORAL_TABLET | Freq: Four times a day (QID) | ORAL | Status: DC | PRN
  Administered 2016-02-28: 50 mg via ORAL
  Filled 2016-02-22: qty 1

## 2016-02-22 MED ORDER — ENSURE ENLIVE PO LIQD
237.0000 mL | Freq: Three times a day (TID) | ORAL | Status: DC
  Administered 2016-02-25 – 2016-02-27 (×5): 237 mL via ORAL

## 2016-02-22 MED ORDER — MORPHINE SULFATE (PF) 4 MG/ML IV SOLN
2.0000 mg | Freq: Two times a day (BID) | INTRAVENOUS | Status: DC
  Administered 2016-02-23 – 2016-02-25 (×5): 2 mg via INTRAVENOUS
  Filled 2016-02-22 (×5): qty 1

## 2016-02-22 MED ORDER — SENNA 8.6 MG PO TABS: 1.0000 | ORAL_TABLET | Freq: Two times a day (BID) | ORAL | 0 refills | Status: DC

## 2016-02-22 MED ORDER — GUAIFENESIN-DM 100-10 MG/5ML PO SYRP: 5.0000 mL | ORAL_SOLUTION | Freq: Four times a day (QID) | ORAL | Status: DC | PRN

## 2016-02-22 MED ORDER — PROCHLORPERAZINE EDISYLATE 5 MG/ML IJ SOLN: 5.0000 mg | Freq: Four times a day (QID) | INTRAMUSCULAR | Status: DC | PRN

## 2016-02-22 MED ORDER — ALUM & MAG HYDROXIDE-SIMETH 200-200-20 MG/5ML PO SUSP: 30.0000 mL | ORAL | Status: DC | PRN

## 2016-02-22 MED ORDER — ACETAMINOPHEN 325 MG PO TABS: 325.0000 mg | ORAL_TABLET | ORAL | Status: DC | PRN

## 2016-02-22 MED ORDER — OXYCODONE HCL 5 MG PO TABS
5.0000 mg | ORAL_TABLET | Freq: Four times a day (QID) | ORAL | Status: DC | PRN
  Administered 2016-02-25 – 2016-02-26 (×2): 5 mg via ORAL
  Filled 2016-02-22: qty 1

## 2016-02-22 MED ORDER — POLYETHYLENE GLYCOL 3350 17 G PO PACK: 17.0000 g | PACK | Freq: Every day | ORAL | Status: DC | PRN

## 2016-02-22 MED ORDER — ADULT MULTIVITAMIN W/MINERALS CH
1.0000 | ORAL_TABLET | Freq: Every day | ORAL | Status: DC
  Administered 2016-02-23 – 2016-03-05 (×12): 1 via ORAL
  Filled 2016-02-22 (×12): qty 1

## 2016-02-22 NOTE — Consult Note (Addendum)
[facility]  Total days of antibiotics 10        Day 10 vanco        Day 10 piptazo        Day 7 fluc       Reason for Consult: necrotizing fasciitis and bacteroides bacteremia    Referring Physician: [email or web address]  Principal Problem:   Abscess of groin, right Active Problems:   Septic shock ([place])   UTI (urinary tract infection)   Sepsis ([place])   Fever   History of cervical cancer   Acute blood loss anemia   Anemia of chronic disease   Tachypnea   Lymphocytosis   Super obese (HCC)   Abscess of right groin    HPI: Brandy Hardy is a 60 y.o. female has hx of stage 2 cervical ca s/p chemo and radiation c/b periurethral ulcerations from radiation, hx on endometrial cancer, s/p TAH-BSO, and obesity. She presented on 11/08 with one week history of fever and suprapubic pain that she attributed to UTI vsradiation but progressively felt worse. She first went to urgent care that dx her with UTI but then became encephalopathic and weak with ground level fall requiring EMS transport to the hospital on 11/8. On admit, she was found febrile to 102.6F, hypotensive with sBP of 75, leukocytosis of 21K, LA 4.7. Source of infection was thought to be right groin abscess. CT showed air/gas formation within tissue planes concerning for necrotizing fasciitis. The patient was started on IVF, vanco/piptazo and vasopressors. She was taken to the OR on 11/11 to debride mons busic, and fascia down to superior pubic ramus as well as right thigh. Her micro culture showed + bacteroides fragilis on the only set of blood cx that could be drawn on admit, and tissue cx from OR showed mixed anaerobic flora. She had 2nd repeat IX D on 11/15 that debrided remaining non-viable tissue that resulted in wounds measuring right groin of 25cm long, 8 cm wide, and 6 cm deep and right thigh of 15cm long x 6 cm wide x 4 cm deep. She has been maintained on broad spectrum abtx. ID asked to weight in on abtx  recommendations.   Past Medical History:  Diagnosis Date  . Cervical adenocarcinoma ([place])    stage II  . Endometrial cancer ([place])    with recurrance at introitus   . History of prediabetes   . 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 atorvastatin  40 mg Oral Daily  . Chlorhexidine Gluconate Cloth  6 each Topical Once  . feeding supplement (ENSURE ENLIVE)  237 mL Oral TID WC  . fluconazole  200 mg Oral Daily  . heparin  5,000 Units Subcutaneous Q8H  . multivitamin with minerals  1 tablet Oral Daily  . pantoprazole  40 mg Oral Daily  . piperacillin-tazobactam (ZOSYN)  IV  3.375 g Intravenous Q8H  . senna  1 tablet Oral BID    Social History  Substance Use Topics  . Smoking status: Never Smoker  . Smokeless tobacco: Never Used  . Alcohol use No    Family History  Problem Relation Age of Onset  . Hypertension Mother   . Heart disease Father 50    Expired  . Diabetes Neg Hx      Review of Systems  Constitutional: Negative for fever, chills, diaphoresis, activity change, appetite change, fatigue and unexpected weight  change.  HENT: Negative for congestion, sore throat, rhinorrhea, sneezing, trouble swallowing and sinus pressure.  Eyes: Negative for photophobia and visual disturbance.  Respiratory: Negative for cough, chest tightness, shortness of breath, wheezing and stridor.  Cardiovascular: Negative for chest pain, palpitations and leg swelling.  Gastrointestinal: Negative for nausea, vomiting, abdominal pain, diarrhea, constipation, blood in stool, abdominal distention and anal bleeding.  Genitourinary: + lower groin pain due to surgical debridement Musculoskeletal: Negative for myalgias, back pain, joint swelling, arthralgias and gait problem.  Skin: Negative for color change, pallor, rash and wound.  Neurological: Negative for dizziness, tremors, weakness and light-headedness.  Hematological:  Negative for adenopathy. Does not bruise/bleed easily.  Psychiatric/Behavioral: Negative for behavioral problems, confusion, sleep disturbance, dysphoric mood, decreased concentration and agitation.     OBJECTIVE: Temp:  [98.3 F (36.8 C)-98.6 F (37 C)] 98.6 F (37 C) (11/17 0500) Pulse Rate:  [80-84] 80 (11/17 0500) Resp:  [25-29] 29 (11/17 0500) BP: (111-113)/(50-53) 113/50 (11/17 0500) SpO2:  [92 %-94 %] 92 % (11/17 0500) Weight:  [297 lb 9.6 oz (135 kg)] 297 lb 9.6 oz (135 kg) (11/17 0500) Physical Exam  Constitutional:  oriented to person, place, and time. appears well-developed and well-nourished. No distress.  HENT: [place]/AT, PERRLA, no scleral icterus Mouth/Throat: Oropharynx is clear and moist. No oropharyngeal exudate.  Cardiovascular: Normal rate, regular rhythm and normal heart sounds. Exam reveals no gallop and no friction rub.  No murmur heard.  Chest wall: right sided upper chest wall port Pulmonary/Chest: Effort normal and breath sounds normal. No respiratory distress.  has no wheezes.  Neck = supple, no nuchal rigidity Abdominal: Soft. Bowel sounds are normal.  exhibits no distension. There is no tenderness.  GU: wound is packed at mons pubis and right upper thigh Lymphadenopathy: no cervical adenopathy. No axillary adenopathy Neurological: alert and oriented to person, place, and time.  Skin: Skin is warm and dry. No rash noted. No erythema.  Psychiatric: a normal mood and affect.  behavior is normal.    LABS: Results for orders placed or performed during the hospital encounter of 02/14/16 (from the past 48 hour(s))  CBC     Status: Abnormal   Collection Time: 02/20/16 10:07 PM  Result Value Ref Range   WBC 10.2 4.0 - 10.5 K/uL   RBC 2.61 (L) 3.87 - 5.11 MIL/uL   Hemoglobin 8.0 (L) 12.0 - 15.0 g/dL   HCT 24.8 (L) 36.0 - 46.0 %   MCV 95.0 78.0 - 100.0 fL   MCH 30.7 26.0 - 34.0 pg   MCHC 32.3 30.0 - 36.0 g/dL   RDW 15.4 11.5 - 15.5 %   Platelets 324 150 -  400 K/uL  Basic metabolic panel     Status: Abnormal   Collection Time: 02/20/16 10:07 PM  Result Value Ref Range   Sodium 139 135 - 145 mmol/L   Potassium 3.8 3.5 - 5.1 mmol/L   Chloride 104 101 - 111 mmol/L   CO2 28 22 - 32 mmol/L   Glucose, Bld 132 (H) 65 - 99 mg/dL   BUN 6 6 - 20 mg/dL   Creatinine, Ser 0.69 0.44 - 1.00 mg/dL   Calcium 7.6 (L) 8.9 - 10.3 mg/dL   GFR calc non Af Amer >60 >60 mL/min   GFR calc Af Amer >60 >60 mL/min    Comment: (NOTE) The eGFR has been calculated using the CKD EPI equation. This calculation has not been validated in all clinical situations. eGFR's persistently <60 mL/min signify possible  Chronic Kidney Disease.    Anion gap 7 5 - 15  CBC     Status: Abnormal   Collection Time: 02/21/16  7:10 AM  Result Value Ref Range   WBC 11.6 (H) 4.0 - 10.5 K/uL   RBC 2.89 (L) 3.87 - 5.11 MIL/uL   Hemoglobin 8.8 (L) 12.0 - 15.0 g/dL   HCT 27.5 (L) 36.0 - 46.0 %   MCV 95.2 78.0 - 100.0 fL   MCH 30.4 26.0 - 34.0 pg   MCHC 32.0 30.0 - 36.0 g/dL   RDW 15.6 (H) 11.5 - 15.5 %   Platelets 379 150 - 400 K/uL  CBC     Status: Abnormal   Collection Time: 02/22/16  6:35 AM  Result Value Ref Range   WBC 10.1 4.0 - 10.5 K/uL   RBC 2.74 (L) 3.87 - 5.11 MIL/uL   Hemoglobin 8.5 (L) 12.0 - 15.0 g/dL   HCT 25.9 (L) 36.0 - 46.0 %   MCV 94.5 78.0 - 100.0 fL   MCH 31.0 26.0 - 34.0 pg   MCHC 32.8 30.0 - 36.0 g/dL   RDW 15.9 (H) 11.5 - 15.5 %   Platelets 340 150 - 400 K/uL    MICRO: 11/9 blood cx bacteroides 11/11 tissue cx mixed anaerobes  IMAGING: Pelvic CT: Changes in the low pelvis anteriorly as described. This likely represents an area of subcutaneous cellulitis with early abscess formation. No definitive fluid collection is noted. The air in inflammatory change tracks into musculature of the upper thigh bilaterally as well as towards the pubic symphysis and into the right hemipelvis adjacent to the bladder. These changes may be related to the patient's  recent UTI.  Distal left ureteral stone without significant obstructive change.  Mild fullness of the right collecting system without definitive calculus.   Assessment/Plan:  60yo F who presented with severe sepsis 2/2 necrotizing fasciitis of groin with secondary bacteroides bacteremia with sequelae of large wound beds measuring right groin of 25cm long, 8 cm wide, and 6 cm deep and right thigh of 15cm long x 6 cm wide x 4 cm deep.   - narrow abtx to piptazo IV and fluconazole oral to treat mixed anaerobic flora that would also treat bacteroides bacteremia - plan to treat for deep tissue infection with 3 wk of IV therapy using 11/12 as day 1. - will check sed rate and crp - she will need to continue with wound care for management of surgical wound - depending on its healing process, she may need addn chronic suppression with oral abtx after she is finished with IV abtx - we will see her back in the ID clinic in 3 wk - home health orders listed below - I will check in on her on Monday at CIR  Periurethral ulcers/urinary retention = foley dependent presently, defer to gyn/onc at [facility] and may need further urology work up for urinary retention  Leukocytosis and severe sepsis = improved, now has source control from repeated debridement and needs local wound care plus iv abtx  Health maintenance - will check hiv and hep c ab  Home health orders listed below:  ------------------------------------------- Diagnosis: Necrotizing fasciitis and bacteremia  Culture Result: mixed anaerobes, bacteroides  No Known Allergies  Discharge antibiotics: Per pharmacy protocol piptazo  Duration: 3 weeks End Date: Dec 3rd  [facility] Care Per Protocol:  Labs weekly while on IV antibiotics: __ x CBC with differential  _x _ CMP _x_ CRP _x_ ESR   __ Patient  has port   Fax weekly labs to [phone]  Clinic Follow Up Appt: 3 wk   @ [facility]

## 2016-02-22 NOTE — Progress Notes (Signed)
Occupational Therapy Treatment Patient Details Name: Brandy Hardy MRN: [id number] DOB: [birth year] Today's Date: 02/22/2016    History of present illness Patient is a 60 y/o female with hx of cervical cancer (radiation 2016),presents from home with weakness, falls, dysuria, suprapubic pain, and urinary frequency for past 5 days. Found to be febrile, tachycardic, and hypotensive secondary to sepsis from UTI.  Noted to have right groin abscess s/p I&D for necrotizing fasciitis.    OT comments  Pt able to perform functional mobility in room with min assist +2 but requires mod assist +2 for sit to stand. Pt tolerated standing at the sink to complete grooming activities with min assist. Pt requires multiple rest breaks during activities due to fatigue. Pt with elevated HR (up to 146) during functional mobility; returned to low 90s with seated rest. D/c plan remains appropriate. Will continue to follow acutely.   Follow Up Recommendations  CIR;Supervision/Assistance - 24 hour    Equipment Recommendations  Tub/shower seat    Recommendations for Other Services      Precautions / Restrictions Precautions Precautions: Fall Precaution Comments: monitor HR Restrictions Weight Bearing Restrictions: No       Mobility Bed Mobility Overal bed mobility: Needs Assistance Bed Mobility: Rolling;Supine to Sit;Sit to Supine Rolling: Max assist   Supine to sit: Max assist;+2 for physical assistance Sit to supine: Max assist;+2 for physical assistance   General bed mobility comments: With supine to sit, pt needed A for R LE and to get trunk upright.  With sit >supine, she needed A to get B LE up onto bed as well as trunk.  Then needed to get scooted to middle of bed.  Pt able to help pull self up in bed with rails with bed in Trendelenberg position.  Transfers Overall transfer level: Needs assistance Equipment used: Rolling walker (2 wheeled) Transfers: Sit to/from Stand Sit to Stand: Mod  assist;Min assist;+2 physical assistance         General transfer comment: MOD A of 2 from bed and close to MIN A of 2 from recliner with increased time.    Balance Overall balance assessment: Needs assistance Sitting-balance support: Feet supported;No upper extremity supported Sitting balance-Leahy Scale: Fair Sitting balance - Comments: Pt able to raise arms for UB dressing. Otherwise requires UE support   Standing balance support: Bilateral upper extremity supported;During functional activity Standing balance-Leahy Scale: Poor Standing balance comment: bil forearms leaning on sink for support in standing                   ADL Overall ADL's : Needs assistance/impaired     Grooming: Minimal assistance;Oral care;Standing;Wash/dry face;Sitting Grooming Details (indicate cue type and reason): Min assist for standing balance. Pt with bil forearms resting on edge of sink throughout task.         Upper Body Dressing : Minimal assistance;Sitting Upper Body Dressing Details (indicate cue type and reason): to don/doff hospital gown sitting EOB Lower Body Dressing: Total assistance Lower Body Dressing Details (indicate cue type and reason): Total assist to readjust socks in sitting Toilet Transfer: Moderate assistance;+2 for physical assistance;Ambulation;BSC;RW Toilet Transfer Details (indicate cue type and reason): Simulated by sit to stand from EOB with functional mobility in room.         Functional mobility during ADLs: Minimal assistance;+2 for safety/equipment General ADL Comments: Pt very deconditioned and quick to fatigue. Pt with SOB after a few steps requiring multiple rest breaks with mobility in room. HR up to 146 with  activity; returned to low 90s with seated rest break.      Vision                     Perception     Praxis      Cognition   Behavior During Therapy: WFL for tasks assessed/performed Overall Cognitive Status: Within Functional  Limits for tasks assessed                       Extremity/Trunk Assessment               Exercises     Shoulder Instructions       General Comments      Pertinent Vitals/ Pain       Pain Assessment: Faces Faces Pain Scale: Hurts a little bit Pain Location: RLE Pain Descriptors / Indicators: Grimacing Pain Intervention(s): Monitored during session;Limited activity within patient's tolerance  Home Living                                          Prior Functioning/Environment              Frequency  Min 2X/week        Progress Toward Goals  OT Goals(current goals can now be found in the care plan section)  Progress towards OT goals: Progressing toward goals  Acute Rehab OT Goals Patient Stated Goal: to get better  OT Goal Formulation: With patient  Plan Discharge plan remains appropriate    Co-evaluation    PT/OT/SLP Co-Evaluation/Treatment: Yes Reason for Co-Treatment: For patient/therapist safety PT goals addressed during session: Mobility/safety with mobility;Proper use of DME OT goals addressed during session: ADL's and self-care      End of Session Equipment Utilized During Treatment: Gait belt;Rolling walker   Activity Tolerance Patient tolerated treatment well   Patient Left in bed;with call bell/phone within reach;with SCD's reapplied   Nurse Communication Mobility status        Time: 8469-6295 OT Time Calculation (min): 38 min  Charges: OT General Charges $OT Visit: 1 Procedure OT Treatments $Self Care/Home Management : 8-22 mins  Gaye Alken M.S., OTR/L Pager: [phone]  02/22/2016, 11:14 AM

## 2016-02-22 NOTE — Progress Notes (Signed)
Physical Therapy Treatment Patient Details Name: Brandy Hardy MRN: [id number] DOB: 1956-01-03 Today's Date: 02/22/2016    History of Present Illness Patient is a 60 y/o female with hx of cervical cancer (radiation 2016),presents from home with weakness, falls, dysuria, suprapubic pain, and urinary frequency for past 5 days. Found to be febrile, tachycardic, and hypotensive secondary to sepsis from UTI.  Noted to have right groin abscess s/p I&D for necrotizing fasciitis.     PT Comments    Pt ambulated in room 6' and 10' with multiple standing rest breaks and HR reaching 147 bpm.  She has the most difficulty with bed mobility. Pt is pleasant and cooperative. Con't to recommend CIR.    Follow Up Recommendations  CIR     Equipment Recommendations  None recommended by PT    Recommendations for Other Services       Precautions / Restrictions Precautions Precautions: Fall Precaution Comments: monitor HR Restrictions Weight Bearing Restrictions: No    Mobility  Bed Mobility Overal bed mobility: Needs Assistance Bed Mobility: Rolling;Supine to Sit;Sit to Supine Rolling: Max assist   Supine to sit: Max assist;+2 for physical assistance Sit to supine: Max assist;+2 for physical assistance   General bed mobility comments: With supine to sit, pt needed A for R LE and to get trunk upright.  With sit >supine, she needed A to get B LE up onto bed as well as trunk.  Then needed to get scooted to middle of bed.  Pt able to help pull self up in bed with rails with bed in Trendelenberg position.  Transfers Overall transfer level: Needs assistance Equipment used: Rolling walker (2 wheeled) Transfers: Sit to/from Stand Sit to Stand: Mod assist;Min assist;+2 physical assistance         General transfer comment: MOD A of 2 from bed and close to MIN A of 2 from recliner with increased time.  Ambulation/Gait Ambulation/Gait assistance: Min assist;+2 safety/equipment Ambulation  Distance (Feet): 6 Feet (plus 10') Assistive device: Rolling walker (2 wheeled) Gait Pattern/deviations: Decreased step length - right;Wide base of support;Antalgic;Trunk flexed Gait velocity: decreased Gait velocity interpretation: Below normal speed for age/gender General Gait Details: Pt ambulated 6' to sink did ADL with HR up to 170, however not a good EKG reading, and pt not showing signs of extreme fatigue or having any subjective reports of heart racing. Discovered one of her leads was off.  Lead placed back on and HR in low 100s. Then ambulated 10' back to bed with 3 standing rest breaks.  MAX HR was 147 with gait and pt was fatigued and wave form looked good.   Stairs            Wheelchair Mobility    Modified Rankin (Stroke Patients Only)       Balance Overall balance assessment: Needs assistance Sitting-balance support: Feet supported Sitting balance-Leahy Scale: Fair Sitting balance - Comments: Requires UE support sitting EOB.   Standing balance support: During functional activity Standing balance-Leahy Scale: Poor Standing balance comment: leaning forward on sink                    Cognition Arousal/Alertness: Awake/alert Behavior During Therapy: WFL for tasks assessed/performed Overall Cognitive Status: Within Functional Limits for tasks assessed                      Exercises      General Comments        Pertinent Vitals/Pain Faces Pain Scale:  Hurts a little bit Pain Location: R LE Pain Descriptors / Indicators: Grimacing Pain Intervention(s): Limited activity within patient's tolerance;Monitored during session    Home Living                      Prior Function            PT Goals (current goals can now be found in the care plan section) Acute Rehab PT Goals Patient Stated Goal: to get better  PT Goal Formulation: With patient Time For Goal Achievement: 03/04/16 Potential to Achieve Goals: Good Progress towards PT  goals: Progressing toward goals    Frequency    Min 3X/week      PT Plan Current plan remains appropriate    Co-evaluation PT/OT/SLP Co-Evaluation/Treatment: Yes Reason for Co-Treatment: For patient/therapist safety PT goals addressed during session: Mobility/safety with mobility;Proper use of DME       End of Session Equipment Utilized During Treatment: Gait belt Activity Tolerance: Patient limited by fatigue;Treatment limited secondary to medical complications (Comment) (tachycardia) Patient left: in bed;with call bell/phone within reach;Other (comment) (returned to bed due to pending dressing change)     Time: 2130-8657 PT Time Calculation (min) (ACUTE ONLY): 37 min  Charges:  $Gait Training: 8-22 mins                    G Codes:      Sidhant Helderman LUBECK 02/22/2016, 10:52 AM

## 2016-02-22 NOTE — Discharge Summary (Signed)
Physician Discharge Summary  Sudiksha Rippy WUJ:[id number] DOB: 11/13/1955 DOA: 02/14/2016  PCP: Lupe Carney, MD  Admit date: 02/14/2016 Discharge date: 02/22/2016  Recommendations for Outpatient Follow-up:  1. Pt will be discharged to inpatient Rehab at [facility]  2. Please note ABX regimen per Dr. Drue Second ID specialist  - piptazo IV and fluconazole oral to treat mixed anaerobic flora that would also treat bacteroides bacteremia - plan to treat for deep tissue infection with 3 wk of IV therapy using 11/12 as day 1. - pt will need to continue with wound care for management of surgical wound - depending on its healing process, she may need addn chronic suppression with oral abtx after she is finished with IV abtx - ID doctor will see pt back in the ID clinic in 3 wk  Discharge Diagnoses:  Principal Problem:   Abscess of groin, right Active Problems:   Septic shock [facility])  Discharge Condition: Stable  Diet recommendation: Heart healthy diet discussed in details   Brief Narrative:  60 y.o.female who presented to ED for weakness s/p fall without LOC or head trauma. Was experiencing dysuria, suprapubic pain, and urinary frequency for 5 days. Was evaluated at Urgent Care facility and diagnosed with UTI. Was given Rx for Norco, Nitrofurantoin, and Azo. She had taken 3 days of antibiotics. A urine culture was sent but patient is unaware of the results. After starting these medications, dysuria improved but her other symptoms did not.   In the ED, patient was found to be febrile, tachycardic, and hypotensive. Labs were significant for K 2.4, leukocytosis of 20.9, and lactic acid of 4.7. UA appeared consistent with an infectious process. Code sepsis was called. Patient was given NS 4L bolus without improvement in blood pressure. Therefore, Levophed was initiated. Broad spectrum antibiotics were initiated.  Status post incision and drainage of right thigh abscess, full thickness debridement of  40cm^2 right groin for necrotizing fasciitis 11/11.  Patient underwent repeat I&D on 11/15.  Significant Events: 11/9 Admit for septic shock 11/10 [facility] assumed care    Subjective: Patient seen this morning while surgical team and patient's RN were performing dressing changes. Postop wounds looked good. Patient was an appropriate pain. She denied any other complaints.  Assessment & Plan: Septic Shock due to necrotizing fasciitis levophed now off - urine culture <10,000 colonies - continue broad-spectrum empiric coverage Follow culture from incision and drainage on 11/11 > mixed anaerobic flora Blood culture 1 from 02/14/16: Shows Bacteroides fragilis? Significance Continue ABX as noted above, Zosyn and Fluconazole for three weeks total and day #1 is 02/17/2016  Mons pubis and right thigh necrotizing fasciitis Continue broad-spectrum antibiotics -added vancomycin to Zosyn and fluconazole on 11/11. Gen. surgery consulted.  Status post incision and drainage of right thigh abscess, full thickness debridement of 40cm^2 right groin for necrotizing fasciitis 11/11. Gram stain shows abundant gram-positive cocci in pairs and gram-negative rods. Appreciate surgery input.  Continue twice a day daily packing changes. Consulted wound care nurse for recommendations.  On 02/20/16, patient underwent repeat I&D in the OR. As per surgical team > she should be stable for discharge to CIR tomorrow. Upon transfer she will need to continue twice a day wet to dry dressing changes as well as IV antibiotics (duration per surgery).  Foley catheter was placed intraoperatively  AKI  Due to above (bl Cr 0.6)  Resolved  Severe protein calorie malnutrition Albumin 1.1, nutrition therapy following  Mildly elevated troponin  Follow to [facility] - no sx  at present to suggest USAP/denies CP 2-D echo  showed EF of 65-70%, wall motion was normal, no regional wall motion abnormalities,  Hypokalemia   Supplemented  Candidal intertrigo -patient on fluconazole  Morbid obesity Body mass index is 52.84 kg/m.   Anemia Stable.  History of cervical cancer status post radiation  Essential hypertension Not on medications prior to admission. Reasonable inpatient control.   DVT prophylaxis: SQ heparin  Code Status: FULL CODE Family Communication: Discussed in detail with patient Disposition Plan:  Inpatient rehab 11/17  Consultants:  PCCM General surgery Rehabilitation M.D.  ID  Antimicrobials:  Vancomycin discontinued  Zosyn 11/12 and Fluconazole 11/12 --> 3 weeks total  Procedures/Studies: Dg Chest 1 View  Result Date: 02/14/2016 CLINICAL DATA:  Urinary tract infection. EXAM: CHEST 1 VIEW COMPARISON:  Chest radiograph 02/21/2009 FINDINGS: Right chest wall Port-A-Cath tip overlies the proximal right atrium. There is shallow lung inflation and cardiomegaly. No pneumothorax or sizable pleural effusion. No focal airspace consolidation or pulmonary edema. IMPRESSION: 1. Port-A-Cath tip overlying the proximal right atrium. 2. Cardiomegaly and shallow lung inflation without focal airspace disease. Electronically Signed   By: Deatra Robinson M.D.   On: 02/14/2016 03:40   Ct Renal Stone Study  Result Date: 02/14/2016 CLINICAL DATA:  Lower abdominal pain for several weeks with history of UTI EXAM: CT ABDOMEN AND PELVIS WITHOUT CONTRAST TECHNIQUE: Multidetector CT imaging of the abdomen and pelvis was performed following the standard protocol without IV contrast. COMPARISON:  04/21/2007 FINDINGS: Lower chest: No acute abnormality. Hepatobiliary: The liver is within normal limits. The gallbladder is well distended with a few small dependent gallstones. Pancreas: Within normal limits. Spleen: Within normal limits. Adrenals/Urinary Tract: The adrenal glands are unremarkable. The left kidney demonstrates no renal calculi. The ureter is well visualized and a tiny calcific density is noted  adjacent to the distal left ureter best seen on image number 80 of series 2. This appears to represent a nonobstructing distal ureteral stone. The right kidney demonstrates mild fullness of the collecting system although no definitive calculus is seen. The bladder is partially distended. Stomach/Bowel: Diverticular change is seen without diverticulosis. The appendix is within normal limits. Vascular/Lymphatic: Aortic atherosclerosis. No enlarged abdominal or pelvic lymph nodes. Reproductive: Status post hysterectomy. No adnexal masses. Other: In the anterior pelvic wall just below the pubic symphysis and to the right of the midline there is a mottled collection of air within the subcutaneous fat with surrounding inflammatory changes. This extends into the adjacent pelvic musculature bilaterally slightly greater on the right than the left. Additionally this air tracks superiorly into the pubic symphysis. No definitive fluid collection is identified. The air also tracks into the low pelvis on the right adjacent to the urinary bladder. Musculoskeletal: Degenerative changes of the lumbar spine are noted. No other focal abnormality is seen. IMPRESSION: Changes in the low pelvis anteriorly as described. This likely represents an area of subcutaneous cellulitis with early abscess formation. No definitive fluid collection is noted. The air in inflammatory change tracks into musculature of the upper thigh bilaterally as well as towards the pubic symphysis and into the right hemipelvis adjacent to the bladder. These changes may be related to the patient's recent UTI. Distal left ureteral stone without significant obstructive change. Mild fullness of the right collecting system without definitive calculus. No evidence of osteomyelitis is noted. Electronically Signed   By: Alcide Clever M.D.   On: 02/14/2016 12:26   Discharge Exam: Vitals:   02/21/16 2055 02/22/16 0500  BP: (!) 111/53 (!) 113/50  Pulse: 84 80  Resp: (!)  25 (!) 29  Temp: 98.3 F (36.8 C) 98.6 F (37 C)   Vitals:   02/20/16 1845 02/20/16 2220 02/21/16 2055 02/22/16 0500  BP: (!) 121/56 (!) 111/52 (!) 111/53 (!) 113/50  Pulse: 66 78 84 80  Resp: (!) 22 (!) 23 (!) 25 (!) 29  Temp: 97.9 F (36.6 C) 98.5 F (36.9 C) 98.3 F (36.8 C) 98.6 F (37 C)  TempSrc:  Oral Oral Oral  SpO2: 100% 97% 94% 92%  Weight:    135 kg (297 lb 9.6 oz)  Height:        General: Pt is alert, follows commands appropriately, not in acute distress Cardiovascular: Regular rate and rhythm, S1/S2 +, no murmurs, no rubs, no gallops Respiratory: Clear to auscultation bilaterally, no wheezing, no crackles, no rhonchi  Discharge Instructions  Discharge Instructions    Diet - low sodium heart healthy    Complete by:  As directed    Increase activity slowly    Complete by:  As directed        Medication List    STOP taking these medications   celecoxib 200 MG capsule Commonly known as:  CELEBREX   HYDROcodone-acetaminophen 5-325 MG tablet Commonly known as:  NORCO/VICODIN   nitrofurantoin 100 MG capsule Commonly known as:  MACRODANTIN     TAKE these medications   atorvastatin 40 MG tablet Commonly known as:  LIPITOR TAKE 1 TABLET DAILY   fluconazole 200 MG tablet Commonly known as:  DIFLUCAN Take 1 tablet (200 mg total) by mouth daily. Start taking on:  02/23/2016   methocarbamol 500 MG tablet Commonly known as:  ROBAXIN Take 1 tablet (500 mg total) by mouth every 8 (eight) hours as needed (mild pain).   oxyCODONE 5 MG immediate release tablet Commonly known as:  Oxy IR/ROXICODONE Take 1 tablet (5 mg total) by mouth every 6 (six) hours as needed for moderate pain.   piperacillin-tazobactam 3.375 GM/50ML IVPB Commonly known as:  ZOSYN Inject 50 mLs (3.375 g total) into the vein every 8 (eight) hours.   PROBIOTIC DAILY PO Take 1 tablet by mouth daily.   senna 8.6 MG Tabs tablet Commonly known as:  SENOKOT Take 1 tablet (8.6 mg total)  by mouth 2 (two) times daily.   Vitamin D (Ergocalciferol) 50000 units Caps capsule Commonly known as:  DRISDOL TAKE 1 CAPSULE WEEKLY       Follow-up Information    Lupe Carney, MD Follow up.   Specialty:  Family Medicine Contact information: [address] [place] [address] [phone]        Rodman Pickle, MD. Call.   Specialty:  General Surgery Why:  1 week after discharge Contact information: [address] [place] [address] [phone]            The results of significant diagnostics from this hospitalization (including imaging, microbiology, ancillary and laboratory) are listed below for reference.     Microbiology: Recent Results (from the past 240 hour(s))  Urine culture     Status: Abnormal   Collection Time: 02/14/16  2:28 AM  Result Value Ref Range Status   Specimen Description URINE, RANDOM  Final   Special Requests NONE  Final   Culture <10,000 COLONIES/mL INSIGNIFICANT GROWTH (A)  Final   Report Status 02/15/2016 FINAL  Final  Blood Culture (routine x 2)     Status: Abnormal  Collection Time: 02/14/16  2:40 AM  Result Value Ref Range Status   Specimen Description BLOOD LEFT ARM  Final   Special Requests BOTTLES DRAWN AEROBIC AND ANAEROBIC  Final   Culture  Setup Time   Final    GRAM NEGATIVE RODS ANAEROBIC BOTTLE ONLY CRITICAL RESULT CALLED TO, READ BACK BY AND VERIFIED WITH: Hollie Beach AT 2956 02/17/16 BY L BENFIELD    Culture BACTEROIDES FRAGILIS BETA LACTAMASE POSITIVE  (A)  Final   Report Status 02/20/2016 FINAL  Final  Blood Culture ID Panel (Reflexed)     Status: None   Collection Time: 02/14/16  2:40 AM  Result Value Ref Range Status   Enterococcus species NOT DETECTED NOT DETECTED Final   Listeria monocytogenes NOT DETECTED NOT DETECTED Final   Staphylococcus species NOT DETECTED NOT DETECTED Final   Staphylococcus aureus NOT DETECTED NOT DETECTED Final   Streptococcus  species NOT DETECTED NOT DETECTED Final   Streptococcus agalactiae NOT DETECTED NOT DETECTED Final   Streptococcus pneumoniae NOT DETECTED NOT DETECTED Final   Streptococcus pyogenes NOT DETECTED NOT DETECTED Final   Acinetobacter baumannii NOT DETECTED NOT DETECTED Final   Enterobacteriaceae species NOT DETECTED NOT DETECTED Final   Enterobacter cloacae complex NOT DETECTED NOT DETECTED Final   Escherichia coli NOT DETECTED NOT DETECTED Final   Klebsiella oxytoca NOT DETECTED NOT DETECTED Final   Klebsiella pneumoniae NOT DETECTED NOT DETECTED Final   Proteus species NOT DETECTED NOT DETECTED Final   Serratia marcescens NOT DETECTED NOT DETECTED Final   Haemophilus influenzae NOT DETECTED NOT DETECTED Final   Neisseria meningitidis NOT DETECTED NOT DETECTED Final   Pseudomonas aeruginosa NOT DETECTED NOT DETECTED Final   Candida albicans NOT DETECTED NOT DETECTED Final   Candida glabrata NOT DETECTED NOT DETECTED Final   Candida krusei NOT DETECTED NOT DETECTED Final   Candida parapsilosis NOT DETECTED NOT DETECTED Final   Candida tropicalis NOT DETECTED NOT DETECTED Final  MRSA PCR Screening     Status: None   Collection Time: 02/14/16  6:04 AM  Result Value Ref Range Status   MRSA by PCR NEGATIVE NEGATIVE Final    Comment:        The GeneXpert MRSA Assay (FDA approved for NASAL specimens only), is one component of a comprehensive MRSA colonization surveillance program. It is not intended to diagnose MRSA infection nor to guide or monitor treatment for MRSA infections.   Surgical pcr screen     Status: None   Collection Time: 02/16/16  5:00 PM  Result Value Ref Range Status   MRSA, PCR NEGATIVE NEGATIVE Final   Staphylococcus aureus NEGATIVE NEGATIVE Final    Comment:        The Xpert SA Assay (FDA approved for NASAL specimens in patients over 27 years of age), is one component of a comprehensive surveillance program.  Test performance has been validated by  [facility] for patients greater than or equal to 28 year old. It is not intended to diagnose infection nor to guide or monitor treatment.   Aerobic/Anaerobic Culture (surgical/deep wound)     Status: None   Collection Time: 02/16/16  7:52 PM  Result Value Ref Range Status   Specimen Description ABSCESS GROIN  Final   Special Requests NONE  Final   Gram Stain   Final    ABUNDANT WBC PRESENT, PREDOMINANTLY PMN RARE GRAM POSITIVE COCCI IN PAIRS RARE GRAM NEGATIVE RODS    Culture   Final    MIXED ANAEROBIC FLORA  PRESENT.  CALL LAB IF FURTHER IID REQUIRED.   Report Status 02/21/2016 FINAL  Final     Labs: Basic Metabolic Panel:  Recent Labs Lab 02/16/16 0510 02/17/16 0500 02/18/16 1109 02/19/16 0410 02/20/16 2207  NA 139 138 138 138 139  K 3.2* 3.2* 3.7 4.4 3.8  CL 104 103 103 104 104  CO2 27 28 29 27 28   GLUCOSE 108* 108* 123* 103* 132*  BUN 16 7 7 7 6   CREATININE 0.80 0.60 0.59 0.62 0.69  CALCIUM 7.1* 7.2* 7.1* 7.3* 7.6*  MG  --   --  1.8  --   --    Liver Function Tests:  Recent Labs Lab 02/17/16 0500 02/18/16 1109  AST 59* 43*  ALT 43 33  ALKPHOS 93 86  BILITOT 1.0 0.6  PROT 4.3* 4.0*  ALBUMIN 1.1* 1.0*   CBC:  Recent Labs Lab 02/19/16 0410 02/20/16 0515 02/20/16 2207 02/21/16 0710 02/22/16 0635  WBC 13.4* 11.9* 10.2 11.6* 10.1  HGB 8.5* 8.6* 8.0* 8.8* 8.5*  HCT 26.4* 27.3* 24.8* 27.5* 25.9*  MCV 94.3 94.1 95.0 95.2 94.5  PLT 280 312 324 379 340   Cardiac Enzymes:  Recent Labs Lab 02/15/16 1445 02/16/16 0510  TROPONINI 0.70* 0.05*   SIGNED: Time coordinating discharge:  30 minutes  MAGICK-Hershel Corkery, MD  [facility] 02/22/2016, 11:11 AM Pager [phone]  If 7PM-7AM, please contact night-coverage [email or web address] Password [email or web address]

## 2016-02-22 NOTE — H&P (Signed)
Physical Medicine and Rehabilitation Admission H&P       Chief Complaint  Patient presents with  . Debility   HPI: Brandy Hardy is a 60 year old female with history of recurrent endometrial cancer s/p hysterectomy and XRT, morbid obesity,  recent UTI who was admitted on 02/14/16 with weakness, poor po intake, abdominal pain and near syncope. She was noted to be dehydrated and hypotensive due to septic shock. She was treated with fluid bolus, IV antibiotics  and levophed. CT abdomen showed areas of subcutaneous cellulitis with early abscess formation and air with inflammatory changes tracing into bilateral upper thigh musculature.  She developed right groin drainage due to abscess. 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 Kieth Brightly on 11/11. She continued to have copious malodorous drainage from wound and was taken back to OR on 11/15 for repeat I and D and was found to have urinary retention during placement of foley catheter.   Blood culture X 2 positive for beta lactamase positive Bacteriodes fragilis.  Wound culture with mixed flora. To continue bid dressing as will not be able to place VAC and continue IV antibiotics per CCS.  Dr. Baxter Flattery recommends weeks of IV antibiotic regiment with 11/12 as day one--antibiotic narrowed to Zosyn and Flucanozole.  Therapy evaluations done showing patient limited by generalized weakness and difficulty mobilizing due to pain and body habitus.  CIR recommended for follow up therapy.    Review of Systems  HENT: Negative for hearing loss and tinnitus.   Eyes: Negative for blurred vision and double vision.  Respiratory: Negative for cough and shortness of breath.   Cardiovascular: Positive for leg swelling. Negative for chest pain and palpitations.  Gastrointestinal: Negative for abdominal pain, constipation, heartburn and nausea.  Genitourinary:  Negative for dysuria and frequency.  Musculoskeletal: Negative for back pain, myalgias and neck pain.  Skin: Negative for itching and rash.  Neurological: Positive for weakness. Negative for dizziness, sensory change and focal weakness.  Psychiatric/Behavioral: Negative for memory loss. The patient has insomnia (due to bladder issues). The patient is not nervous/anxious.           Past Medical History:  Diagnosis Date  . 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 Endometrial cancer ([place])    with recurrance at introitus   . History of prediabetes   . 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 ABDOMINAL HYSTERECTOMY    . I&D EXTREMITY Right 02/20/2016   Procedure: IRRIGATION AND DEBRIDEMENT EXTREMITY RIGHT UPPER THIGH;  Surgeon: Fanny Skates, MD;  Location: [facility];  Service: General;  Laterality: Right;  . IRRIGATION AND DEBRIDEMENT ABSCESS Right 02/16/2016   Procedure: IRRIGATION AND DEBRIDEMENT RIGHT GROIN ABSCESS, FULL THICKNESS DEBRIDMENT RIGHT GROIN INCLUDING > 40CM SQUARE.;  Surgeon: Arta Bruce Kinsinger, MD;  Location: [facility];  Service: General;  Laterality: Right;  . IRRIGATION AND DEBRIDEMENT ABSCESS Right 02/20/2016   Procedure: IRRIGATION AND DEBRIDEMENT ABSCESS;  Surgeon: Fanny Skates, MD;  Location: [facility];  Service: General;  Laterality: Right;  Perii-Vaginal Abscess    Family History  Problem Relation Age of Onset  . Hypertension Mother   . Heart disease Father 45    Expired  . Diabetes Neg Hx     Social History:  Wendie Chess  husband and family at home who can assist.  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  She reports that she has never smoked. She has never used smokeless tobacco. She reports that she does not drink alcohol or use drugs.    Allergies: No Known Allergies          Medications Prior to Admission  Medication Sig  Dispense Refill  . celecoxib (CELEBREX) 200 MG capsule Take 200 mg by mouth 2 (two) times daily.     Marland Kitchen HYDROcodone-acetaminophen (NORCO/VICODIN) 5-325 MG tablet Take 1 tablet by mouth every 4 (four) hours as needed for moderate pain.    . nitrofurantoin (MACRODANTIN) 100 MG capsule Take 100 mg by mouth 2 (two) times daily.    . Probiotic Product (PROBIOTIC DAILY PO) Take 1 tablet by mouth daily.       Home: Home Living Family/patient expects to be discharged to:: Private residence Living Arrangements: Spouse/significant other, Children, Other relatives (sister ) Available Help at Discharge: Family, Available 24 hours/day Type of Home: House Home Access: Stairs to enter [employer] of Steps: 4-5 Entrance Stairs-Rails: Right Home Layout: One level Bathroom Shower/Tub: Multimedia programmer: Handicapped height [place]: Environmental consultant - 2 wheels, Grab bars - tub/shower   Functional History: Prior Function Level of Independence: Independent with assistive device(s) Comments: Using RW for ambulation in the last few days PTA. Cooks, cleans.   Functional Status:  Mobility: Bed Mobility Overal bed mobility: Needs Assistance Bed Mobility: Rolling, Supine to Sit, Sit to Supine Rolling: Max assist Sidelying to sit: HOB elevated, Mod assist Supine to sit: Max assist, +2 for physical assistance Sit to supine: Max assist, +2 for physical assistance General bed mobility comments: With supine to sit, pt needed A for R LE and to get trunk upright.  With sit >supine, she needed A to get B LE up onto bed as well as trunk.  Then needed to get scooted to middle of bed.  Pt able to help pull self up in bed with rails with bed in Trendelenberg position. Transfers Overall transfer level: Needs assistance Equipment used: Rolling walker (2 wheeled) Transfers: Sit to/from Stand Sit to Stand: Mod assist, Min assist, +2 physical assistance Stand pivot transfers: Mod assist,  +2 safety/equipment General transfer comment: MOD A of 2 from bed and close to MIN A of 2 from recliner with increased time. Ambulation/Gait Ambulation/Gait assistance: Min assist, +2 safety/equipment Ambulation Distance (Feet): 6 Feet (plus 10') Assistive device: Rolling walker (2 wheeled) Gait Pattern/deviations: Decreased step length - right, Wide base of support, Antalgic, Trunk flexed General Gait Details: Pt ambulated 6' to sink did ADL with HR up to 170, however not a good EKG reading, and pt not showing signs of extreme fatigue or having any subjective reports of heart racing. Discovered one of her leads was off.  Lead placed back on and HR in low 100s. Then ambulated 10' back to bed with 3 standing rest breaks.  MAX HR was 147 with gait and pt was fatigued and wave form looked good. Gait velocity: decreased Gait velocity interpretation: Below normal speed for age/gender    ADL: ADL Overall ADL's : Needs assistance/impaired Eating/Feeding: Independent Grooming: Minimal assistance, Oral care, Standing, Wash/dry face, Sitting Grooming Details (indicate cue type and reason): Min assist for standing balance. Pt with bil forearms resting on edge of sink throughout task. Upper Body Bathing: Moderate assistance, Bed level Lower Body Bathing: Total assistance, Bed level Upper Body Dressing :  Minimal assistance, Sitting Upper Body Dressing Details (indicate cue type and reason): to don/doff hospital gown sitting EOB Lower Body Dressing: Total assistance Lower Body Dressing Details (indicate cue type and reason): Total assist to readjust socks in sitting Toilet Transfer: Moderate assistance, +2 for physical assistance, Ambulation, BSC, RW Toilet Transfer Details (indicate cue type and reason): Simulated by sit to stand from EOB with functional mobility in room. Toileting- Clothing Manipulation and Hygiene: Total assistance, Bed level Functional mobility during ADLs: Minimal assistance, +2  for safety/equipment General ADL Comments: Pt very deconditioned and quick to fatigue. Pt with SOB after a few steps requiring multiple rest breaks with mobility in room. HR up to 146 with activity; returned to low 90s with seated rest break.  Cognition: Cognition Overall Cognitive Status: Within Functional Limits for tasks assessed Orientation Level: Oriented X4 Cognition Arousal/Alertness: Awake/alert Behavior During Therapy: WFL for tasks assessed/performed Overall Cognitive Status: Within Functional Limits for tasks assessed Memory: Decreased short-term memory (pt reported, "my mind isnt what it used to be.")   Blood pressure (!) 100/43, pulse 80, temperature 98.6 F (37 C), temperature source Oral, resp. rate (!) 29, height '5\' 3"'  (1.6 m), weight 135 kg (297 lb 9.6 oz), SpO2 93 %. Physical Exam  Nursing note and vitals reviewed. Constitutional: She is oriented to person, place, and time. She appears well-developed and well-nourished.  Obese female in no distress  HENT:  Head: Normocephalic and atraumatic.  Mouth/Throat: Oropharynx is clear and moist.  Eyes: Conjunctivae and EOM are normal. Pupils are equal, round, and reactive to light.  Neck: Normal range of motion. Neck supple. No tracheal deviation present. No thyromegaly present.  Cardiovascular: Normal rate and regular rhythm.  Exam reveals no gallop and no friction rub.   No murmur heard. Respiratory: Effort normal and breath sounds normal. No stridor. No respiratory distress. She has no wheezes.  GI: Soft. Bowel sounds are normal. She exhibits no distension. There is no tenderness.  Musculoskeletal: Normal range of motion. She exhibits edema.  Neurological: She is alert and oriented to person, place, and time.  Skin: Skin is warm and dry.  Bulky dressing on lower abdomen and upper thigh. Inguinal areas dry with powder in place. Granulation and adipose tissue in open wounds. Areas are tender with palpation/manipulation    Psychiatric: She has a normal mood and affect. Her behavior is normal. Judgment and thought content normal.    Lab Results Last 48 Hours        Results for orders placed or performed during the hospital encounter of 02/14/16 (from the past 48 hour(s))  CBC     Status: Abnormal   Collection Time: 02/20/16 10:07 PM  Result Value Ref Range   WBC 10.2 4.0 - 10.5 K/uL   RBC 2.61 (L) 3.87 - 5.11 MIL/uL   Hemoglobin 8.0 (L) 12.0 - 15.0 g/dL   HCT 24.8 (L) 36.0 - 46.0 %   MCV 95.0 78.0 - 100.0 fL   MCH 30.7 26.0 - 34.0 pg   MCHC 32.3 30.0 - 36.0 g/dL   RDW 15.4 11.5 - 15.5 %   Platelets 324 150 - 400 K/uL  Basic metabolic panel     Status: Abnormal   Collection Time: 02/20/16 10:07 PM  Result Value Ref Range   Sodium 139 135 - 145 mmol/L   Potassium 3.8 3.5 - 5.1 mmol/L   Chloride 104 101 - 111 mmol/L   CO2 28 22 - 32 mmol/L   Glucose, Bld 132 (H) 65 -  99 mg/dL   BUN 6 6 - 20 mg/dL   Creatinine, Ser 0.69 0.44 - 1.00 mg/dL   Calcium 7.6 (L) 8.9 - 10.3 mg/dL   GFR calc non Af Amer >60 >60 mL/min   GFR calc Af Amer >60 >60 mL/min    Comment: (NOTE) The eGFR has been calculated using the CKD EPI equation. This calculation has not been validated in all clinical situations. eGFR's persistently <60 mL/min signify possible Chronic Kidney Disease.   Anion gap 7 5 - 15  CBC     Status: Abnormal   Collection Time: 02/21/16  7:10 AM  Result Value Ref Range   WBC 11.6 (H) 4.0 - 10.5 K/uL   RBC 2.89 (L) 3.87 - 5.11 MIL/uL   Hemoglobin 8.8 (L) 12.0 - 15.0 g/dL   HCT 27.5 (L) 36.0 - 46.0 %   MCV 95.2 78.0 - 100.0 fL   MCH 30.4 26.0 - 34.0 pg   MCHC 32.0 30.0 - 36.0 g/dL   RDW 15.6 (H) 11.5 - 15.5 %   Platelets 379 150 - 400 K/uL  CBC     Status: Abnormal   Collection Time: 02/22/16  6:35 AM  Result Value Ref Range   WBC 10.1 4.0 - 10.5 K/uL   RBC 2.74 (L) 3.87 - 5.11 MIL/uL   Hemoglobin 8.5 (L) 12.0 - 15.0 g/dL   HCT 25.9 (L) 36.0 - 46.0 %    MCV 94.5 78.0 - 100.0 fL   MCH 31.0 26.0 - 34.0 pg   MCHC 32.8 30.0 - 36.0 g/dL   RDW 15.9 (H) 11.5 - 15.5 %   Platelets 340 150 - 400 K/uL  Sedimentation rate     Status: Abnormal   Collection Time: 02/22/16 12:55 PM  Result Value Ref Range   Sed Rate 104 (H) 0 - 22 mm/hr  C-reactive protein     Status: Abnormal   Collection Time: 02/22/16 12:55 PM  Result Value Ref Range   CRP 6.5 (H) <1.0 mg/dL     Imaging Results (Last 48 hours)  No results found.       Medical Problem List and Plan: 1.  Functional and mobility deficits secondary to inguinal/thigh abscess and sepsis with subsequent debility             -admit to inpatient rehab 2.  DVT Prophylaxis/Anticoagulation: Pharmaceutical: Lovenox 3. Pain Management:  Premedicate with oxycodone prior to dressing changes  4. Mood: Motivated to put best effort and get home. LCSW to follow for evaluation and support.  5. Neuropsych: This patient is capable of making decisions on her own behalf. 6. Skin/Wound Care:  Wet to dry dressing changes bid. Husband in process of learning wound care.--continue to educate other family members.  Will likely need to keep foley in to avoid contamination of wound and to help wound heal. Nursing reports needing to change dressing 4-5 times day before foley placed.   7. Fluids/Electrolytes/Nutrition: Monitor I/O. Check lytes in am.  8. Necrotizing fasciitis with right groin abscess s/p I and D: To continue wet to dry dressing changed to right mons/thigh wound. Daily showers recommended. On IV Zosyn/Diflucan D # 17/21  9. Morbid obesity: Encourage appropriate diet and weight loss. Also discussed increasing activity level as patient sedentary. She reports that she has lost 50 lbs in the past 2 years and off BP/DM meds.  Will consult dietician to review weight loss diet.  10. Reactive leucocytosis: Monitor for trend and temp curve.  11.  Stress  induced hyperglycemia: Hgb A1c- 5.6 (10/2015).  Add CM  restrictions to diet.   12.  Critical illness anemia:  11.5-->8.8. Monitor for now.  13. 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 Finally able to sleep at nights (used to get up every 1-2 hours for almost a year)    Post Admission Physician Evaluation: 1. Functional deficits secondary  to debility after sepsis. 2. Patient is admitted to receive collaborative, interdisciplinary care between the physiatrist, rehab nursing staff, and therapy team. 3. Patient's level of medical complexity and substantial therapy needs in context of that medical necessity cannot be provided at a lesser intensity of care such as a SNF. 4. Patient has experienced substantial functional loss from his/her baseline which was documented above under the "Functional History" and "Functional Status" headings.  Judging by the patient's diagnosis, physical exam, and functional history, the patient has potential for functional progress which will result in measurable gains while on inpatient rehab.  These gains will be of substantial and practical use upon discharge  in facilitating mobility and self-care at the household level. 5. Physiatrist will provide 24 hour management of medical needs as well as oversight of the therapy plan/treatment and provide guidance as appropriate regarding the interaction of the two. 6. The Preadmission Screening has been reviewed and patient status is unchanged unless otherwise stated above. 7. 24 hour rehab nursing will assist with bladder management, bowel management, safety, skin/wound care, disease management, medication administration, pain management and patient education  and help integrate therapy concepts, techniques,education, etc. 8. PT will assess and treat for/with: Lower extremity strength, range of motion, stamina, balance, functional mobility, safety, adaptive techniques and equipment, pain mgt, wound care, family education.   Goals are: mod  I. 9. OT will assess and treat for/with: ADL's, functional mobility, safety, upper extremity strength, adaptive techniques and equipment, wound care/mgt, ego support, family education.   Goals are: mod I to supervision. Therapy may not  proceed with showering this patient. 10. SLP will assess and treat for/with: n/a.  Goals are: n/a. 11. Case Management and Social Worker will assess and treat for psychological issues and discharge planning. 12. Team conference will be held weekly to assess progress toward goals and to determine barriers to discharge. 13. Patient will receive at least 3 hours of therapy per day at least 5 days per week. 14. ELOS: 10-15 days       15. Prognosis:  excellent     Meredith Staggers, MD, [facility] 02/22/2016

## 2016-02-22 NOTE — Progress Notes (Signed)
Ankit Lorie Phenix, MD Physician Signed Physical Medicine and Rehabilitation  H&P Date of Service: 02/19/2016 10:48 AM  Related encounter: ED to Hosp-Admission (Current) from 02/14/2016 in [place] [facility]     Expand All Collapse All   [] Hide copied text [] Hover for attribution information      Physical Medicine and Rehabilitation Consult Reason for Consult: Debilitation related to sepsis/multi-medical Referring Physician: [facility]   HPI: Brandy Hardy is a 60 y.o. right hand female with history of cervical cancer with chemotherapy and radiation. Patient lives with spouse independent and recently started using a walker prior to admission. One level home with 4 steps to entry. Spouse to take FMLA to assist. Presented 02/14/2016 with intermittent fevers, generalized weakness, falls, dysuria with suprapubic pain as well as urinary frequency. Patient found to be hypotensive. Hypokalemia 2.4, leukocytosis 20,900 and lactic acid 4.7. Chest x-ray showed no focal airspace consolidation or pulmonary edema. She was given normal saline 4 liter bolus for hypotension as well as receiving Levophed. Findings of a right groin abscess with drainage. Cultures growing gram-positive cocci in pairs and rare gram-negative rods. Placed on broad-spectrum antibiotics for suspected sepsis. General surgery consulted underwent incision and drainage of right thigh abscess full-thickness debridement for necrotizing fasciitis 02/16/2016. Dressing changes as advised. Subcutaneous heparin for DVT prophylaxis. Acute on chronic anemia 8.5 and monitor. Physical therapy evaluation completed 02/19/2016 with recommendations of physical medicine rehabilitation consult.   Review of Systems  HENT: Negative for hearing loss and tinnitus.   Eyes: Negative for blurred vision and double vision.  Respiratory: Negative for cough and shortness of breath.   Cardiovascular: Negative for chest pain, palpitations and  leg swelling.  Gastrointestinal: Positive for constipation. Negative for abdominal pain and vomiting.  Genitourinary: Positive for dysuria and flank pain. Negative for hematuria.  Musculoskeletal: Positive for myalgias.       Patient with recent fall  Skin: Negative for rash.  Neurological: Positive for weakness. Negative for seizures and loss of consciousness.  All other systems reviewed and are negative.      Past Medical History:  Diagnosis Date  . Cancer [facility])    cervical        Past Surgical History:  Procedure Laterality Date  . ABDOMINAL HYSTERECTOMY    . IRRIGATION AND DEBRIDEMENT ABSCESS Right 02/16/2016   Procedure: IRRIGATION AND DEBRIDEMENT RIGHT GROIN ABSCESS, FULL THICKNESS DEBRIDMENT RIGHT GROIN INCLUDING > 40CM SQUARE.;  Surgeon: Arta Bruce Kinsinger, MD;  Location: [facility];  Service: General;  Laterality: Right;         Family History  Problem Relation Age of Onset  . Hypertension Mother   . Heart disease Father 58    Expired  . Diabetes Neg Hx    Social History:  reports that she has never smoked. She has never used smokeless tobacco. She reports that she does not drink alcohol or use drugs. Allergies: No Known Allergies       Medications Prior to Admission  Medication Sig Dispense Refill  . celecoxib (CELEBREX) 200 MG capsule Take 200 mg by mouth 2 (two) times daily.     Marland Kitchen HYDROcodone-acetaminophen (NORCO/VICODIN) 5-325 MG tablet Take 1 tablet by mouth every 4 (four) hours as needed for moderate pain.    . nitrofurantoin (MACRODANTIN) 100 MG capsule Take 100 mg by mouth 2 (two) times daily.    . Probiotic Product (PROBIOTIC DAILY PO) Take 1 tablet by mouth daily.       Home: Home Living Family/patient  expects to be discharged to:: Private residence Living Arrangements: Spouse/significant other Available Help at Discharge: Family, Available 24 hours/day (Spouse took FMLA) Type of Home: House Home Access: Stairs to enter Engineer, site of Steps: 4-5 Entrance Stairs-Rails: Right Home Layout: One level Home Equipment: Walker - 2 wheels  Functional History: Prior Function Level of Independence: Independent with assistive device(s) Comments: Using RW for ambulation in the last few days PTA. Cooks, cleans.  Functional Status:  Mobility: Bed Mobility Overal bed mobility: Needs Assistance Bed Mobility: Rolling, Sidelying to Sit Rolling: Mod assist Sidelying to sit: Max assist, HOB elevated General bed mobility comments: Step by step cues for technique, heavy use of rail, cues for log roll. Assist with RLE, and to elevate trunk to sitting. Transfers Overall transfer level: Needs assistance Equipment used: Rolling walker (2 wheeled) Transfers: Sit to/from Stand, [employer] Transfers Sit to Stand: Mod assist, +2 physical assistance Stand pivot transfers: Mod assist, +2 safety/equipment General transfer comment: Assist of 2 to boost to standing with cues for technique and hand placement. Wide BoS and difficulty getting LEs within RW. Stood from EOB and from [facility]. SPT bed to [facility] and BSC to chair.      ADL:    Cognition: Cognition Overall Cognitive Status: Within Functional Limits for tasks assessed Orientation Level: Oriented X4 Cognition Arousal/Alertness: Awake/alert Behavior During Therapy: WFL for tasks assessed/performed Overall Cognitive Status: Within Functional Limits for tasks assessed Memory: Decreased short-term memory (pt reported, "my mind isnt what it used to be.")  Blood pressure 117/61, pulse 86, temperature 98.4 F (36.9 C), temperature source Oral, resp. rate 20, height 5\' 3"  (1.6 m), weight (!) 136.4 kg (300 lb 9.6 oz), SpO2 94 %. Physical Exam  Vitals reviewed. Constitutional: She is oriented to person, place, and time. She appears well-developed.  Obese  HENT:  Head: Normocephalic and atraumatic.  Eyes: Conjunctivae and EOM are normal.  Neck: Normal range of motion. Neck  supple. No thyromegaly present.  Cardiovascular: Normal rate and regular rhythm.   Respiratory: Effort normal and breath sounds normal. No respiratory distress.  GI: Soft. Bowel sounds are normal. She exhibits no distension.  Musculoskeletal:  Edema and tenderness in RLE  Neurological: She is alert and oriented to person, place, and time.  Sensation intact to light touch Motor: B/le UE 4+/5 proximal to distal B/l LE: HF 2/5, KE 4-/5, ADF/PF 4+/5  Skin: Skin is warm and dry.  Groin abscess is dressed  Psychiatric: She has a normal mood and affect. Her behavior is normal.    Lab Results Last 24 Hours       Results for orders placed or performed during the hospital encounter of 02/14/16 (from the past 24 hour(s))  CBC     Status: Abnormal   Collection Time: 02/18/16 11:09 AM  Result Value Ref Range   WBC 15.8 (H) 4.0 - 10.5 K/uL   RBC 2.92 (L) 3.87 - 5.11 MIL/uL   Hemoglobin 8.7 (L) 12.0 - 15.0 g/dL   HCT 27.3 (L) 36.0 - 46.0 %   MCV 93.5 78.0 - 100.0 fL   MCH 29.8 26.0 - 34.0 pg   MCHC 31.9 30.0 - 36.0 g/dL   RDW 15.0 11.5 - 15.5 %   Platelets 283 150 - 400 K/uL  Comprehensive metabolic panel     Status: Abnormal   Collection Time: 02/18/16 11:09 AM  Result Value Ref Range   Sodium 138 135 - 145 mmol/L   Potassium 3.7 3.5 - 5.1 mmol/L  Chloride 103 101 - 111 mmol/L   CO2 29 22 - 32 mmol/L   Glucose, Bld 123 (H) 65 - 99 mg/dL   BUN 7 6 - 20 mg/dL   Creatinine, Ser 0.59 0.44 - 1.00 mg/dL   Calcium 7.1 (L) 8.9 - 10.3 mg/dL   Total Protein 4.0 (L) 6.5 - 8.1 g/dL   Albumin 1.0 (L) 3.5 - 5.0 g/dL   AST 43 (H) 15 - 41 U/L   ALT 33 14 - 54 U/L   Alkaline Phosphatase 86 38 - 126 U/L   Total Bilirubin 0.6 0.3 - 1.2 mg/dL   GFR calc non Af Amer >60 >60 mL/min   GFR calc Af Amer >60 >60 mL/min   Anion gap 6 5 - 15  Magnesium     Status: None   Collection Time: 02/18/16 11:09 AM  Result Value Ref Range   Magnesium 1.8 1.7 - 2.4 mg/dL  Basic  metabolic panel     Status: Abnormal   Collection Time: 02/19/16  4:10 AM  Result Value Ref Range   Sodium 138 135 - 145 mmol/L   Potassium 4.4 3.5 - 5.1 mmol/L   Chloride 104 101 - 111 mmol/L   CO2 27 22 - 32 mmol/L   Glucose, Bld 103 (H) 65 - 99 mg/dL   BUN 7 6 - 20 mg/dL   Creatinine, Ser 0.62 0.44 - 1.00 mg/dL   Calcium 7.3 (L) 8.9 - 10.3 mg/dL   GFR calc non Af Amer >60 >60 mL/min   GFR calc Af Amer >60 >60 mL/min   Anion gap 7 5 - 15  CBC     Status: Abnormal   Collection Time: 02/19/16  4:10 AM  Result Value Ref Range   WBC 13.4 (H) 4.0 - 10.5 K/uL   RBC 2.80 (L) 3.87 - 5.11 MIL/uL   Hemoglobin 8.5 (L) 12.0 - 15.0 g/dL   HCT 26.4 (L) 36.0 - 46.0 %   MCV 94.3 78.0 - 100.0 fL   MCH 30.4 26.0 - 34.0 pg   MCHC 32.2 30.0 - 36.0 g/dL   RDW 15.5 11.5 - 15.5 %   Platelets 280 150 - 400 K/uL     Imaging Results (Last 48 hours)  No results found.    Assessment/Plan: Diagnosis: Debilitation  Labs and images independently reviewed.  Records reviewed and summated above.  1. Does the need for close, 24 hr/day medical supervision in concert with the patient's rehab needs make it unreasonable for this patient to be served in a less intensive setting? Yes  2. Co-Morbidities requiring supervision/potential complications: cervical cancer with chemotherapy and radiation, Acute on chronic anemia (transfuse if necessary to ensure appropriate perfusion for increased activity tolerance), tachypnea (monitor RR and O2 Sats with increased physical exertion), leukocytosis (cont to monitor for signs and symptoms of infection, further workup if indicated, trending down), Right groin abscess (narrow abx- currently Vanc/Zosyn, as cultures finalize-monitor Vanc levels for time being), super obese (Body mass index is 53.25 kg/m., Diet and exercise education, encourage weight loss to increase endurance and promote overall health) 3. Due to safety, skin/wound care, disease  management, pain management and patient education, does the patient require 24 hr/day rehab nursing? Yes 4. Does the patient require coordinated care of a physician, rehab nurse, PT (1-2 hrs/day, 5 days/week) and OT (1-2 hrs/day, 5 days/week) to address physical and functional deficits in the context of the above medical diagnosis(es)? Yes Addressing deficits in the following areas: balance, endurance, locomotion, strength,  transferring, bathing, dressing, toileting and psychosocial support 5. Can the patient actively participate in an intensive therapy program of at least 3 hrs of therapy per day at least 5 days per week? Yes 6. The potential for patient to make measurable gains while on inpatient rehab is excellent 7. Anticipated functional outcomes upon discharge from inpatient rehab are supervision and min assist  with PT, supervision and min assist with OT, n/a with SLP. 8. Estimated rehab length of stay to reach the above functional goals is: 14-17 days. 9. Does the patient have adequate social supports and living environment to accommodate these discharge functional goals? Yes 10. Anticipated D/C setting: Home 11. Anticipated post D/C treatments: HH therapy and Home excercise program 12. Overall Rehab/Functional Prognosis: excellent  RECOMMENDATIONS: This patient's condition is appropriate for continued rehabilitative care in the following setting: CIR Patient has agreed to participate in recommended program. Yes Note that insurance prior authorization may be required for reimbursement for recommended care.  Comment: Rehab Admissions Coordinator to follow up.  Delice Lesch, MD, [facility] 02/19/2016    Revision History                        Routing History

## 2016-02-22 NOTE — Progress Notes (Signed)
2 Days Post-Op  Subjective: Slept better last night Dressing changes went well yesterday.  Wound was clean without odor. She has the Foley catheter and she likes having that since it allows her to rest better without urinary frequency  Possibly transferred to CIR today.  Objective: Vital signs in last 24 hours: Temp:  [98.3 F (36.8 C)-98.6 F (37 C)] 98.6 F (37 C) (11/17 0500) Pulse Rate:  [80-84] 80 (11/17 0500) Resp:  [25-29] 29 (11/17 0500) BP: (111-113)/(50-53) 113/50 (11/17 0500) SpO2:  [92 %-94 %] 92 % (11/17 0500) Weight:  [135 kg (297 lb 9.6 oz)] 135 kg (297 lb 9.6 oz) (11/17 0500) Last BM Date: 02/21/16  Intake/Output from previous day: 11/16 0701 - 11/17 0700 In: 890 [P.O.:240; IV Piggyback:650] Out: 1800 [Urine:1800] Intake/Output this shift: Total I/O In: 890 [P.O.:240; IV Piggyback:650] Out: 1800 [Urine:1800]   EXAM: General appearance: Alert.  Cooperative.  No distress. GI: soft, non-tender; bowel sounds normal; no masses,  no organomegaly Incision/Wound: Right groin and right thigh dressings clean and dry.  No odor or bleeding.  Lab Results:  Results for orders placed or performed during the hospital encounter of 02/14/16 (from the past 24 hour(s))  CBC     Status: Abnormal   Collection Time: 02/21/16  7:10 AM  Result Value Ref Range   WBC 11.6 (H) 4.0 - 10.5 K/uL   RBC 2.89 (L) 3.87 - 5.11 MIL/uL   Hemoglobin 8.8 (L) 12.0 - 15.0 g/dL   HCT 27.5 (L) 36.0 - 46.0 %   MCV 95.2 78.0 - 100.0 fL   MCH 30.4 26.0 - 34.0 pg   MCHC 32.0 30.0 - 36.0 g/dL   RDW 15.6 (H) 11.5 - 15.5 %   Platelets 379 150 - 400 K/uL     Studies/Results: No results found.  Marland Kitchen atorvastatin  40 mg Oral Daily  . Chlorhexidine Gluconate Cloth  6 each Topical Once  . feeding supplement (ENSURE ENLIVE)  237 mL Oral TID WC  . fluconazole  200 mg Oral Daily  . heparin  5,000 Units Subcutaneous Q8H  . multivitamin with minerals  1 tablet Oral Daily  . pantoprazole  40 mg Oral  Daily  . piperacillin-tazobactam (ZOSYN)  IV  3.375 g Intravenous Q8H  . senna  1 tablet Oral BID  . vancomycin  1,250 mg Intravenous Q12H     Assessment/Plan: s/p Procedure(s): IRRIGATION AND DEBRIDEMENT EXTREMITY RIGHT UPPER THIGH IRRIGATION AND DEBRIDEMENT ABSCESS  1. S/p incision and drainage of right thigh abscess, full thickness debridement of 40cm^2 right groin for necrotizing fasciitis11/11 Dr. Kieth Brightly 2. Debridement of skin subcutaneous tissue and fascia right groin wound; Debridement to be monitored skin subcutaneous tissue of right thigh wound 11/15 Dr. Dalbert Batman - 11/11 culture: MIXED ANAEROBIC FLORA PRESENT - WBC 11.6, afebrile 3.  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  I told her that we would like to get this [place] is possible.  His lower quadrant voiding trial, or at most urology consult for urodynamics. 4.  History cervical cancer, TAH/BSO, more than 1 course of radiation therapy.  This is the likely source of her periurethral ulcers, which she states have been there for a long time.  This will also impair wound healing.  ID - zosyn 11/9>>, vanc 11/9>>, diflucan 11/9>> FEN - heart healthy VTE - heparin  Plan - okay to transfer to CIR             We will plan to follow and  assist with wound management while on rehabilitation             For urinary retention, she will need voiding trial, or possible urology consult             Continue PT.  Needs encouragement for mobilization.              Continue BID wet to dry packing/dressing changes, will not be able to place wound vac.             Continue antibiotics (day 8).             Encourage patient to get up in chair/out of bed, and daily showers. Continue PT  @PROBHOSP @  LOS: 8 days    Vignesh Willert M 02/22/2016  . .prob

## 2016-02-22 NOTE — Progress Notes (Signed)
ANTIBIOTIC CONSULT NOTE - INITIAL  Pharmacy Consult for Zosyn Indication: pelvic abscess with nectrotizing fascitis   No Known Allergies  Patient Measurements: Height: 5\' 3"  (160 cm) Weight: 298 lb 15.1 oz (135.6 kg) IBW/kg (Calculated) : 52.4 Adjusted Body Weight:    Vital Signs: Temp: 98.6 F (37 C) (11/17 1822) Temp Source: Oral (11/17 1822) BP: 129/55 (11/17 1822) Pulse Rate: 83 (11/17 1822) Intake/Output from previous day: No intake/output data recorded. Intake/Output from this shift: Total I/O In: 240 [P.O.:240] Out: 350 [Urine:350]  Labs:  Recent Labs  02/20/16 2207 02/21/16 0710 02/22/16 0635  WBC 10.2 11.6* 10.1  HGB 8.0* 8.8* 8.5*  PLT 324 379 340  CREATININE 0.69  --   --    Estimated Creatinine Clearance: 101.2 mL/min (by C-G formula based on SCr of 0.69 mg/dL). No results for input(s): VANCOTROUGH, VANCOPEAK, VANCORANDOM, GENTTROUGH, GENTPEAK, GENTRANDOM, TOBRATROUGH, TOBRAPEAK, TOBRARND, AMIKACINPEAK, AMIKACINTROU, AMIKACIN in the last 72 hours.   Microbiology:   Medical History: Past Medical History:  Diagnosis Date  . 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 Endometrial cancer ([place])    with recurrance at introitus   . History of prediabetes   . 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 Pharmacy has been consulted for Vancomycin/Zosyndosing. Presents to ED after a fall, found to be hypotensive, WBC elevated, Scr 1.14, lactic acid elevated, other labs reviewed.   PMH: cervical CA  ID: pelvic abscess with nectrotizing fascitis post I&D on 11/11; fluconazole for suspected urogenital candidiasis. Right thigh I&D on 11/15, clean wounds w/o purulence. Drains in place. -Recent Dx of UTI (macrobid PTA) -Wet prep and KOH unattainable so fluconazole was continued by critical care team -WBC down 10.1 (from admission of 20.9) -Afebrile  >24h -LA 4.7>>1.6>>1.2 -SCr down to 0.62  Antimicrobials this admission:  11/9 vanc >>11/10, 11/11 >> 11/17 11/9 zosyn >>  (plan 3 weeks with 11/12 as day 1 per Dr. Snider)>> 11/9 fluconazole >>  Microbiology results:  11/9 MRSA - neg 11/9 blood x2 - 1/2 Bacteroides beta-lactamase positive 11/9 urine - insignificant growth 11/11 wound abscess groin - gram+ cocci in pairs, gram- rods >> mixed anaerobic flora FINAL 11/16 Groin abscess:MIXED ANAEROBIC FLORA PRESENT.  Goal of Therapy:  Eradication of infection  Plan:  Continue Zosyn 3.375g IV q8hr. Pharmacy will sign off. Please reconsult for further dosing assitance.   Abrahim Sargent S. Alford Highland, PharmD, [place] Clinical Staff Pharmacist Pager [phone]  [place], [place] 02/22/2016,6:58 PM

## 2016-02-22 NOTE — Progress Notes (Signed)
[facility]   Mrs. Strimbui is  a new pt for [facility] this hospitalization.    [facility] Infusion Coordinator will follow pt for  Home IV ABX if needed upon DC to home.   Services provided by [facility]:  Home Infusion Pharmacy team.  [facility] will partner with patient's home health agency of choice to provide home care needs.   If patient discharges after hours, please call [phone].   Larry Sierras 02/22/2016, 2:58 PM

## 2016-02-22 NOTE — Progress Notes (Deleted)
Ankit Lorie Phenix, MD Physician Signed Physical Medicine and Rehabilitation  H&P Date of Service: 02/19/2016 10:48 AM    Expand All Collapse All   [] Hide copied text [] Hover for attribution information      Physical Medicine and Rehabilitation Consult Reason for Consult: Debilitation related to sepsis/multi-medical Referring Physician: [facility]   HPI: Brandy Hardy is a 60 y.o. right hand female with history of cervical cancer with chemotherapy and radiation. Patient lives with spouse independent and recently started using a walker prior to admission. One level home with 4 steps to entry. Spouse to take FMLA to assist. Presented 02/14/2016 with intermittent fevers, generalized weakness, falls, dysuria with suprapubic pain as well as urinary frequency. Patient found to be hypotensive. Hypokalemia 2.4, leukocytosis 20,900 and lactic acid 4.7. Chest x-ray showed no focal airspace consolidation or pulmonary edema. She was given normal saline 4 liter bolus for hypotension as well as receiving Levophed. Findings of a right groin abscess with drainage. Cultures growing gram-positive cocci in pairs and rare gram-negative rods. Placed on broad-spectrum antibiotics for suspected sepsis. General surgery consulted underwent incision and drainage of right thigh abscess full-thickness debridement for necrotizing fasciitis 02/16/2016. Dressing changes as advised. Subcutaneous heparin for DVT prophylaxis. Acute on chronic anemia 8.5 and monitor. Physical therapy evaluation completed 02/19/2016 with recommendations of physical medicine rehabilitation consult.   Review of Systems  HENT: Negative for hearing loss and tinnitus.   Eyes: Negative for blurred vision and double vision.  Respiratory: Negative for cough and shortness of breath.   Cardiovascular: Negative for chest pain, palpitations and leg swelling.  Gastrointestinal: Positive for constipation. Negative for abdominal pain and vomiting.    Genitourinary: Positive for dysuria and flank pain. Negative for hematuria.  Musculoskeletal: Positive for myalgias.       Patient with recent fall  Skin: Negative for rash.  Neurological: Positive for weakness. Negative for seizures and loss of consciousness.  All other systems reviewed and are negative.      Past Medical History:  Diagnosis Date  . Cancer [facility])    cervical        Past Surgical History:  Procedure Laterality Date  . ABDOMINAL HYSTERECTOMY    . IRRIGATION AND DEBRIDEMENT ABSCESS Right 02/16/2016   Procedure: IRRIGATION AND DEBRIDEMENT RIGHT GROIN ABSCESS, FULL THICKNESS DEBRIDMENT RIGHT GROIN INCLUDING > 40CM SQUARE.;  Surgeon: Arta Bruce Kinsinger, MD;  Location: [place];  Service: General;  Laterality: Right;         Family History  Problem Relation Age of Onset  . Hypertension Mother   . Heart disease Father 28    Expired  . Diabetes Neg Hx    Social History:  reports that she has never smoked. She has never used smokeless tobacco. She reports that she does not drink alcohol or use drugs. Allergies: No Known Allergies       Medications Prior to Admission  Medication Sig Dispense Refill  . celecoxib (CELEBREX) 200 MG capsule Take 200 mg by mouth 2 (two) times daily.     Marland Kitchen HYDROcodone-acetaminophen (NORCO/VICODIN) 5-325 MG tablet Take 1 tablet by mouth every 4 (four) hours as needed for moderate pain.    . nitrofurantoin (MACRODANTIN) 100 MG capsule Take 100 mg by mouth 2 (two) times daily.    . Probiotic Product (PROBIOTIC DAILY PO) Take 1 tablet by mouth daily.       Home: Home Living Family/patient expects to be discharged to:: Private residence Living Arrangements: Spouse/significant other Available Help at Discharge: Family, Available  24 hours/day (Spouse took FMLA) Type of Home: House Home Access: Stairs to enter [employer] of Steps: 4-5 Entrance Stairs-Rails: Right Home Layout: One level Home Equipment:  Walker - 2 wheels  Functional History: Prior Function Level of Independence: Independent with assistive device(s) Comments: Using RW for ambulation in the last few days PTA. Cooks, cleans.  Functional Status:  Mobility: Bed Mobility Overal bed mobility: Needs Assistance Bed Mobility: Rolling, Sidelying to Sit Rolling: Mod assist Sidelying to sit: Max assist, HOB elevated General bed mobility comments: Step by step cues for technique, heavy use of rail, cues for log roll. Assist with RLE, and to elevate trunk to sitting. Transfers Overall transfer level: Needs assistance Equipment used: Rolling walker (2 wheeled) Transfers: Sit to/from Stand, [employer] Transfers Sit to Stand: Mod assist, +2 physical assistance Stand pivot transfers: Mod assist, +2 safety/equipment General transfer comment: Assist of 2 to boost to standing with cues for technique and hand placement. Wide BoS and difficulty getting LEs within RW. Stood from EOB and from [facility]. SPT bed to [facility] and BSC to chair.      ADL:    Cognition: Cognition Overall Cognitive Status: Within Functional Limits for tasks assessed Orientation Level: Oriented X4 Cognition Arousal/Alertness: Awake/alert Behavior During Therapy: WFL for tasks assessed/performed Overall Cognitive Status: Within Functional Limits for tasks assessed Memory: Decreased short-term memory (pt reported, "my mind isnt what it used to be.")  Blood pressure 117/61, pulse 86, temperature 98.4 F (36.9 C), temperature source Oral, resp. rate 20, height 5\' 3"  (1.6 m), weight (!) 136.4 kg (300 lb 9.6 oz), SpO2 94 %. Physical Exam  Vitals reviewed. Constitutional: She is oriented to person, place, and time. She appears well-developed.  Obese  HENT:  Head: Normocephalic and atraumatic.  Eyes: Conjunctivae and EOM are normal.  Neck: Normal range of motion. Neck supple. No thyromegaly present.  Cardiovascular: Normal rate and regular rhythm.   Respiratory:  Effort normal and breath sounds normal. No respiratory distress.  GI: Soft. Bowel sounds are normal. She exhibits no distension.  Musculoskeletal:  Edema and tenderness in RLE  Neurological: She is alert and oriented to person, place, and time.  Sensation intact to light touch Motor: B/le UE 4+/5 proximal to distal B/l LE: HF 2/5, KE 4-/5, ADF/PF 4+/5  Skin: Skin is warm and dry.  Groin abscess is dressed  Psychiatric: She has a normal mood and affect. Her behavior is normal.    Lab Results Last 24 Hours       Results for orders placed or performed during the hospital encounter of 02/14/16 (from the past 24 hour(s))  CBC     Status: Abnormal   Collection Time: 02/18/16 11:09 AM  Result Value Ref Range   WBC 15.8 (H) 4.0 - 10.5 K/uL   RBC 2.92 (L) 3.87 - 5.11 MIL/uL   Hemoglobin 8.7 (L) 12.0 - 15.0 g/dL   HCT 27.3 (L) 36.0 - 46.0 %   MCV 93.5 78.0 - 100.0 fL   MCH 29.8 26.0 - 34.0 pg   MCHC 31.9 30.0 - 36.0 g/dL   RDW 15.0 11.5 - 15.5 %   Platelets 283 150 - 400 K/uL  Comprehensive metabolic panel     Status: Abnormal   Collection Time: 02/18/16 11:09 AM  Result Value Ref Range   Sodium 138 135 - 145 mmol/L   Potassium 3.7 3.5 - 5.1 mmol/L   Chloride 103 101 - 111 mmol/L   CO2 29 22 - 32 mmol/L  Glucose, Bld 123 (H) 65 - 99 mg/dL   BUN 7 6 - 20 mg/dL   Creatinine, Ser 0.59 0.44 - 1.00 mg/dL   Calcium 7.1 (L) 8.9 - 10.3 mg/dL   Total Protein 4.0 (L) 6.5 - 8.1 g/dL   Albumin 1.0 (L) 3.5 - 5.0 g/dL   AST 43 (H) 15 - 41 U/L   ALT 33 14 - 54 U/L   Alkaline Phosphatase 86 38 - 126 U/L   Total Bilirubin 0.6 0.3 - 1.2 mg/dL   GFR calc non Af Amer >60 >60 mL/min   GFR calc Af Amer >60 >60 mL/min   Anion gap 6 5 - 15  Magnesium     Status: None   Collection Time: 02/18/16 11:09 AM  Result Value Ref Range   Magnesium 1.8 1.7 - 2.4 mg/dL  Basic metabolic panel     Status: Abnormal   Collection Time: 02/19/16  4:10 AM  Result Value Ref Range    Sodium 138 135 - 145 mmol/L   Potassium 4.4 3.5 - 5.1 mmol/L   Chloride 104 101 - 111 mmol/L   CO2 27 22 - 32 mmol/L   Glucose, Bld 103 (H) 65 - 99 mg/dL   BUN 7 6 - 20 mg/dL   Creatinine, Ser 0.62 0.44 - 1.00 mg/dL   Calcium 7.3 (L) 8.9 - 10.3 mg/dL   GFR calc non Af Amer >60 >60 mL/min   GFR calc Af Amer >60 >60 mL/min   Anion gap 7 5 - 15  CBC     Status: Abnormal   Collection Time: 02/19/16  4:10 AM  Result Value Ref Range   WBC 13.4 (H) 4.0 - 10.5 K/uL   RBC 2.80 (L) 3.87 - 5.11 MIL/uL   Hemoglobin 8.5 (L) 12.0 - 15.0 g/dL   HCT 26.4 (L) 36.0 - 46.0 %   MCV 94.3 78.0 - 100.0 fL   MCH 30.4 26.0 - 34.0 pg   MCHC 32.2 30.0 - 36.0 g/dL   RDW 15.5 11.5 - 15.5 %   Platelets 280 150 - 400 K/uL     Imaging Results (Last 48 hours)  No results found.    Assessment/Plan: Diagnosis: Debilitation  Labs and images independently reviewed.  Records reviewed and summated above.  1. Does the need for close, 24 hr/day medical supervision in concert with the patient's rehab needs make it unreasonable for this patient to be served in a less intensive setting? Yes  2. Co-Morbidities requiring supervision/potential complications: cervical cancer with chemotherapy and radiation, Acute on chronic anemia (transfuse if necessary to ensure appropriate perfusion for increased activity tolerance), tachypnea (monitor RR and O2 Sats with increased physical exertion), leukocytosis (cont to monitor for signs and symptoms of infection, further workup if indicated, trending down), Right groin abscess (narrow abx- currently Vanc/Zosyn, as cultures finalize-monitor Vanc levels for time being), super obese (Body mass index is 53.25 kg/m., Diet and exercise education, encourage weight loss to increase endurance and promote overall health) 3. Due to safety, skin/wound care, disease management, pain management and patient education, does the patient require 24 hr/day rehab nursing?  Yes 4. Does the patient require coordinated care of a physician, rehab nurse, PT (1-2 hrs/day, 5 days/week) and OT (1-2 hrs/day, 5 days/week) to address physical and functional deficits in the context of the above medical diagnosis(es)? Yes Addressing deficits in the following areas: balance, endurance, locomotion, strength, transferring, bathing, dressing, toileting and psychosocial support 5. Can the patient actively participate in an intensive  therapy program of at least 3 hrs of therapy per day at least 5 days per week? Yes 6. The potential for patient to make measurable gains while on inpatient rehab is excellent 7. Anticipated functional outcomes upon discharge from inpatient rehab are supervision and min assist  with PT, supervision and min assist with OT, n/a with SLP. 8. Estimated rehab length of stay to reach the above functional goals is: 14-17 days. 9. Does the patient have adequate social supports and living environment to accommodate these discharge functional goals? Yes 10. Anticipated D/C setting: Home 11. Anticipated post D/C treatments: HH therapy and Home excercise program 12. Overall Rehab/Functional Prognosis: excellent  RECOMMENDATIONS: This patient's condition is appropriate for continued rehabilitative care in the following setting: CIR Patient has agreed to participate in recommended program. Yes Note that insurance prior authorization may be required for reimbursement for recommended care.  Comment: Rehab Admissions Coordinator to follow up.  Delice Lesch, MD, [facility] 02/19/2016    Revision History                        Routing History

## 2016-02-22 NOTE — Progress Notes (Signed)
[facility] Admission Coordinator Signed Physical Medicine and Rehabilitation  PMR Pre-admission Date of Service: 02/22/2016 1:17 PM  Related encounter: ED to Hosp-Admission (Current) from 02/14/2016 in [facility]       [] Hide copied text PMR Admission Coordinator Pre-Admission Assessment  Patient: Brandy Hardy is an 60 y.o., female MRN: [id number] DOB: Apr 27, 1955 Height: 5\' 3"  (160 cm) Weight: 135 kg (297 lb 9.6 oz)                                                                                                                                                  Insurance Information HMO:     PPO:      PCP:      IPA:      80/20:      OTHER: X PRIMARY: [employer]      Policy#: [id number]      Subscriber: Self  CM Name: Dossie Der      Phone#: [phone]     Fax#: [phone] Pre-Cert#: [id number]      Employer: full time  Benefits:  Phone #: [phone]     Name: Mila Merry. Date: 04/08/15     Deduct: $500      Out of Pocket Max: $6,850      Life Max: none CIR: 80% covered after deductible       SNF: 80% covered after deductible Outpatient: PT/OT       Co-Pay: 20% after deductible, 90 combined visits a year  Home Health: PT/OT      Co-Pay: 20% after deductible DME: 80%      Co-Pay: 20% after deductible Providers: in-network   Medicaid Application Date:       Case Manager:  Disability Application Date:       Case Worker:   Emergency Contact Information        Contact Information    Name Relation Home Work Mobile   Laskin,Charles Spouse [phone]       Current Medical History  Patient Admitting Diagnosis: Debilitation   History of Present Illness: Brandy Hardy is a 60 year old female with history of recurrent endometrial cancer s/p hysterectomy and XRT, morbid obesity, recent UTI who was admitted on 02/14/16 with weakness, poor po intake, abdominal pain and near syncope. She was noted to be dehydrated and hypotensive due to septic  shock. She was treated with fluid bolus, IV antibiotics and levophed. CT abdomen showed areas of subcutaneous cellulitis with early abscess formation and air with inflammatory changes tracing into bilateral upper thigh musculature. She developed right groin drainage due to abscess. 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 Sheliah Hatch on 11/11. She continued to have copious malodorous drainage from wound and was taken back to OR  on 11/15 for repeat I and D and was found to have urinary retention during placement of foley catheter. Blood culture X 2 positive for beta lactamase positive Bacteriodes fragilis. Wound culture with mixed flora. To continue bid dressing as will not be able to place VAC and continue IV antibiotics per CCS. Therapy evaluations done showing patient limited by generalized weakness and difficulty mobilizing due to pain and body habitus. CIR recommended for follow up therapy and patient admitted 02/22/16.       Past Medical History      Past Medical History:  Diagnosis Date  . Cervical adenocarcinoma (HCC)    stage II  . Endometrial cancer (HCC)    with recurrance at introitus   . History of prediabetes   . 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  Prior Rehab/Hospitalizations:  Has the patient had major surgery during 100 days prior to admission? No  Current Medications   Current Facility-Administered Medications:  .  acetaminophen (TYLENOL) tablet 650 mg, 650 mg, Oral, Q6H PRN, Alyson Reedy, MD, 650 mg at 02/22/16 0808 .  atorvastatin (LIPITOR) tablet 40 mg, 40 mg, Oral, Daily, Claud Kelp, MD, 40 mg at 02/22/16 0808 .  6 CHG cloth bath night before surgery, , , Once **AND** 6 CHG cloth bath AM of surgery, , , Once  **AND** Chlorhexidine Gluconate Cloth 2 % PADS 6 each, 6 each, Topical, Once **AND** [COMPLETED] Chlorhexidine Gluconate Cloth 2 % PADS 6 each, 6 each, Topical, Once, Claud Kelp, MD, 6 each at 02/20/16 0751 .  feeding supplement (ENSURE ENLIVE) (ENSURE ENLIVE) liquid 237 mL, 237 mL, Oral, TID WC, Richarda Overlie, MD, 237 mL at 02/19/16 1700 .  fluconazole (DIFLUCAN) tablet 200 mg, 200 mg, Oral, Daily, Jenita Seashore, RPH, 200 mg at 02/22/16 0807 .  heparin injection 5,000 Units, 5,000 Units, Subcutaneous, Q8H, Duayne Cal, NP, 5,000 Units at 02/22/16 [phone] .  methocarbamol (ROBAXIN) tablet 500 mg, 500 mg, Oral, Q8H PRN, De Blanch Kinsinger, MD .  morphine 2 MG/ML injection 2-4 mg, 2-4 mg, Intravenous, Q2H PRN, Rodman Pickle, MD, 2 mg at 02/22/16 0807 .  multivitamin with minerals tablet 1 tablet, 1 tablet, Oral, Daily, Richarda Overlie, MD, 1 tablet at 02/22/16 0808 .  ondansetron (ZOFRAN-ODT) disintegrating tablet 4 mg, 4 mg, Oral, Q6H PRN **OR** ondansetron (ZOFRAN) injection 4 mg, 4 mg, Intravenous, Q6H PRN, Claud Kelp, MD .  oxyCODONE (Oxy IR/ROXICODONE) immediate release tablet 5 mg, 5 mg, Oral, Q6H PRN, Rodman Pickle, MD, 5 mg at 02/19/16 0543 .  pantoprazole (PROTONIX) EC tablet 40 mg, 40 mg, Oral, Daily, Duayne Cal, NP, 40 mg at 02/22/16 0807 .  piperacillin-tazobactam (ZOSYN) IVPB 3.375 g, 3.375 g, Intravenous, Q8H, Stevphen Rochester, RPH, 3.375 g at 02/22/16 0629 .  senna (SENOKOT) tablet 8.6 mg, 1 tablet, Oral, BID, Claud Kelp, MD, 8.6 mg at 02/22/16 0808 .  sodium chloride flush (NS) 0.9 % injection 10-40 mL, 10-40 mL, Intracatheter, PRN, Lonia Blood, MD .  sodium chloride flush (NS) 0.9 % injection 10-40 mL, 10-40 mL, Intracatheter, PRN, Elease Etienne, MD  Patients Current Diet: Diet Heart Room service appropriate? 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  She would drive and go out on the weekends to run errands.     Home Assistive Devices / Equipment Home Assistive Devices/Equipment: Dan Humphreys (specify type) Home Equipment: Walker - 2 wheels, Grab bars - tub/shower  Prior Device Use: Indicate devices/aids used by the patient prior to current illness, exacerbation or injury? Used a walker just prior to admission due to pain   Prior Functional Level Prior Function Level of Independence: Independent with assistive device(s) Comments: Using RW for ambulation in the last few days PTA. Cooks, cleans.   Self Care: Did the patient need help bathing, dressing, using the toilet or eating?  Independent  Indoor Mobility: Did the patient need assistance with walking from room to room (with or without device)? Independent  Stairs: Did the patient need assistance with internal or external stairs (with or without device)? Independent  Functional Cognition: Did the patient need help planning regular tasks such as shopping or remembering to take medications? Independent  Current Functional Level Cognition Overall Cognitive Status: Within Functional Limits for tasks assessed Orientation Level: Oriented X4    Extremity Assessment (includes Sensation/Coordination) Upper Extremity Assessment: Generalized weakness  Lower Extremity Assessment: Defer to PT evaluation   ADLs Overall ADL's : Needs assistance/impaired Eating/Feeding: Independent Grooming: Minimal assistance, Oral care, Standing, Wash/dry face, Sitting Grooming Details (indicate cue type and reason): Min assist for standing balance. Pt with bil forearms resting on edge of sink throughout task. Upper Body Bathing:  Moderate assistance, Bed level Lower Body Bathing: Total assistance, Bed level Upper Body Dressing : Minimal assistance, Sitting Upper Body Dressing Details (indicate cue type and reason): to don/doff hospital gown sitting EOB Lower Body Dressing: Total assistance Lower Body Dressing Details (indicate cue type and reason): Total assist to readjust socks in sitting Toilet Transfer: Moderate assistance, +2 for physical assistance, Ambulation, BSC, RW Toilet Transfer Details (indicate cue type and reason): Simulated by sit to stand from EOB with functional mobility in room. Toileting- Clothing Manipulation and Hygiene: Total assistance, Bed level Functional mobility during ADLs: Minimal assistance, +2 for safety/equipment General ADL Comments: Pt very deconditioned and quick to fatigue. Pt with SOB after a few steps requiring multiple rest breaks with mobility in room. HR up to 146 with activity; returned to low 90s with seated rest break.   Mobility Overal bed mobility: Needs Assistance Bed Mobility: Rolling, Supine to Sit, Sit to Supine Rolling: Max assist Sidelying to sit: HOB elevated, Mod assist Supine to sit: Max assist, +2 for physical assistance Sit to supine: Max assist, +2 for physical assistance General bed mobility comments: With supine to sit, pt needed A for R LE and to get trunk upright.  With sit >supine, she needed A to get B LE up onto bed as well as trunk.  Then needed to get scooted to middle of bed.  Pt able to help pull self up in bed with rails with bed in Trendelenberg position.   Transfers Overall transfer level: Needs assistance Equipment used: Rolling walker (2 wheeled) Transfers: Sit to/from Stand Sit to Stand: Mod assist, Min assist, +2 physical assistance Stand pivot transfers: Mod assist, +2 safety/equipment General transfer comment: MOD A of 2 from bed and close to MIN A of 2 from recliner with increased time.   Ambulation / Gait /  Stairs / Psychologist, prison and probation services  Ambulation/Gait Ambulation/Gait assistance: Min assist, +2 safety/equipment Ambulation Distance (Feet): 6 Feet (plus 10') Assistive device: Rolling walker (2 wheeled) Gait Pattern/deviations: Decreased step length - right, Wide base of support, Antalgic, Trunk flexed General Gait Details: Pt ambulated 6' to sink did ADL with HR up to 170, however not a good EKG reading, and pt not showing signs of extreme fatigue or having any subjective reports of heart racing. Discovered one of her leads was off.  Lead placed back on and HR in low 100s. Then ambulated 10' back to bed with 3 standing rest breaks.  MAX HR was 147 with gait and pt was fatigued and wave form looked good. Gait velocity: decreased Gait velocity interpretation: Below normal speed for age/gender   Posture / Balance Dynamic Sitting Balance Sitting balance - Comments: Pt able to raise arms for UB dressing. Otherwise requires UE support Balance Overall balance assessment: Needs assistance Sitting-balance support: Feet supported, No upper extremity supported Sitting balance-Leahy Scale: Fair Sitting balance - Comments: Pt able to raise arms for UB dressing. Otherwise requires UE support Postural control: Posterior lean Standing balance support: Bilateral upper extremity supported, During functional activity Standing balance-Leahy Scale: Poor Standing balance comment: bil forearms leaning on sink for support in standing   Special needs/care consideration BiPAP/CPAP: No CPM: No Continuous Drip IV: No Dialysis: No         Life Vest: No Oxygen: No Special Bed: No Trach Size: No Wound Vac (area): No       Skin: Wounds packed wet to dry                              Location: Groin  Bowel mgmt: 02/21/16 Bladder mgmt: Foley Catheter  Diabetic mgmt: History of but current A1c was WNL per patient report     Previous Home Environment Living Arrangements: Spouse/significant other, Children, Other relatives (sister ) Available Help at  Discharge: Family, Available 24 hours/day Type of Home: House Home Layout: One level Home Access: Stairs to enter Entrance Stairs-Rails: Right Entrance Stairs-Number of Steps: 4-5 Bathroom Shower/Tub: Health visitor: Handicapped height Home Care Services: No  Discharge Living Setting Plans for Discharge Living Setting: Patient's home Type of Home at Discharge: House Discharge Home Layout: One level Discharge Home Access: Stairs to enter Entrance Stairs-Rails: Can reach both Entrance Stairs-Number of Steps: 4 steps Discharge Bathroom Shower/Tub: Walk-in shower Discharge Bathroom Toilet: Standard Discharge Bathroom Accessibility: No Does the patient have any problems obtaining your medications?: No  Social/Family/Support Systems Patient Roles: Spouse, Parent Contact Information: Spouse Arboriculturist  Anticipated Caregiver: Spouse, Sister, and daughter  Anticipated Caregiver's Contact Information: Eliezer Mccoy [phone] Ability/Limitations of Caregiver: Spouse works days, but patient's sister and daughter are intermittently available since they work nights  Caregiver Availability: 24/7 Discharge Plan Discussed with Primary Caregiver: No (discussed with patient as she is cognitively intact) Is Caregiver In Agreement with Plan?: No (patient is in agreement ) Does Caregiver/Family have Issues with Lodging/Transportation while Pt is in Rehab?: No  Goals/Additional Needs Patient/Family Goal for Rehab: PT/OT Supervision-Min assist  Expected length of stay: 14-17 days  Cultural Considerations: None Dietary Needs: Heart Healthy Diet  Equipment Needs: TBD Special Service Needs: Close monitoring and management of wounds  Additional Information: Reports being a diabetic in the past, but not now Pt/Family Agrees to Admission and willing to participate: Yes Program Orientation Provided & Reviewed with Pt/Caregiver Including Roles  &  Responsibilities:  Yes Additional Information Needs: None Information Needs to be Provided By: N/A  Decrease burden of Care through IP rehab admission: No  Possible need for SNF placement upon discharge: No  Patient Condition: This patient's medical and functional status has changed since the consult dated: 02/19/16 in which the Rehabilitation Physician determined and documented that the patient's condition is appropriate for intensive rehabilitative care in an inpatient rehabilitation facility. See "History of Present Illness" (above) for medical update. Functional changes are: Ambulation Min assist +2 6 feet and 10 feet with multiple rest breaks . Patient's medical and functional status update has been discussed with the Rehabilitation physician and patient remains appropriate for inpatient rehabilitation. Will admit to inpatient rehab today.   Preadmission Screen Completed By:  Fae Pippin, 02/22/2016 1:17 PM ______________________________________________________________________   Discussed status with Dr. Riley Kill on 02/22/16 at 1333 and received telephone approval for admission today.  Admission Coordinator:  Fae Pippin, time 1333/Date 02/22/16       Cosigned by: Ranelle Oyster, MD at 02/22/2016 2:01 PM  Revision History

## 2016-02-22 NOTE — Progress Notes (Addendum)
Inpatient Rehabilitation  I have insurance authorization and a bed to offer Ms. Belardo; however, I await acute MD medical clearance.  Plan to update team as I know. Please call with questions.   Addendum: I note discharge order as well as pended MD discharge.  Plan to admit patient to IP Rehab today.    Carmelia Roller., CCC/SLP Admission Coordinator  [facility]  Cell [phone]

## 2016-02-22 NOTE — Progress Notes (Signed)
[facility]     PROGRESS NOTE    Subjective/Complaints: Had a fair night. Room "hot". Pain under control.  ROS: Pt denies fever, rash/itching, headache, blurred or double vision, nausea, vomiting, abdominal pain, diarrhea, chest pain, shortness of breath, palpitations, dysuria, dizziness, neck pain, back pain, bleeding, anxiety, or depression   Objective: Vital Signs: Blood pressure (!) 129/55, pulse 83, temperature 98.6 F (37 C), temperature source Oral, resp. rate (!) 21, height 5\' 3"  (1.6 m), weight 135.6 kg (298 lb 15.1 oz), SpO2 97 %. No results found.  Recent Labs  02/21/16 0710 02/22/16 0635  WBC 11.6* 10.1  HGB 8.8* 8.5*  HCT 27.5* 25.9*  PLT 379 340    Recent Labs  02/20/16 2207  NA 139  K 3.8  CL 104  GLUCOSE 132*  BUN 6  CREATININE 0.69  CALCIUM 7.6*   CBG (last 3)  No results for input(s): GLUCAP in the last 72 hours.  Wt Readings from Last 3 Encounters:  02/22/16 135.6 kg (298 lb 15.1 oz)  02/22/16 135 kg (297 lb 9.6 oz)  10/08/15 122.5 kg (270 lb)    Physical Exam:  Constitutional: NAD Obese female HENT:  Head: Normocephalicand atraumatic.  Mouth/Throat: Oropharynx is clear and moist.  Eyes: pERRL.  Neck: supple, no jvd  Cardiovascular: RRR No murmurheard. Respiratory: CTA B. No wheezes  GI: Soft. Bowel sounds are normal. She exhibits no distension. There is no tenderness.  Musculoskeletal: Normal range of motion. She exhibits edema.  Neurological: She is alertand oriented to person, place, and time.  Skin: Skin is warmand dry.   Granulation and adipose tissue in open wounds---occasional small areas of fibronecrotic debris. Areas are tender with palpation/manipulation Psychiatric: normal affect.   Assessment/Plan: 1. Functional and mobility deficits  secondary to sepsis/abscessed wounds which require 3+ hours per day of interdisciplinary therapy in a comprehensive inpatient rehab  setting. Physiatrist is providing close team supervision and 24 hour management of active medical problems listed below. Physiatrist and rehab team continue to assess barriers to discharge/monitor patient progress toward functional and medical goals.  Function:  Bathing Bathing position      Bathing parts      Bathing assist        Upper Body Dressing/Undressing Upper body dressing                    Upper body assist        Lower Body Dressing/Undressing Lower body dressing                                  Lower body assist        Toileting Toileting          Toileting assist     Transfers Chair/bed transfer             Locomotion Ambulation           Wheelchair          Cognition Comprehension    Expression    Social Interaction    Problem Solving    Memory     Medical Problem List and Plan: 1. Functional and mobility deficitssecondary to inguinal/thigh abscess and sepsis with subsequent debility -begin CIR therapies 2. DVT Prophylaxis/Anticoagulation: Pharmaceutical: Lovenox 3. Pain Management: Premedicate with oxycodone prior to dressing changes  4. Mood: Motivated to put best effort and get home. LCSW to follow  for evaluation and support.  5. Neuropsych: This patient iscapable of making decisions on herown behalf. 6. Skin/Wound Care: Wet to dry dressing changes bid. Husband in process of learning wound care.--continue to educate other family members.   -for now, keep foley in to avoid contamination of wound and to help wound heal. Nursing reports needing to change dressing 4-5 times day before foley placed.   -some areas may need some localized debridement still 7. Fluids/Electrolytes/Nutrition: encourage PO  -I personally reviewed the patient's labs today.   -albumin still low  8. Necrotizing fasciitis with right groin abscess s/p I and D: To continue wet to dry dressing changed to right mons/thigh  wound. Daily showers recommended.   -IV Zosyn/Diflucan D # 18/21  9. Morbid obesity: Encourage appropriate diet and weight loss. Also discussed increasing activity level as patient sedentary. She reports that she has lost 50 lbs in the past 2 years and off BP/DM meds.    -consult dietician to review weight loss diet.  10. Reactive leucocytosis: 10.7, stable to decreased.  11. Stress induced hyperglycemia: Hgb A1c- 5.6 (10/2015). Added CM restrictions to diet.  12. Critical illness anemia: 11.5-->8.5---recheck Monday  -no obvious blood loss  13. 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   -helping sleep  LOS (Days) 0 A FACE TO FACE EVALUATION WAS PERFORMED  Hayk Divis T 02/22/2016 6:50 PM

## 2016-02-22 NOTE — PMR Pre-admission (Signed)
PMR Admission Coordinator Pre-Admission Assessment  Patient: Brandy Hardy is an 60 y.o., female MRN: [id number] DOB: 1955-08-04 Height: 5\' 3"  (160 cm) Weight: 135 kg (297 lb 9.6 oz)              Insurance Information HMO:     PPO:      PCP:      IPA:      80/20:      OTHER: X PRIMARY: [employer]      Policy#: [id number]      Subscriber: Self  CM Name: Dossie Der      Phone#: [phone]     Fax#: [phone] Pre-Cert#: [id number]      Employer: full time  Benefits:  Phone #: [phone]     Name: Mila Merry. Date: 04/08/15     Deduct: $500      Out of Pocket Max: $6,850      Life Max: none CIR: 80% covered after deductible       SNF: 80% covered after deductible Outpatient: PT/OT       Co-Pay: 20% after deductible, 90 combined visits a year  Home Health: PT/OT      Co-Pay: 20% after deductible DME: 80%      Co-Pay: 20% after deductible Providers: in-network   Medicaid Application Date:       Case Manager:  Disability Application Date:       Case Worker:   Emergency Contact Information Contact Information    Name Relation Home Work Mobile   Esh,Charles Spouse [phone]       Current Medical History  Patient Admitting Diagnosis: Debilitation   History of Present Illness: Brandy Hardy is a 60 year old female with history of recurrent endometrial cancer s/p hysterectomy and XRT, morbid obesity,  recent UTI who was admitted on 02/14/16 with weakness, poor po intake, abdominal pain and near syncope. She was noted to be dehydrated and hypotensive due to septic shock. She was treated with fluid bolus, IV antibiotics  and levophed. CT abdomen showed areas of subcutaneous cellulitis with early abscess formation and air with inflammatory changes tracing into bilateral upper thigh musculature.  She developed right groin drainage due to abscess. 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 Sheliah Hatch  on 11/11. She continued to have copious malodorous drainage from wound and was taken back to OR on 11/15 for repeat I and D and was found to have urinary retention during placement of foley catheter. Blood culture X 2 positive for beta lactamase positive Bacteriodes fragilis.  Wound culture with mixed flora. To continue bid dressing as will not be able to place VAC and continue IV antibiotics per CCS.  Therapy evaluations done showing patient limited by generalized weakness and difficulty mobilizing due to pain and body habitus.  CIR recommended for follow up therapy and patient admitted 02/22/16.       Past Medical History  Past Medical History:  Diagnosis Date  . Cervical adenocarcinoma (HCC)    stage II  . Endometrial cancer (HCC)    with recurrance at introitus   . History of prediabetes   . 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  Prior Rehab/Hospitalizations:  Has the patient had major surgery during 100 days prior to  admission? No  Current Medications   Current Facility-Administered Medications:  .  acetaminophen (TYLENOL) tablet 650 mg, 650 mg, Oral, Q6H PRN, Alyson Reedy, MD, 650 mg at 02/22/16 0808 .  atorvastatin (LIPITOR) tablet 40 mg, 40 mg, Oral, Daily, Claud Kelp, MD, 40 mg at 02/22/16 0808 .  6 CHG cloth bath night before surgery, , , Once **AND** 6 CHG cloth bath AM of surgery, , , Once **AND** Chlorhexidine Gluconate Cloth 2 % PADS 6 each, 6 each, Topical, Once **AND** [COMPLETED] Chlorhexidine Gluconate Cloth 2 % PADS 6 each, 6 each, Topical, Once, Claud Kelp, MD, 6 each at 02/20/16 0751 .  feeding supplement (ENSURE ENLIVE) (ENSURE ENLIVE) liquid 237 mL, 237 mL, Oral, TID WC, Richarda Overlie, MD, 237 mL at 02/19/16 1700 .  fluconazole (DIFLUCAN) tablet 200 mg, 200 mg, Oral, Daily, Jenita Seashore, RPH, 200 mg at 02/22/16 0807 .   heparin injection 5,000 Units, 5,000 Units, Subcutaneous, Q8H, Duayne Cal, NP, 5,000 Units at 02/22/16 [phone] .  methocarbamol (ROBAXIN) tablet 500 mg, 500 mg, Oral, Q8H PRN, De Blanch Kinsinger, MD .  morphine 2 MG/ML injection 2-4 mg, 2-4 mg, Intravenous, Q2H PRN, Rodman Pickle, MD, 2 mg at 02/22/16 0807 .  multivitamin with minerals tablet 1 tablet, 1 tablet, Oral, Daily, Richarda Overlie, MD, 1 tablet at 02/22/16 0808 .  ondansetron (ZOFRAN-ODT) disintegrating tablet 4 mg, 4 mg, Oral, Q6H PRN **OR** ondansetron (ZOFRAN) injection 4 mg, 4 mg, Intravenous, Q6H PRN, Claud Kelp, MD .  oxyCODONE (Oxy IR/ROXICODONE) immediate release tablet 5 mg, 5 mg, Oral, Q6H PRN, Rodman Pickle, MD, 5 mg at 02/19/16 0543 .  pantoprazole (PROTONIX) EC tablet 40 mg, 40 mg, Oral, Daily, Duayne Cal, NP, 40 mg at 02/22/16 0807 .  piperacillin-tazobactam (ZOSYN) IVPB 3.375 g, 3.375 g, Intravenous, Q8H, Stevphen Rochester, RPH, 3.375 g at 02/22/16 0629 .  senna (SENOKOT) tablet 8.6 mg, 1 tablet, Oral, BID, Claud Kelp, MD, 8.6 mg at 02/22/16 0808 .  sodium chloride flush (NS) 0.9 % injection 10-40 mL, 10-40 mL, Intracatheter, PRN, Lonia Blood, MD .  sodium chloride flush (NS) 0.9 % injection 10-40 mL, 10-40 mL, Intracatheter, PRN, Elease Etienne, MD  Patients Current Diet: Diet Heart Room service appropriate? 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  She would drive and go out on the weekends to run errands.     Home Assistive Devices / Equipment Home Assistive Devices/Equipment: Dan Humphreys (specify type) Home Equipment:  Walker - 2 wheels, Grab bars - tub/shower  Prior Device Use: Indicate devices/aids used by the patient prior to current illness, exacerbation or injury? Used a walker just prior to admission due to pain   Prior Functional Level Prior Function Level of Independence: Independent with assistive device(s) Comments: Using RW for ambulation in the last few days PTA. Cooks, cleans.   Self Care: Did the patient need help bathing, dressing, using the toilet or eating?  Independent  Indoor Mobility: Did the patient need assistance with walking from room to room (with or without device)? Independent  Stairs: Did the patient need assistance with internal or external stairs (with or without  device)? Independent  Functional Cognition: Did the patient need help planning regular tasks such as shopping or remembering to take medications? Independent  Current Functional Level Cognition  Overall Cognitive Status: Within Functional Limits for tasks assessed Orientation Level: Oriented X4    Extremity Assessment (includes Sensation/Coordination)  Upper Extremity Assessment: Generalized weakness  Lower Extremity Assessment: Defer to PT evaluation    ADLs  Overall ADL's : Needs assistance/impaired Eating/Feeding: Independent Grooming: Minimal assistance, Oral care, Standing, Wash/dry face, Sitting Grooming Details (indicate cue type and reason): Min assist for standing balance. Pt with bil forearms resting on edge of sink throughout task. Upper Body Bathing: Moderate assistance, Bed level Lower Body Bathing: Total assistance, Bed level Upper Body Dressing : Minimal assistance, Sitting Upper Body Dressing Details (indicate cue type and reason): to don/doff hospital gown sitting EOB Lower Body Dressing: Total assistance Lower Body Dressing Details (indicate cue type and reason): Total assist to readjust socks in sitting Toilet Transfer: Moderate assistance, +2 for physical assistance, Ambulation, BSC,  RW Toilet Transfer Details (indicate cue type and reason): Simulated by sit to stand from EOB with functional mobility in room. Toileting- Clothing Manipulation and Hygiene: Total assistance, Bed level Functional mobility during ADLs: Minimal assistance, +2 for safety/equipment General ADL Comments: Pt very deconditioned and quick to fatigue. Pt with SOB after a few steps requiring multiple rest breaks with mobility in room. HR up to 146 with activity; returned to low 90s with seated rest break.    Mobility  Overal bed mobility: Needs Assistance Bed Mobility: Rolling, Supine to Sit, Sit to Supine Rolling: Max assist Sidelying to sit: HOB elevated, Mod assist Supine to sit: Max assist, +2 for physical assistance Sit to supine: Max assist, +2 for physical assistance General bed mobility comments: With supine to sit, pt needed A for R LE and to get trunk upright.  With sit >supine, she needed A to get B LE up onto bed as well as trunk.  Then needed to get scooted to middle of bed.  Pt able to help pull self up in bed with rails with bed in Trendelenberg position.    Transfers  Overall transfer level: Needs assistance Equipment used: Rolling walker (2 wheeled) Transfers: Sit to/from Stand Sit to Stand: Mod assist, Min assist, +2 physical assistance Stand pivot transfers: Mod assist, +2 safety/equipment General transfer comment: MOD A of 2 from bed and close to MIN A of 2 from recliner with increased time.    Ambulation / Gait / Stairs / Wheelchair Mobility  Ambulation/Gait Ambulation/Gait assistance: Min assist, +2 safety/equipment Ambulation Distance (Feet): 6 Feet (plus 10') Assistive device: Rolling walker (2 wheeled) Gait Pattern/deviations: Decreased step length - right, Wide base of support, Antalgic, Trunk flexed General Gait Details: Pt ambulated 6' to sink did ADL with HR up to 170, however not a good EKG reading, and pt not showing signs of extreme fatigue or having any subjective  reports of heart racing. Discovered one of her leads was off.  Lead placed back on and HR in low 100s. Then ambulated 10' back to bed with 3 standing rest breaks.  MAX HR was 147 with gait and pt was fatigued and wave form looked good. Gait velocity: decreased Gait velocity interpretation: Below normal speed for age/gender    Posture / Balance Dynamic Sitting Balance Sitting balance - Comments: Pt able to raise arms for UB dressing. Otherwise requires UE support Balance Overall balance assessment: Needs assistance Sitting-balance support: Feet supported, No upper extremity supported Sitting  balance-Leahy Scale: Fair Sitting balance - Comments: Pt able to raise arms for UB dressing. Otherwise requires UE support Postural control: Posterior lean Standing balance support: Bilateral upper extremity supported, During functional activity Standing balance-Leahy Scale: Poor Standing balance comment: bil forearms leaning on sink for support in standing    Special needs/care consideration BiPAP/CPAP: No CPM: No Continuous Drip IV: No Dialysis: No         Life Vest: No Oxygen: No Special Bed: No Trach Size: No Wound Vac (area): No       Skin: Wounds packed wet to dry                              Location: Groin  Bowel mgmt: 02/21/16 Bladder mgmt: Foley Catheter  Diabetic mgmt: History of but current A1c was WNL per patient report      Previous Home Environment Living Arrangements: Spouse/significant other, Children, Other relatives (sister ) Available Help at Discharge: Family, Available 24 hours/day Type of Home: House Home Layout: One level Home Access: Stairs to enter Entrance Stairs-Rails: Right Entrance Stairs-Number of Steps: 4-5 Bathroom Shower/Tub: Health visitor: Handicapped height Home Care Services: No  Discharge Living Setting Plans for Discharge Living Setting: Patient's home Type of Home at Discharge: House Discharge Home Layout: One level Discharge  Home Access: Stairs to enter Entrance Stairs-Rails: Can reach both Entrance Stairs-Number of Steps: 4 steps Discharge Bathroom Shower/Tub: Walk-in shower Discharge Bathroom Toilet: Standard Discharge Bathroom Accessibility: No Does the patient have any problems obtaining your medications?: No  Social/Family/Support Systems Patient Roles: Spouse, Parent Contact Information: Spouse Arboriculturist  Anticipated Caregiver: Spouse, Sister, and daughter  Anticipated Caregiver's Contact Information: Eliezer Mccoy [phone] Ability/Limitations of Caregiver: Spouse works days, but patient's sister and daughter are intermittently available since they work nights  Caregiver Availability: 24/7 Discharge Plan Discussed with Primary Caregiver: No (discussed with patient as she is cognitively intact) Is Caregiver In Agreement with Plan?: No (patient is in agreement ) Does Caregiver/Family have Issues with Lodging/Transportation while Pt is in Rehab?: No  Goals/Additional Needs Patient/Family Goal for Rehab: PT/OT Supervision-Min assist  Expected length of stay: 14-17 days  Cultural Considerations: None Dietary Needs: Heart Healthy Diet  Equipment Needs: TBD Special Service Needs: Close monitoring and management of wounds  Additional Information: Reports being a diabetic in the past, but not now Pt/Family Agrees to Admission and willing to participate: Yes Program Orientation Provided & Reviewed with Pt/Caregiver Including Roles  & Responsibilities: Yes Additional Information Needs: None Information Needs to be Provided By: N/A  Decrease burden of Care through IP rehab admission: No  Possible need for SNF placement upon discharge: No  Patient Condition: This patient's medical and functional status has changed since the consult dated: 02/19/16 in which the Rehabilitation Physician determined and documented that the patient's condition is appropriate for intensive rehabilitative care in an  inpatient rehabilitation facility. See "History of Present Illness" (above) for medical update. Functional changes are: Ambulation Min assist +2 6 feet and 10 feet with multiple rest breaks . Patient's medical and functional status update has been discussed with the Rehabilitation physician and patient remains appropriate for inpatient rehabilitation. Will admit to inpatient rehab today.   Preadmission Screen Completed By:  Fae Pippin, 02/22/2016 1:17 PM ______________________________________________________________________   Discussed status with Dr. Riley Kill on 02/22/16 at 1333 and received telephone approval for admission today.  Admission Coordinator:  Fae Pippin, time 1333/Date 02/22/16

## 2016-02-23 ENCOUNTER — Inpatient Hospital Stay (HOSPITAL_COMMUNITY): Payer: Managed Care, Other (non HMO) | Admitting: Physical Therapy

## 2016-02-23 ENCOUNTER — Inpatient Hospital Stay (HOSPITAL_COMMUNITY): Payer: Managed Care, Other (non HMO) | Admitting: Occupational Therapy

## 2016-02-23 LAB — COMPREHENSIVE METABOLIC PANEL
ALK PHOS: 76 U/L (ref 38–126)
ALT: 24 U/L (ref 14–54)
AST: 26 U/L (ref 15–41)
Albumin: 1.5 g/dL — ABNORMAL LOW (ref 3.5–5.0)
Anion gap: 9 (ref 5–15)
CHLORIDE: 100 mmol/L — AB (ref 101–111)
CO2: 28 mmol/L (ref 22–32)
CREATININE: 0.72 mg/dL (ref 0.44–1.00)
Calcium: 8.2 mg/dL — ABNORMAL LOW (ref 8.9–10.3)
GFR calc Af Amer: >60 mL/min (ref >60)
GFR calc non Af Amer: >60 mL/min (ref >60)
GLUCOSE: 98 mg/dL (ref 65–99)
Potassium: 3.7 mmol/L (ref 3.5–5.1)
SODIUM: 137 mmol/L (ref 135–145)
Total Bilirubin: 0.4 mg/dL (ref 0.3–1.2)
Total Protein: 4.7 g/dL — ABNORMAL LOW (ref 6.5–8.1)

## 2016-02-23 LAB — CBC WITH DIFFERENTIAL/PLATELET
BASOS ABS: 0 10*3/uL (ref 0.0–0.1)
Basophils Relative: 0 %
EOS ABS: 0.1 10*3/uL (ref 0.0–0.7)
EOS PCT: 1 %
HCT: 26.6 % — ABNORMAL LOW (ref 36.0–46.0)
HEMOGLOBIN: 8.5 g/dL — AB (ref 12.0–15.0)
LYMPHS PCT: 10 %
Lymphs Abs: 1.1 10*3/uL (ref 0.7–4.0)
MCH: 30.4 pg (ref 26.0–34.0)
MCHC: 32 g/dL (ref 30.0–36.0)
MCV: 95 fL (ref 78.0–100.0)
Monocytes Absolute: 0.5 10*3/uL (ref 0.1–1.0)
Monocytes Relative: 5 %
NEUTROS PCT: 84 %
Neutro Abs: 9 10*3/uL — ABNORMAL HIGH (ref 1.7–7.7)
PLATELETS: 378 10*3/uL (ref 150–400)
RBC: 2.8 MIL/uL — AB (ref 3.87–5.11)
RDW: 16 % — ABNORMAL HIGH (ref 11.5–15.5)
WBC: 10.7 10*3/uL — AB (ref 4.0–10.5)

## 2016-02-23 LAB — HIV ANTIBODY (ROUTINE TESTING W REFLEX): HIV Screen 4th Generation wRfx: NONREACTIVE

## 2016-02-23 LAB — HEPATITIS C ANTIBODY: HCV Ab: <0.1 s/co ratio (ref 0.0–0.9)

## 2016-02-23 NOTE — Evaluation (Signed)
Physical Therapy Assessment and Plan  Patient Details  Name: Brandy Hardy MRN: [id number] Date of Birth: 1955/06/26  PT Diagnosis: Abnormal posture, Difficulty walking, Muscle weakness and Pain in right groin Rehab Potential: Good ELOS: 2-3 weeks   Today's Date: 02/23/2016 PT Individual Time: 1300-1400 PT Individual Time Calculation (min): 60 min     Problem List: Patient Active Problem List   Diagnosis Date Noted  . Debility 02/22/2016  . Abscess of right groin 02/20/2016  . Fever   . History of cervical cancer   . Acute blood loss anemia   . Anemia of chronic disease   . Tachypnea   . Lymphocytosis   . Abscess of groin, right   . Super obese (HCC)   . Sepsis (HCC)   . Septic shock (HCC) 02/14/2016  . UTI (urinary tract infection)   . Obesity, morbid (HCC) 02/21/2013  . Essential hypertension 11/23/2012  . Type II or unspecified type diabetes mellitus without mention of complication, not stated as uncontrolled 11/23/2012  . Other and unspecified hyperlipidemia 11/23/2012    Past Medical History:  Past Medical History:  Diagnosis Date  . Cervical adenocarcinoma (HCC)    stage II  . Endometrial cancer (HCC)    with recurrance at introitus   . History of prediabetes   . 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 ABDOMINAL HYSTERECTOMY    . I&D EXTREMITY Right 02/20/2016   Procedure: IRRIGATION AND DEBRIDEMENT EXTREMITY RIGHT UPPER THIGH;  Surgeon: Claud Kelp, MD;  Location: [facility] OR;  Service: General;  Laterality: Right;  . IRRIGATION AND DEBRIDEMENT ABSCESS Right 02/16/2016   Procedure: IRRIGATION AND DEBRIDEMENT RIGHT GROIN ABSCESS, FULL THICKNESS DEBRIDMENT RIGHT GROIN INCLUDING > 40CM SQUARE.;  Surgeon: De Blanch Kinsinger, MD;  Location: [facility] OR;  Service: General;  Laterality: Right;  . IRRIGATION AND DEBRIDEMENT ABSCESS Right 02/20/2016   Procedure: IRRIGATION  AND DEBRIDEMENT ABSCESS;  Surgeon: Claud Kelp, MD;  Location: [facility] OR;  Service: General;  Laterality: Right;  Perii-Vaginal Abscess    Assessment & Plan Clinical Impression: Brandy Hardy is a 60 year old female with history of recurrent endometrial cancer s/p hysterectomy and XRT, morbid obesity, recent UTI who was admitted on 02/14/16 with weakness, poor po intake, abdominal pain and near syncope. She was noted to be dehydrated and hypotensive due to septic shock. She was treated with fluid bolus, IV antibiotics and levophed. CT abdomen showed areas of subcutaneous cellulitis with early abscess formation and air with inflammatory changes tracing into bilateral upper thigh musculature. She developed right groin drainage due to abscess. 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 Sheliah Hatch on 11/11. She continued to have copious malodorous drainage from wound and was taken back to OR on 11/15 for repeat I and D and was found to have urinary retention during placement of foley catheter.   Blood culture X 2 positive for beta lactamase positive Bacteriodes fragilis. Wound culture with mixed flora. To continue bid dressing as will not be able to place VAC and continue IV antibiotics per [facility]. Dr. Drue Second recommends weeks of IV antibiotic regiment with 11/12 as day one--antibiotic narrowed to Zosyn and Flucanozole. Therapy evaluations done showing patient limited by generalized weakness and difficulty mobilizing due to pain and body habitus. Patient transferred to CIR on 02/22/2016.  Patient currently requires total with mobility secondary to muscle weakness, decreased cardiorespiratoy endurance and decreased sitting balance, decreased standing balance, decreased postural control and decreased balance strategies.  Prior to hospitalization, patient was independent  with mobility and lived with Spouse, Family,  Daughter (sister) in a House home.  Home access is 4 stairs to porch, 2 steps to get into houseStairs to enter.  Patient will benefit from skilled PT intervention to maximize safe functional mobility, minimize fall risk and decrease caregiver burden for planned discharge home with 24 hour supervision.  Anticipate patient will benefit from follow up HH at discharge.  PT - End of Session Activity Tolerance: Decreased this session;Tolerates < 10 min activity, no significant change in vital signs Endurance Deficit: Yes Endurance Deficit Description: patient fatigued after sitting EOB and transferring to recliner PT Assessment Rehab Potential (ACUTE/IP ONLY): Good Barriers to Discharge: Inaccessible home environment PT Patient demonstrates impairments in the following area(s): Balance;Edema;Endurance;Motor;Nutrition;Pain;Safety;Skin Integrity PT Transfers Functional Problem(s): Bed Mobility;Bed to Chair;Car;Furniture PT Locomotion Functional Problem(s): Ambulation;Wheelchair Mobility;Stairs PT Plan PT Intensity: Minimum of 1-2 x/day ,45 to 90 minutes PT Frequency: 5 out of 7 days PT Duration Estimated Length of Stay: 2-3 weeks PT Treatment/Interventions: Ambulation/gait training;Balance/vestibular training;Community reintegration;Discharge planning;Disease management/prevention;DME/adaptive equipment instruction;Functional mobility training;Neuromuscular re-education;Pain management;Patient/family education;Psychosocial support;Skin care/wound management;Stair training;Therapeutic Activities;Therapeutic Exercise;UE/LE Coordination activities;UE/LE Strength taining/ROM;Wheelchair propulsion/positioning PT Transfers Anticipated Outcome(s): supervision PT Locomotion Anticipated Outcome(s): supervision household PT Recommendation Follow Up Recommendations: Home health PT;24 hour supervision/assistance Patient destination: Home Equipment Recommended: To be determined  Skilled Therapeutic  Intervention Skilled therapeutic intervention initiated after completion of evaluation. Discussed with patient falls risk, safety within room, focus of therapy during stay, possible length of stay, goals, and follow-up therapy. Patient on bedpan upon arrival, performed rolling using rails with mod A for total A hygiene. Patient required increased time and effort and max A overall to sit EOB with use of rail. Patient sat EOB x 15 min with supervision and B feet and LUE supported until LUE fatigued. Attempted bariatric Stedy but not appropriate at this time due to body habitus. Patient performed sit <> stand from EOB using bariatric RW with +2A and ambulated approx 5 ft to recliner using RW with steadying assist and +2 for stand > sit and scooting back in seat. Patient ate lunch meal seated in recliner with BLE elevated while therapist retrieved 24 x 18 wheelchair and elevating leg rests for next therapy session. Patient left sitting in recliner with BLE elevated and ll needs in reach.   PT Evaluation Precautions/Restrictions Precautions Precautions: Fall Precaution Comments: R groin abscess, port Restrictions Weight Bearing Restrictions: No General Chart Reviewed: Yes Family/Caregiver Present: No  Vital Signs HR 92, Sp02 97% on ra after transfer Pain Pain Assessment Pain Assessment: 0-10 Pain Score: 5  Pain Type: Acute pain Pain Location: Groin Pain Orientation: Right Pain Descriptors / Indicators: Tender Pain Onset: On-going Pain Intervention(s): Repositioned Home Living/Prior Functioning Home Living Available Help at Discharge: Family;Available 24 hours/day (spouse on FMLA) Type of Home: House Home Access: Stairs to enter [employer] of Steps: 4 stairs to porch, 2 steps to get into house Entrance Stairs-Rails: Right;Left (for first 4 stairs) Home Layout: One level Bathroom Shower/Tub: Health visitor: Standard Bathroom Accessibility: No  Lives With:  Spouse;Family;Daughter (sister) Prior Function Level of Independence: Independent with basic ADLs;Independent with transfers;Independent with homemaking with ambulation;Independent with gait  Able to Take Stairs?: Yes Driving: Yes Vocation: Full time employment Vocation Requirements: travel agent, work from home Leisure: Hobbies-yes (  Comment) Comments: house chores, shopping, church, cooking Vision/Perception  Perception Comments: WFL  Cognition Overall Cognitive Status: Within Functional Limits for tasks assessed Arousal/Alertness: Awake/alert Attention: Focused Focused Attention: Appears intact Memory: Appears intact Awareness: Appears intact Problem Solving: Appears intact Safety/Judgment: Appears intact Sensation Sensation Light Touch: Appears Intact Stereognosis: Not tested Hot/Cold: Appears Intact Proprioception: Appears Intact Coordination Gross Motor Movements are Fluid and Coordinated: No Fine Motor Movements are Fluid and Coordinated: Yes Coordination and Movement Description: weakness, limited by body habitus Finger Nose Finger Test: Intact Motor  Motor Motor: Within Functional Limits Motor - Skilled Clinical Observations: significant trunk weakness when sitting at EOB   Mobility Bed Mobility Bed Mobility: Rolling Right;Rolling Left Rolling Right: 3: Mod assist Rolling Right Details: Verbal cues for precautions/safety;Verbal cues for technique;Verbal cues for sequencing Rolling Left: 3: Mod assist Transfers Transfers: Yes Sit to Stand: With upper extremity assist;1: +2 Total assist;From bed Stand to Sit: To chair/3-in-1;1: +2 Total assist;With upper extremity assist Locomotion  Ambulation Ambulation: Yes Ambulation/Gait Assistance: 4: Min assist Ambulation Distance (Feet): 5 Feet Assistive device: Rolling walker Gait Gait: Yes Gait Pattern: Impaired Gait Pattern: Decreased stride length;Decreased dorsiflexion - right;Decreased weight shift to  right;Decreased hip/knee flexion - right;Wide base of support;Trunk flexed Gait velocity: decreased Stairs / Additional Locomotion Stairs: No Wheelchair Mobility Wheelchair Mobility: No  Trunk/Postural Assessment  Cervical Assessment Cervical Assessment: Within Functional Limits Thoracic Assessment Thoracic Assessment: Exceptions to [facility] (flexed) Lumbar Assessment Lumbar Assessment: Exceptions to [facility] (posterior pelvic tilt) Postural Control Postural Control: Deficits on evaluation Protective Responses: insufficient due to generalized weakness and debility  Balance Balance Balance Assessed: Yes Static Sitting Balance Static Sitting - Balance Support: Left upper extremity supported;Feet supported Static Sitting - Level of Assistance: 5: Stand by assistance Static Sitting - Comment/# of Minutes: 15 min Extremity Assessment  RUE Assessment RUE Assessment: Within Functional Limits (4+/5) LUE Assessment LUE Assessment: Within Functional Limits (4/5 MMT) RLE Assessment RLE Assessment: Exceptions to [facility] RLE Strength RLE Overall Strength: Deficits RLE Overall Strength Comments: unable to formally assess, decreased AROM due to pain/weakness LLE Assessment LLE Assessment: Exceptions to [facility] LLE Strength LLE Overall Strength: Deficits LLE Overall Strength Comments: unable to formally test, generalized weakness  See Function Navigator for Current Functional Status.   Refer to Care Plan for Long Term Goals  Recommendations for other services: None  Discharge Criteria: Patient will be discharged from PT if patient refuses treatment 3 consecutive times without medical reason, if treatment goals not met, if there is a change in medical status, if patient makes no progress towards goals or if patient is discharged from hospital.  The above assessment, treatment plan, treatment alternatives and goals were discussed and mutually agreed upon: by patient  Kerney Elbe 02/23/2016, 2:25  PM

## 2016-02-23 NOTE — Progress Notes (Signed)
Initial Nutrition Assessment  DOCUMENTATION CODES:   Morbid obesity  INTERVENTION:   Reviewed healthy diet for weight loss.  Ensure Enlive po BID, each supplement provides 350 kcal and 20 grams of protein  NUTRITION DIAGNOSIS:   Inadequate oral intake related to  (food options and varied appetite) as evidenced by meal completion < 50%.  GOAL:   Patient will meet greater than or equal to 90% of their needs  MONITOR:   PO intake, Supplement acceptance, I & O's, Weight trends, Skin  REASON FOR ASSESSMENT:   Consult Diet education  ASSESSMENT:    60 y.o. female who presented to ED for weakness and s/p fall without LOC or head trauma. In the ED, patient was found to be febrile, tachycardic, and hypotensive. Found to have right groin necrotizing fasciitis, right thigh abscess. S/P I&D on 11/11.   Meal Completion: 25-50% Per pt and husband appetite varies and does not always like the food offered. Husband goes to cafeteria to get food for pt. Reviewed healthy food options on the menu as well as in the cafeteria.  Pt has been eating less than 50% of her meals will continue ensure.   Medications reviewed and include: MVI, senokot Labs reviewed Nutrition-Focused physical exam completed. Findings are no fat depletion, no muscle depletion, and mild edema.       Diet Order:  Diet heart healthy/carb modified Room service appropriate? Yes; Fluid consistency: Thin  Skin:  Wound (see comment) (incision/wound to right thigh and groin)  Last BM:  11/17  Height:   Ht Readings from Last 1 Encounters:  02/22/16 5\' 3"  (1.6 m)    Weight:   Wt Readings from Last 1 Encounters:  02/22/16 298 lb 15.1 oz (135.6 kg)    Ideal Body Weight:  52.3 kg  BMI:  Body mass index is 52.96 kg/m.  Estimated Nutritional Needs:   Kcal:  2000-2200  Protein:  115-135 grams  Fluid:  > 2 L/day  EDUCATION NEEDS:   Education needs addressed  Maylon Peppers RD, [place], [place]  Pager [phone] After Hours Pager

## 2016-02-23 NOTE — Evaluation (Addendum)
Occupational Therapy Assessment and Plan  Patient Details  Name: Brandy Hardy MRN: [id number] Date of Birth: 1955/10/22  OT Diagnosis: abnormal posture and muscle weakness (generalized) Rehab Potential: Rehab Potential (ACUTE ONLY): Excellent ELOS: 23-25 days   Today's Date: 02/23/2016 OT Individual Time: 8295-6213 and 1433-1530 OT Individual Time Calculation (min): 78 min and 57 minutes     Problem List: Patient Active Problem List   Diagnosis Date Noted  . Debility 02/22/2016  . Abscess of right groin 02/20/2016  . Fever   . History of cervical cancer   . Acute blood loss anemia   . Anemia of chronic disease   . Tachypnea   . Lymphocytosis   . Abscess of groin, right   . Super obese (HCC)   . Sepsis (HCC)   . Septic shock (HCC) 02/14/2016  . UTI (urinary tract infection)   . Obesity, morbid (HCC) 02/21/2013  . Essential hypertension 11/23/2012  . Type II or unspecified type diabetes mellitus without mention of complication, not stated as uncontrolled 11/23/2012  . Other and unspecified hyperlipidemia 11/23/2012    Past Medical History:  Past Medical History:  Diagnosis Date  . Cervical adenocarcinoma (HCC)    stage II  . Endometrial cancer (HCC)    with recurrance at introitus   . History of prediabetes   . 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 ABDOMINAL HYSTERECTOMY    . I&D EXTREMITY Right 02/20/2016   Procedure: IRRIGATION AND DEBRIDEMENT EXTREMITY RIGHT UPPER THIGH;  Surgeon: Claud Kelp, MD;  Location: [facility] OR;  Service: General;  Laterality: Right;  . IRRIGATION AND DEBRIDEMENT ABSCESS Right 02/16/2016   Procedure: IRRIGATION AND DEBRIDEMENT RIGHT GROIN ABSCESS, FULL THICKNESS DEBRIDMENT RIGHT GROIN INCLUDING > 40CM SQUARE.;  Surgeon: De Blanch Kinsinger, MD;  Location: [facility] OR;  Service: General;  Laterality: Right;  . IRRIGATION AND DEBRIDEMENT ABSCESS  Right 02/20/2016   Procedure: IRRIGATION AND DEBRIDEMENT ABSCESS;  Surgeon: Claud Kelp, MD;  Location: [facility] OR;  Service: General;  Laterality: Right;  Perii-Vaginal Abscess    Assessment & Plan Clinical Impression:  Brandy Hardy is a 60 year old female with history of recurrent endometrial cancer s/p hysterectomy and XRT, morbid obesity,  recent UTI who was admitted on 02/14/16 with weakness, poor po intake, abdominal pain and near syncope. She was noted to be dehydrated and hypotensive due to septic shock. She was treated with fluid bolus, IV antibiotics  and levophed. CT abdomen showed areas of subcutaneous cellulitis with early abscess formation and air with inflammatory changes tracing into bilateral upper thigh musculature.  She developed right groin drainage due to abscess. 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 Sheliah Hatch on 11/11. She continued to have copious malodorous drainage from wound and was taken back to OR on 11/15 for repeat I and D and was found to have urinary retention during placement of foley catheter.   Blood culture X 2 positive for beta lactamase positive Bacteriodes fragilis.  Wound culture with mixed flora. To continue bid dressing as will not be able to place VAC and continue IV antibiotics per [facility].  Dr. Drue Second recommends weeks of IV antibiotic regiment with 11/12 as day one--antibiotic narrowed to Zosyn and Flucanozole.  Therapy evaluations done showing patient limited by generalized weakness and difficulty mobilizing due  to pain and body habitus.  CIR recommended for follow up therapy.   Patient currently requires max with basic self-care skills secondary to muscle weakness, decreased cardiorespiratoy endurance and decreased sitting balance, decreased postural control and decreased balance strategies.  Prior to hospitalization, patient could complete BADLs with modified  independent .  Patient will benefit from skilled intervention to increase independence with basic self-care skills prior to discharge home with care partner.  Anticipate patient will require intermittent supervision and follow up home health.  OT - End of Session Endurance Deficit: Yes OT Assessment Rehab Potential (ACUTE ONLY): Excellent Barriers to Discharge:  (N/A) OT Patient demonstrates impairments in the following area(s): Balance;Safety;Skin Integrity;Edema;Endurance;Motor OT Basic ADL's Functional Problem(s): Grooming;Bathing;Dressing;Toileting OT Advanced ADL's Functional Problem(s): Simple Meal Preparation;Laundry OT Transfers Functional Problem(s): Toilet;Tub/Shower OT Plan OT Intensity: Minimum of 1-2 x/day, 45 to 90 minutes OT Frequency: 5 out of 7 days OT Duration/Estimated Length of Stay: 23-25 days OT Treatment/Interventions: Discharge planning;Self Care/advanced ADL retraining;Therapeutic Activities;Functional mobility training;Patient/family education;Therapeutic Exercise;Psychosocial support;DME/adaptive equipment instruction;UE/LE Strength taining/ROM OT Self Feeding Anticipated Outcome(s): N/A OT Basic Self-Care Anticipated Outcome(s): Supervision-Min A OT Toileting Anticipated Outcome(s): Min A OT Bathroom Transfers Anticipated Outcome(s): Supervision OT Recommendation Patient destination: Home Follow Up Recommendations: Home health OT Equipment Recommended: To be determined   Skilled Therapeutic Intervention Skilled OT session completed with focus on initial evaluation, education on OT role/POC and CIR. Pt was lying in bed with husband present at time of arrival. 02 sats/HR assessed at rest: 79BPM and 94% 02 on room air. Mod A supine<sit for UB bathing. Pt required Min A for maintaining upright posture while engaged in physical activity due to trunk weakness. Vitals stable and frequently assessed. LB self care completed with pt instructed on rolling bedlevel. Pt  required overall Mod-Max A, attempted sit<stand with 2 helpers and Mod A with pt able to achieve partial stand before fatiguing and sitting down. Pt was returned to bed and repositioned for comfort, left with all needs within reach at end of session. No c/o pain.   2nd Session 1:1 Tx (57 minutes) Pt was seated in recliner with husband present at time of arrival. Pt was agreeable to problem solve toilet transfers with nursing staff. New bariatric BSC was retrieved due to broken one present in bathroom. Pt completed multiple stand pivot toilet transfers with 2 helpers and RW to new BSC. Height adjusted per pts comfort with use of footstool to support LEs. Pillow used for back. Pt attempted to void during session but was unsuccessful. Pt then ambulated back to bed in manner as written above. Laundry folding task completed at EOB for trunk strengthening and activity tolerance. Min A for sitting balance. At end of session pt was returned to bed and left with all needs within reach. HR/02 sats stable throughout session; no c/o pain.   OT Evaluation Precautions/Restrictions  Precautions Precautions: Fall;Other (comment) (right groin abscess ) Precaution Comments:  (monitor HR) Restrictions Weight Bearing Restrictions: No General Chart Reviewed: Yes Family/Caregiver Present: Yes Home Living/Prior Functioning Home Living Family/patient expects to be discharged to:: Private residence Living Arrangements: Spouse/significant other, Children, Other relatives Available Help at Discharge: Family, Available 24 hours/day Type of Home: House Home Access: Stairs to enter [employer] of Steps: 4-5 Entrance Stairs-Rails: Right Home Layout: One level Bathroom Shower/Tub: Walk-in shower (with built in seat, hand held shower, grab bars) Bathroom Toilet: Handicapped height Bathroom Accessibility: Yes  Lives With: Spouse, Family IADL History Homemaking Responsibilities: Yes Meal Prep Responsibility:  Secondary  Laundry Responsibility: Secondary Cleaning Responsibility: Secondary Shopping Responsibility: Secondary Homemaking Comments: Pt enjoyed cooking and cleaning with her family able to share responsibilities  Current License: Yes Mode of Transportation: Car Occupation: Full time employment 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, working from home  Leisure and Hobbies: Spending time with family, running errands on weekends Prior Function Level of Independence: Independent with basic ADLs, Independent with homemaking with ambulation  Able to Take Stairs?: Yes Driving: Yes Vocation: Full time employment ADL ADL ADL Comments: Please see functional navigator for ADL status Vision/Perception  Vision- History Baseline Vision/History: No visual deficits Patient Visual Report: No change from baseline Vision- Assessment Vision Assessment?: No apparent visual deficits Perception Comments: WFL  Cognition Overall Cognitive Status: Within Functional Limits for tasks assessed Arousal/Alertness: Awake/alert Orientation Level: Person;Place;Situation Person: Oriented Place: Oriented Situation: Oriented Year: 2017 Month: November Day of Week: Correct Memory: Appears intact Immediate Memory Recall: Sock;Blue;Bed Memory Recall: Sock;Blue;Bed Memory Recall Sock: Without Cue Memory Recall Blue: Without Cue Memory Recall Bed: Without Cue Attention: Focused Focused Attention: Appears intact Awareness: Appears intact Problem Solving: Appears intact Safety/Judgment: Appears intact Sensation Sensation Light Touch: Appears Intact Stereognosis: Not tested Hot/Cold: Appears Intact Proprioception: Appears Intact Coordination Gross Motor Movements are Fluid and Coordinated: No (R LE weakness and decreased coordination with rolling) Fine Motor Movements are Fluid and Coordinated: Yes Finger Nose Finger Test: Intact Motor  Motor Motor: Abnormal postural alignment and control Motor  - Skilled Clinical Observations: significant trunk weakness when sitting at EOB  Mobility  Bed Mobility Bed Mobility: Rolling Right;Rolling Left Rolling Right: 3: Mod assist Rolling Right Details: Verbal cues for precautions/safety;Verbal cues for technique;Verbal cues for sequencing Rolling Left: 3: Mod assist  Trunk/Postural Assessment  Cervical Assessment Cervical Assessment: Within Functional Limits Thoracic Assessment Thoracic Assessment: Exceptions to [facility] (flexed) Lumbar Assessment Lumbar Assessment: Exceptions to [facility] (posterior pelvic tilt) Postural Control Postural Control: Deficits on evaluation (global trunk weakness, required bilateral UE support to maintain dynamic sitting balance)  Balance Balance Balance Assessed: Yes Extremity/Trunk Assessment RUE Assessment RUE Assessment: Within Functional Limits (4+/5) LUE Assessment LUE Assessment: Within Functional Limits (4/5 MMT)   See Function Navigator for Current Functional Status.   Refer to Care Plan for Long Term Goals  Recommendations for other services: None  Discharge Criteria: Patient will be discharged from OT if patient refuses treatment 3 consecutive times without medical reason, if treatment goals not met, if there is a change in medical status, if patient makes no progress towards goals or if patient is discharged from hospital.  The above assessment, treatment plan, treatment alternatives and goals were discussed and mutually agreed upon: by patient and by family  Abundio Miu 02/23/2016, 12:33 PM

## 2016-02-23 NOTE — Plan of Care (Signed)
Problem: RH BLADDER ELIMINATION Goal: RH STG MANAGE BLADDER WITH ASSISTANCE STG Manage Bladder With mod Assistance  Outcome: Not Progressing Foley at this time     

## 2016-02-24 ENCOUNTER — Inpatient Hospital Stay (HOSPITAL_COMMUNITY): Payer: Managed Care, Other (non HMO) | Admitting: Physical Therapy

## 2016-02-24 ENCOUNTER — Inpatient Hospital Stay (HOSPITAL_COMMUNITY): Payer: Managed Care, Other (non HMO)

## 2016-02-24 NOTE — Progress Notes (Signed)
ccupational Therapy Session Note  Patient Details  Name: Brandy Hardy MRN: [id number] Date of Birth: 1956-01-10  Today's Date: 02/24/2016 OT Individual Time: 1415-1530 OT Individual Time Calculation (min): 75 min   Short Term Goals: Week 1:  OT Short Term Goal 1 (Week 1): Pt will complete toilet transfer with LRAD and 1 helper OT Short Term Goal 2 (Week 1): Pt will complete UB bathing with supervision while maintaining sitting balance  OT Short Term Goal 3 (Week 1): Pt will complete LB bathing with Min A and AE as needed   Skilled Therapeutic Interventions/Progress Updates:   Therapeutic activity (60 min) with focus on improved activity tolerance, bed mobility, transfers, functional mobility using RW and sitting balance.  Pt received supine in bed, alert and receptive for treatment to include transfers and functional mobility using RW.   Pt reported poor fit with w/c due to height of w/c (19" from floor to seat) providing inadequate access and support during transfers.   OT attempted exchange with similar w/c however no other w/c offered lower seat height at time of treatment.   With setup to provide recliner vs w/c and +2 assist for safety, pt performed bed mobility unassisted using bed rail and HOB elevate.  Pt completed SPT using RW to w/c with mod assist of 1, +2 available for safety.   Pt was then escorted to gym and instructed on use of Sci-Fit to improve general endurance and activity tolerance (therapeutic exercise x 15 min).    Pt then performed 5 min of sustained seated UE ergometry in recliner, level 3 resistance, random routine, with good pace (40-50 rpm range) and rested for 3 minutes d/t fatigue (HR was no greater than 105 bpm, recovering to 94 bpm in 3 minutes).   Pt resumed ergometry for another 3 minutes and was escorted back to her room.   Pt then completed SPT from recliner to bed with min instructional cues, mod assist of 1, sustaining static standing balance for 75 seconds prior  to transfer to allow OT to perform cleansing of her buttocks which was mildly soiled from exertion during therex.   Pt recovered to side lying with mod assist to lift both legs and max assist for bed mobility at end of session.  Therapy Documentation Precautions:  Precautions Precautions: Fall Precaution Comments: R groin abscess, port Restrictions Weight Bearing Restrictions: No   Vital Signs: Therapy Vitals Temp: 99 F (37.2 C) Temp Source: Oral Pulse Rate: 87 Resp: 18 BP: (!) 119/49 Patient Position (if appropriate): Lying Oxygen Therapy SpO2: 95 % O2 Device: Not Delivered   Pain: Pain Assessment Pain Assessment: No/denies pain   ADL: ADL ADL Comments: Please see functional navigator for ADL status   Exercises: Cardiovascular Exercises Upper Body Ergometer: 15 min, Sci-Fit, level 3 5 min, level 2 3 min  Therapy/Group: Individual Therapy  [place] 02/24/2016, 3:53 PM

## 2016-02-24 NOTE — Progress Notes (Signed)
  Subjective: Dressing changed early AM, will look at it tomorrow with nursing staff.   Objective: Vital signs in last 24 hours: Temp:  [98.4 F (36.9 C)-99.1 F (37.3 C)] 98.4 F (36.9 C) (11/19 0509) Pulse Rate:  [83-87] 83 (11/19 0509) Resp:  [20] 20 (11/19 0509) BP: (109-136)/(46-55) 136/55 (11/19 0509) SpO2:  [95 %-97 %] 97 % (11/19 0509) Last BM Date: 02/23/16 1320 PO Urine 3150 BM x 1 Afebrile, VSS WBC stable  Intake/Output from previous day: 11/18 0701 - 11/19 0700 In: 1320 [P.O.:1320] Out: 3150 [Urine:3150] Intake/Output this shift: Total I/O In: -  Out: 450 [Urine:450]  General appearance: alert, cooperative and no distress Dressing just changed, nursing reports it looks pink. Nothing necrotic or odor.   Lab Results:   Recent Labs  02/22/16 0635 02/23/16 0700  WBC 10.1 10.7*  HGB 8.5* 8.5*  HCT 25.9* 26.6*  PLT 340 378    BMET  Recent Labs  02/23/16 0700  NA 137  K 3.7  CL 100*  CO2 28  GLUCOSE 98  BUN <5*  CREATININE 0.72  CALCIUM 8.2*   PT/INR No results for input(s): LABPROT, INR in the last 72 hours.   Recent Labs Lab 02/18/16 1109 02/23/16 0700  AST 43* 26  ALT 33 24  ALKPHOS 86 76  BILITOT 0.6 0.4  PROT 4.0* 4.7*  ALBUMIN 1.0* 1.5*     Lipase  No results found for: LIPASE   Studies/Results: No results found.  Medications: . atorvastatin  40 mg Oral Daily  . enoxaparin (LOVENOX) injection  40 mg Subcutaneous Q24H  . feeding supplement (ENSURE ENLIVE)  237 mL Oral TID WC  . fluconazole  200 mg Oral Daily  .  morphine injection  2 mg Intravenous BID  . multivitamin with minerals  1 tablet Oral Daily  . piperacillin-tazobactam (ZOSYN)  IV  3.375 g Intravenous Q8H  . senna  1 tablet Oral BID  . sodium chloride flush  10-40 mL Intracatheter Q12H    Assessment/Plan  Right groin necrotizing fasciitis, right thigh abscess 1. S/p incision and drainage of right thigh abscess, full thickness debridement of 40cm^2  right groin for necrotizing fasciitis11/11 Dr. Kieth Brightly 2. Debridement of skin subcutaneous tissue and fascia right groin wound; Debridement to be monitored skin subcutaneous tissue of right thigh wound11/15 Dr. Dalbert Batman - 11/11 culture: MIXED ANAEROBIC FLORA PRESENT - WBC 11.6, afebrile 3.  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  I told her that we would like to get this [place] is possible.  His lower quadrant voiding trial, or at most urology consult for urodynamics. 4.  History cervical cancer, TAH/BSO, more than 1 course of radiation therapy.  This is the likely source of her periurethral ulcers, which she states have been there for a long time.  This will also impair wound healing.  ID - zosyn 11/9>>, vanc 11/9>>, diflucan 11/9>> FEN - heart healthy VTE - Lovenox  Plan:  We will look at dressing with staff tomorrow.  I have as them to change times to allow [place] to view during AM rounds.    LOS: 2 days    Brandy Hardy 02/24/2016 [phone]

## 2016-02-24 NOTE — Progress Notes (Signed)
Occupational Therapy Session Note  Patient Details  Name: Brandy Hardy MRN: [id number] Date of Birth: 09-01-1955  Today's Date: 02/24/2016 OT Individual Time: 0900-1000 OT Individual Time Calculation (min): 60 min     Short Term Goals: Week 1:  OT Short Term Goal 1 (Week 1): Pt will complete toilet transfer with LRAD and 1 helper OT Short Term Goal 2 (Week 1): Pt will complete UB bathing with supervision while maintaining sitting balance  OT Short Term Goal 3 (Week 1): Pt will complete LB bathing with Min A and AE as needed   Skilled Therapeutic Interventions/Progress Updates:    Pt resting in bed upon arrival and agreeable to therapy.  Pt declined donning clothing secondary to R groin/upper thigh wound/dressing and IV port in R chest.  Pt completed bathing tasks seated EOB and donned a clean hospital gown.  Pt performed supine>sit EOB with min A to completed bathing tasks.  Pt performed sit<>stand with min A and took approx 6 steps for stand-step transfer.  Pt SOB after transfer.  Reviewed OT LTGs and purpose of Rehab.  Pt required more than a reasonable amount of time with multiple rest breaks to compete all tasks. Pt also engaged in sit<>stand from recliner X 5 with rest breaks.  Focus on activity tolerance, sit<>stand, functional transfers, discharge planning, and safety awareness to increase independence with BADLs.  Therapy Documentation Precautions:  Precautions Precautions: Fall Precaution Comments: R groin abscess, port Restrictions Weight Bearing Restrictions: No  Pain: Pt stated her pain level was "ok"  See Function Navigator for Current Functional Status.   Therapy/Group: Individual Therapy  [name] 02/24/2016, 10:01 AM

## 2016-02-24 NOTE — Progress Notes (Signed)
[place] PHYSICAL MEDICINE & REHABILITATION     PROGRESS NOTE    Subjective/Complaints: No new issues. Therapy went fairly well.   ROS: Pt denies fever, rash/itching, headache, blurred or double vision, nausea, vomiting, abdominal pain, diarrhea, chest pain, shortness of breath, palpitations, dysuria, dizziness, neck pain, back pain, bleeding, anxiety, or depression   Objective: Vital Signs: Blood pressure (!) 136/55, pulse 83, temperature 98.4 F (36.9 C), temperature source Oral, resp. rate 20, height 5\' 3"  (1.6 m), weight 135.6 kg (298 lb 15.1 oz), SpO2 97 %. No results found.  Recent Labs  02/22/16 0635 02/23/16 0700  WBC 10.1 10.7*  HGB 8.5* 8.5*  HCT 25.9* 26.6*  PLT 340 378    Recent Labs  02/23/16 0700  NA 137  K 3.7  CL 100*  GLUCOSE 98  BUN <5*  CREATININE 0.72  CALCIUM 8.2*   CBG (last 3)  No results for input(s): GLUCAP in the last 72 hours.  Wt Readings from Last 3 Encounters:  02/22/16 135.6 kg (298 lb 15.1 oz)  02/22/16 135 kg (297 lb 9.6 oz)  10/08/15 122.5 kg (270 lb)    Physical Exam:  Constitutional: NAD Obese female HENT:  Head: Normocephalicand atraumatic.  Mouth/Throat: Oropharynx is clear and moist.  Eyes: PERRL.  Neck: supple  Cardiovascular: RRR No murmurheard. Respiratory: CTA B No wheezes  GI: soft ,nt.  Musculoskeletal: Normal range of motion. She exhibits peripheral edema.  Neurological: She is alertand oriented to person, place, and time. UE 5/5, LE: 2/5-4/5 prox to distal Skin: Skin is warmand dry.   Granulation and adipose tissue in open wounds---fewer areas of fibronecrotic debris. Areas remain tender with palpation/manipulation Psychiatric: normal affect.   Assessment/Plan: 1. Functional and mobility deficits  secondary to sepsis/abscessed wounds which require 3+ hours per day of interdisciplinary therapy in a comprehensive inpatient rehab setting. Physiatrist is providing close team supervision and 24 hour  management of active medical problems listed below. Physiatrist and rehab team continue to assess barriers to discharge/monitor patient progress toward functional and medical goals.  Function:  Bathing Bathing position   Position:  (EOB UB ADLs and bedlevel LB ADLs)  Bathing parts Body parts bathed by patient: Right arm, Left arm, Chest, Abdomen, Front perineal area, Right upper leg, Left upper leg Body parts bathed by helper: Buttocks, Right lower leg, Left lower leg, Back  Bathing assist Assist Level: Touching or steadying assistance(Pt > 75%)      Upper Body Dressing/Undressing Upper body dressing   What is the patient wearing?: Hospital gown                Upper body assist Assist Level: Touching or steadying assistance(Pt > 75%)      Lower Body Dressing/Undressing Lower body dressing   What is the patient wearing?: Hospital Gown, Non-skid slipper socks           Non-skid slipper socks- Performed by helper: Don/doff right sock, Don/doff left sock                  Lower body assist Assist for lower body dressing:  (Max A)      Toileting Toileting Toileting activity did not occur: (P) Safety/medical concerns   Toileting steps completed by helper: Adjust clothing after toileting, Performs perineal hygiene, Adjust clothing prior to toileting    Toileting assist Assist level: Two helpers   Transfers Chair/bed transfer   Chair/bed transfer method: Ambulatory Chair/bed transfer assist level: 2 helpers Chair/bed transfer assistive device: Environmental consultant,  Nurse, adult          Cognition Comprehension Comprehension assist level: Follows complex conversation/direction with no assist  Expression Expression assist level: Expresses complex ideas: With no assist  Social Interaction Social Interaction assist level: Interacts appropriately with others - No medications needed.  Problem Solving Problem solving assist level:  Solves complex problems: Recognizes & self-corrects  Memory Memory assist level: Complete Independence: No helper   Medical Problem List and Plan: 1. Functional and mobility deficitssecondary to inguinal/thigh abscess and sepsis with subsequent debility -continue CIR therapies 2. DVT Prophylaxis/Anticoagulation: Pharmaceutical: Lovenox 3. Pain Management: Premedicate with oxycodone prior to dressing changes  4. Mood: Motivated to put best effort and get home. LCSW to follow for evaluation and support.  5. Neuropsych: This patient iscapable of making decisions on herown behalf. 6. Skin/Wound Care: Wet to dry dressing changes bid. Husband in process of learning wound care.--continue to educate other family members.   -keep foley in to avoid contamination of wound and to help wound heal. Nursing reports needing to change dressing 4-5 times day before foley placed.   -consider removing foley this week 7. Fluids/Electrolytes/Nutrition: encourage PO   8. Necrotizing fasciitis with right groin abscess s/p I and D: To continue wet to dry dressing changed to right mons/thigh wound. Daily showers recommended.   -IV Zosyn/Diflucan D # 19/21  9. Morbid obesity: Encourage appropriate diet and weight loss. Also discussed increasing activity level as patient sedentary. She reports that she has lost 50 lbs in the past 2 years and off BP/DM meds.    - dietician helping with weight loss diet.  10. Reactive leucocytosis: 10.7, stable to decreased.  11. Stress induced hyperglycemia: Hgb A1c- 5.6 (10/2015). Added CM restrictions to diet.  12. Critical illness anemia: 11.5-->8.5---recheck Monday  -no obvious blood loss  13. 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   -helping sleep  -voiding trial this week  LOS (Days) 2 A FACE TO FACE EVALUATION WAS PERFORMED  SWARTZ,ZACHARY T 02/24/2016 8:10 AM

## 2016-02-24 NOTE — Progress Notes (Signed)
Physical Therapy Session Note  Patient Details  Name: Brandy Hardy MRN: [id number] Date of Birth: 28-Jun-1955  Today's Date: 02/24/2016 PT Individual Time: [id number] PT Individual Time Calculation (min): 53 min    Short Term Goals: Week 1:  PT Short Term Goal 1 (Week 1): Patient will transfer sit <> stand using RW with assist of 1 person. PT Short Term Goal 2 (Week 1): Patient will perform bed mobility with consistent min A.  PT Short Term Goal 3 (Week 1): Patient will ambulate 25 ft with min A using LRAD. PT Short Term Goal 4 (Week 1): Patient will initiate stair training.   Skilled Therapeutic Interventions/Progress Updates:    Pt received in bed & agreeable to tx, noting 0/10 pain at rest but 5/10 RLE pain with weight bearing & activity. Pt transferred supine>sitting EOB with use of bed rails & HOB elevated, supervision overall, and extra time to complete task. Pt reported she is unable to sit in w/c due to height of seat. Pt completed stand pivot bed>recliner with RW & steady assist. Educated pt on lateral weight shifts to scoot buttocks back in chair & pt able to do so with extra time and rest breaks as needed. In gym, pt ambulated 6 ft + 6 ft with RW & steady assist; pt unable to clear RLE from floor and instead scoots it along, pt also with increased pain with weightbearing through RLE. Educated pt on proper hand placement and technique for sit<>stand transfers with fair demo. Pt performed beach ball taps with 1lb bar weight focusing on activity tolerance; pt able to complete task < or = 1 minute at a time before requiring rest break. At end of session pt left sitting in recliner with all needs within reach, BLE elevated & set up with meal tray.  PT observed pt to have pink drainage, potentially from RLE, & RN made aware.   Therapy Documentation Precautions:  Precautions Precautions: Fall Precaution Comments: R groin abscess, port Restrictions Weight Bearing Restrictions:  No   See Function Navigator for Current Functional Status.   Therapy/Group: Individual Therapy  Waunita Schooner 02/24/2016, 5:26 PM

## 2016-02-25 ENCOUNTER — Inpatient Hospital Stay (HOSPITAL_COMMUNITY): Payer: Managed Care, Other (non HMO)

## 2016-02-25 ENCOUNTER — Inpatient Hospital Stay (HOSPITAL_COMMUNITY): Payer: Managed Care, Other (non HMO) | Admitting: Physical Therapy

## 2016-02-25 ENCOUNTER — Inpatient Hospital Stay (HOSPITAL_COMMUNITY): Payer: Managed Care, Other (non HMO) | Admitting: Occupational Therapy

## 2016-02-25 LAB — CBC
HEMATOCRIT: 25.5 % — AB (ref 36.0–46.0)
HEMOGLOBIN: 8.1 g/dL — AB (ref 12.0–15.0)
MCH: 30.3 pg (ref 26.0–34.0)
MCHC: 31.8 g/dL (ref 30.0–36.0)
MCV: 95.5 fL (ref 78.0–100.0)
Platelets: 437 10*3/uL — ABNORMAL HIGH (ref 150–400)
RBC: 2.67 MIL/uL — AB (ref 3.87–5.11)
RDW: 16.9 % — ABNORMAL HIGH (ref 11.5–15.5)
WBC: 10 10*3/uL (ref 4.0–10.5)

## 2016-02-25 MED ORDER — PRO-STAT SUGAR FREE PO LIQD
30.0000 mL | Freq: Two times a day (BID) | ORAL | Status: DC
  Administered 2016-02-25 – 2016-03-05 (×19): 30 mL via ORAL
  Filled 2016-02-25 (×19): qty 30

## 2016-02-25 MED ORDER — VITAMIN C 500 MG PO TABS
500.0000 mg | ORAL_TABLET | Freq: Two times a day (BID) | ORAL | Status: DC
  Administered 2016-02-25 – 2016-03-05 (×19): 500 mg via ORAL
  Filled 2016-02-25 (×19): qty 1

## 2016-02-25 MED ORDER — ATORVASTATIN CALCIUM 40 MG PO TABS
40.0000 mg | ORAL_TABLET | Freq: Every day | ORAL | Status: DC
  Administered 2016-03-04 – 2016-03-05 (×2): 40 mg via ORAL
  Filled 2016-02-25 (×2): qty 1

## 2016-02-25 MED ORDER — OXYCODONE HCL 5 MG PO TABS
10.0000 mg | ORAL_TABLET | Freq: Two times a day (BID) | ORAL | Status: DC
  Administered 2016-02-25 – 2016-02-27 (×3): 10 mg via ORAL
  Filled 2016-02-25 (×4): qty 2

## 2016-02-25 NOTE — Progress Notes (Signed)
Occupational Therapy Session Note  Patient Details  Name: Tazaria Billock MRN: [id number] Date of Birth: 1955/08/12  Today's Date: 02/25/2016 OT Individual Time: 0700-0757 OT Individual Time Calculation (min): 57 min     Short Term Goals: Week 1:  OT Short Term Goal 1 (Week 1): Pt will complete toilet transfer with LRAD and 1 helper OT Short Term Goal 2 (Week 1): Pt will complete UB bathing with supervision while maintaining sitting balance  OT Short Term Goal 3 (Week 1): Pt will complete LB bathing with Min A and AE as needed   Skilled Therapeutic Interventions/Progress Updates:    Pt engaged in bathing/dressing tasks seated EOB.  Pt performed supine>sit EOB with supervision using bed rails and HOB elevated.  Pt completed bathing tasks and donning housecoat at supervision level.  Pt required tot A for donning socks.  Pt started eating breakfast at EOB but became fatigued and requested to lay back with HOB elevated to complete breakfast.  Pt required max A for sit>supine but was able to scoot towards HOB using BUE to pull on bed rails.  Pt remained in bed awaiting MD for wound care.  Focus on activity tolerance, bed mobility, and sitting balance to increase independence with BADLs.  Therapy Documentation Precautions:  Precautions Precautions: Fall Precaution Comments: R groin abscess, port Restrictions Weight Bearing Restrictions: No   Pain: Pain Assessment Pain Assessment: No/denies pain  See Function Navigator for Current Functional Status.   Therapy/Group: Individual Therapy  [name] 02/25/2016, 8:01 AM

## 2016-02-25 NOTE — Progress Notes (Signed)
Social Work  Social Work Assessment and Plan  Patient Details  Name: Brandy Hardy MRN: [id number] Date of Birth: 09/18/1955  Today's Date: 02/25/2016  Problem List:  Patient Active Problem List   Diagnosis Date Noted  . Debility 02/22/2016  . Abscess of right groin 02/20/2016  . Fever   . History of cervical cancer   . Acute blood loss anemia   . Anemia of chronic disease   . Tachypnea   . Lymphocytosis   . Abscess of groin, right   . Super obese (HCC)   . Sepsis (HCC)   . Septic shock (HCC) 02/14/2016  . UTI (urinary tract infection)   . Obesity, morbid (HCC) 02/21/2013  . Essential hypertension 11/23/2012  . Type II or unspecified type diabetes mellitus without mention of complication, not stated as uncontrolled 11/23/2012  . Other and unspecified hyperlipidemia 11/23/2012   Past Medical History:  Past Medical History:  Diagnosis Date  . Cervical adenocarcinoma (HCC)    stage II  . Endometrial cancer (HCC)    with recurrance at introitus   . History of prediabetes   . 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 ABDOMINAL HYSTERECTOMY    . I&D EXTREMITY Right 02/20/2016   Procedure: IRRIGATION AND DEBRIDEMENT EXTREMITY RIGHT UPPER THIGH;  Surgeon: Claud Kelp, MD;  Location: [facility] OR;  Service: General;  Laterality: Right;  . IRRIGATION AND DEBRIDEMENT ABSCESS Right 02/16/2016   Procedure: IRRIGATION AND DEBRIDEMENT RIGHT GROIN ABSCESS, FULL THICKNESS DEBRIDMENT RIGHT GROIN INCLUDING > 40CM SQUARE.;  Surgeon: De Blanch Kinsinger, MD;  Location: [facility] OR;  Service: General;  Laterality: Right;  . IRRIGATION AND DEBRIDEMENT ABSCESS Right 02/20/2016   Procedure: IRRIGATION AND DEBRIDEMENT ABSCESS;  Surgeon: Claud Kelp, MD;  Location: [facility] OR;  Service: General;  Laterality: Right;  Perii-Vaginal Abscess   Social History:  reports that she has never smoked. She has never  used smokeless tobacco. She reports that she does not drink alcohol or use drugs.  Family / Support Systems Marital Status: Married How Long?: 33 yrs Patient Roles: Spouse, Parent Spouse/Significant Other: husband, Brandy Hardy @ (C) [phone]  Children: daughter, Brandy Hardy (40) living with pt/ husband and works nights. Other Supports: Pt's sister, Brandy Hardy, also living with them and works 2nd shift. Anticipated Caregiver: Spouse, Sister, and daughter  Ability/Limitations of Caregiver: Spouse works days, but patient's sister and daughter are intermittently available since they work nights  Caregiver Availability: 24/7 Family Dynamics: Pt describes all family as very supportive and denies any concern about their willingness to assist after d/c.  Pt becomes a little teaful when she talks about how supportive her husband has been through all of her cancer txs and with this wound care.  Social History Preferred language: English Religion: Christian Cultural Background: NA Read: Yes Write: Yes Employment Status: Employed Name of Employer: [employer] Return to Work Plans: Pt hopes to be able to return to her work (actually works from home) by January Legal Hisotry/Current Legal Issues: None Guardian/Conservator: None - per MD, pt is capable of making decisions on her own behalf.   Abuse/Neglect Physical Abuse: Denies Verbal Abuse: Denies Sexual Abuse: Denies Exploitation of patient/patient's resources: Denies Self-Neglect: Denies  Emotional Status Pt's affect, behavior adn adjustment status: Pt very pleasant and talks easily with me about her significant health issues over the past few years.  She admits to much frustration with wound  issues and placing more burden on her husband and family.  She denies any s/s of emotional distress.  Tearful only when talking about how "good he's (spouse) been about looking at them (wounds) and learning to take care of them"  Will monitor mood and refer for  neuropsychology as indicated. Recent Psychosocial Issues: Cancer with txs over the past couple of years. Pyschiatric History: None Substance Abuse History: None  Patient / Family Perceptions, Expectations & Goals Pt/Family understanding of illness & functional limitations: Pt and family with good understanding of her medical issues and current state of debility/ need for CIR. Premorbid pt/family roles/activities: Pt was independent PTA and working f/t time from home.  Anticipated changes in roles/activities/participation: Appears that overall goals being set for supervision.  Husband and other family in the home to continue with their caregiver support. Pt/family expectations/goals: "I'd just like to have this healed as much as possible."  [employer]: None Premorbid Home Care/DME Agencies: None Transportation available at discharge: yes  Discharge Planning Living Arrangements: Spouse/significant other, Children, Other relatives Support Systems: Spouse/significant other, Children, Other relatives, Friends/neighbors Type of Residence: Private residence Insurance Resources: Media planner (specify) Administrator) Financial Resources: Employment Financial Screen Referred: No Living Expenses: Own Does the patient have any problems obtaining your medications?: No Home Management: pt and family Patient/Family Preliminary Plans: Pt to return to her own home with family to provide any needed assistance. Social Work Anticipated Follow Up Needs: HH/OP Expected length of stay: 14-17 days   Clinical Impression Very pleasant woman here following I&D of wounds and very debilitated.  Good family support and able to provide very close to 24/7 supervision.  She talks openly about her health and her frustrations with current issues, however, denies any significant emotional distress.  Very motivated for CIR txs.  Will follow for support and d/c planning needs.  Brandy Hardy,  Brandy Hardy 02/25/2016, 12:48 PM

## 2016-02-25 NOTE — Progress Notes (Signed)
02/25/16 11:08  Reviewed record, no need for CONTACT precautions if no difficulty containing wound drainage.  Luther Hearing RN BSN Infection Prevention

## 2016-02-25 NOTE — IPOC Note (Signed)
Overall Plan of Care [facility]) Patient Details Name: Brandy Hardy MRN: [id number] DOB: December 21, 1955  Admitting Diagnosis: debility  Hospital Problems: Principal Problem:   Debility Active Problems:   Essential hypertension   Obesity, morbid (HCC)   Septic shock (HCC)   Acute blood loss anemia   Abscess of right groin     Functional Problem List: Nursing Bladder, Bowel, Edema, Endurance, Medication Management, Motor, Pain, Nutrition, Sensory, Skin Integrity  PT Balance, Edema, Endurance, Motor, Nutrition, Pain, Safety, Skin Integrity  OT Balance, Safety, Skin Integrity, Edema, Endurance, Motor  SLP    TR         Basic ADL's: OT Grooming, Bathing, Dressing, Toileting     Advanced  ADL's: OT Simple Meal Preparation, Laundry     Transfers: PT Bed Mobility, Bed to Chair, Car, Occupational psychologist, Research scientist (life sciences): PT Ambulation, Psychologist, prison and probation services, Stairs     Additional Impairments: OT    SLP        TR      Anticipated Outcomes Item Anticipated Outcome  Self Feeding N/A  Swallowing      Basic self-care  Supervision-Min A  Toileting  Min A   Bathroom Transfers Supervision  Bowel/Bladder  Pt will manage bladder with total assist and bowel with min assist   Transfers  supervision  Locomotion  supervision household  Communication     Cognition     Pain  Patient will manage pain at 4 or less on a scale of 0-10.   Safety/Judgment  Pt will remain free of falls and injury with min cues/ assist    Therapy Plan: PT Intensity: Minimum of 1-2 x/day ,45 to 90 minutes PT Frequency: 5 out of 7 days PT Duration Estimated Length of Stay: 2-3 weeks OT Intensity: Minimum of 1-2 x/day, 45 to 90 minutes OT Frequency: 5 out of 7 days OT Duration/Estimated Length of Stay: 23-25 days         Team Interventions: Nursing Interventions Patient/Family Education, Bladder Management, Bowel Management, Disease Management/Prevention, Pain Management, Medication  Management, Skin Care/Wound Management, Discharge Planning  PT interventions Ambulation/gait training, Balance/vestibular training, Community reintegration, Discharge planning, Disease management/prevention, DME/adaptive equipment instruction, Functional mobility training, Neuromuscular re-education, Pain management, Patient/family education, Psychosocial support, Skin care/wound management, Stair training, Therapeutic Activities, Therapeutic Exercise, UE/LE Coordination activities, UE/LE Strength taining/ROM, Wheelchair propulsion/positioning  OT Interventions Discharge planning, Self Care/advanced ADL retraining, Therapeutic Activities, Functional mobility training, Patient/family education, Therapeutic Exercise, Psychosocial support, DME/adaptive equipment instruction, UE/LE Strength taining/ROM  SLP Interventions    TR Interventions    SW/CM Interventions Discharge Planning, Psychosocial Support, Patient/Family Education    Team Discharge Planning: Destination: PT-Home ,OT- Home , SLP-  Projected Follow-up: PT-Home health PT, 24 hour supervision/assistance, OT-  Home health OT, SLP-  Projected Equipment Needs: PT-To be determined, OT- To be determined, SLP-  Equipment Details: PT- , OT-  Patient/family involved in discharge planning: PT- Patient,  OT-Patient, Family member/caregiver, SLP-   MD ELOS: 15-20 days Medical Rehab Prognosis:  Excellent Assessment: The patient has been admitted for CIR therapies with the diagnosis of debility after abscessed wound/sepsis. The team will be addressing functional mobility, strength, stamina, balance, safety, adaptive techniques and equipment, self-care, bowel and bladder mgt, patient and caregiver education, pain mgt, wound care, activity tolerance, weight mgt, . Goals have been set at supervision for basic transfers and mobility/locomotion and supervision to min assist with self-care and ADL's. Husband to be educated on wound care during say---he is quite  engaged.Ranelle Oyster, MD, FAAPMR      See Team Conference Notes for weekly updates to the plan of care

## 2016-02-25 NOTE — Progress Notes (Signed)
Slept good during night. Only complains of pain when dressing is changed or when OOB with therapy. Brandy Hardy

## 2016-02-25 NOTE — Progress Notes (Signed)
Occupational Therapy Session Note  Patient Details  Name: Brandy Hardy MRN: [id number] Date of Birth: 1956/03/11  Today's Date: 02/25/2016 OT Individual Time: [id number] OT Individual Time Calculation (min): 59 min     Short Term Goals: Week 1:  OT Short Term Goal 1 (Week 1): Pt will complete toilet transfer with LRAD and 1 helper OT Short Term Goal 2 (Week 1): Pt will complete UB bathing with supervision while maintaining sitting balance  OT Short Term Goal 3 (Week 1): Pt will complete LB bathing with Min A and AE as needed   Skilled Therapeutic Interventions/Progress Updates:     Upon entering the room, pt seated in recliner chair and agreeable to OT intervention. Pt performed sit >stand with min A and use of RW. Stand pivot transfer with min A to wheelchair. Pt propelled self 30' to ADL apartment with B UEs for strengthening and endurance. Pt standing at kitchen counter and obtaining items from cabinet by reaching out of base of support with steady assistance. Pt standing for a total of 6 minutes before returning to wheelchair. Pt taking seated rest break and propelling wheelchair back to room in same manner. Stand pivot from wheelchair to bed with min A and use of RW. Pt needing mod A for B LEs into bed. Call bell and all needed items within reach upon exiting the room.   Therapy Documentation Precautions:  Precautions Precautions: Fall Precaution Comments: R groin abscess, port Restrictions Weight Bearing Restrictions: No Vital Signs: Therapy Vitals Temp: 98.3 F (36.8 C) Temp Source: Oral Pulse Rate: 88 Resp: 18 BP: (!) 125/56 Patient Position (if appropriate): Sitting Oxygen Therapy SpO2: 100 % O2 Device: Not Delivered ADL: ADL ADL Comments: Please see functional navigator for ADL status  See Function Navigator for Current Functional Status.   Therapy/Group: Individual Therapy  Gypsy Decant 02/25/2016, 4:06 PM

## 2016-02-25 NOTE — Progress Notes (Signed)
[place] PHYSICAL MEDICINE & REHABILITATION     PROGRESS NOTE    Subjective/Complaints: Had a good night. Busy with therapy yesterday. Pain under control at present.   ROS: Pt denies fever, rash/itching, headache, blurred or double vision, nausea, vomiting, abdominal pain, diarrhea, chest pain, shortness of breath, palpitations, dysuria, dizziness, neck pain, back pain, bleeding, anxiety, or depression    Objective: Vital Signs: Blood pressure (!) 128/44, pulse 84, temperature 98.9 F (37.2 C), temperature source Oral, resp. rate 18, height 5\' 3"  (1.6 m), weight 135.6 kg (298 lb 15.1 oz), SpO2 94 %. No results found.  Recent Labs  02/23/16 0700 02/25/16 0456  WBC 10.7* 10.0  HGB 8.5* 8.1*  HCT 26.6* 25.5*  PLT 378 437*    Recent Labs  02/23/16 0700  NA 137  K 3.7  CL 100*  GLUCOSE 98  BUN <5*  CREATININE 0.72  CALCIUM 8.2*   CBG (last 3)  No results for input(s): GLUCAP in the last 72 hours.  Wt Readings from Last 3 Encounters:  02/22/16 135.6 kg (298 lb 15.1 oz)  02/22/16 135 kg (297 lb 9.6 oz)  10/08/15 122.5 kg (270 lb)    Physical Exam:  Constitutional: NAD obese HENT:  Head: NCAT.  Mouth/Throat: Oropharynx is clear and moist.  Eyes: EOMI  Neck: supple  Cardiovascular: RRR Respiratory:cta b  GI: bs+.  Musculoskeletal: Normal range of motion. She exhibits peripheral edema.  Neurological: She is alertand oriented to person, place, and time. UE 5/5, LE: 2/5-4/5 prox to distal Skin: Skin is warmand dry.   Granulation and adipose tissue in groin/thigh wound.  Areas remain tender with palpation/manipulation Psychiatric: normal affect.   Assessment/Plan: 1. Functional and mobility deficits  secondary to sepsis/abscessed wounds which require 3+ hours per day of interdisciplinary therapy in a comprehensive inpatient rehab setting. Physiatrist is providing close team supervision and 24 hour management of active medical problems listed  below. Physiatrist and rehab team continue to assess barriers to discharge/monitor patient progress toward functional and medical goals.  Function:  Bathing Bathing position   Position: Sitting EOB  Bathing parts Body parts bathed by patient: Right arm, Left arm, Chest, Abdomen, Right upper leg, Left upper leg Body parts bathed by helper: Buttocks, Front perineal area, Right lower leg, Left lower leg, Back  Bathing assist Assist Level: Touching or steadying assistance(Pt > 75%)      Upper Body Dressing/Undressing Upper body dressing   What is the patient wearing?: Button up shirt, Pull over shirt/dress     Pull over shirt/dress - Perfomed by patient: Thread/unthread right sleeve, Thread/unthread left sleeve, Put head through opening, Pull shirt over trunk   Button up shirt - Perfomed by patient: Thread/unthread right sleeve, Thread/unthread left sleeve, Pull shirt around back, Button/unbutton shirt      Upper body assist Assist Level: Supervision or verbal cues   Set up : To obtain clothing/put away  Lower Body Dressing/Undressing Lower body dressing   What is the patient wearing?: Non-skid slipper socks           Non-skid slipper socks- Performed by helper: Don/doff right sock, Don/doff left sock                  Lower body assist Assist for lower body dressing:  (Max A)      Toileting Toileting Toileting activity did not occur: Safety/medical concerns   Toileting steps completed by helper: Adjust clothing after toileting, Performs perineal hygiene, Adjust clothing prior to toileting  Toileting assist Assist level: Two helpers   Transfers Chair/bed transfer   Chair/bed transfer method: Stand pivot Chair/bed transfer assist level: Touching or steadying assistance (Pt > 75%) Chair/bed transfer assistive device: Medical sales representative     Max distance: 6 ft Assist level: Touching or steadying assistance (Pt > 75%)   Wheelchair           Cognition Comprehension Comprehension assist level: Follows complex conversation/direction with no assist  Expression Expression assist level: Expresses complex ideas: With no assist  Social Interaction Social Interaction assist level: Interacts appropriately with others - No medications needed.  Problem Solving Problem solving assist level: Solves complex problems: Recognizes & self-corrects  Memory Memory assist level: Complete Independence: No helper   Medical Problem List and Plan: 1. Functional and mobility deficitssecondary to inguinal/thigh abscess and sepsis with subsequent debility -continue CIR therapies  -remaoins motivated 2. DVT Prophylaxis/Anticoagulation: Pharmaceutical: Lovenox 3. Pain Management: Premedicate with oxycodone prior to dressing changes  4. Mood: Motivated to put best effort and get home. LCSW to follow for evaluation and support.  5. Neuropsych: This patient iscapable of making decisions on herown behalf. 6. Skin/Wound Care: Wet to dry dressing changes bid. Husband in process of learning wound care.--continue to educate other family members.   -keep foley in to avoid contamination of wound and to help wound heal. Nursing reports needing to change dressing 4-5 times day before foley placed.   -discussed with patient that we will likely remove this week. 7. Fluids/Electrolytes/Nutrition: encourage PO   8. Necrotizing fasciitis with right groin abscess s/p I and D: To continue wet to dry dressing changed to right mons/thigh wound. Daily showers recommended.   -IV Zosyn/Diflucan D # 20/21  9. Morbid obesity: Encourage appropriate diet and weight loss. Also discussed increasing activity level as patient sedentary. She reports that she has lost 50 lbs in the past 2 years and off BP/DM meds.    - dietician consult.  10. Reactive leucocytosis: 10.7, stable to decreased.  11. Stress induced hyperglycemia: Hgb A1c- 5.6 (10/2015). Added CM  restrictions to diet.  12. Critical illness anemia: 11.5-->8.5 > 8.1---labs personally reviewed  -continue to follow serially  -recheck tomorrow  -no obvious blood loss  13. 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   -helping sleep  -voiding trial this week as above  LOS (Days) 3 A FACE TO FACE EVALUATION WAS PERFORMED  SWARTZ,ZACHARY T 02/25/2016 8:46 AM

## 2016-02-25 NOTE — Progress Notes (Signed)
Patient ID: Brandy Hardy, female   DOB: Mar 25, 1956, 60 y.o.   MRN: [id number]  [facility] Surgery Progress Note     Subjective: No complaints today. Tolerating dressing changes.   Objective: Vital signs in last 24 hours: Temp:  [98.9 F (37.2 C)-99 F (37.2 C)] 98.9 F (37.2 C) (11/20 0525) Pulse Rate:  [84-87] 84 (11/20 0525) Resp:  [18] 18 (11/20 0525) BP: (119-128)/(44-49) 128/44 (11/20 0525) SpO2:  [94 %-95 %] 94 % (11/20 0525) Last BM Date: 02/24/16  Intake/Output from previous day: 11/19 0701 - 11/20 0700 In: 1080 [P.O.:1080] Out: 3300 [Urine:3300] Intake/Output this shift: Total I/O In: 3 [I.V.:3] Out: -   PE: Gen:  Alert, NAD, pleasant Pulm:  Effort normal Abd: Soft, NT/ND Wounds: healing well, no purulent drainage of foul odor       Lab Results:   Recent Labs  02/23/16 0700 02/25/16 0456  WBC 10.7* 10.0  HGB 8.5* 8.1*  HCT 26.6* 25.5*  PLT 378 437*   BMET  Recent Labs  02/23/16 0700  NA 137  K 3.7  CL 100*  CO2 28  GLUCOSE 98  BUN <5*  CREATININE 0.72  CALCIUM 8.2*   PT/INR No results for input(s): LABPROT, INR in the last 72 hours. CMP     Component Value Date/Time   NA 137 02/23/2016 0700   K 3.7 02/23/2016 0700   CL 100 (L) 02/23/2016 0700   CO2 28 02/23/2016 0700   GLUCOSE 98 02/23/2016 0700   BUN <5 (L) 02/23/2016 0700   CREATININE 0.72 02/23/2016 0700   CALCIUM 8.2 (L) 02/23/2016 0700   PROT 4.7 (L) 02/23/2016 0700   ALBUMIN 1.5 (L) 02/23/2016 0700   AST 26 02/23/2016 0700   ALT 24 02/23/2016 0700   ALKPHOS 76 02/23/2016 0700   BILITOT 0.4 02/23/2016 0700   GFRNONAA >60 02/23/2016 0700   GFRAA >60 02/23/2016 0700   Lipase  No results found for: LIPASE     Studies/Results: No results found.  Anti-infectives: Anti-infectives    Start     Dose/Rate Route Frequency Ordered Stop   02/23/16 0800  fluconazole (DIFLUCAN) tablet 200 mg     200 mg Oral Daily 02/22/16 1833     02/22/16 2200   piperacillin-tazobactam (ZOSYN) IVPB 3.375 g     3.375 g 12.5 mL/hr over 240 Minutes Intravenous Every 8 hours 02/22/16 1833         Assessment/Plan Right groin necrotizing fasciitis, right thigh abscess 1. S/p incision and drainage of right thigh abscess, full thickness debridement of 40cm^2 right groin for necrotizing fasciitis11/11 Dr. Kieth Brightly 2. Debridement of skin subcutaneous tissue and fascia right groin wound; Debridement to be monitored skin subcutaneous tissue of right thigh wound11/15 Dr. Dalbert Batman - 11/11 culture: MIXED ANAEROBIC FLORA PRESENT - WBC 10.0  Urinary retention and periurethral ulcers -chronic Foley catheter in place. continue for now to keep wound clean. Could consider voiding trial. Could consider urology consult  History cervical cancer, TAH/BSO, more than 1 course of radiation therapy.  ID - zosyn 11/9>>, diflucan 11/9>> FEN - heart healthy VTE - Lovenox  Plan:  wounds healing well. Continue BID wet to dry dressing changes and daily showers. Continue antibiotics (zosyn, diflucan).     LOS: 3 days    Jerrye Beavers , [facility] Surgery 02/25/2016, 8:53 AM Pager: [phone] Consults: [phone] Mon-Fri 7:00 am-4:30 pm Sat-Sun 7:00 am-11:30 am

## 2016-02-25 NOTE — Progress Notes (Signed)
Patient information reviewed and entered into eRehab system by Melani Brisbane, RN, CRRN, PPS Coordinator.  Information including medical coding and functional independence measure will be reviewed and updated through discharge.    

## 2016-02-25 NOTE — Care Management Note (Signed)
  [place] Individual Statement of Services  Patient Name:  Brandy Hardy  Date:  02/25/2016  Welcome to the [facility].  Our goal is to provide you with an individualized program based on your diagnosis and situation, designed to meet your specific needs.  With this comprehensive rehabilitation program, you will be expected to participate in at least 3 hours of rehabilitation therapies Monday-Friday, with modified therapy programming on the weekends.  Your rehabilitation program will include the following services:  Physical Therapy (PT), Occupational Therapy (OT), 24 hour per day rehabilitation nursing, Therapeutic Recreaction (TR), Case Management (Social Worker), Rehabilitation Medicine, Nutrition Services and Pharmacy Services  Weekly team conferences will be held on Tuesdays to discuss your progress.  Your Social Worker will talk with you frequently to get your input and to update you on team discussions.  Team conferences with you and your family in attendance may also be held.  Expected length of stay: 2-3 weeks  Overall anticipated outcome: supervision  Depending on your progress and recovery, your program may change. Your Social Worker will coordinate services and will keep you informed of any changes. Your Social Worker's name and contact numbers are listed  below.  The following services may also be recommended but are not provided by the [facility] will be made to provide these services after discharge if needed.  Arrangements include referral to agencies that provide these services.  Your insurance has been verified to be:  [employer] Your primary doctor is:  Donnie Coffin  Pertinent information will be shared with your doctor and your insurance company.  Social Worker:   [place], [place] or [phone]   Information discussed with and copy given to patient by: Lennart Pall, 02/25/2016, 12:50 PM

## 2016-02-25 NOTE — Progress Notes (Signed)
Physical Therapy Session Note  Patient Details  Name: Brandy Hardy MRN: [id number] Date of Birth: [birth year]  Today's Date: 02/25/2016 PT Individual Time: 0900-1031 PT Individual Time Calculation (min): 91 min    Short Term Goals: Week 1:  PT Short Term Goal 1 (Week 1): Patient will transfer sit <> stand using RW with assist of 1 person. PT Short Term Goal 2 (Week 1): Patient will perform bed mobility with consistent min A.  PT Short Term Goal 3 (Week 1): Patient will ambulate 25 ft with min A using LRAD. PT Short Term Goal 4 (Week 1): Patient will initiate stair training.   Skilled Therapeutic Interventions/Progress Updates:    Pt received in bed & agreeable to tx, noting 6-7/10 pain with RN administering medications. Pt performed rolling L/R with mod assist and bed rails to allow RN & therapist to don mesh underwear; pt with difficulty rolling, especially to R, 2/2 body habitus and pain. Provided pt with 24x18 w/c and adjusted ELR's for increased comfort and improved positioning. Unable to utilize w/c cushion as pt is unable to sit and scoot back in seat due to added height. Discussed need to reposition in bed & recliner every 20 minutes throughout the day to prevent skin breakdown; educated pt on techniques. Pt performed UE strengthening exercises including bicep curls with 4 lbs bar. Gait training x 13 ft + 13 ft + 5 ft with RW & steady assist/supervision with cuing for R foot clearance and pt with good demonstration. Pt requires cuing for technique to square up to chair and for hand placement to increase safety with transfers. At end of session pt left sitting in recliner in room with BLE elevated all needs within reach.   Pt requires frequent rest breaks with activity 2/2 fatigue.   Therapy Documentation Precautions:  Precautions Precautions: Fall Precaution Comments: R groin abscess, port Restrictions Weight Bearing Restrictions: No     See Function Navigator for Current  Functional Status.   Therapy/Group: Individual Therapy  Waunita Schooner 02/25/2016, 11:31 AM

## 2016-02-25 NOTE — Progress Notes (Signed)
Pharmacy expressing concerns regarding concomitant use of statin and fluconazole. Will hold Lipitor for now.

## 2016-02-26 ENCOUNTER — Inpatient Hospital Stay (HOSPITAL_COMMUNITY): Payer: Managed Care, Other (non HMO) | Admitting: Physical Therapy

## 2016-02-26 ENCOUNTER — Inpatient Hospital Stay (HOSPITAL_COMMUNITY): Payer: Managed Care, Other (non HMO)

## 2016-02-26 MED ORDER — LIDOCAINE HCL 2 % EX GEL: CUTANEOUS | Status: DC | PRN

## 2016-02-26 MED ORDER — SALINE SPRAY 0.65 % NA SOLN
1.0000 | NASAL | Status: DC | PRN
  Administered 2016-02-27: 1 via NASAL
  Filled 2016-02-26: qty 44

## 2016-02-26 NOTE — Patient Care Conference (Signed)
Inpatient RehabilitationTeam Conference and Plan of Care Update Date: 02/26/2016   Time: 2:40 PM    Patient Name: Brandy Hardy      Medical Record Number: [id number]  Date of Birth: 25-Jun-1955 Sex: Female         Room/Bed: 4M07C/4M07C-01 Payor Info: Payor: Monia Pouch / Plan: Derrek Gu / Product Type: *No Product type* /    Admitting Diagnosis: debility  Admit Date/Time:  02/22/2016  6:07 PM Admission Comments: No comment available   Primary Diagnosis:  Debility Principal Problem: Debility  Patient Active Problem List   Diagnosis Date Noted  . Debility 02/22/2016  . Abscess of right groin 02/20/2016  . Fever   . History of cervical cancer   . Acute blood loss anemia   . Anemia of chronic disease   . Tachypnea   . Lymphocytosis   . Abscess of groin, right   . Super obese (HCC)   . Sepsis (HCC)   . Septic shock (HCC) 02/14/2016  . UTI (urinary tract infection)   . Obesity, morbid (HCC) 02/21/2013  . Essential hypertension 11/23/2012  . Type II or unspecified type diabetes mellitus without mention of complication, not stated as uncontrolled 11/23/2012  . Other and unspecified hyperlipidemia 11/23/2012    Expected Discharge Date: Expected Discharge Date: 03/05/16  Team Members Present: Physician leading conference: Dr. Faith Rogue Social Worker Present: Amada Jupiter, LCSW Nurse Present: Carmie End, RN PT Present: Aleda Grana, PT;Other (comment) Judieth Keens, PT) OT Present: Roney Mans, OT;Ardis Rowan, COTA SLP Present: Feliberto Gottron, SLP PPS Coordinator present : Tora Duck, RN, CRRN     Current Status/Progress Goal Weekly Team Focus  Medical   debility after sepsis/wound infection. daily dressings/ wound slowly healing. foley out today  improve functional mobility  wound care, bladder mgt, pain control   Bowel/Bladder   Chronic foley resulted in urinary retention, periurethal ulcers, and wound, LBM 02/25/16  Continue to assess patient with toileting  need  Monitor assess bowel function, BUN, CREA, and follow MDs orders in relation to the foley    Swallow/Nutrition/ Hydration             ADL's   bathing (EOB)-mod A; dressing (housecoat)-min A; functional transfers-min A  supervision overall; min A toileting  activity tolerance, functional transfers; LB dressing/bathing and toileting; safety awareness   Mobility   mod assist rolling in bed, supervision supine>sitting EOB, steady assist transfers & ambulation up to 13 ft with RW, limited by fatigue & pain  min assist for stair negotiation & car transfer, supervision for ambulation, mod I bed mobility & sitting balance  gait training, endurance training, activity tolerance, strengthening, pt education & safety awareness   Communication             Safety/Cognition/ Behavioral Observations            Pain   4/10 pain level experience much pain during dressing change  Assess pain level as needed, especially for  dressing  Administer prn pain regimen as needed.    Skin   Surgical wound, clean wound BID  Skin around surgical wound WNL and wound appears to  be healling approprate, continue dressing changes as order.  continue to administer ABT as scheduled and continue to monitor skin     Rehab Goals Patient on target to meet rehab goals: Yes *See Care Plan and progress notes for long and short-term goals.  Barriers to Discharge: size, wound care    Possible Resolutions to Barriers:  husband learning  wound care techniques, maximize functional mobility    Discharge Planning/Teaching Needs:  Plan home with spouse, daughter and sister who can cover very close to 24/7 supervision.  May need further education with nursing for wound care.   Team Discussion:  abx completed today;  Pt very anxious about foley removal.  Min assist goals for toilet hygiene.  supervision all other goals.  Limited endurance overall.  Revisions to Treatment Plan:  None   Continued Need for Acute Rehabilitation  Level of Care: The patient requires daily medical management by a physician with specialized training in physical medicine and rehabilitation for the following conditions: Daily direction of a multidisciplinary physical rehabilitation program to ensure safe treatment while eliciting the highest outcome that is of practical value to the patient.: Yes Daily medical management of patient stability for increased activity during participation in an intensive rehabilitation regime.: Yes Daily analysis of laboratory values and/or radiology reports with any subsequent need for medication adjustment of medical intervention for : Post surgical problems;Urological problems  Myrah Strawderman 02/26/2016, 3:21 PM

## 2016-02-26 NOTE — Progress Notes (Signed)
Pt voided at 1715. Pt's wound dressing saturated again. Dressing changed. Pt uncomfortable with multiple wound dressings. Pt feeling some urgency and voiding frequently but pvr's remain low. Continue plan of care

## 2016-02-26 NOTE — Progress Notes (Signed)
Changed patient dressing after usage of bed pan resulted in wound/dressing saturated with urine. Patient was apologize about the numerous of time she had to have a dressing change. Cleansed wound with normal saline and administered wet to dry dressing. Patient continues to experience pain with dressing changes.

## 2016-02-26 NOTE — Progress Notes (Signed)
Both dressing changes completed on 7p-7a shift, tolerated well. Appears to be healing appropriately non odorous.

## 2016-02-26 NOTE — Progress Notes (Signed)
Patient voided at 1715. Attempted to use female urinal to prevent dressing saturation. Dressing still saturated. Patient in a lot of pain with this dressing change. Oxy IR given for pain. Patient dressing changed. PVR 0. Continue plan of care

## 2016-02-26 NOTE — Progress Notes (Signed)
Occupational Therapy Session Note  Patient Details  Name: Brandy Hardy MRN: [id number] Date of Birth: [birth year]  Today's Date: 02/26/2016 OT Individual Time: 0700-0800 OT Individual Time Calculation (min): 60 min     Short Term Goals: Week 1:  OT Short Term Goal 1 (Week 1): Pt will complete toilet transfer with LRAD and 1 helper OT Short Term Goal 2 (Week 1): Pt will complete UB bathing with supervision while maintaining sitting balance  OT Short Term Goal 3 (Week 1): Pt will complete LB bathing with Min A and AE as needed   Skilled Therapeutic Interventions/Progress Updates:    Pt resting in bed upon arrival.  Pt completed supine>sit EOB at supervision level with bed rails and HOB elevated.  Pt completed bathing/dressing tasks (housecoat) seated EOB.  Pt requires assistance with bathing BLE and donning socks.  Pt performed sit<>stand at supervision level and side stepped X 5 to move towards [facility] before laying back in bed to await MD for wound care.  Pt able to reposition in bed with min A.  Pt requires more than a reasonable amount of time to complete all tasks.  Pt's husband present at beginning of session and provided pictures and dimensions of bedroom and bathroom.  Pt's husband stated that a ramp was being built in garage.  Pt's husband also stated that they had a w/c and RW. I explained that we would want to assess her current equipment to determine appropriateness.   Therapy Documentation Precautions:  Precautions Precautions: Fall Precaution Comments: R groin abscess, port Restrictions Weight Bearing Restrictions: No General:   Vital Signs:  Pain:   ADL: ADL ADL Comments: Please see functional navigator for ADL status Exercises:   Other Treatments:    See Function Navigator for Current Functional Status.   Therapy/Group: Individual Therapy  [name] 02/26/2016, 10:31 AM

## 2016-02-26 NOTE — Progress Notes (Signed)
Physical Therapy Session Note  Patient Details  Name: Brandy Hardy MRN: [id number] Date of Birth: Jun 02, 1955  Today's Date: 02/26/2016 PT Individual Time: 1305-1401 PT Individual Time Calculation (min): 56 min    Short Term Goals: Week 1:  PT Short Term Goal 1 (Week 1): Patient will transfer sit <> stand using RW with assist of 1 person. PT Short Term Goal 2 (Week 1): Patient will perform bed mobility with consistent min A.  PT Short Term Goal 3 (Week 1): Patient will ambulate 25 ft with min A using LRAD. PT Short Term Goal 4 (Week 1): Patient will initiate stair training.   Skilled Therapeutic Interventions/Progress Updates:    Pt received in recliner & agreeable to tx, denying c/o pain. During session pt propelled w/c room<>gym (150 ft + 150 ft) with BUE & Supervision for cardiovascular endurance training. Pt ambulated 22 ft + 22 ft with RW & supervision assist with improved RLE foot clearance but cuing for forward gaze. Pt notes fatigue quickly with all tasks & requires rest breaks frequently. Utilized BUE ergometer on level 1 x 5 minutes forwards for endurance training. At end of session pt left sitting in w/c in room with all needs within reach.   Pt unable to take prolonged rest break (~2 minutes only) on mat table without back support as pt reports fatigue & decreased balance.   Pt voiced concerns regarding catheter removal and increased risk of infection in wound; RN & PA informed.   Therapy Documentation Precautions:  Precautions Precautions: Fall Precaution Comments: R groin abscess, port Restrictions Weight Bearing Restrictions: No  See Function Navigator for Current Functional Status.   Therapy/Group: Individual Therapy  Waunita Schooner 02/26/2016, 2:15 PM

## 2016-02-26 NOTE — Progress Notes (Signed)
[place] PHYSICAL MEDICINE & REHABILITATION     PROGRESS NOTE    Subjective/Complaints: Good day with therapy yesterday. Pain under control. Using oxycodone prior to dressing change.  ROS: Pt denies fever, rash/itching, headache, blurred or double vision, nausea, vomiting, abdominal pain, diarrhea, chest pain, shortness of breath, palpitations, dysuria, dizziness, neck pain, back pain, bleeding, anxiety, or depression    Objective: Vital Signs: Blood pressure (!) 102/35, pulse 77, temperature 98.3 F (36.8 C), temperature source Oral, resp. rate 18, height 5\' 3"  (1.6 m), weight 135.6 kg (298 lb 15.1 oz), SpO2 95 %. No results found.  Recent Labs  02/25/16 0456  WBC 10.0  HGB 8.1*  HCT 25.5*  PLT 437*   No results for input(s): NA, K, CL, GLUCOSE, BUN, CREATININE, CALCIUM in the last 72 hours.  Invalid input(s): CO CBG (last 3)  No results for input(s): GLUCAP in the last 72 hours.  Wt Readings from Last 3 Encounters:  02/22/16 135.6 kg (298 lb 15.1 oz)  02/22/16 135 kg (297 lb 9.6 oz)  10/08/15 122.5 kg (270 lb)    Physical Exam:  Constitutional: NAD obese HENT:  Head: NCAT.  Mouth/Throat: Oropharynx is clear and moist.  Eyes: EOMI  Neck: supple  Cardiovascular: RRR Respiratory:cta b  GI: bs+.  Musculoskeletal: Normal range of motion. She exhibits peripheral edema.  Neurological: She is alertand oriented to person, place, and time. UE 5/5, LE: 2/5-4/5 prox to distal Skin: Skin is warmand dry.   Granulation and adipose tissue in groin/thigh wound.  Areas remain tender with palpation/manipulation Psychiatric: normal affect.   Assessment/Plan: 1. Functional and mobility deficits  secondary to sepsis/abscessed wounds which require 3+ hours per day of interdisciplinary therapy in a comprehensive inpatient rehab setting. Physiatrist is providing close team supervision and 24 hour management of active medical problems listed below. Physiatrist and rehab team  continue to assess barriers to discharge/monitor patient progress toward functional and medical goals.  Function:  Bathing Bathing position   Position: Sitting EOB  Bathing parts Body parts bathed by patient: Right arm, Left arm, Chest, Abdomen, Right upper leg, Left upper leg Body parts bathed by helper: Buttocks, Front perineal area, Right lower leg, Left lower leg, Back  Bathing assist Assist Level: Touching or steadying assistance(Pt > 75%)      Upper Body Dressing/Undressing Upper body dressing   What is the patient wearing?: Button up shirt, Pull over shirt/dress     Pull over shirt/dress - Perfomed by patient: Thread/unthread right sleeve, Thread/unthread left sleeve, Put head through opening, Pull shirt over trunk   Button up shirt - Perfomed by patient: Thread/unthread right sleeve, Thread/unthread left sleeve, Pull shirt around back, Button/unbutton shirt      Upper body assist Assist Level: Supervision or verbal cues   Set up : To obtain clothing/put away  Lower Body Dressing/Undressing Lower body dressing   What is the patient wearing?: Non-skid slipper socks           Non-skid slipper socks- Performed by helper: Don/doff right sock, Don/doff left sock                  Lower body assist Assist for lower body dressing:  (Max A)      Toileting Toileting Toileting activity did not occur: Safety/medical concerns   Toileting steps completed by helper: Adjust clothing after toileting, Performs perineal hygiene, Adjust clothing prior to toileting    Toileting assist Assist level: Two helpers   Transfers Chair/bed transfer  Chair/bed transfer method: Ambulatory, Stand pivot Chair/bed transfer assist level: Touching or steadying assistance (Pt > 75%) Chair/bed transfer assistive device: Walker, Air cabin crew Ambulation activity did not occur: Safety/medical concerns (per report)   Max distance: 13 ft Assist level: Touching or  steadying assistance (Pt > 75%)   Wheelchair Wheelchair activity did not occur: Safety/medical concerns (per report)        Cognition Comprehension Comprehension assist level: Follows complex conversation/direction with no assist  Expression Expression assist level: Expresses complex ideas: With no assist  Social Interaction Social Interaction assist level: Interacts appropriately with others - No medications needed.  Problem Solving Problem solving assist level: Solves complex problems: Recognizes & self-corrects  Memory Memory assist level: Complete Independence: No helper   Medical Problem List and Plan: 1. Functional and mobility deficitssecondary to inguinal/thigh abscess and sepsis with subsequent debility -continue CIR therapies  -team conference today 2. DVT Prophylaxis/Anticoagulation: Lovenox 3. Pain Management: Premedicating with oxycodone prior to dressing changes has been successful 4. Mood: Motivated to put best effort and get home. LCSW to follow for evaluation and support.  5. Neuropsych: This patient iscapable of making decisions on herown behalf. 6. Skin/Wound Care: Wet to dry dressing changes bid. Husband in process of learning wound care.--continue to educate other family members.   -dc folely and begin voiding trial  -if incontinent/wound issues then will decide upon replacing  -pt agrees and would like to try    7. Fluids/Electrolytes/Nutrition: good PO   8. Necrotizing fasciitis with right groin abscess s/p I and D: To continue wet to dry dressing changed to right mons/thigh wound. Daily showers recommended.   -IV Zosyn/Diflucan D # 21/21  9. Morbid obesity: Encourage appropriate diet and weight loss. Also discussed increasing activity level as patient sedentary. She reports that she has lost 50 lbs in the past 2 years and off BP/DM meds.    - dietician consulted.  10. Reactive leucocytosis: 10.7, stable to decreased.  11. Stress induced  hyperglycemia: Hgb A1c- 5.6 (10/2015). Added CM restrictions to diet.  12. Critical illness anemia: 11.5-->8.5 > 8.1---labs personally reviewed  -continue to follow serially  -recheck wednesday  -no obvious blood loss  13. Periurethral ulcers with chronic cystitis/frequency/retention: .   -dc foley  -voiding trial this week as above  LOS (Days) 4 A FACE TO FACE EVALUATION WAS PERFORMED  Brandy Hardy T 02/26/2016 8:29 AM

## 2016-02-26 NOTE — Progress Notes (Signed)
RN removed foley at 1400. Pt due to void. Will continue with q 2 hour toileting regimen

## 2016-02-26 NOTE — Progress Notes (Signed)
Physical Therapy Note  Patient Details  Name: Brandy Hardy MRN: [id number] Date of Birth: 29-Apr-1955 Today's Date: 02/26/2016    Time: [phone] 71 minutes  1:1 No c/o pain. Pt able to perform bed mobility with increased time and supervision with use of bed rail.  Gait short distances in room for practice of [place] transfer with supervision for gait, min A for BSC transfer x 2.  W/c mobility with bilat UEs 100' x 2 in controlled environment.  Sit to stand training and standing therex with min A for sit to stand, frequent seated rest breaks during standing activities.  UE therex with 5# bar 20x all exercises for all shoulder motions.  Pt improving transfers but still limited by decreased endurance.   DONAWERTH,KAREN 02/26/2016, 10:57 AM

## 2016-02-26 NOTE — Plan of Care (Signed)
Problem: RH PAIN MANAGEMENT Goal: RH STG PAIN MANAGED AT OR BELOW PT'S PAIN GOAL < 3 on pain scale  Outcome: Progressing Administering pain regimen before wound care.

## 2016-02-26 NOTE — Progress Notes (Signed)
Changed patient dressing after usage of bed pan resulted in wound/dressing saturated with urine. Patient was apologize about the numerous of time she had to have a dressing change.

## 2016-02-26 NOTE — Progress Notes (Signed)
Pt voided 100 cc at 1630. Dressing saturated. Dressing changed. PVR: 30. Continue POC.

## 2016-02-26 NOTE — Progress Notes (Signed)
RN spoke with patient about taking foley out today and patient strongly wishes to wait until this afternoon. RN eduated patient on process after foley was removed.

## 2016-02-26 NOTE — Progress Notes (Addendum)
Pt voided at 1525. Pt dressing saturated completely. Dressing changed. Continue plan of care. PVR: 0

## 2016-02-27 ENCOUNTER — Inpatient Hospital Stay (HOSPITAL_COMMUNITY): Payer: Managed Care, Other (non HMO)

## 2016-02-27 ENCOUNTER — Inpatient Hospital Stay (HOSPITAL_COMMUNITY): Payer: Managed Care, Other (non HMO) | Admitting: Physical Therapy

## 2016-02-27 DIAGNOSIS — R350 Frequency of micturition: Secondary | ICD-10-CM

## 2016-02-27 LAB — URINALYSIS, ROUTINE W REFLEX MICROSCOPIC
Bilirubin Urine: NEGATIVE
Glucose, UA: NEGATIVE mg/dL
Ketones, ur: NEGATIVE mg/dL
NITRITE: NEGATIVE
PROTEIN: 30 mg/dL — AB
Specific Gravity, Urine: 1.012 (ref 1.005–1.030)
pH: 6.5 (ref 5.0–8.0)

## 2016-02-27 LAB — CBC
HCT: 26.2 % — ABNORMAL LOW (ref 36.0–46.0)
Hemoglobin: 8.2 g/dL — ABNORMAL LOW (ref 12.0–15.0)
MCH: 30 pg (ref 26.0–34.0)
MCHC: 31.3 g/dL (ref 30.0–36.0)
MCV: 96 fL (ref 78.0–100.0)
PLATELETS: 472 10*3/uL — AB (ref 150–400)
RBC: 2.73 MIL/uL — AB (ref 3.87–5.11)
RDW: 17.5 % — ABNORMAL HIGH (ref 11.5–15.5)
WBC: 7.9 10*3/uL (ref 4.0–10.5)

## 2016-02-27 LAB — URINE MICROSCOPIC-ADD ON

## 2016-02-27 MED ORDER — ENSURE ENLIVE PO LIQD
237.0000 mL | Freq: Two times a day (BID) | ORAL | Status: DC
  Administered 2016-02-28 – 2016-03-05 (×6): 237 mL via ORAL

## 2016-02-27 MED ORDER — OXYCODONE HCL 5 MG PO TABS
5.0000 mg | ORAL_TABLET | ORAL | Status: DC | PRN
  Administered 2016-02-27 – 2016-03-04 (×14): 5 mg via ORAL
  Filled 2016-02-27 (×14): qty 1

## 2016-02-27 MED ORDER — OXYBUTYNIN CHLORIDE 5 MG PO TABS
5.0000 mg | ORAL_TABLET | Freq: Two times a day (BID) | ORAL | Status: DC
  Administered 2016-02-27 – 2016-03-02 (×9): 5 mg via ORAL
  Filled 2016-02-27 (×9): qty 1

## 2016-02-27 NOTE — Plan of Care (Signed)
Problem: RH Stairs Goal: LTG Patient will ambulate up and down stairs w/assist (PT) LTG: Patient will ambulate up and down # of stairs with assistance (PT)  4 steps (3") with B rails & PT only for strengthening purposes; downgrade 2/2 pt having ramp installed at home

## 2016-02-27 NOTE — Progress Notes (Signed)
Pt urinated in bsc at 1700. Pt voided large amount of yellow urine. Pt dressing saturated and new wet to dry dressing was placed. PVR is 0.

## 2016-02-27 NOTE — Plan of Care (Signed)
Problem: RH Ambulation Goal: LTG Patient will ambulate in controlled environment (PT) LTG: Patient will ambulate in a controlled environment, # of feet with assistance (PT).  30 ft with LRAD; downgrade due to home set up Goal: LTG Patient will ambulate in home environment (PT) LTG: Patient will ambulate in home environment, # of feet with assistance (PT).  30 ft with LRAD; downgrade due to home set up

## 2016-02-27 NOTE — Progress Notes (Signed)
[place] PHYSICAL MEDICINE & REHABILITATION     PROGRESS NOTE    Subjective/Complaints: Up a lot emptying bladder. NT reports low pvr's. Pt tired but happy her bladder is emptying.   ROS: Pt denies fever, rash/itching, headache, blurred or double vision, nausea, vomiting, abdominal pain, diarrhea, chest pain, shortness of breath, palpitations, dizziness, neck pain, back pain, bleeding, anxiety, or depression    Objective: Vital Signs: Blood pressure (!) 123/53, pulse 81, temperature 98.6 F (37 C), temperature source Oral, resp. rate 18, height 5\' 3"  (1.6 m), weight 135.6 kg (298 lb 15.1 oz), SpO2 93 %. No results found.  Recent Labs  02/25/16 0456 02/27/16 0535  WBC 10.0 7.9  HGB 8.1* 8.2*  HCT 25.5* 26.2*  PLT 437* 472*   No results for input(s): NA, K, CL, GLUCOSE, BUN, CREATININE, CALCIUM in the last 72 hours.  Invalid input(s): CO CBG (last 3)  No results for input(s): GLUCAP in the last 72 hours.  Wt Readings from Last 3 Encounters:  02/22/16 135.6 kg (298 lb 15.1 oz)  02/22/16 135 kg (297 lb 9.6 oz)  10/08/15 122.5 kg (270 lb)    Physical Exam:  Constitutional: NAD, sitting on BSC obese HENT:  Head: AT  Mouth/Throat: Oropharynx is clear and moist.  Eyes: PERRL  Neck: supple  Cardiovascular: RRR Respiratory:cleart to ausc  GI: ND  Musculoskeletal: Normal range of motion. She exhibits peripheral edema.  Neurological: She is alertand oriented to person, place, and time. UE 5/5, LE: 3/5-4+/5 prox to distal Skin: Skin is warmand dry.   Granulation and adipose tissue in groin/thigh wound is stable/clean.  Areas remain tender with palpation/manipulation Psychiatric: normal affect.   Assessment/Plan: 1. Functional and mobility deficits  secondary to sepsis/abscessed wounds which require 3+ hours per day of interdisciplinary therapy in a comprehensive inpatient rehab setting. Physiatrist is providing close team supervision and 24 hour management of active  medical problems listed below. Physiatrist and rehab team continue to assess barriers to discharge/monitor patient progress toward functional and medical goals.  Function:  Bathing Bathing position   Position: Sitting EOB  Bathing parts Body parts bathed by patient: Right arm, Left arm, Chest, Abdomen, Right upper leg, Left upper leg Body parts bathed by helper: Buttocks, Front perineal area, Right lower leg, Left lower leg, Back  Bathing assist Assist Level: Touching or steadying assistance(Pt > 75%)      Upper Body Dressing/Undressing Upper body dressing   What is the patient wearing?: Button up shirt, Pull over shirt/dress     Pull over shirt/dress - Perfomed by patient: Thread/unthread right sleeve, Thread/unthread left sleeve, Put head through opening, Pull shirt over trunk   Button up shirt - Perfomed by patient: Thread/unthread right sleeve, Thread/unthread left sleeve, Pull shirt around back, Button/unbutton shirt      Upper body assist Assist Level: Supervision or verbal cues   Set up : To obtain clothing/put away  Lower Body Dressing/Undressing Lower body dressing   What is the patient wearing?: Non-skid slipper socks           Non-skid slipper socks- Performed by helper: Don/doff right sock, Don/doff left sock                  Lower body assist Assist for lower body dressing:  (Max A)      Toileting Toileting Toileting activity did not occur: Safety/medical concerns   Toileting steps completed by helper: Adjust clothing after toileting, Performs perineal hygiene, Adjust clothing prior to toileting  Toileting assist Assist level: Two helpers   Transfers Chair/bed transfer   Chair/bed transfer method: Ambulatory Chair/bed transfer assist level: Supervision or verbal cues Chair/bed transfer assistive device: Medical sales representative Ambulation activity did not occur: Safety/medical concerns (per report)   Max distance: 22 ft Assist  level: Supervision or verbal cues   Wheelchair Wheelchair activity did not occur: Safety/medical concerns (per report) Type: Manual Max wheelchair distance: 150 ft Assist Level: Supervision or verbal cues  Cognition Comprehension Comprehension assist level: Follows complex conversation/direction with no assist  Expression Expression assist level: Expresses complex ideas: With no assist  Social Interaction Social Interaction assist level: Interacts appropriately with others - No medications needed.  Problem Solving Problem solving assist level: Solves complex problems: Recognizes & self-corrects  Memory Memory assist level: Complete Independence: No helper   Medical Problem List and Plan: 1. Functional and mobility deficitssecondary to inguinal/thigh abscess and sepsis with subsequent debility -continue CIR therapies  -pt remains motivated 2. DVT Prophylaxis/Anticoagulation: Lovenox 3. Pain Management: Premedicating with oxycodone prior to dressing changes has been successful 4. Mood: Motivated to put best effort and get home. LCSW to follow for evaluation and support.  5. Neuropsych: This patient iscapable of making decisions on herown behalf. 6. Skin/Wound Care: Continue Wet to dry dressing changes bid. Husband learning wound care.--continue to educate other family members.  7. Fluids/Electrolytes/Nutrition: good PO intake   8. Necrotizing fasciitis with right groin abscess s/p I and D: To continue wet to dry dressing changed to right mons/thigh wound. Daily showers recommended.   -IV Zosyn/Diflucan D # 21/21 --abx complete--dc 9. Morbid obesity: Encourage appropriate diet and weight loss. Also discussed increasing activity level as patient sedentary. She reports that she has lost 50 lbs in the past 2 years and off BP/DM meds.    - dietician consulted.  10. Reactive leucocytosis: down to 7.9 today. 11. Stress induced hyperglycemia: Hgb A1c- 5.6 (10/2015). continue  CM restrictions to diet.  12. Critical illness anemia: 11.5-->8.5 > 8.1---back to 8.2  -recheck Friday  -labs personally reviewed  -no obvious blood loss  13. Periurethral ulcers with chronic cystitis/frequency/retention: .   -pt wants to continue voiding trial  -add ditropan to help with frequency  -dc iv abx/associated ivf  -check UA/CX  -if remains persistent can consider replacement of foley catheter  LOS (Days) 5 A FACE TO FACE EVALUATION WAS PERFORMED  Jaishawn Witzke T 02/27/2016 8:18 AM

## 2016-02-27 NOTE — Evaluation (Signed)
Recreational Therapy Assessment and Plan  Patient Details  Name: Brandy Hardy MRN: [id number] Date of Birth: [birth year] Today's Date: 02/27/2016  Rehab Potential: Good  Assessment Clinical Impression:  Problem List: Patient Active Problem List   Diagnosis Date Noted  . Debility 02/22/2016  . Abscess of right groin 02/20/2016  . Fever   . History of cervical cancer   . Acute blood loss anemia   . Anemia of chronic disease   . Tachypnea   . Lymphocytosis   . Abscess of groin, right   . Super obese (HCC)   . Sepsis (HCC)   . Septic shock (HCC) 02/14/2016  . UTI (urinary tract infection)   . Obesity, morbid (HCC) 02/21/2013  . Essential hypertension 11/23/2012  . Type II or unspecified type diabetes mellitus without mention of complication, not stated as uncontrolled 11/23/2012  . Other and unspecified hyperlipidemia 11/23/2012    Past Medical History:      Past Medical History:  Diagnosis Date  . Cervical adenocarcinoma (HCC)    stage II  . Endometrial cancer (HCC)    with recurrance at introitus   . History of prediabetes   . 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 ABDOMINAL HYSTERECTOMY    . I&D EXTREMITY Right 02/20/2016   Procedure: IRRIGATION AND DEBRIDEMENT EXTREMITY RIGHT UPPER THIGH;  Surgeon: Claud Kelp, MD;  Location: [facility] OR;  Service: General;  Laterality: Right;  . IRRIGATION AND DEBRIDEMENT ABSCESS Right 02/16/2016   Procedure: IRRIGATION AND DEBRIDEMENT RIGHT GROIN ABSCESS, FULL THICKNESS DEBRIDMENT RIGHT GROIN INCLUDING > 40CM SQUARE.;  Surgeon: De Blanch Kinsinger, MD;  Location: [facility] OR;  Service: General;  Laterality: Right;  . IRRIGATION AND DEBRIDEMENT ABSCESS Right 02/20/2016   Procedure: IRRIGATION AND DEBRIDEMENT ABSCESS;  Surgeon: Claud Kelp, MD;  Location: [facility] OR;  Service: General;  Laterality: Right;   Perii-Vaginal Abscess    Assessment & Plan Clinical Impression: Brandy Hardy is a 60 year old female with history of recurrent endometrial cancer s/p hysterectomy and XRT, morbid obesity, recent UTI who was admitted on 02/14/16 with weakness, poor po intake, abdominal pain and near syncope. She was noted to be dehydrated and hypotensive due to septic shock. She was treated with fluid bolus, IV antibiotics and levophed. CT abdomen showed areas of subcutaneous cellulitis with early abscess formation and air with inflammatory changes tracing into bilateral upper thigh musculature. She developed right groin drainage due to abscess. 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 Sheliah Hatch on 11/11. She continued to have copious malodorous drainage from wound and was taken back to OR on 11/15 for repeat I and D and was found to have urinary retention during placement of foley catheter.   Blood culture X 2 positive for beta lactamase positive Bacteriodes fragilis. Wound culture with mixed flora. To continue bid dressing as will not be able to place VAC and continue IV antibiotics per CCS. Dr. Drue Second recommends weeks of IV antibiotic regiment with 11/12 as day one--antibiotic narrowed to Zosyn and Flucanozole. Therapy evaluations done showing patient limited by generalized weakness and difficulty mobilizing due to pain and body habitus. Patient transferred to CIR on 02/22/2016.  Met with pt to discuss TR services with focus on energy conservation, activity analysis with potential adaptations, community  pursuits & stress management/relaxation training.  Pt appreciative of information and plans to use suggested relaxation training techniques at discharge.    Plan No further TR as pt is expected to discharge home next week  Recommendations for other services: None  The above assessment, treatment plan,  treatment alternatives and goals were discussed and mutually agreed upon: by patient  Teriann Livingood 02/27/2016, 10:45 AM

## 2016-02-27 NOTE — Progress Notes (Signed)
Patient ID: Brandy Hardy, female   DOB: 1955-04-24, 60 y.o.   MRN: [id number]  [facility] Surgery Progress Note     Subjective: Feeling well. No new complaints. Hopes to go home next week.  Objective: Vital signs in last 24 hours: Temp:  [98.5 F (36.9 C)-98.6 F (37 C)] 98.6 F (37 C) (11/22 0516) Pulse Rate:  [81-102] 81 (11/22 0516) Resp:  [18-20] 18 (11/22 0516) BP: (123-125)/(53-58) 123/53 (11/22 0516) SpO2:  [93 %-94 %] 93 % (11/22 0516) Last BM Date: 02/26/16  Intake/Output from previous day: 11/21 0701 - 11/22 0700 In: 480 [P.O.:480] Out: 1050 [Urine:1050] Intake/Output this shift: Total I/O In: 240 [P.O.:240] Out: -   PE: Gen:  Alert, NAD, pleasant Pulm:  Effort normal Abd: Soft, NT/ND Wounds: healing well, no purulent drainage of foul odor       Lab Results:   Recent Labs  02/25/16 0456 02/27/16 0535  WBC 10.0 7.9  HGB 8.1* 8.2*  HCT 25.5* 26.2*  PLT 437* 472*   BMET No results for input(s): NA, K, CL, CO2, GLUCOSE, BUN, CREATININE, CALCIUM in the last 72 hours. PT/INR No results for input(s): LABPROT, INR in the last 72 hours. CMP     Component Value Date/Time   NA 137 02/23/2016 0700   K 3.7 02/23/2016 0700   CL 100 (L) 02/23/2016 0700   CO2 28 02/23/2016 0700   GLUCOSE 98 02/23/2016 0700   BUN <5 (L) 02/23/2016 0700   CREATININE 0.72 02/23/2016 0700   CALCIUM 8.2 (L) 02/23/2016 0700   PROT 4.7 (L) 02/23/2016 0700   ALBUMIN 1.5 (L) 02/23/2016 0700   AST 26 02/23/2016 0700   ALT 24 02/23/2016 0700   ALKPHOS 76 02/23/2016 0700   BILITOT 0.4 02/23/2016 0700   GFRNONAA >60 02/23/2016 0700   GFRAA >60 02/23/2016 0700   Lipase  No results found for: LIPASE     Studies/Results: No results found.  Anti-infectives: Anti-infectives    Start     Dose/Rate Route Frequency Ordered Stop   02/23/16 0800  fluconazole (DIFLUCAN) tablet 200 mg     200 mg Oral Daily 02/22/16 1833     02/22/16 2200  piperacillin-tazobactam  (ZOSYN) IVPB 3.375 g  Status:  Discontinued     3.375 g 12.5 mL/hr over 240 Minutes Intravenous Every 8 hours 02/22/16 1833 02/27/16 0824       Assessment/Plan Right groin necrotizing fasciitis, right thigh abscess 1. S/p incision and drainage of right thigh abscess, full thickness debridement of 40cm^2 right groin for necrotizing fasciitis11/11 Dr. Kieth Brightly 2. Debridement of skin subcutaneous tissue and fascia right groin wound; Debridement to be monitored skin subcutaneous tissue of right thigh wound11/15 Dr. Dalbert Batman - 11/11 culture: MIXED ANAEROBIC FLORA PRESENT - WBC 7.9  Urinary retention and periurethral ulcers  - catheter removed yesterday  History cervical cancer, TAH/BSO, more than 1 course of radiation therapy.  ID - zosyn 11/9>>11/22, diflucan 11/9>> FEN - heart healthy VTE - Lovenox  Plan: wounds healing well. Completed course of zosyn. Continue BID wet to dry dressing changes (or as needed if contaminated with urine) and daily showers. Follow WBC   LOS: 5 days    Jerrye Beavers , [facility] Surgery 02/27/2016, 10:04 AM Pager: [phone] Consults: [phone] Mon-Fri 7:00 am-4:30 pm Sat-Sun 7:00 am-11:30 am

## 2016-02-27 NOTE — Progress Notes (Addendum)
Cleaned wound and administered wet to dry dressing change. Patient feels about the numerous time dressing as to be changed. Patient at one point was trying not to sleep and watch the clock so she would void in the bed. Encourage patient to go to sleep and the urgency may resolved over night and to note sometimes after d/c of foley results in urgency she felt better. Patient was able to sleep. Patient wound looks healthy and continue heal appropriately. Applied to tegaderm to dressing to try to keep urine out wound. Superficial area right buttock, kept dry and administered barrier cream

## 2016-02-27 NOTE — Progress Notes (Signed)
Occupational Therapy Weekly Progress Note  Patient Details  Name: Brandy Hardy MRN: [id number] Date of Birth: [birth year]  Beginning of progress report period: February 22, 2016 End of progress report period: February 27, 2016  Patient has met 2 of 3 short term goals. Pt made steady progress with BADLs since admission.  Pt continues to require assistance with toileting tasks. Pt currently completes bathing tasks seated in chair or EOB.  Pt currently wearing hospital gowns or housecoats secondary to frequent dressing changes required for wound in groin.  Pt completes all transfers at supervision level.   Patient continues to demonstrate the following deficits: decreased I in self care, balance, functional transfers, endurance, strength, and safety awareness and therefore will continue to benefit from skilled OT intervention to enhance overall performance with BADL and iADL.  Patient progressing toward long term goals..  Continue plan of care.  OT Short Term Goals Week 1:  OT Short Term Goal 1 (Week 1): Pt will complete toilet transfer with LRAD and 1 helper OT Short Term Goal 1 - Progress (Week 1): Met OT Short Term Goal 2 (Week 1): Pt will complete UB bathing with supervision while maintaining sitting balance  OT Short Term Goal 2 - Progress (Week 1): Met OT Short Term Goal 3 (Week 1): Pt will complete LB bathing with Min A and AE as needed  OT Short Term Goal 3 - Progress (Week 1): Progressing toward goal Week 2:  OT Short Term Goal 1 (Week 2): STG=LTG secondary to ELOS     Therapy Documentation Precautions:  Precautions Precautions: Fall Precaution Comments: R groin abscess, port Restrictions Weight Bearing Restrictions: No  See Function Navigator for Current Functional Status.    Leotis Shames [facility] 02/27/2016, 11:26 AM

## 2016-02-27 NOTE — Progress Notes (Signed)
Physical Therapy Session Note  Patient Details  Name: Brandy Hardy MRN: [id number] Date of Birth: Feb 20, 1956  Today's Date: 02/27/2016 PT Individual Time: [id number] and 1350-1431 PT Individual Time Calculation (min): 55 min and 41 min   Short Term Goals: Week 1:  PT Short Term Goal 1 (Week 1): Patient will transfer sit <> stand using RW with assist of 1 person. PT Short Term Goal 2 (Week 1): Patient will perform bed mobility with consistent min A.  PT Short Term Goal 3 (Week 1): Patient will ambulate 25 ft with min A using LRAD. PT Short Term Goal 4 (Week 1): Patient will initiate stair training.   Skilled Therapeutic Interventions/Progress Updates:    Treatment 1: Pt received in bed & agreeable to tx, noting 6/10 pain in R groin following RN dressing wound; pt denied pain medication. Session focused on bed mobility, ambulation over ramp, and activity tolerance. Pt performed bed mobility in rehab apartment with mod assist to transfer sit>supine as pt with great difficulty transferring LE's onto bed 2/2 BLE weakness & overall fatigue. Pt requires min assist to transfer supine>sitting EOB with max cuing for all mobility tasks and significantly extra time. Pt negotiated ramp in ortho gym with RW & supervision assist, educating pt on negotiating RW over ramp threshold. Pt requires cuing for forward gaze with poor demo by pt. Pt propelled w/c throughout unit with BUE & Supervision overall for endurance training. Provided pt with instructions on how to build a ramp as pt's husband plans to install one before her return home. Requested pt have husband measure height of car seat & couch. Educated pt not to utilize rolling chair in her office due to increased fall risk; instructed her to f/u with HHPT on home office modifications since she does not plan to return to work until January. Pt's personal RW in room, but this one is not short enough nor wide enough therefore pt will need a bariatric RW upon d/c.  At end of session pt left sitting in recliner with all needs within reach.   Pt missed 5 minutes of skilled PT treatment 2/2 nursing care.   Treatment 2: Pt received in bed & agreeable to tx, denying c/o pain. Session focused on increasing activity tolerance with pt propelling w/c throughout unit (150 ft + 100 ft) with BUE and supervision overall. In gym pt ambulated 17 ft + 17 ft with RW & supervision, with improved foot clearance RLE. Pt noted significant fatigue after task and required rest break. At end of session pt reported need to use restroom & transferred w/c>BSC with RW & Supervision. Pt left on [facility] with all needs within reach & instructions to call for nursing assistance when wanting to transfer off of toilet; NT made aware of pt's location. Pt reported she will only have to ambulate 25 ft at most to negotiate areas in house, as pt has a w/c she will use to access door & for long distances; LTG's updated.  Therapy Documentation Precautions:  Precautions Precautions: Fall Precaution Comments: R groin abscess, port Restrictions Weight Bearing Restrictions: No   General:     See Function Navigator for Current Functional Status.   Therapy/Group: Individual Therapy  Waunita Schooner 02/27/2016, 12:16 PM

## 2016-02-27 NOTE — Progress Notes (Signed)
Occupational Therapy Session Note  Patient Details  Name: Krislynn Esquivel MRN: [id number] Date of Birth: 11-17-1955  Today's Date: 02/27/2016 OT Individual Time: 0700-0800 OT Individual Time Calculation (min): 60 min     Short Term Goals: Week 1:  OT Short Term Goal 1 (Week 1): Pt will complete toilet transfer with LRAD and 1 helper OT Short Term Goal 2 (Week 1): Pt will complete UB bathing with supervision while maintaining sitting balance  OT Short Term Goal 3 (Week 1): Pt will complete LB bathing with Min A and AE as needed   Skilled Therapeutic Interventions/Progress Updates:    Pt resting in bed upon arrival with husband present.  Continued discharge planning.  Pt eating breakfast and requested to complete breakfast.  Pt engaged in bed mobility, sit<>stand, standing balance, BSC transfers, and functional amb with RW to increase independence with BADLs. Pt anxious about urinary urgency and discussed timed toileting and use of BSC.  Pt agreeable and RN notified.  Pt consistently soils her groin dressing when voiding requiring return to bed for dressing change.  Therapy Documentation Precautions:  Precautions Precautions: Fall Precaution Comments: R groin abscess, port Restrictions Weight Bearing Restrictions: No Pain:  Pt denied pain  See Function Navigator for Current Functional Status.   Therapy/Group: Individual Therapy  [name] 02/27/2016, 8:23 AM

## 2016-02-27 NOTE — Progress Notes (Signed)
Patient toileted per 1.5 hours toileting regimen. Pt voided in bsc. Dressing completely saturated so dressing was changed. Izora Gala paged per her request to see the wound. Will continue to monitor.

## 2016-02-27 NOTE — Progress Notes (Signed)
Nutrition Follow-up  DOCUMENTATION CODES:   Morbid obesity  INTERVENTION:  Provide Ensure Enlive po BID, each supplement provides 350 kcal and 20 grams of protein.  Provide 30 ml Prostat po BID, each supplement provides 100 kcal and 15 grams of protein.   Encourage adequate PO intake.   NUTRITION DIAGNOSIS:   Inadequate oral intake related to  (food options and varied appetite) as evidenced by meal completion < 50%; improved  GOAL:   Patient will meet greater than or equal to 90% of their needs; met  MONITOR:   PO intake, Supplement acceptance, I & O's, Weight trends, Skin  REASON FOR ASSESSMENT:   Consult Diet education  ASSESSMENT:   60 y.o. female who presented to ED for weakness and s/p fall without LOC or head trauma. In the ED, patient was found to be febrile, tachycardic, and hypotensive. Found to have right groin necrotizing fasciitis, right thigh abscess. S/P I&D on 11/11.  Meal completion has been 50-100% with 100% at lunch today. Intake has been improving. Pt currently has Ensure and Prostat ordered and has been consuming most of them. RD to modify Ensure orders as intake has been improving.   Diet Order:  Diet heart healthy/carb modified Room service appropriate? Yes; Fluid consistency: Thin  Skin:  Wound (see comment) (Necrotizing fascitis R thigh abscess)  Last BM:  11/22  Height:   Ht Readings from Last 1 Encounters:  02/22/16 '5\' 3"'  (1.6 m)    Weight:   Wt Readings from Last 1 Encounters:  02/22/16 298 lb 15.1 oz (135.6 kg)    Ideal Body Weight:  52.3 kg  BMI:  Body mass index is 52.96 kg/m.  Estimated Nutritional Needs:   Kcal:  2000-2200  Protein:  115-135 grams  Fluid:  > 2 L/day  EDUCATION NEEDS:   Education needs addressed  Corrin Parker, MS, RD, LDN Pager # [phone] After hours/ weekend pager # [phone]

## 2016-02-27 NOTE — Progress Notes (Signed)
Social Work Patient ID: Brandy Hardy, female   DOB: February 13, 1956, 60 y.o.   MRN: [id number]   Have met with pt, spouse and sister to review team conference.  All aware and are agreeable with targeted d/c date of 11/29.  Pt appears a little apprehensive about the date and whether she will be ready.  Also, pt anxious about removal of cath and being able to manage multiple dressing changes.  Spouse and sister very supportive.  Reviewed HH and DME needs. Continue to follow for support and d/c planning needs.  Shakea Isip, LCSW

## 2016-02-27 NOTE — Progress Notes (Signed)
Pt's dressing changed when saturated at 1425. Pt expressed concern at this time about having family members at home that would be able to change dressing this frequently. PVR 0

## 2016-02-27 NOTE — Progress Notes (Signed)
Pt toileted to bsc. Dressing to thigh saturated and changed again. PVR: 0 Continue POC.

## 2016-02-27 NOTE — Progress Notes (Signed)
Pt toileting to bsc. Dressing saturated and new wet-to-dry dressing done. PVR was 0.

## 2016-02-27 NOTE — Plan of Care (Signed)
Problem: RH SKIN INTEGRITY Goal: RH STG SKIN FREE OF INFECTION/BREAKDOWN Max assist.  Outcome: Progressing Maintain dressing change BID and as needed

## 2016-02-27 NOTE — Progress Notes (Signed)
Occupational Therapy Session Note  Patient Details  Name: Brandy Hardy MRN: [id number] Date of Birth: Oct 22, 1955  Today's Date: 02/27/2016 OT Individual Time: 1030-1100 OT Individual Time Calculation (min): 30 min     Short Term Goals: Week 1:  OT Short Term Goal 1 (Week 1): Pt will complete toilet transfer with LRAD and 1 helper OT Short Term Goal 2 (Week 1): Pt will complete UB bathing with supervision while maintaining sitting balance  OT Short Term Goal 3 (Week 1): Pt will complete LB bathing with Min A and AE as needed   Skilled Therapeutic Interventions/Progress Updates:    Pt resting in bed upon arrival and agreeable to transferring to recliner to complete bathing/dressing tasks.  Pt performed all bed mobility and transfers at supervision level.  Pt elected to don hospital gown secondary to urinary urgency and frequent dressing changes.  Pt requested use of BSC and transferred to [facility] and back to bed at supervision level.  Pt required max A for sit>supine in ebd in preparation for dressing change.  Focus on activity tolerance, functional transfers, standing balance, functional amb with RW, BADL retraining, and safety awareness to increase independence with BADLs.  Therapy Documentation Precautions:  Precautions Precautions: Fall Precaution Comments: R groin abscess, port Restrictions Weight Bearing Restrictions: No General:   Vital Signs:  Pain:   ADL: ADL ADL Comments: Please see functional navigator for ADL status Exercises:   Other Treatments:    See Function Navigator for Current Functional Status.   Therapy/Group: Individual Therapy  [name] 02/27/2016, 11:25 AM

## 2016-02-28 ENCOUNTER — Inpatient Hospital Stay (HOSPITAL_COMMUNITY): Payer: Managed Care, Other (non HMO)

## 2016-02-28 DIAGNOSIS — N390 Urinary tract infection, site not specified: Secondary | ICD-10-CM

## 2016-02-28 LAB — URINE CULTURE: Culture: NO GROWTH

## 2016-02-28 MED ORDER — NITROFURANTOIN MONOHYD MACRO 100 MG PO CAPS
100.0000 mg | ORAL_CAPSULE | Freq: Two times a day (BID) | ORAL | Status: DC
  Administered 2016-02-28 (×2): 100 mg via ORAL
  Filled 2016-02-28 (×3): qty 1

## 2016-02-28 NOTE — Plan of Care (Signed)
Problem: RH PAIN MANAGEMENT Goal: RH STG PAIN MANAGED AT OR BELOW PT'S PAIN GOAL < 3 on pain scale  Outcome: Progressing No c/o pain

## 2016-02-28 NOTE — Plan of Care (Signed)
Problem: RH BLADDER ELIMINATION Goal: RH STG MANAGE BLADDER WITH EQUIPMENT WITH ASSISTANCE STG Manage Bladder With Equipment With total Assistance  Outcome: Completed/Met Date Met: 02/28/16 Foley dc'd.

## 2016-02-28 NOTE — Progress Notes (Addendum)
[place] [facility] PHYSICAL MEDICINE & REHABILITATION     PROGRESS NOTE    Subjective/Complaints: Pt laying in bed this AM.  Husband at bedside.  She slept well overnight.  She is looking forward to getting rest today.    ROS: Denies CP, SOB, N/V/D.  Objective: Vital Signs: Blood pressure (!) 109/45, pulse 69, temperature 98.7 F (37.1 C), temperature source Oral, resp. rate 20, height 5\' 3"  (1.6 m), weight 135.6 kg (298 lb 15.1 oz), SpO2 97 %. No results found.  Recent Labs  02/27/16 0535  WBC 7.9  HGB 8.2*  HCT 26.2*  PLT 472*   No results for input(s): NA, K, CL, GLUCOSE, BUN, CREATININE, CALCIUM in the last 72 hours.  Invalid input(s): CO CBG (last 3)  No results for input(s): GLUCAP in the last 72 hours.  Wt Readings from Last 3 Encounters:  02/22/16 135.6 kg (298 lb 15.1 oz)  02/22/16 135 kg (297 lb 9.6 oz)  10/08/15 122.5 kg (270 lb)    Physical Exam:  Constitutional: NAD. Vital signs reviewed.obese HENT: AT, [place] Eyes: EOMI. No discharge.  Cardiovascular: RRR. No JVD. Respiratory:cleart to ausc. Unlabored.  GI: Soft. NT  Musculoskeletal: Normal range of motion. She exhibits peripheral edema.  Neurological: She is alertand oriented.  Motor: UE 5/5 B/l LE: 4/5 prox to distal Skin: Skin is warmand dry.  Granulation and adipose tissue in groin/thigh with dressing is stable/clean.   Psychiatric: normal affect. Normal behavior  Assessment/Plan: 1. Functional and mobility deficits  secondary to sepsis/abscessed wounds which require 3+ hours per day of interdisciplinary therapy in a comprehensive inpatient rehab setting. Physiatrist is providing close team supervision and 24 hour management of active medical problems listed below. Physiatrist and rehab team continue to assess barriers to discharge/monitor patient progress toward functional and medical goals.  Function:  Bathing Bathing position   Position: Sitting EOB  Bathing parts Body parts bathed by patient:  Right arm, Left arm, Chest, Abdomen, Right upper leg, Left upper leg Body parts bathed by helper: Buttocks, Front perineal area, Right lower leg, Left lower leg, Back  Bathing assist Assist Level: Touching or steadying assistance(Pt > 75%)      Upper Body Dressing/Undressing Upper body dressing   What is the patient wearing?: Hospital gown     Pull over shirt/dress - Perfomed by patient: Thread/unthread right sleeve, Thread/unthread left sleeve, Put head through opening, Pull shirt over trunk   Button up shirt - Perfomed by patient: Thread/unthread right sleeve, Thread/unthread left sleeve, Pull shirt around back, Button/unbutton shirt      Upper body assist Assist Level: Supervision or verbal cues   Set up : To obtain clothing/put away  Lower Body Dressing/Undressing Lower body dressing   What is the patient wearing?: Hospital Gown, Non-skid slipper socks           Non-skid slipper socks- Performed by helper: Don/doff right sock, Don/doff left sock                  Lower body assist Assist for lower body dressing:  (Max A)      Toileting Toileting Toileting activity did not occur: Safety/medical concerns Toileting steps completed by patient: Adjust clothing after toileting, Adjust clothing prior to toileting Toileting steps completed by helper: Performs perineal hygiene    Toileting assist Assist level: Two helpers   Transfers Chair/bed transfer   Chair/bed transfer method: Stand pivot Chair/bed transfer assist level: Supervision or verbal cues Chair/bed transfer assistive device: Environmental consultant  Locomotion Ambulation Ambulation activity did not occur: Safety/medical concerns (per report)   Max distance: 17 ft Assist level: Supervision or verbal cues   Wheelchair Wheelchair activity did not occur: Safety/medical concerns (per report) Type: Manual Max wheelchair distance: 150 ft Assist Level: Supervision or verbal cues  Cognition Comprehension Comprehension  assist level: Follows complex conversation/direction with no assist  Expression Expression assist level: Expresses complex ideas: With no assist  Social Interaction Social Interaction assist level: Interacts appropriately with others - No medications needed.  Problem Solving Problem solving assist level: Solves complex problems: Recognizes & self-corrects  Memory Memory assist level: Complete Independence: No helper   Medical Problem List and Plan: 1. Functional and mobility deficitssecondary to inguinal/thigh abscess and sepsis with subsequent debility  continue CIR therapies, on hold today for holiday 2. DVT Prophylaxis/Anticoagulation: Lovenox 3. Pain Management: Premedicating with oxycodone prior to dressing changes has been successful 4. Mood: Motivated to put best effort and get home. LCSW to follow for evaluation and support.  5. Neuropsych: This patient iscapable of making decisions on herown behalf. 6. Skin/Wound Care: Continue Wet to dry dressing changes bid. Husband learning wound care.--continue to educate other family members.  7. Fluids/Electrolytes/Nutrition: good PO intake 8. Necrotizing fasciitis with right groin abscess s/p I and D: To continue wet to dry dressing changed to right mons/thigh wound. Daily showers recommended.   -Completed IV Zosyn/Diflucan x21 days 9. Morbid obesity: Encourage appropriate diet and weight loss. Also discussed increasing activity level as patient sedentary. She reports that she has lost 50 lbs in the past 2 years and off BP/DM meds.    - dietician consulted.  10. Reactive leucocytosis: down to 7.9 today. 11. Stress induced hyperglycemia: Hgb A1c- 5.6 (10/2015). continue CM restrictions to diet.  12. Critical illness anemia:   Hb 8.2 11/22  No obvious blood loss   Labs ordered for tomorrow 13. Periurethral ulcers with chronic cystitis/frequency/retention: .   -pt wants to continue voiding trial  -added ditropan to help with  frequency  -if remains persistent can consider replacement of foley catheter  UA+, UCx pending  Empiric macrobid started 11/22-11/29 14. Morbid obesity  Body mass index is 52.96 kg/m.  Diet and exercise education  Encourage weight loss to increase endurance and promote overall health   LOS (Days) 6 A FACE TO FACE EVALUATION WAS PERFORMED  Ankit Lorie Phenix 02/28/2016 8:43 AM

## 2016-02-29 ENCOUNTER — Inpatient Hospital Stay (HOSPITAL_COMMUNITY): Payer: Managed Care, Other (non HMO)

## 2016-02-29 ENCOUNTER — Inpatient Hospital Stay (HOSPITAL_COMMUNITY): Payer: Managed Care, Other (non HMO) | Admitting: Occupational Therapy

## 2016-02-29 LAB — CBC
HCT: 26.6 % — ABNORMAL LOW (ref 36.0–46.0)
Hemoglobin: 8.2 g/dL — ABNORMAL LOW (ref 12.0–15.0)
MCH: 29.7 pg (ref 26.0–34.0)
MCHC: 30.8 g/dL (ref 30.0–36.0)
MCV: 96.4 fL (ref 78.0–100.0)
PLATELETS: 456 10*3/uL — AB (ref 150–400)
RBC: 2.76 MIL/uL — ABNORMAL LOW (ref 3.87–5.11)
RDW: 18 % — AB (ref 11.5–15.5)
WBC: 6 10*3/uL (ref 4.0–10.5)

## 2016-02-29 NOTE — Progress Notes (Signed)
Occupational Therapy Session Note  Patient Details  Name: Brandy Hardy MRN: [id number] Date of Birth: 1955-09-13  Today's Date: 02/29/2016 OT Individual Time: 1400-1500 OT Individual Time Calculation (min): 60 min     Short Term Goals: Week 1:  OT Short Term Goal 1 (Week 1): Pt will complete toilet transfer with LRAD and 1 helper OT Short Term Goal 1 - Progress (Week 1): Met OT Short Term Goal 2 (Week 1): Pt will complete UB bathing with supervision while maintaining sitting balance  OT Short Term Goal 2 - Progress (Week 1): Met OT Short Term Goal 3 (Week 1): Pt will complete LB bathing with Min A and AE as needed  OT Short Term Goal 3 - Progress (Week 1): Progressing toward goal Week 2:  OT Short Term Goal 1 (Week 2): STG=LTG secondary to ELOS  Skilled Therapeutic Interventions/Progress Updates:    Pt seen this session to problem solve toileting techniques to avoid urine getting into perineal wound.  Pt received in bed stating she needed to toilet anyway.  Place tegaderm partially over bandage to avoid getting it wet. Could not get it close enough inside folds without sticky film contacting her hair.  Next session will try cling wrap as a barrier.  Sat to EOB with mod A for assist with legs. Used RW to stand pivot to [facility] with S.  BSC set at lowest setting, R leg on step stool. Pt had BM and was able to lean forward and cleanse herself.  Discussed setting up [facility] over toilet as she does not want to have to use BSC in her room at home.  Pt stood from [facility] and ambulated to w/c. OT set up [facility] over toilet but needed to raise it 2 notches.  Pt ambulated into toilet and was able to sit over BSC easily. Once sitting, used a 6 inch step under her feet and she was able to scoot back easily. Pt felt the tall bench step was very helpful.  Pt then transferred off and got back into bed with mod A to lift legs. Informed RN pt had toileted and to check wound bandage to ensure it was dry. Pt in bed with all  needs met.   Therapy Documentation Precautions:  Precautions Precautions: Fall Precaution Comments: R groin abscess, port Restrictions Weight Bearing Restrictions: No    Vital Signs: Therapy Vitals Temp: 98.6 F (37 C) Temp Source: Oral Pulse Rate: 94 Resp: 18 BP: (!) 121/53 Patient Position (if appropriate): Lying Oxygen Therapy SpO2: 97 % O2 Device: Not Delivered Pain: Pain Assessment Pain Assessment: No/denies pain ADL: ADL ADL Comments: Please see functional navigator for ADL status  See Function Navigator for Current Functional Status.   Therapy/Group: Individual Therapy  Sergio Zawislak 02/29/2016, 3:39 PM

## 2016-02-29 NOTE — Progress Notes (Signed)
Patient ID: Brandy Hardy, female   DOB: 08/06/1955, 60 y.o.   MRN: [id number]   Wound check - no surrounding cellulitis Both right groin wounds filling with granulation tissue; minimal drainage; no foul odor Some slough deep in posterior aspect of upper wound  Continue dressing changes  Recheck Monday  Brandy Hardy. Georgette Dover, MD, [facility] Surgery  General/ Trauma Surgery  02/29/2016 9:08 AM

## 2016-02-29 NOTE — Progress Notes (Signed)
Physical Therapy Session Note  Patient Details  Name: Brandy Hardy MRN: [id number] Date of Birth: [birth year]  Today's Date: 02/29/2016 PT Individual Time: 0930-1043 PT Individual Time Calculation (min): 73 min    Short Term Goals: Week 1:  PT Short Term Goal 1 (Week 1): Patient will transfer sit <> stand using RW with assist of 1 person. PT Short Term Goal 2 (Week 1): Patient will perform bed mobility with consistent min A.  PT Short Term Goal 3 (Week 1): Patient will ambulate 25 ft with min A using LRAD. PT Short Term Goal 4 (Week 1): Patient will initiate stair training.   Skilled Therapeutic Interventions/Progress Updates:    Session focused on d/c planning and discussion in regards to equipment and home set-up, transfers, bed mobility re-training (log roll technique to the L to simulate home environment), gait training with RW and with rollator (pt with questions about this device initially so performed a trial), toilet transfers, and overall endurance. Trial with rollator was unsuccessful - pt feels it is too unstable and PT agrees at this time. Educated on limitations and benefits of this device. At this time, recommending bariatric RW for d/c and pt already has a w/c at home to use for longer distances. Pt in agreement. Pt completed all transfers at close supervision to steadying assist (initial sit <> Stand) and able to progress gait distance x 30' and x 32' for the longest today. Other distances about 84' with rollator. Recommended trial with leg lifter for LE management for bed mobility in future session.   Therapy Documentation Precautions:  Precautions Precautions: Fall Precaution Comments: R groin abscess, port Restrictions Weight Bearing Restrictions: No   Pain:  Denies pain.    See Function Navigator for Current Functional Status.   Therapy/Group: Individual Therapy  Canary Brim Ivory Broad, PT, DPT  02/29/2016, 12:24 PM

## 2016-02-29 NOTE — Progress Notes (Signed)
Occupational Therapy Session Note  Patient Details  Name: Brandy Hardy MRN: [id number] Date of Birth: Nov 28, 1955  Today's Date: 02/29/2016 OT Individual Time: 0700-0800 OT Individual Time Calculation (min): 60 min     Short Term Goals: Week 2:  OT Short Term Goal 1 (Week 2): STG=LTG secondary to ELOS  Skilled Therapeutic Interventions/Progress Updates:    Pt resting in bed upon arrival with husband present.  Pt performed supine>sit EOB at supervision level with HOB elevated and with use of bed rails.  Pt completed bathing tasks seated EOB.  Pt continues to wear hospital gowns secondary to continued frequent dressing changes in groin/uppr inner thigh area.  AE introduced and pt educated on use.  Pt uses reacher and sock aide to assist with doffing/donning socks.  Pt performed return demonstration independently.  Pt stated she is still experiencing urinary urgency and is maintaining a time toileting schedule of Q2 hrs.  Pt performed sit<>stand from EOB at supervision level and side stepped to move towards HOB.  Pt required max A (lifting BLE) for sit>supine. Pt returned to bed to await wound care RN/MD.  Focus on activity tolerance, bed mobility, sitting balance, sit<>stand, continued discharge planning, ands safety awareness to increase independence with BADLs  Therapy Documentation Precautions:  Precautions Precautions: Fall Precaution Comments: R groin abscess, port Restrictions Weight Bearing Restrictions: No Pain:  Pt denied pain  See Function Navigator for Current Functional Status.   Therapy/Group: Individual Therapy  [name] 02/29/2016, 8:54 AM

## 2016-02-29 NOTE — Progress Notes (Signed)
[facility] PHYSICAL MEDICINE & REHABILITATION     PROGRESS NOTE    Subjective/Complaints: Pt sitting up in bed this AM.  She slept well overnight.  She denies complaints.   ROS: Denies CP, SOB, N/V/D.  Objective: Vital Signs: Blood pressure (!) 127/52, pulse 78, temperature 98.2 F (36.8 C), temperature source Oral, resp. rate 18, height 5\' 3"  (1.6 m), weight 135.6 kg (298 lb 15.1 oz), SpO2 94 %. No results found.  Recent Labs  02/27/16 0535 02/29/16 0500  WBC 7.9 6.0  HGB 8.2* 8.2*  HCT 26.2* 26.6*  PLT 472* 456*   No results for input(s): NA, K, CL, GLUCOSE, BUN, CREATININE, CALCIUM in the last 72 hours.  Invalid input(s): CO CBG (last 3)  No results for input(s): GLUCAP in the last 72 hours.  Wt Readings from Last 3 Encounters:  02/22/16 135.6 kg (298 lb 15.1 oz)  02/22/16 135 kg (297 lb 9.6 oz)  10/08/15 122.5 kg (270 lb)    Physical Exam:  Constitutional: NAD. Vital signs reviewed. Obese. HENT: AT, [place] Eyes: EOMI. No discharge.  Cardiovascular: RRR. No JVD. Respiratory:cleart to ausc. Unlabored.  GI: Soft. NT  Musculoskeletal: Normal range of motion. She exhibits peripheral edema.  Neurological: She is alertand oriented.  Motor: UE 5/5 B/l LE: 4-4+/5 prox to distal Skin: Skin is warmand dry.  Granulation and adipose tissue in groin/thigh with dressing.   Psychiatric: normal affect. Normal behavior  Assessment/Plan: 1. Functional and mobility deficits  secondary to sepsis/abscessed wounds which require 3+ hours per day of interdisciplinary therapy in a comprehensive inpatient rehab setting. Physiatrist is providing close team supervision and 24 hour management of active medical problems listed below. Physiatrist and rehab team continue to assess barriers to discharge/monitor patient progress toward functional and medical goals.  Function:  Bathing Bathing position   Position: Sitting EOB  Bathing parts Body parts bathed by patient: Right arm, Left  arm, Chest, Abdomen, Right upper leg, Left upper leg Body parts bathed by helper: Buttocks, Front perineal area, Right lower leg, Left lower leg, Back  Bathing assist Assist Level: Touching or steadying assistance(Pt > 75%)      Upper Body Dressing/Undressing Upper body dressing   What is the patient wearing?: Hospital gown     Pull over shirt/dress - Perfomed by patient: Thread/unthread right sleeve, Thread/unthread left sleeve, Put head through opening, Pull shirt over trunk   Button up shirt - Perfomed by patient: Thread/unthread right sleeve, Thread/unthread left sleeve, Pull shirt around back, Button/unbutton shirt      Upper body assist Assist Level: Supervision or verbal cues   Set up : To obtain clothing/put away  Lower Body Dressing/Undressing Lower body dressing   What is the patient wearing?: Hospital Gown, Non-skid slipper socks           Non-skid slipper socks- Performed by helper: Don/doff right sock, Don/doff left sock                  Lower body assist Assist for lower body dressing:  (Max A)      Toileting Toileting Toileting activity did not occur: Safety/medical concerns Toileting steps completed by patient: Adjust clothing after toileting, Adjust clothing prior to toileting Toileting steps completed by helper: Adjust clothing prior to toileting, Performs perineal hygiene, Adjust clothing after toileting    Toileting assist Assist level: Two helpers   Transfers Chair/bed transfer   Chair/bed transfer method: Stand pivot Chair/bed transfer assist level: Supervision or verbal cues Chair/bed transfer assistive  device: Financial risk analyst activity did not occur: Safety/medical concerns (per report)   Max distance: 17 ft Assist level: Supervision or verbal cues   Wheelchair Wheelchair activity did not occur: Safety/medical concerns (per report) Type: Manual Max wheelchair distance: 150 ft Assist Level: Supervision or  verbal cues  Cognition Comprehension Comprehension assist level: Follows complex conversation/direction with no assist  Expression Expression assist level: Expresses complex ideas: With no assist  Social Interaction Social Interaction assist level: Interacts appropriately with others - No medications needed.  Problem Solving Problem solving assist level: Solves complex problems: Recognizes & self-corrects  Memory Memory assist level: Complete Independence: No helper   Medical Problem List and Plan: 1. Functional and mobility deficitssecondary to inguinal/thigh abscess and sepsis with subsequent debility  continue CIR therapies 2. DVT Prophylaxis/Anticoagulation: Lovenox 3. Pain Management: Premedicating with oxycodone prior to dressing changes has been successful 4. Mood: Motivated to put best effort and get home. LCSW to follow for evaluation and support.  5. Neuropsych: This patient iscapable of making decisions on herown behalf. 6. Skin/Wound Care: Continue Wet to dry dressing changes bid. Husband learning wound care.--continue to educate other family members.  7. Fluids/Electrolytes/Nutrition: good PO intake 8. Necrotizing fasciitis with right groin abscess s/p I and D: To continue wet to dry dressing changed to right mons/thigh wound. Daily showers recommended.   -Completed IV Zosyn/Diflucan x21 days 9. Morbid obesity: Encourage appropriate diet and weight loss. Also discussed increasing activity level as patient sedentary. She reports that she has lost 50 lbs in the past 2 years and off BP/DM meds.    - dietician consulted.  10. Reactive leucocytosis: Resolved  WBCs 6.0 11/24 11. Stress induced hyperglycemia: Hgb A1c- 5.6 (10/2015). continue CM restrictions to diet.  12. Critical illness anemia:   Hb 8.2 11/24  No obvious blood loss  13. Periurethral ulcers with chronic cystitis/frequency/retention: .   -pt wants to continue voiding trial  -added ditropan to help with  frequency  -if remains persistent can consider replacement of foley catheter  UA+, UCx Neg  Macrobid d/ced 14. Morbid obesity  Body mass index is 52.96 kg/m.  Diet and exercise education  Encourage weight loss to increase endurance and promote overall health   LOS (Days) 7 A FACE TO FACE EVALUATION WAS PERFORMED  Ankit Lorie Phenix 02/29/2016 8:42 AM

## 2016-03-01 ENCOUNTER — Inpatient Hospital Stay (HOSPITAL_COMMUNITY): Payer: Managed Care, Other (non HMO) | Admitting: Physical Therapy

## 2016-03-01 ENCOUNTER — Inpatient Hospital Stay (HOSPITAL_COMMUNITY): Payer: Managed Care, Other (non HMO)

## 2016-03-01 ENCOUNTER — Encounter (HOSPITAL_COMMUNITY): Payer: Managed Care, Other (non HMO)

## 2016-03-01 NOTE — Progress Notes (Signed)
Physical Therapy Weekly Progress Note  Patient Details  Name: Brandy Hardy MRN: [id number] Date of Birth: 20-Nov-1955  Beginning of progress report period: February 23, 2016 End of progress report period: March 01, 2016  Today's Date: 03/01/2016 PT Individual Time: 1127-1207 PT Individual Time Calculation (min): 40 min    Patient has made steady progress and has met 3 of 4 short term goals.  Pt is currently min-mod A for bed mobility on flat bed, with rail, supervision-min A for sit<>stand and bed <> chair transfers and ambulation with RW short distances; pt did initiate stair training to prepare for use of mobile steps to exit/enter tall [place] Niece but husband is constructing ramp for home entry/exit.    Patient continues to demonstrate the following deficits: pain, decreased strength and ROM bilat LE R>L weakness, edema, impaired activity tolerance/endurance, impaired balance, gait and therefore will continue to benefit from skilled PT intervention to enhance overall performance with activity tolerance, balance and functional use of  right lower extremity and left lower extremity.  Patient not progressing toward long term goals.  See goal revision..  Plan of care revisions: bed mobility goal downgraded and ramp goal added for home entry/exit.  PT Short Term Goals Week 1:  PT Short Term Goal 1 (Week 1): Patient will transfer sit <> stand using RW with assist of 1 person. PT Short Term Goal 1 - Progress (Week 1): Met PT Short Term Goal 2 (Week 1): Patient will perform bed mobility with consistent min A.  PT Short Term Goal 2 - Progress (Week 1): Met PT Short Term Goal 3 (Week 1): Patient will ambulate 25 ft with min A using LRAD. PT Short Term Goal 3 - Progress (Week 1): Met PT Short Term Goal 4 (Week 1): Patient will initiate stair training.  PT Short Term Goal 4 - Progress (Week 1): Met Week 2:  PT Short Term Goal 1 (Week 2): = LTG which were downgraded due to slow progress   Skilled  Therapeutic Interventions/Progress Updates:  Pt received in bed after OT bathing and dressing session; awaiting RN to replace groin dressing.  While awaiting RN engaged pt in discussion regarding goals, progress towards goals and areas to focus on until D/C including flat bed mobility, low furniture transfers, van entry/exit and ramp negotiation.  Pt reports husband is building a step to assist her with entering/exiting Zenaida Niece.  Discussed with husband over phone with pt present his design for the mobile stairs-3 (3.25") wooden stairs for pt to negotiate to reach [place] seat; discussed pt either performing forwards or backwards and UE support.  Husband to bring in stairs Monday to practice with and make adjustments to.  He also reports he is constructing ramp for home entry/exit this weekend.  Following discussion RN present to give medications; RN required reminder of dressing change.  Pt missed 20 min PT due to wound care/nursing care.  Performed supine > sit with bed rail from flat bed with min A.  Performed sit > stand from bed and transfer stand pivot with RW with supervision to w/c.  In gym performed 3 stair negotiation (3") forwards to ascend and descend with bilat UE support on rails with step to sequence and verbal cues for safe sequence and pivoting at top with min A but heavy reliance on UE.  Attempted one step backwards with UE support on rails but pt feels this will be too difficult to enter van.  Returned to w/c and to room where pt performed stand  pivot back to bed with supervision.  Pt requesting to use bed pan for BM.  Required mod A for sit > supine and min A to roll to place bed pan.  Pt to be assisted off of bed pain with NT.  Therapy Documentation Precautions:  Precautions Precautions: Fall Precaution Comments: R groin abscess, port Restrictions Weight Bearing Restrictions: No General: PT Amount of Missed Time (min): 20 Minutes PT Missed Treatment Reason: Wound care;Nursing care  See  Function Navigator for Current Functional Status.  Therapy/Group: Individual Therapy  Edman Circle [facility] 03/01/2016, 12:19 PM

## 2016-03-01 NOTE — Progress Notes (Signed)
Occupational Therapy Session Note  Patient Details  Name: Tonnette Zatorski MRN: [id number] Date of Birth: 04-01-1956  Today's Date: 03/01/2016 OT Individual Time: 1000-1100 OT Individual Time Calculation (min): 60 min   Short Term Goals: Week 2:  OT Short Term Goal 1 (Week 2): STG=LTG secondary to ELOS  Skilled Therapeutic Interventions/Progress Updates:   ADL-retraining at shower level with wound uncovered as per PA-C order, 02/27/16.  RN made aware of plan and was advised of need for dressing change after session.   Patient received supine in bed and ready for treatment.  Pt able to rise to EOB unassisted using bed rail.  With setup to place RW, pt ambulated to bathroom to first void urine/BM and progress to shower level bathing using RW.   Pt required min assist for thoroughness with hygiene and min instructional cues and contact guard for safety to complete mobility and transfer to tub bench using RW and grab bar.   Pt able to bathe seated and stood to allow OT to wash back and buttocks.   No wound drainage noted during shower as patient was unable to lift skin folds and would not attempt getting wound wet.   Pt required min assist to dry herself and returned to wearing gown while awaiting RN to change dressing.   Pt ambulated back to bed at end of session with mod assist to lift legs and reposition.  Therapy Documentation Precautions:  Precautions Precautions: Fall Precaution Comments: R groin abscess, port Restrictions Weight Bearing Restrictions: No   Pain: No/denies pain     ADL: ADL ADL Comments: Please see functional navigator for ADL status   See Function Navigator for Current Functional Status.   Therapy/Group: Individual Therapy   Second session: Time:1345-1430 OT Individual Time:  45 min  Pain Assessment:  Skilled Therapeutic Interventions: ADL-retraining with focus on toileting, toilet transfers, functional mobility using RW (30 min), therex seated at Sci-Fit (15  min) with focus on general endurance, and BUE strengthening.   Pt received supine in bed and requesting assist to bathroom.  With setup to place RW, pt was able to rise to EOB using bed rail and HOB elevated.  Pt completed stand from EOB unassisted and ambulated to bathroom with close supervision.   Pt managed gown unassisted and voided urine and BM with extra time required to perform hygiene due loose BM.   Pt required assist for hygiene for thoroughness and ambulated to w/c d/t fatigue, completing hand hygiene at sink with setup to place w/c.   Pt was then escorted to gym and completed 10 min of therex using Sci-Fit, seated, with 1 rest break after 5 minutes.  Pt reported only discomfort at hands limited her endurance of task.   Pt was then escorted back to her room to completed transfer back to bed with mod assist to manage RLE.  OT provided leg lifter for assist however pt was not able to lift leg using device during this session.   See FIM for current functional status  Therapy/Group: IndividualTherapy  [place] 03/01/2016, 12:47 PM

## 2016-03-01 NOTE — Progress Notes (Signed)
[place] PHYSICAL MEDICINE & REHABILITATION     PROGRESS NOTE    Subjective/Complaints: Pt laying in bed this AM.  She states she slept for about 6 hours overnight.  Wound evaluated by Surgery yesterday.   ROS: Denies CP, SOB, N/V/D.  Objective: Vital Signs: Blood pressure 122/63, pulse 89, temperature 98.8 F (37.1 C), temperature source Oral, resp. rate 17, height 5\' 3"  (1.6 m), weight 135.6 kg (298 lb 15.1 oz), SpO2 95 %. No results found.  Recent Labs  02/29/16 0500  WBC 6.0  HGB 8.2*  HCT 26.6*  PLT 456*   No results for input(s): NA, K, CL, GLUCOSE, BUN, CREATININE, CALCIUM in the last 72 hours.  Invalid input(s): CO CBG (last 3)  No results for input(s): GLUCAP in the last 72 hours.  Wt Readings from Last 3 Encounters:  02/22/16 135.6 kg (298 lb 15.1 oz)  02/22/16 135 kg (297 lb 9.6 oz)  10/08/15 122.5 kg (270 lb)    Physical Exam:  Constitutional: NAD. Vital signs reviewed. Obese. HENT: AT, [place] Eyes: EOMI. No discharge.  Cardiovascular: RRR. No JVD. Respiratory:cleart to ausc. Unlabored.  GI: Soft. NT  Musculoskeletal: Normal range of motion. She exhibits peripheral edema.  Neurological: She is alertand oriented.  Motor: UE 5/5 B/l LE: 4-4+/5 prox to distal (stable) Skin: Skin is warmand dry.  Granulation and adipose tissue in groin/thigh with dressing c/d/i.   Psychiatric: normal affect. Normal behavior  Assessment/Plan: 1. Functional and mobility deficits  secondary to sepsis/abscessed wounds which require 3+ hours per day of interdisciplinary therapy in a comprehensive inpatient rehab setting. Physiatrist is providing close team supervision and 24 hour management of active medical problems listed below. Physiatrist and rehab team continue to assess barriers to discharge/monitor patient progress toward functional and medical goals.  Function:  Bathing Bathing position   Position: Sitting EOB  Bathing parts Body parts bathed by patient: Right  arm, Left arm, Chest, Abdomen, Right upper leg, Left upper leg Body parts bathed by helper: Front perineal area, Buttocks, Right lower leg, Left lower leg, Back  Bathing assist Assist Level: Touching or steadying assistance(Pt > 75%)      Upper Body Dressing/Undressing Upper body dressing   What is the patient wearing?: Hospital gown     Pull over shirt/dress - Perfomed by patient: Thread/unthread right sleeve, Thread/unthread left sleeve, Put head through opening, Pull shirt over trunk   Button up shirt - Perfomed by patient: Thread/unthread right sleeve, Thread/unthread left sleeve, Pull shirt around back, Button/unbutton shirt      Upper body assist Assist Level: Supervision or verbal cues   Set up : To obtain clothing/put away  Lower Body Dressing/Undressing Lower body dressing   What is the patient wearing?: Hospital Gown, Non-skid slipper socks         Non-skid slipper socks- Performed by patient: Don/doff right sock, Don/doff left sock Non-skid slipper socks- Performed by helper: Don/doff right sock, Don/doff left sock                  Lower body assist Assist for lower body dressing:  (Max A)      Toileting Toileting Toileting activity did not occur: Safety/medical concerns Toileting steps completed by patient: Performs perineal hygiene, Adjust clothing prior to toileting, Adjust clothing after toileting Toileting steps completed by helper: Adjust clothing prior to toileting, Performs perineal hygiene, Adjust clothing after toileting    Toileting assist Assist level: Two helpers   Transfers Chair/bed transfer   Chair/bed transfer  method: Stand pivot, Ambulatory Chair/bed transfer assist level: Supervision or verbal cues Chair/bed transfer assistive device: Walker, Air cabin crew Ambulation activity did not occur: Safety/medical concerns (per report)   Max distance: 20' Assist level: Supervision or verbal cues   Wheelchair  Wheelchair activity did not occur: Safety/medical concerns (per report) Type: Manual Max wheelchair distance: 150 ft Assist Level: Supervision or verbal cues  Cognition Comprehension Comprehension assist level: Follows complex conversation/direction with no assist  Expression Expression assist level: Expresses complex ideas: With no assist  Social Interaction Social Interaction assist level: Interacts appropriately with others with medication or extra time (anti-anxiety, antidepressant).  Problem Solving Problem solving assist level: Solves complex problems: Recognizes & self-corrects  Memory Memory assist level: Complete Independence: No helper   Medical Problem List and Plan: 1. Functional and mobility deficitssecondary to inguinal/thigh abscess and sepsis with subsequent debility  continue CIR therapies 2. DVT Prophylaxis/Anticoagulation: Lovenox 3. Pain Management: Premedicating with oxycodone prior to dressing changes has been successful 4. Mood: Motivated to put best effort and get home. LCSW to follow for evaluation and support.  5. Neuropsych: This patient iscapable of making decisions on herown behalf. 6. Skin/Wound Care: Continue Wet to dry dressing changes bid. Husband learning wound care.--continue to educate other family members.   Appreciate surgery recs.  7. Fluids/Electrolytes/Nutrition: good PO intake 8. Necrotizing fasciitis with right groin abscess s/p I and D: To continue wet to dry dressing changed to right mons/thigh wound. Daily showers recommended.   -Completed IV Zosyn/Diflucan x21 days 9. Morbid obesity: Encourage appropriate diet and weight loss. Also discussed increasing activity level as patient sedentary. She reports that she has lost 50 lbs in the past 2 years and off BP/DM meds.    - dietician consulted.  10. Reactive leucocytosis: Resolved  WBCs 6.0 11/24 11. Stress induced hyperglycemia: Hgb A1c- 5.6 (10/2015). continue CM restrictions to diet.   12. Critical illness anemia:   Hb 8.2 11/24  No obvious blood loss  13. Periurethral ulcers with chronic cystitis/frequency/retention: .   -added ditropan to help with frequency  -if remains persistent can consider replacement of foley catheter  UA+, UCx Neg  Macrobid d/ced   LOS (Days) 8 A FACE TO FACE EVALUATION WAS PERFORMED  Anila Bojarski Lorie Phenix 03/01/2016 8:07 AM

## 2016-03-01 NOTE — Progress Notes (Signed)
Patient's PVR this am is 494 cc. She wants to wait a little while and try to void again, refusing to be in and out cathed for now. Will pass report to next shift.

## 2016-03-02 ENCOUNTER — Inpatient Hospital Stay (HOSPITAL_COMMUNITY): Payer: Managed Care, Other (non HMO) | Admitting: Physical Therapy

## 2016-03-02 ENCOUNTER — Inpatient Hospital Stay (HOSPITAL_COMMUNITY): Payer: Managed Care, Other (non HMO)

## 2016-03-02 MED ORDER — LIDOCAINE HCL 2 % EX GEL
1.0000 | Freq: Once | CUTANEOUS | Status: DC
Start: 2016-03-02 — End: 2016-03-05
  Filled 2016-03-02: qty 5

## 2016-03-02 NOTE — Progress Notes (Signed)
Attempted foley insertion x2 and once by A. Royal, RN without success.  Dr. Posey Pronto notified

## 2016-03-02 NOTE — Progress Notes (Signed)
Physical Therapy Session Note  Patient Details  Name: Brandy Hardy MRN: [id number] Date of Birth: 01-Jan-1956  Today's Date: 03/02/2016 PT Individual Time: 1003-1101 PT Individual Time Calculation (min): 58 min    Short Term Goals: Week 2:  PT Short Term Goal 1 (Week 2): = LTG which were downgraded due to slow progress  Skilled Therapeutic Interventions/Progress Updates:    Pt received in bed noting 6/10 pain from failed attempts at catheter insertion but premedicated. Pt transferred out of hospital bed with supervision & use of bed rails & HOB elevated. Pt requested to use restroom & ambulated 15 ft + 10 ft with RW & supervision overall to bathroom then to w/c. Pt with continent void & BM & performed peri-hygiene with supervision overall. Pt utilized elevated commode seat & step to position herself on seat. Pt propelled w/c throughout unit with BUE & supervision (150 ft + 50 ft) for cardiopulmonary endurance training. Adjusted pt's BLE ELR's for increased positioning & comfort. Pt performed bed mobility in rehab apartment with use of leg lifter, significantly extra time, and max cuing. Pt requires encouragement to continue task, as well as max cuing to scoot buttocks onto bed then transfer legs on. Pt's bed is taller than rehab apartment bed (pt's = 25 1/2 inches, rehab apartment = 22 inches), therefore pt will benefit from further practice and potential use of step to assist her. Pt able to ambulate within rehab apartment, side-stepping in small areas with RW, with supervision assist. At end of session pt left in w/c with in room with all needs within reach.    Therapy Documentation Precautions:  Precautions Precautions: Fall Precaution Comments: R groin abscess, port Restrictions Weight Bearing Restrictions: No   See Function Navigator for Current Functional Status.   Therapy/Group: Individual Therapy  Waunita Schooner 03/02/2016, 12:10 PM

## 2016-03-02 NOTE — Progress Notes (Signed)
[place] PHYSICAL MEDICINE & REHABILITATION     PROGRESS NOTE    Subjective/Complaints: Pt laying in bed this AM.  She had trouble sleeping overnight due to frequent urination and dressing changes  Discussed with nursing as well.    ROS: Denies CP, SOB, N/V/D.  Objective: Vital Signs: Blood pressure (!) 117/50, pulse 86, temperature 98.9 F (37.2 C), temperature source Oral, resp. rate 17, height 5\' 3"  (1.6 m), weight 135.6 kg (298 lb 15.1 oz), SpO2 94 %. No results found.  Recent Labs  02/29/16 0500  WBC 6.0  HGB 8.2*  HCT 26.6*  PLT 456*   No results for input(s): NA, K, CL, GLUCOSE, BUN, CREATININE, CALCIUM in the last 72 hours.  Invalid input(s): CO CBG (last 3)  No results for input(s): GLUCAP in the last 72 hours.  Wt Readings from Last 3 Encounters:  02/22/16 135.6 kg (298 lb 15.1 oz)  02/22/16 135 kg (297 lb 9.6 oz)  10/08/15 122.5 kg (270 lb)    Physical Exam:  Constitutional: NAD. Vital signs reviewed. Obese. HENT: AT, New Site Eyes: EOMI. No discharge.  Cardiovascular: RRR. No JVD. Respiratory:cleart to ausc. Unlabored.  GI: Soft. NT  Musculoskeletal: Normal range of motion. She exhibits peripheral edema.  Neurological: She is alertand oriented.  Motor: UE 5/5 B/l LE: 4+/5 prox to distal (stable) Skin: Skin is warmand dry.  Granulation and adipose tissue in groin/thigh with dressing c/d/i.   Psychiatric: normal affect. Normal behavior  Assessment/Plan: 1. Functional and mobility deficits  secondary to sepsis/abscessed wounds which require 3+ hours per day of interdisciplinary therapy in a comprehensive inpatient rehab setting. Physiatrist is providing close team supervision and 24 hour management of active medical problems listed below. Physiatrist and rehab team continue to assess barriers to discharge/monitor patient progress toward functional and medical goals.  Function:  Bathing Bathing position   Position: Shower  Bathing parts Body parts  bathed by patient: Right arm, Left arm, Chest, Abdomen, Right upper leg, Left upper leg, Front perineal area Body parts bathed by helper: Back, Left lower leg, Right lower leg, Buttocks  Bathing assist Assist Level: Touching or steadying assistance(Pt > 75%)      Upper Body Dressing/Undressing Upper body dressing   What is the patient wearing?: Hospital gown     Pull over shirt/dress - Perfomed by patient: Thread/unthread right sleeve, Thread/unthread left sleeve, Put head through opening, Pull shirt over trunk   Button up shirt - Perfomed by patient: Thread/unthread right sleeve, Thread/unthread left sleeve, Pull shirt around back, Button/unbutton shirt      Upper body assist Assist Level: Touching or steadying assistance(Pt > 75%)   Set up : To obtain clothing/put away  Lower Body Dressing/Undressing Lower body dressing   What is the patient wearing?: Hospital Gown, Non-skid slipper socks         Non-skid slipper socks- Performed by patient: Don/doff right sock, Don/doff left sock Non-skid slipper socks- Performed by helper: Don/doff right sock, Don/doff left sock                  Lower body assist Assist for lower body dressing:  (Max A)      Toileting Toileting Toileting activity did not occur: Safety/medical concerns Toileting steps completed by patient: Adjust clothing prior to toileting, Performs perineal hygiene, Adjust clothing after toileting Toileting steps completed by helper: Performs perineal hygiene    Toileting assist Assist level: Touching or steadying assistance (Pt.75%)   Transfers Chair/bed transfer   Chair/bed transfer  method: Stand pivot, Ambulatory Chair/bed transfer assist level: Supervision or verbal cues Chair/bed transfer assistive device: Walker, Air cabin crew Ambulation activity did not occur: Safety/medical concerns (per report)   Max distance: 46' Assist level: Supervision or verbal cues   Wheelchair  Wheelchair activity did not occur: Safety/medical concerns (per report) Type: Manual Max wheelchair distance: 150 ft Assist Level: Supervision or verbal cues  Cognition Comprehension Comprehension assist level: Follows complex conversation/direction with no assist  Expression Expression assist level: Expresses complex ideas: With no assist  Social Interaction Social Interaction assist level: Interacts appropriately with others - No medications needed.  Problem Solving Problem solving assist level: Solves complex problems: Recognizes & self-corrects  Memory Memory assist level: Complete Independence: No helper   Medical Problem List and Plan: 1. Functional and mobility deficitssecondary to inguinal/thigh abscess and sepsis with subsequent debility  continue CIR therapies 2. DVT Prophylaxis/Anticoagulation: Lovenox 3. Pain Management: Premedicating with oxycodone prior to dressing changes has been successful 4. Mood: Motivated to put best effort and get home. LCSW to follow for evaluation and support.  5. Neuropsych: This patient iscapable of making decisions on herown behalf. 6. Skin/Wound Care: Continue Wet to dry dressing changes bid. Husband learning wound care.--continue to educate other family members.   Appreciate surgery recs.  7. Fluids/Electrolytes/Nutrition: good PO intake 8. Necrotizing fasciitis with right groin abscess s/p I and D: To continue wet to dry dressing changed to right mons/thigh wound. Daily showers recommended.   -Completed IV Zosyn/Diflucan x21 days 9. Morbid obesity: Encourage appropriate diet and weight loss. Also discussed increasing activity level as patient sedentary. She reports that she has lost 50 lbs in the past 2 years and off BP/DM meds.    - dietician consulted.  10. Reactive leucocytosis: Resolved  WBCs 6.0 11/24 11. Stress induced hyperglycemia: Hgb A1c- 5.6 (10/2015). continue CM restrictions to diet.  12. Critical illness anemia:    Hb 8.2 11/24  No obvious blood loss  13. Periurethral ulcers with chronic cystitis/frequency/retention: .   ditropan to help with frequency d/ced  Foley replaced 11/26 due to location of wound, frequent urination, soiled dressing.  Various different options tried and discussed with nursing.    UA+, UCx Neg  Macrobid d/ced   LOS (Days) 9 A FACE TO FACE EVALUATION WAS PERFORMED  Ankit Lorie Phenix 03/02/2016 8:03 AM

## 2016-03-03 ENCOUNTER — Inpatient Hospital Stay (HOSPITAL_COMMUNITY): Payer: Managed Care, Other (non HMO) | Admitting: Physical Therapy

## 2016-03-03 ENCOUNTER — Inpatient Hospital Stay (HOSPITAL_COMMUNITY): Payer: Managed Care, Other (non HMO) | Admitting: Occupational Therapy

## 2016-03-03 NOTE — Progress Notes (Signed)
Occupational Therapy Session Note  Patient Details  Name: Brandy Hardy MRN: [id number] Date of Birth: 09/08/1955  Today's Date: 03/03/2016 OT Individual Time: 0703-0800 OT Individual Time Calculation (min): 57 min     Short Term Goals: Week 2:  OT Short Term Goal 1 (Week 2): STG=LTG secondary to ELOS  Skilled Therapeutic Interventions/Progress Updates:    Upon entering the room, pt supine in bed with no c/o pain this session. Pt reports she had multiple dressing changes secondary to urine soiling dressing. Pt has catheter placed at this time in order to promote healing and keep area dry. Pt declined shower this session but requesting to wash for EOB. Pt performed task with mod A for LB self care. Pt donned pull over dress with set up A to obtain clothing items. Pt ambulating 10' in room with RW and supervision for safety to sit in wheelchair. Pt utilized reacher to doff B socks and sock aid to don clean socks. Pt propelled wheelchair to sink for grooming tasks with supervision. Pt returning to bed at end of session for MD to look at dressing at end of session. Call bell and all needed items within reach. Pt fatigued at end of session.  Therapy Documentation Precautions:  Precautions Precautions: Fall Precaution Comments: R groin abscess, port Restrictions Weight Bearing Restrictions: No    ADL: ADL ADL Comments: Please see functional navigator for ADL status  See Function Navigator for Current Functional Status.   Therapy/Group: Individual Therapy  Gypsy Decant 03/03/2016, 11:16 AM

## 2016-03-03 NOTE — Progress Notes (Signed)
[facility] Progress Note     Subjective: Working well with therapies. Pain controlled. Tolerating PO. Having bowel function.  Objective: Vital signs in last 24 hours: Temp:  [98.7 F (37.1 C)] 98.7 F (37.1 C) (11/27 1350) Pulse Rate:  [81-85] 85 (11/27 1350) Resp:  [18] 18 (11/27 1350) BP: (109-110)/(41-46) 109/46 (11/27 1350) SpO2:  [96 %-98 %] 98 % (11/27 1350) Last BM Date: 03/02/16  Intake/Output from previous day: 11/26 0701 - 11/27 0700 In: 480 [P.O.:480] Out: 800 [Urine:800] Intake/Output this shift: Total I/O In: 480 [P.O.:480] Out: 800 [Urine:800]  PE: Right upper thigh/perivaginal exam:  >90% beefy red granulation tissue superior wound with ~5cm narrow tunneling at central aspect of wound. No surrounding cellulitis.      Lab Results:  No results for input(s): WBC, HGB, HCT, PLT in the last 72 hours. BMET No results for input(s): NA, K, CL, CO2, GLUCOSE, BUN, CREATININE, CALCIUM in the last 72 hours. PT/INR No results for input(s): LABPROT, INR in the last 72 hours. CMP     Component Value Date/Time   NA 137 02/23/2016 0700   K 3.7 02/23/2016 0700   CL 100 (L) 02/23/2016 0700   CO2 28 02/23/2016 0700   GLUCOSE 98 02/23/2016 0700   BUN <5 (L) 02/23/2016 0700   CREATININE 0.72 02/23/2016 0700   CALCIUM 8.2 (L) 02/23/2016 0700   PROT 4.7 (L) 02/23/2016 0700   ALBUMIN 1.5 (L) 02/23/2016 0700   AST 26 02/23/2016 0700   ALT 24 02/23/2016 0700   ALKPHOS 76 02/23/2016 0700   BILITOT 0.4 02/23/2016 0700   GFRNONAA >60 02/23/2016 0700   GFRAA >60 02/23/2016 0700   Anti-infectives: Anti-infectives    Start     Dose/Rate Route Frequency Ordered Stop   02/23/16 0800  fluconazole (DIFLUCAN) tablet 200 mg     200 mg Oral Daily 02/22/16 1833     02/22/16 2200  piperacillin-tazobactam (ZOSYN) IVPB 3.375 g  Status:  Discontinued     3.375 g 12.5 mL/hr over 240 Minutes Intravenous Every 8 hours 02/22/16 1833 02/27/16 0824      Assessment/Plan S/P 1.    incision and drainage of right thigh abscess, full thickness debridement of 40cm^2 right groin for necrotizing         Fasciitis (Dr. Kieth Brightly 02/16/16) 2.    Debridement of skin subcutaneous tissue and fascia right groin wound        Debridement to be monitored skin subcutaneous tissue of right thigh wound (Dr. Dalbert Batman 02/20/16)  Continue dressing changes Per patient, she is being discharged Wednesday  She will need to follow up with Dr. Kieth Brightly in 2-3 weeks.   LOS: 10 days    Jill Alexanders , [facility] 03/03/2016, 3:36 PM Pager: [phone] Consults: [phone] Mon-Fri 7:00 am-4:30 pm Sat-Sun 7:00 am-11:30 am

## 2016-03-03 NOTE — Progress Notes (Signed)
[place] PHYSICAL MEDICINE & REHABILITATION     PROGRESS NOTE    Subjective/Complaints: Had 8 hours of sleep last night with foley in. Lock/tape in awkward spot--would like it moved.   ROS: Pt denies fever, rash/itching, headache, blurred or double vision, nausea, vomiting, abdominal pain, diarrhea, chest pain, shortness of breath, palpitations, dysuria, dizziness, neck pain, back pain, bleeding, anxiety, or depression  Objective: Vital Signs: Blood pressure (!) 110/41, pulse 81, temperature 98.7 F (37.1 C), temperature source Oral, resp. rate 18, height 5\' 3"  (1.6 m), weight 135.6 kg (298 lb 15.1 oz), SpO2 96 %. No results found. No results for input(s): WBC, HGB, HCT, PLT in the last 72 hours. No results for input(s): NA, K, CL, GLUCOSE, BUN, CREATININE, CALCIUM in the last 72 hours.  Invalid input(s): CO CBG (last 3)  No results for input(s): GLUCAP in the last 72 hours.  Wt Readings from Last 3 Encounters:  02/22/16 135.6 kg (298 lb 15.1 oz)  02/22/16 135 kg (297 lb 9.6 oz)  10/08/15 122.5 kg (270 lb)    Physical Exam:  Constitutional: NAD, obese HENT: NCAT Eyes: EOMI  Cardiovascular: RRR Respiratory:CTA.  GI: Soft. NT  Musculoskeletal: Normal range of motion. She exhibits ongoing peripheral edema.  Neurological: She is alertand oriented.  Motor: UE 5/5 B/l LE: 4+/5 prox to distal unchanged Skin: Skin is warmand dry.  Granulation and adipose tissue in groin/thigh-improving  Psychiatric: normal affect. Normal behavior  Assessment/Plan: 1. Functional and mobility deficits  secondary to sepsis/abscessed wounds which require 3+ hours per day of interdisciplinary therapy in a comprehensive inpatient rehab setting. Physiatrist is providing close team supervision and 24 hour management of active medical problems listed below. Physiatrist and rehab team continue to assess barriers to discharge/monitor patient progress toward functional and medical  goals.  Function:  Bathing Bathing position   Position: Shower  Bathing parts Body parts bathed by patient: Right arm, Left arm, Chest, Abdomen, Right upper leg, Left upper leg, Front perineal area Body parts bathed by helper: Back, Left lower leg, Right lower leg, Buttocks  Bathing assist Assist Level: Touching or steadying assistance(Pt > 75%)      Upper Body Dressing/Undressing Upper body dressing   What is the patient wearing?: Hospital gown     Pull over shirt/dress - Perfomed by patient: Thread/unthread right sleeve, Thread/unthread left sleeve, Put head through opening, Pull shirt over trunk   Button up shirt - Perfomed by patient: Thread/unthread right sleeve, Thread/unthread left sleeve, Pull shirt around back, Button/unbutton shirt      Upper body assist Assist Level: Touching or steadying assistance(Pt > 75%)   Set up : To obtain clothing/put away  Lower Body Dressing/Undressing Lower body dressing   What is the patient wearing?: Hospital Gown, Non-skid slipper socks         Non-skid slipper socks- Performed by patient: Don/doff right sock, Don/doff left sock Non-skid slipper socks- Performed by helper: Don/doff right sock, Don/doff left sock                  Lower body assist Assist for lower body dressing:  (Max A)      Toileting Toileting Toileting activity did not occur: Safety/medical concerns Toileting steps completed by patient: Adjust clothing prior to toileting, Performs perineal hygiene, Adjust clothing after toileting Toileting steps completed by helper: Performs perineal hygiene Toileting Assistive Devices: Grab bar or rail, Other (comment) (elevated BSC seat)  Toileting assist Assist level: Supervision or verbal cues   Transfers Chair/bed  transfer   Chair/bed transfer method: Ambulatory Chair/bed transfer assist level: Supervision or verbal cues Chair/bed transfer assistive device: Medical sales representative Ambulation activity  did not occur: Safety/medical concerns (per report)   Max distance: 15 ft Assist level: Supervision or verbal cues   Wheelchair Wheelchair activity did not occur: Safety/medical concerns (per report) Type: Manual Max wheelchair distance: 150 ft Assist Level: Supervision or verbal cues  Cognition Comprehension Comprehension assist level: Follows complex conversation/direction with no assist  Expression Expression assist level: Expresses complex ideas: With no assist  Social Interaction Social Interaction assist level: Interacts appropriately with others with medication or extra time (anti-anxiety, antidepressant).  Problem Solving Problem solving assist level: Solves complex problems: Recognizes & self-corrects  Memory Memory assist level: Complete Independence: No helper   Medical Problem List and Plan: 1. Functional and mobility deficitssecondary to inguinal/thigh abscess and sepsis with subsequent debility  -continue therapies 2. DVT Prophylaxis/Anticoagulation: Lovenox 3. Pain Management: Premedicating with oxycodone prior to dressing changes   4. Mood: Motivated to put best effort and get home. LCSW to follow for evaluation and support.  5. Neuropsych: This patient iscapable of making decisions on herown behalf. 6. Skin/Wound Care: wet-dry dressing bid  -family ed 7. Fluids/Electrolytes/Nutrition: good PO intake 8. Necrotizing fasciitis with right groin abscess s/p I and D: To continue wet to dry dressing changed to right mons/thigh wound. Daily showers recommended.   -Completed IV Zosyn/Diflucan course 9. Morbid obesity: Encourage appropriate diet and weight loss. Also discussed increasing activity level as patient sedentary. She reports that she has lost 50 lbs in the past 2 years and off BP/DM meds.    - dietician consulted.  10. Reactive leucocytosis: Resolved  WBCs 6.0 11/24 11. Stress induced hyperglycemia: Hgb A1c- 5.6 (10/2015). continue CM restrictions to diet.   12. Critical illness anemia:   Hb 8.2 11/24--stable  No obvious blood loss  13. Periurethral ulcers with chronic cystitis/frequency/retention: .    -Foley replaced 11/26 due to location of wound, frequent urination, soiled dressing, poor sleep, etc  -  Various different options tried and discussed with nursing.    UA+, UCx Neg      LOS (Days) 10 A FACE TO FACE EVALUATION WAS PERFORMED  SWARTZ,ZACHARY T 03/03/2016 8:24 AM

## 2016-03-03 NOTE — Progress Notes (Signed)
Physical Therapy Session Note  Patient Details  Name: Brandy Hardy MRN: [id number] Date of Birth: 02-21-1956  Today's Date: 03/03/2016 PT Individual Time: 4696-2952 and 1131-1203 and 1434-1531 PT Individual Time Calculation (min): 57 min and 32 min and 57 min   Short Term Goals: Week 2:  PT Short Term Goal 1 (Week 2): = LTG which were downgraded due to slow progress  Skilled Therapeutic Interventions/Progress Updates:    Treatment 1: Pt received in bed & agreeable to tx, denying c/o pain. Provided pt with bag to attach to RW & educated pt on proper transportation of items. Majority of session focused on bed mobility with hospital bed set at 25 1/2 inches to simulate home environment, without bed rails. Pt transferred supine<>sitting x 2 with use of leg lifter, step to assist with sit>supine, and significantly extra time. Pt able to instruct PT on how to assist her & management of catheter following instruction/education on catheter management & positioning. Pt able to scoot to head of bed with cuing for bridging, with pt only able to do so with LLE, and extra time to scoot. Pt plans to have husband build her a step to assist with getting in/out of bed. Pt contacted husband via telephone to request he bring the car she plans to ride home in to next session in order to practice car transfers. Pt ambulated ~50 ft with RW & supervision for endurance training, with notably less fatigue following trial as compared to ambulation on other dates. At end of session pt left sitting in recliner with all needs within reach & BLE elevated.  Treatment 2: Pt received in w/c with husband Virl Diamond) present for session. Discussed home set up & pt does not have enough room to place [facility] over toilet in bathroom therefore pt will need to practice toilet transfer from lower height before d/c. Pt's husband plans to build steps to assist her with toilet transfer & bed transfer, as well as install grab bar by commode.  Transported pt outside via w/c total assist to practice car transfer in [place]. Pt's husband has assembled 3 small steps with a single rail to assist pt into vehicle. Educated pt & husband on positioning and safety (stabilize car door) of stair negotiation & pt able to negotiate stairs and sit in seat of car with supervision overall; pt & husband able to safely manage catheter. At end of session pt left sitting in w/c in room with all needs within reach.   Treatment 3: Pt received in bed & agreeable to tx, denying c/o pain. Pt transferred OOB with bed set at 25 1/2 inches tall to simulate home environment, extra time & leg lifter. Practiced transfers from regular toilet in room & pt able to complete sit<>stand transfer with bilateral grab bars & supervision. Pt with continent BM & performed peri-hygiene (sitting) and hand hygiene (standing) with supervision overall. Pt ambulated within room to bathroom & sink with RW & supervision. Pt propelled w/c room>gym with BUE & supervision for cardiopulmonary endurance training. Pt negotiated 8 steps (3inches) with B rails & supervision with educational cuing for compensatory technique. Activity focused on BLE strengthening and for stair training as pt has to negotiate 3 steps to access [place] seat. Pt ambulated 62 ft to rehab apartment, laterally negotiating 23" space to simulate bathroom door; pt utilized RW & supervision overall. Pt completed transfers from low, soft 16" couch simulating hers at home; pt required supervision. Pt tolerated standing 6 minutes without BUE support focusing on  improving activity tolerance. At end of session pt left in w/c in room with all needs within reach.   Therapy Documentation Precautions:  Precautions Precautions: Fall Precaution Comments: R groin abscess, port Restrictions Weight Bearing Restrictions: No    See Function Navigator for Current Functional Status.   Therapy/Group: Individual Therapy  Sandi Mariscal 03/03/2016, 12:08 PM

## 2016-03-03 NOTE — Progress Notes (Signed)
Reinserted 14Fr Foley cath on first attempt with assistance of Patrici Ranks, Therapist, sports.  Patient with large amount of urine leakage before insertion of catheter.  Dressing to right groin/ pubic area wound saturated with urine.  Pale, yellow urine immediately returned on insertion.  Patient states that she wished to keep foley in place to promote wound healing due to constant soilage of dressing when foley removed.    Brandy Hardy M

## 2016-03-03 NOTE — Plan of Care (Signed)
Problem: RH BLADDER ELIMINATION Goal: RH STG MANAGE BLADDER WITH ASSISTANCE STG Manage Bladder With Mod I  Outcome: Not Progressing Foley catheter in place

## 2016-03-04 ENCOUNTER — Inpatient Hospital Stay (HOSPITAL_COMMUNITY): Payer: Managed Care, Other (non HMO) | Admitting: Occupational Therapy

## 2016-03-04 ENCOUNTER — Inpatient Hospital Stay (HOSPITAL_COMMUNITY): Payer: Managed Care, Other (non HMO) | Admitting: Physical Therapy

## 2016-03-04 LAB — CBC
HEMATOCRIT: 26.1 % — AB (ref 36.0–46.0)
HEMOGLOBIN: 8.5 g/dL — AB (ref 12.0–15.0)
MCH: 31.3 pg (ref 26.0–34.0)
MCHC: 32.6 g/dL (ref 30.0–36.0)
MCV: 96 fL (ref 78.0–100.0)
Platelets: 443 10*3/uL — ABNORMAL HIGH (ref 150–400)
RBC: 2.72 MIL/uL — AB (ref 3.87–5.11)
RDW: 18.8 % — ABNORMAL HIGH (ref 11.5–15.5)
WBC: 6.6 10*3/uL (ref 4.0–10.5)

## 2016-03-04 NOTE — Progress Notes (Signed)
Physical Therapy Note  Patient Details  Name: Brandy Hardy MRN: [id number] Date of Birth: [birth year] Today's Date: 03/04/2016    Time: [phone] 70 minutes  1:1 No c/o pain. Bed mobility performed with leg lifter, supervision and increased time. Gait with RW 150', 30' x 2 with mod I, prolonged rest break after longest walk.  Car transfer to simulated van height with pt able to step up 6'' step to enter [place] with supervision.  Ramp negotiation with RW with supervision, no LOB.  Stair negotiation 3'' steps x 8 with supervision with bilat handrails.  Sit to stand training from 18'' surface to simulate toilet at home x 10 with supervision.  Review of HEP handout for seated and supine exercises with pt expressing understanding.  W/c mobility in controlled environment with supervision x 150'.  Discussed home safety and recommendations for continued PT with pt and husband who both expressed understanding and state they feel ready for d/c home at this level of care.  Brandy Hardy 03/04/2016, 10:43 AM

## 2016-03-04 NOTE — Progress Notes (Addendum)
Physical Therapy Session Note  Patient Details  Name: Brandy Hardy MRN: [id number] Date of Birth: 05-29-1955  Today's Date: 03/04/2016 PT Individual Time: 1305-1401 PT Individual Time Calculation (min): 56 min    Short Term Goals: Week 2:  PT Short Term Goal 1 (Week 2): = LTG which were downgraded due to slow progress  Skilled Therapeutic Interventions/Progress Updates:    Pt received in bed noting 3/10 R groin pain but denied pain meds & agreeable to tx. Pt performed BLE HEP: ankle pumps, short arc quads, heel slides, hip abduction, straight leg raises (AAROM RLE), bridging (inability to clear buttocks), seated push ups with & without armrests, and long arc quads all with cuing for proper technique. Pt reported need to use bathroom & ambulated within room/bathroom with RW & supervision/mod I. Pt able to have continent BM & perform peri-hygiene without assistance. Pt ambulated 120 ft with RW & mod I, demonstrating improved weight shifting L/R and foot clearance BLE. Reviewed home modifications & increased safety with mobility following d/c. At end of session pt left in recliner with BLE elevated, all needs within reach & RN present.  Therapy Documentation Precautions:  Precautions Precautions: Fall Precaution Comments: R groin abscess, port Restrictions Weight Bearing Restrictions: No   See Function Navigator for Current Functional Status.   Therapy/Group: Individual Therapy  Waunita Schooner 03/04/2016, 2:51 PM

## 2016-03-04 NOTE — Progress Notes (Addendum)
Physical Therapy Discharge Summary  Patient Details  Name: Brandy Hardy MRN: [id number] Date of Birth: Nov 20, 1955    Patient has met 9 of 9 long term goals due to improved activity tolerance, improved balance, improved postural control, increased strength and ability to compensate for deficits.  Patient to discharge at an ambulatory level Modified Independent.   Patient's care partner is independent to provide the necessary physical assistance at discharge.  Reasons goals not met: n/a  Recommendation:  Patient will benefit from ongoing skilled PT services in home health setting to continue to advance safe functional mobility, address ongoing impairments in endurance, strength, balance, and minimize fall risk.  Equipment: bariatric RW  Reasons for discharge: treatment goals met and discharge from hospital  Patient/family agrees with progress made and goals achieved: Yes  PT Discharge Precautions/Restrictions Precautions Precaution Comments: R groin abscess, port Restrictions Weight Bearing Restrictions: No  Pain Pain Assessment Pain Assessment: No/denies pain   Cognition Overall Cognitive Status: Within Functional Limits for tasks assessed Arousal/Alertness: Awake/alert Sensation Sensation Light Touch: Appears Intact Proprioception: Appears Intact Coordination Gross Motor Movements are Fluid and Coordinated: Yes Motor  Motor Motor: Within Functional Limits   Trunk/Postural Assessment  Cervical Assessment Cervical Assessment: Within Functional Limits Thoracic Assessment Thoracic Assessment:  (flexed) Lumbar Assessment Lumbar Assessment:  (posterior pelvic tilt) Postural Control Postural Control: Within Functional Limits  Balance Balance Balance Assessed: Yes Static Sitting Balance Static Sitting - Level of Assistance: 7: Independent Dynamic Sitting Balance Sitting balance - Comments: requires UE support  Dynamic Standing Balance Dynamic Standing -  Comments: mod I with RW Extremity Assessment      RLE Strength RLE Overall Strength Comments: hip flex 2+/5, knee ext 3-/5, ankle DF/PF 4/5 LLE Assessment LLE Assessment: Within Functional Limits   See Function Navigator for Current Functional Status.  DONAWERTH,KAREN 03/04/2016, 5:24 PM   Lavone Nian, PT, DPT

## 2016-03-04 NOTE — Progress Notes (Signed)
Social Work Patient ID: Brandy Hardy, female   DOB: 08-Oct-1955, 60 y.o.   MRN: [id number]   Met with pt and husband today and reviewed d/c arrangements in place.  Both report feeling ready for d/c tomorrow.  No further concerns.  Sharry Beining, LCSW

## 2016-03-04 NOTE — Patient Care Conference (Signed)
Inpatient RehabilitationTeam Conference and Plan of Care Update Date: 03/04/2016   Time: 2:30 pm    Patient Name: Brandy Hardy      Medical Record Number: [id number]  Date of Birth: 09-20-1955 Sex: Female         Room/Bed: 4M07C/4M07C-01 Payor Info: Payor: Monia Pouch / Plan: Derrek Gu / Product Type: *No Product type* /    Admitting Diagnosis: debility  Admit Date/Time:  02/22/2016  6:07 PM Admission Comments: No comment available   Primary Diagnosis:  Debility Principal Problem: Debility  Patient Active Problem List   Diagnosis Date Noted  . Dysuria 03/05/2016  . Acute lower UTI   . Debility 02/22/2016  . Abscess of right groin 02/20/2016  . Fever   . History of cervical cancer   . Acute blood loss anemia   . Anemia of chronic disease   . Tachypnea   . Lymphocytosis   . Abscess of groin, right   . Super obese (HCC)   . Sepsis (HCC)   . Septic shock (HCC) 02/14/2016  . UTI (urinary tract infection)   . Obesity, morbid (HCC) 02/21/2013  . Essential hypertension 11/23/2012  . Type II or unspecified type diabetes mellitus without mention of complication, not stated as uncontrolled 11/23/2012  . Other and unspecified hyperlipidemia 11/23/2012    Expected Discharge Date: Expected Discharge Date: 03/05/16  Team Members Present: Physician leading conference: Dr. Faith Rogue Social Worker Present: Amada Jupiter, LCSW Nurse Present: Ronny Bacon, RN PT Present: Aleda Grana, PT;Other (comment) Judieth Keens, PT) OT Present: Roney Mans, OT SLP Present: Feliberto Gottron, SLP PPS Coordinator present : Tora Duck, RN, CRRN     Current Status/Progress Goal Weekly Team Focus  Medical   foley replaced due to incontinence and wound soiling, wound now healing. getting stronger  finalize medical issues for discharge  se above   Bowel/Bladder   foley catheter inserted 11/26 due to soiling of dressing every time patient voids. Continent of bowel, LBM 11/26.  Remain  continent of bowel. Assess for foley catheter need.  Monitor for bowel function and need for foley catheter.   Swallow/Nutrition/ Hydration             ADL's   bathing and dressing with setup and min to mod A for toileting   supervision overall; min A toileting  family education, activity tolerance and safety with mobility, toileting and management of wounds and catheter during functional tasks   Mobility   supervision overall (transfers, ambulation with RW, car transfer with steps & single rail), increasing activity tolerance  supervision/min assist overall  pt/family education, d/c planning, bed mobility, car transfers, ambulation, stair training   Communication             Safety/Cognition/ Behavioral Observations            Pain   6/10 pain during dressing change.   pain less than 4. Pre-medicate for dressing change.  Monitor for pain and medicate prn.   Skin   2 right groin incision that is open, BID dressing changes wet to dry.   No new skin breakdown while in rehab.  Monitor skin, incision for signs and symptoms of infection.    Rehab Goals Patient on target to meet rehab goals: Yes *See Care Plan and progress notes for long and short-term goals.  Barriers to Discharge: incontinence    Possible Resolutions to Barriers:  foley replaced for now until wound healed---may need outpt urology follow up    Discharge Planning/Teaching Needs:  Plan home with spouse, daughter and sister who can cover very close to 24/7 supervision.  Husband has completed teaching with therapies and nursing.   Team Discussion:  Pt on target for d/c tomorrow with [facility] and PT follow up  Revisions to Treatment Plan:  None   Continued Need for Acute Rehabilitation Level of Care: The patient requires daily medical management by a physician with specialized training in physical medicine and rehabilitation for the following conditions: Daily direction of a multidisciplinary physical rehabilitation  program to ensure safe treatment while eliciting the highest outcome that is of practical value to the patient.: Yes Daily medical management of patient stability for increased activity during participation in an intensive rehabilitation regime.: Yes Daily analysis of laboratory values and/or radiology reports with any subsequent need for medication adjustment of medical intervention for : Post surgical problems;Urological problems;Wound care problems  Raisa Ditto 03/06/2016, 3:10 PM

## 2016-03-04 NOTE — Progress Notes (Signed)
Occupational Therapy Discharge Summary  Patient Details  Name: Brandy Hardy MRN: [id number] Date of Birth: Jun 11, 1955  Today's Date: 03/04/2016 OT Individual Time: 1100-1200 OT Individual Time Calculation (min): 60 min   GRAD DAY: Focus on self care retraining at shower level with husband present for family education. Pt does require A to undress wound before shower. Pt able to bathe mod I once in shower. Discussed safe way to transfer into shower / stepping over threshold backwards with Rw to seat. Used long handle sponge for LB bathing. Pt donned dress and socks. Discussed possible setup for wound supplies to ease care giving.     Patient has met 7 of 7 long term goals due to improved activity tolerance, improved balance, postural control and ability to compensate for deficits.  Patient to discharge at overall Modified Independent and supervision level.  Patient's care partner is independent to provide the necessary physical and wound care assistance at discharge.    Reasons goals not met:   Recommendation:  Patient will benefit from ongoing skilled OT services in home health setting to continue to advance functional skills in the area of BADL and Reduce care partner burden.  Equipment: RW  Reasons for discharge: treatment goals met and discharge from hospital  Patient/family agrees with progress made and goals achieved: Yes  OT Discharge Precautions/Restrictions Precautions Precautions: Fall Precaution Comments: R groin abscess, port Restrictions Weight Bearing Restrictions: No   Vital Signs Therapy Vitals Temp: 98.7 F (37.1 C) Temp Source: Oral Pulse Rate: 99 Resp: 18 BP: (!) 124/51 Patient Position (if appropriate): Sitting Oxygen Therapy SpO2: 99 % O2 Device: Not Delivered Pain  no c/o ADL ADL ADL Comments: see functional navigator Vision/Perception  Vision- History Baseline Vision/History: No visual deficits Patient Visual Report: No change from  baseline Vision- Assessment Vision Assessment?: No apparent visual deficits  Cognition Overall Cognitive Status: Within Functional Limits for tasks assessed Arousal/Alertness: Awake/alert Orientation Level: Oriented X4 Focused Attention: Appears intact Memory: Appears intact Awareness: Appears intact Problem Solving: Appears intact Safety/Judgment: Appears intact Sensation Sensation Light Touch: Appears Intact Stereognosis: Appears Intact Hot/Cold: Appears Intact Proprioception: Appears Intact Coordination Gross Motor Movements are Fluid and Coordinated: Yes Fine Motor Movements are Fluid and Coordinated: Yes Finger Nose Finger Test: Intact Motor  Motor Motor - Discharge Observations: improved overall strength Mobility  Transfers Transfers: Stand to Sit;Sit to Stand Sit to Stand: 5: Supervision Stand to Sit: 5: Supervision  Trunk/Postural Assessment  Cervical Assessment Cervical Assessment: Within Functional Limits Thoracic Assessment Thoracic Assessment:  (flexed) Lumbar Assessment Lumbar Assessment:  (posterior pelvic tilt) Postural Control Postural Control: Within Functional Limits  Balance Static Sitting Balance Static Sitting - Balance Support: Left upper extremity supported;Feet supported Static Sitting - Level of Assistance: 7: Independent Dynamic Sitting Balance Sitting balance - Comments: mod I  Dynamic Standing Balance Dynamic Standing - Comments: mod I  Extremity/Trunk Assessment RUE Assessment RUE Assessment: Within Functional Limits LUE Assessment LUE Assessment: Within Functional Limits   See Function Navigator for Current Functional Status.  Willeen Cass [facility] 03/04/2016, 3:57 PM

## 2016-03-04 NOTE — Progress Notes (Signed)
[place] PHYSICAL MEDICINE & REHABILITATION     PROGRESS NOTE    Subjective/Complaints: Another good night. Feels stronger. Asked about FMLA paperwork.  ROS: Pt denies fever, rash/itching, headache, blurred or double vision, nausea, vomiting, abdominal pain, diarrhea, chest pain, shortness of breath, palpitations, dysuria, dizziness, neck pain, back pain, bleeding, anxiety, or depression  Objective: Vital Signs: Blood pressure (!) 100/40, pulse 83, temperature 98.5 F (36.9 C), temperature source Oral, resp. rate 18, height 5\' 3"  (1.6 m), weight 135.6 kg (298 lb 15.1 oz), SpO2 95 %. No results found.  Recent Labs  03/04/16 0448  WBC 6.6  HGB 8.5*  HCT 26.1*  PLT 443*   No results for input(s): NA, K, CL, GLUCOSE, BUN, CREATININE, CALCIUM in the last 72 hours.  Invalid input(s): CO CBG (last 3)  No results for input(s): GLUCAP in the last 72 hours.  Wt Readings from Last 3 Encounters:  02/22/16 135.6 kg (298 lb 15.1 oz)  02/22/16 135 kg (297 lb 9.6 oz)  10/08/15 122.5 kg (270 lb)    Physical Exam:  Constitutional: obese HENT: NCAT Eyes: EOMI  Cardiovascular: RRR Respiratory:clear.  GI: BS+, NT  Musculoskeletal: Normal range of motion. She exhibits ongoing peripheral edema.  Neurological: cognitively intct.  Motor: UE 5/5 B/l LE: 4+/5 prox to distal unchanged Skin: Skin is warmand dry.  Groin incision with an abundance of granulation tissue. Area filling in nicely.  Psychiatric: normal affect. Normal behavior  Assessment/Plan: 1. Functional and mobility deficits  secondary to sepsis/abscessed wounds which require 3+ hours per day of interdisciplinary therapy in a comprehensive inpatient rehab setting. Physiatrist is providing close team supervision and 24 hour management of active medical problems listed below. Physiatrist and rehab team continue to assess barriers to discharge/monitor patient progress toward functional and medical  goals.  Function:  Bathing Bathing position   Position: Sitting EOB  Bathing parts Body parts bathed by patient: Right arm, Left arm, Chest, Abdomen, Right upper leg, Left upper leg, Front perineal area Body parts bathed by helper: Buttocks, Right lower leg, Left lower leg, Back  Bathing assist Assist Level:  (mod A)      Upper Body Dressing/Undressing Upper body dressing   What is the patient wearing?: Pull over shirt/dress     Pull over shirt/dress - Perfomed by patient: Thread/unthread right sleeve, Thread/unthread left sleeve, Put head through opening, Pull shirt over trunk   Button up shirt - Perfomed by patient: Thread/unthread right sleeve, Thread/unthread left sleeve, Pull shirt around back, Button/unbutton shirt      Upper body assist Assist Level: Supervision or verbal cues, Set up   Set up : To obtain clothing/put away  Lower Body Dressing/Undressing Lower body dressing   What is the patient wearing?: Non-skid slipper socks         Non-skid slipper socks- Performed by patient: Don/doff right sock, Don/doff left sock Non-skid slipper socks- Performed by helper: Don/doff right sock, Don/doff left sock                  Lower body assist Assist for lower body dressing:  (Max A)      Toileting Toileting Toileting activity did not occur: Safety/medical concerns Toileting steps completed by patient: Adjust clothing prior to toileting, Performs perineal hygiene, Adjust clothing after toileting Toileting steps completed by helper: Performs perineal hygiene Toileting Assistive Devices: Grab bar or rail  Toileting assist Assist level: Supervision or verbal cues   Transfers Chair/bed transfer   Chair/bed transfer method: Ambulatory  Chair/bed transfer assist level: Supervision or verbal cues Chair/bed transfer assistive device: Medical sales representative Ambulation activity did not occur: Safety/medical concerns (per report)   Max distance: 62  ft Assist level: Supervision or verbal cues   Wheelchair Wheelchair activity did not occur: Safety/medical concerns (per report) Type: Manual Max wheelchair distance: 150 ft Assist Level: Supervision or verbal cues  Cognition Comprehension Comprehension assist level: Follows complex conversation/direction with no assist  Expression Expression assist level: Expresses complex ideas: With no assist  Social Interaction Social Interaction assist level: Interacts appropriately with others - No medications needed.  Problem Solving Problem solving assist level: Solves complex problems: Recognizes & self-corrects  Memory Memory assist level: Complete Independence: No helper   Medical Problem List and Plan: 1. Functional and mobility deficitssecondary to inguinal/thigh abscess and sepsis with subsequent debility  -team conference today   -dc tomorrow 2. DVT Prophylaxis/Anticoagulation: Lovenox 3. Pain Management: Premedicating with oxycodone prior to dressing changes   4. Mood: Motivated to put best effort and get home. LCSW to follow for evaluation and support.  5. Neuropsych: This patient iscapable of making decisions on herown behalf. 6. Skin/Wound Care: wet-dry dressing bid--can decrease if drainage less  -area improving!  -family ed 7. Fluids/Electrolytes/Nutrition: good PO intake 8. Necrotizing fasciitis with right groin abscess s/p I and D: To continue wet to dry dressing changed to right mons/thigh wound. Daily showers recommended.   -Completed IV Zosyn/Diflucan course 9. Morbid obesity: Encourage appropriate diet and weight loss. Also discussed increasing activity level as patient sedentary. She reports that she has lost 50 lbs in the past 2 years and off BP/DM meds.    -continue with improved dietary efforts  10. Reactive leucocytosis: Resolved  WBCs 6.0 11/24 11. Stress induced hyperglycemia: Hgb A1c- 5.6 (10/2015). continue CM restrictions to diet.  12. Critical illness  anemia:   Hb 8.2 11/24--stable  No obvious blood loss at present 13. Periurethral ulcers with chronic cystitis/frequency/retention: .    - continue foley, replaced 11/26 due to location of wound, frequent urination, soiled dressing, poor sleep, etc  -  Various different options tried and discussed with nursing.    UA+, UCx Neg      LOS (Days) 11 A FACE TO FACE EVALUATION WAS PERFORMED  Dhalia Zingaro T 03/04/2016 8:29 AM

## 2016-03-04 NOTE — Progress Notes (Signed)
Husband taught to change dressings to groin and thigh. Successfully changed both dressing with verbal instructions from Probation officer. Then he verbalized the procedure step by step.

## 2016-03-05 ENCOUNTER — Telehealth: Payer: Self-pay

## 2016-03-05 DIAGNOSIS — R300 Dysuria: Secondary | ICD-10-CM

## 2016-03-05 LAB — URINALYSIS, ROUTINE W REFLEX MICROSCOPIC
BILIRUBIN URINE: NEGATIVE
GLUCOSE, UA: NEGATIVE mg/dL
Ketones, ur: NEGATIVE mg/dL
Nitrite: POSITIVE — AB
PH: 8 (ref 5.0–8.0)
Protein, ur: 30 mg/dL — AB
SPECIFIC GRAVITY, URINE: 1.011 (ref 1.005–1.030)

## 2016-03-05 LAB — URINE MICROSCOPIC-ADD ON

## 2016-03-05 MED ORDER — ASCORBIC ACID 500 MG PO TABS
500.0000 mg | ORAL_TABLET | Freq: Two times a day (BID) | ORAL | 1 refills | Status: DC
Start: 2016-03-05 — End: 2017-07-20

## 2016-03-05 MED ORDER — CEPHALEXIN 250 MG PO CAPS: 250.0000 mg | ORAL_CAPSULE | Freq: Three times a day (TID) | ORAL | 0 refills | Status: DC

## 2016-03-05 MED ORDER — ADULT MULTIVITAMIN W/MINERALS CH: 1.0000 | ORAL_TABLET | Freq: Every day | ORAL | 0 refills | Status: AC

## 2016-03-05 MED ORDER — OXYCODONE HCL 5 MG PO TABS: 5.0000 mg | ORAL_TABLET | Freq: Four times a day (QID) | ORAL | 0 refills | Status: DC | PRN

## 2016-03-05 MED ORDER — CEPHALEXIN 250 MG PO CAPS: 250.0000 mg | ORAL_CAPSULE | Freq: Three times a day (TID) | ORAL | Status: DC

## 2016-03-05 MED ORDER — HEPARIN SOD (PORK) LOCK FLUSH 100 UNIT/ML IV SOLN
500.0000 [IU] | INTRAVENOUS | Status: AC | PRN
  Administered 2016-03-05: 500 [IU]

## 2016-03-05 NOTE — Progress Notes (Signed)
[place] PHYSICAL MEDICINE & REHABILITATION     PROGRESS NOTE    Subjective/Complaints: Home today. Had a good night.  Objective: Vital Signs: Blood pressure (!) 105/50, pulse 78, temperature 98.3 F (36.8 C), temperature source Oral, resp. rate 18, height 5\' 3"  (1.6 m), weight 135.6 kg (298 lb 15.1 oz), SpO2 98 %. No results found.  Recent Labs  03/04/16 0448  WBC 6.6  HGB 8.5*  HCT 26.1*  PLT 443*   No results for input(s): NA, K, CL, GLUCOSE, BUN, CREATININE, CALCIUM in the last 72 hours.  Invalid input(s): CO CBG (last 3)  No results for input(s): GLUCAP in the last 72 hours.  Wt Readings from Last 3 Encounters:  02/22/16 135.6 kg (298 lb 15.1 oz)  02/22/16 135 kg (297 lb 9.6 oz)  10/08/15 122.5 kg (270 lb)    Physical Exam:  Constitutional: obese HENT: NCAT Eyes: EOMI  Cardiovascular: RRR Respiratory:clear.  GI: BS+, NT  Musculoskeletal: 1+ edema Neurological: cognitively intct.  Motor: UE 5/5 B/l LE: 4+/5 prox to distal unchanged Skin: Skin is warmand dry.  Groin incision closing in nicely with pink granulation Psychiatric: normal affect. Normal behavior  Assessment/Plan: 1. Functional and mobility deficits  secondary to sepsis/abscessed wounds which require 3+ hours per day of interdisciplinary therapy in a comprehensive inpatient rehab setting. Physiatrist is providing close team supervision and 24 hour management of active medical problems listed below. Physiatrist and rehab team continue to assess barriers to discharge/monitor patient progress toward functional and medical goals.  Function:  Bathing Bathing position   Position: Shower  Bathing parts Body parts bathed by patient: Right arm, Left arm, Chest, Abdomen, Right upper leg, Left upper leg, Front perineal area, Buttocks, Right lower leg, Left lower leg Body parts bathed by helper: Back  Bathing assist Assist Level: Set up      Upper Body Dressing/Undressing Upper body dressing    What is the patient wearing?: Pull over shirt/dress (dress)     Pull over shirt/dress - Perfomed by patient: Thread/unthread right sleeve, Thread/unthread left sleeve, Put head through opening, Pull shirt over trunk   Button up shirt - Perfomed by patient: Thread/unthread right sleeve, Thread/unthread left sleeve, Pull shirt around back, Button/unbutton shirt      Upper body assist Assist Level: Supervision or verbal cues, Set up   Set up : To obtain clothing/put away  Lower Body Dressing/Undressing Lower body dressing   What is the patient wearing?: Non-skid slipper socks         Non-skid slipper socks- Performed by patient: Don/doff right sock, Don/doff left sock Non-skid slipper socks- Performed by helper: Don/doff right sock, Don/doff left sock                  Lower body assist Assist for lower body dressing: Supervision or verbal cues      Toileting Toileting Toileting activity did not occur: Safety/medical concerns Toileting steps completed by patient: Adjust clothing prior to toileting, Performs perineal hygiene, Adjust clothing after toileting Toileting steps completed by helper: Performs perineal hygiene Toileting Assistive Devices: Grab bar or rail  Toileting assist Assist level: More than reasonable time   Transfers Chair/bed transfer   Chair/bed transfer method: Ambulatory Chair/bed transfer assist level: No Help, no cues, assistive device, takes more than a reasonable amount of time Chair/bed transfer assistive device: Medical sales representative Ambulation activity did not occur: Safety/medical concerns (per report)   Max distance: 120 ft Assist level: No help,  No cues, assistive device, takes more than a reasonable amount of time   Wheelchair Wheelchair activity did not occur: Safety/medical concerns (per report) Type: Manual Max wheelchair distance: 150 ft Assist Level: Supervision or verbal cues  Cognition Comprehension Comprehension  assist level: Follows complex conversation/direction with no assist  Expression Expression assist level: Expresses complex ideas: With no assist  Social Interaction Social Interaction assist level: Interacts appropriately with others - No medications needed.  Problem Solving Problem solving assist level: Solves complex problems: Recognizes & self-corrects  Memory Memory assist level: Complete Independence: No helper   Medical Problem List and Plan: 1. Functional and mobility deficitssecondary to inguinal/thigh abscess and sepsis with subsequent debility  -home today  -HHRN, therapies  -Patient to see me in the office for transitional care encounter in 1-2 weeks. 2. DVT Prophylaxis/Anticoagulation: Lovenox--can dc 3. Pain Management: Premedicating with oxycodone prior to dressing changes   4. Mood: Motivated to put best effort and get home. LCSW to follow for evaluation and support.  5. Neuropsych: This patient iscapable of making decisions on herown behalf. 6. Skin/Wound Care: wet-dry dressing bid--can decrease if drainage less  -probalbly a few weeks away from closing 7. Fluids/Electrolytes/Nutrition: good PO intake 8. Necrotizing fasciitis with right groin abscess s/p I and D: To continue wet to dry dressing changed to right mons/thigh wound. Daily showers recommended.   -Completed IV Zosyn/Diflucan course 9. Morbid obesity: Encourage appropriate diet and weight loss. Also discussed increasing activity level as patient sedentary. She reports that she has lost 50 lbs in the past 2 years and off BP/DM meds.    -continue with improved dietary efforts  10. Reactive leucocytosis: Resolved  WBCs 6.0 11/24 11. Stress induced hyperglycemia: Hgb A1c- 5.6 (10/2015). continue CM restrictions to diet.  12. Critical illness anemia:   Hb 8.2 11/24--stable  No obvious blood loss at present 13. Periurethral ulcers with chronic cystitis/frequency/retention: .    - continue foley upon  discharge  -will discuss plan once home---would probably wait until wound hasclosed before removing     LOS (Days) 12 A FACE TO FACE EVALUATION WAS PERFORMED  SWARTZ,ZACHARY T 03/05/2016 8:27 AM

## 2016-03-05 NOTE — Telephone Encounter (Signed)
Patient has called an regards to her FMLA papers. Patient stated "questions #1,4,5 has to be filled out correctly and fully with dates of her admissions and also the date of her surgery". Patient also states "her first day she called in sick was 02/08/16". Please advise.

## 2016-03-05 NOTE — Progress Notes (Signed)
Patient and family discussed all the discharged information with Reesa Chew ,PA.

## 2016-03-05 NOTE — Discharge Instructions (Signed)
Inpatient Rehab Discharge Instructions  Brandy Hardy Discharge date and time:  03/05/16  Activities/Precautions/ Functional Status: Activity: activity as tolerated Diet: cardiac diet and diabetic diet Wound Care: Pack track in right groin with iodoform dressing. Pack wound with damp to dry dressing--change twice a day--more frequent if soiled.    Functional status:  ___ No restrictions     ___ Walk up steps independently ___ 24/7 supervision/assistance   ___ Walk up steps with assistance _X__ Intermittent supervision/assistance  ___ Bathe/dress independently ___ Walk with walker     _X__ Bathe/dress with assistance ___ Walk Independently    ___ Shower independently ___ Walk with assistance    ___ Shower with assistance _X__ No alcohol     ___ Return to work/school ________    COMMUNITY REFERRALS UPON DISCHARGE:    Home Health:   PT    RN                     Agency:  [facility] Phone: [phone]   Medical Equipment/Items Ordered:  Rolling walker                                                      Agency/Supplier:  [employer] @ [phone]   Special Instructions: 1. Foley care twice a day. Follow up with General surgery for input on foley removal as wound heals.    Foley Catheter Care, Adult A Foley catheter is a soft, flexible tube. This tube is placed into your bladder to drain pee (urine). If you go home with this catheter in place, follow the instructions below. TAKING CARE OF THE CATHETER 1. Wash your hands with soap and water. 2. Put soap and water on a clean washcloth.  Clean the skin where the tube goes into your body.  Clean away from the tube site.  Never wipe toward the tube.  Clean the area using a circular motion.  Remove all the soap. Pat the area dry with a clean towel. For males, reposition the skin that covers the end of the penis (foreskin). 3. Attach the tube to your leg with tape or a leg strap. Do not stretch the tube tight. If  you are using tape, remove any stickiness left behind by past tape you used. 4. Keep the drainage bag below your hips. Keep it off the floor. 5. Check your tube during the day. Make sure it is working and draining. Make sure the tube does not curl, twist, or bend. 6. Do not pull on the tube or try to take it out. TAKING CARE OF THE DRAINAGE BAGS You will have a large overnight drainage bag and a small leg bag. You may wear the overnight bag any time. Never wear the small bag at night. Follow the directions below. Emptying the Drainage Bag  Empty your drainage bag when it is ?- full or at least 2-3 times a day. 1. Wash your hands with soap and water. 2. Keep the drainage bag below your hips. 3. Hold the dirty bag over the toilet or clean container. 4. Open the pour spout at the bottom of the bag. Empty the pee into the toilet or container. Do not let the pour spout touch anything. 5. Clean the pour spout with a gauze pad or cotton ball that has rubbing alcohol  on it. 6. Close the pour spout. 7. Attach the bag to your leg with tape or a leg strap. 8. Wash your hands well. Changing the Drainage Bag  Change your bag once a month or sooner if it starts to smell or look dirty.  1. Wash your hands with soap and water. 2. Pinch the rubber tube so that pee does not spill out. 3. Disconnect the catheter tube from the drainage tube at the connection valve. Do not let the tubes touch anything. 4. Clean the end of the catheter tube with an alcohol wipe. Clean the end of a the drainage tube with a different alcohol wipe. 5. Connect the catheter tube to the drainage tube of the clean drainage bag. 6. Attach the new bag to the leg with tape or a leg strap. Avoid attaching the new bag too tightly. 7. Wash your hands well. Cleaning the Drainage Bag  1. Wash your hands with soap and water. 2. Wash the bag in warm, soapy water. 3. Rinse the bag with warm water. 4. Fill the bag with a mixture of white  vinegar and water (1 cup vinegar to 1 quart warm water [.2 liter vinegar to 1 liter warm water]). Close the bag and soak it for 30 minutes in the solution. 5. Rinse the bag with warm water. 6. Hang the bag to dry with the pour spout open and hanging downward. 7. Store the clean bag (once it is dry) in a clean plastic bag. 8. Wash your hands well. PREVENT INFECTION  Wash your hands before and after touching your tube.  Take showers every day. Wash the skin where the tube enters your body. Do not take baths. Replace wet leg straps with dry ones, if this applies.  Do not use powders, sprays, or lotions on the genital area. Only use creams, lotions, or ointments as told by your doctor.  For females, wipe from front to back after going to the bathroom.  Drink enough fluids to keep your pee clear or pale yellow unless you are told not to have too much fluid (fluid restriction).  Do not let the drainage bag or tubing touch or lie on the floor.  Wear cotton underwear to keep the area dry. GET HELP IF:  Your pee is cloudy or smells unusually bad.  Your tube becomes clogged.  You are not draining pee into the bag or your bladder feels full.  Your tube starts to leak. GET HELP RIGHT AWAY IF:  You have pain, puffiness (swelling), redness, or yellowish-white fluid (pus) where the tube enters the body.  You have pain in the belly (abdomen), legs, lower back, or bladder.  You have a fever.  You see blood fill the tube, or your pee is pink or red.  You feel sick to your stomach (nauseous), throw up (vomit), or have chills.  Your tube gets pulled out. MAKE SURE YOU:   Understand these instructions.  Will watch your condition.  Will get help right away if you are not doing well or get worse. This information is not intended to replace advice given to you by your health care provider. Make sure you discuss any questions you have with your health care provider. Document Released:  07/19/2012 Document Revised: 04/14/2014 Document Reviewed: 03/10/2015 Elsevier Interactive Patient Education  2017 [employer].   My questions have been answered and I understand these instructions. I will adhere to these goals and the provided educational materials after my discharge from the hospital.  Patient/Caregiver Signature _______________________________ Date __________  Clinician Signature _______________________________________ Date __________  Please bring this form and your medication list with you to all your follow-up doctor's appointments.    MIDLINE WOUND CARE: - midline dressing to be changed twice daily - supplies: sterile saline, kerlix, iodoform, scissors, ABD pads, tape  - remove dressing and all packing carefully, moistening with sterile saline as needed to avoid packing/internal dressing sticking to the wound. - clean edges of skin around the wound with water/gauze, making sure there is no tape debris or leakage left on skin that could cause skin irritation or breakdown. - insert iodoform into deep tunneling wound and be sure to leave at least a 5 inch tail. Make sure to remove at each wound change. - dampen and clean kerlix with sterile saline and pack wound from wound base to skin level, making sure to take note of any possible areas of wound tracking, tunneling and packing appropriately. Wound can be packed loosely. Trim kerlix to size if a whole kerlix is not required. - cover wound with a dry ABD pad and secure with tape.  - write the date/time on the dry dressing/tape to better track when the last dressing change occurred. - apply any skin protectant/powder recommended by clinician to protect skin/skin folds. - change dressing as needed if leakage occurs, wound gets contaminated, or patient requests to shower. - patient may shower daily with wound open and following the shower the wound should be dried and a clean dressing placed.

## 2016-03-05 NOTE — Progress Notes (Signed)
  Subjective: Pt is doing well without complaints. Going home today  Objective: Vital signs in last 24 hours: Temp:  [98.3 F (36.8 C)-98.7 F (37.1 C)] 98.3 F (36.8 C) (11/29 0601) Pulse Rate:  [78-99] 78 (11/29 0601) Resp:  [18] 18 (11/29 0601) BP: (105-124)/(50-51) 105/50 (11/29 0601) SpO2:  [98 %-99 %] 98 % (11/29 0601) Last BM Date: 03/05/16  Intake/Output from previous day: 11/28 0701 - 11/29 0700 In: 1040 [P.O.:1020; I.V.:20] Out: 2375 [Urine:2375] Intake/Output this shift: Total I/O In: -  Out: 400 [Urine:400]  PE: General appearance: alert, cooperative, no distress and pleasant Resp: effort normal Skin: wound beds with beefy red granulation tissue, roughly 4-5cm narrow tunneling at central aspect of large wound with purulent drainage. No surrounding erythema or signs of cellulitis.   Lab Results:   Recent Labs  03/04/16 0448  WBC 6.6  HGB 8.5*  HCT 26.1*  PLT 443*    BMET No results for input(s): NA, K, CL, CO2, GLUCOSE, BUN, CREATININE, CALCIUM in the last 72 hours. PT/INR No results for input(s): LABPROT, INR in the last 72 hours.  No results for input(s): AST, ALT, ALKPHOS, BILITOT, PROT, ALBUMIN in the last 168 hours.   Lipase  No results found for: LIPASE   Studies/Results: No results found.  Medications: . atorvastatin  40 mg Oral Daily  . enoxaparin (LOVENOX) injection  40 mg Subcutaneous Q24H  . feeding supplement (ENSURE ENLIVE)  237 mL Oral BID BM  . feeding supplement (PRO-STAT SUGAR FREE 64)  30 mL Oral BID  . fluconazole  200 mg Oral Daily  . lidocaine  1 application Urethral Once  . multivitamin with minerals  1 tablet Oral Daily  . senna  1 tablet Oral BID  . sodium chloride flush  10-40 mL Intracatheter Q12H  . vitamin C  500 mg Oral BID    Assessment/Plan S/P 1.    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 Kieth Brightly 02/16/16) 2.     Debridement of skin subcutaneous tissue and fascia right groin wound Debridement to be monitored skin subcutaneous tissue of right thigh wound (Dr. Dalbert Batman 02/20/16)  Continue dressing changes, packed with iodoform this morning due to increase purulent drainage noted since Monday. The Kerlix was not packed in the tunnel today. I feel the iodoform will be easier for the husband for home dressing changes.  Pt is being d/c today to home She will need to follow up with Dr. Kieth Brightly in 2-3 weeks.   LOS: 12 days    Kalman Drape 03/05/2016 [id number]

## 2016-03-05 NOTE — Progress Notes (Signed)
Social Work  Discharge Note  The overall goal for the admission was met for:   Discharge location: Yes - home with spouse and sister who can provide 24/7 assistance  Length of Stay: Yes - 12 days  Discharge activity level: Yes - supervision/ min assist overall  Home/community participation: Yes  Services provided included: MD, RD, PT, OT, RN, TR, Pharmacy and [place]: Private Insurance: [employer]  Follow-up services arranged: Home Health: RN, PT via [facility], DME: bariatric rolling walker - [facility] and Patient/Family has no preference for HH/DME agencies  Comments (or additional information):  Patient/Family verbalized understanding of follow-up arrangements: Yes  Individual responsible for coordination of the follow-up plan: pt  Confirmed correct DME delivered: Jazen Spraggins 03/05/2016    Iyauna Sing

## 2016-03-05 NOTE — Discharge Summary (Signed)
Physician Discharge Summary  Patient ID: Brandy Hardy MRN: [id number] DOB/AGE: [birth year] 60 y.o.  Admit date: 02/22/2016 Discharge date: 03/05/2016  Discharge Diagnoses:  Principal Problem:   Debility Active Problems:   Essential hypertension   Obesity, morbid (HCC)   Septic shock (HCC)   Acute blood loss anemia   Acute lower UTI   Dysuria   Discharged Condition: stable.   Significant Diagnostic Studies:   Labs:  Basic Metabolic Panel: BMP Latest Ref Rng & Units 02/23/2016 02/20/2016 02/19/2016  Glucose 65 - 99 mg/dL 98 782(N) 562(Z)  BUN 6 - 20 mg/dL <3(Y) 6 7  Creatinine 8.65 - 1.00 mg/dL 7.84 6.96 2.95  Sodium 135 - 145 mmol/L 137 139 138  Potassium 3.5 - 5.1 mmol/L 3.7 3.8 4.4  Chloride 101 - 111 mmol/L 100(L) 104 104  CO2 22 - 32 mmol/L 28 28 27   Calcium 8.9 - 10.3 mg/dL 8.2(L) 7.6(L) 7.3(L)    CBC:  Recent Labs Lab 02/29/16 0500 03/04/16 0448  WBC 6.0 6.6  HGB 8.2* 8.5*  HCT 26.6* 26.1*  MCV 96.4 96.0  PLT 456* 443*    CBG: No results for input(s): GLUCAP in the last 168 hours.  Brief HPI:   Brandy Hardy is a 60 year old female with history of recurrent endometrial cancer s/p hysterectomy and XRT, morbid obesity, recent UTI who was admitted on 02/14/16 with weakness, poor po intake, abdominal pain and near syncope. She was noted to be dehydrated and hypotensive due to septic shock. She was treated with fluid bolus, IV antibiotics and levophed. CT abdomen showed areas of subcutaneous cellulitis with early abscess formation and air with inflammatory changes tracing into bilateral upper thigh musculature. She developed right groin drainage due to abscess. 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 Sheliah Hatch on 11/11. She continued to have copious malodorous drainage from wound and was taken back to OR on 11/15 for repeat I and D and was found to have urinary  retention during placement of foley catheter.   Blood culture X 2 positive for beta lactamase positive Bacteriodes fragilis. Wound culture with mixed flora. To continue bid dressing as will not be able to place VAC and continue IV antibiotics per [facility]. Dr. Drue Second recommends weeks of IV antibiotic regiment with 11/12 as day one--antibiotic narrowed to Zosyn and Flucanozole. Therapy evaluations done showing patient limited by generalized weakness and difficulty mobilizing due to pain and body habitus. CIR recommended for follow up therapy.    Hospital Course: [name] was admitted to rehab 02/22/2016 for inpatient therapies to consist of PT and OT at least three hours five days a week. Past admission physiatrist, therapy team and rehab RN have worked together to provide customized collaborative inpatient rehab. Dressing changes have been done bid basis with premedication prior to wound care. Foley was discontinued for voiding trial but she had frequency requiring multiple dressing changes due to soiling. Ditropan was added without much effect and foley was replaced on 11/26. She has competed 3 weeks regimen of Zosyn and Diflucan.   Right groin wounds were filling in with decrease in slough. Wound has been monitored by [facility] and idoform gauze was added to packing due to reports of increase in purulent drainage. Follow up CBC showed that ABLA and leucocytosis has resolved.  She has been afebrile during her stay. She did report dysuria on the day of discharge and UA done  prior to discharge showed pyuria. She was started on Keflex empirically pending culture.  She has made good progress during her rehab stay and is modified independent at discharge. She will continue to receive follow up HHPT,HHOT and HHRN by [facility] after discharge.    Rehab course: During patient's stay in rehab weekly team conferences were held to monitor patient's progress, set goals and discuss barriers to discharge. At  admission, patient required max assist with basic self care tasks and total assist with mobility. She has had improvement in activity tolerance, balance, postural control, as well as ability to compensate for deficits. She requires assistance with undressing and is able to shower with use of AE at modified independent level. She requires supervision for toileting. She is able to ambulate  120' with RW at modified independent level. Family education was completed with husband regarding safety and wound care.     Disposition: 01-Home or Self Care  Diet: Carb modified diet.  Special Instructions: 1. Shower in am. Pack wounds with damp to dry dressing. Pack wound track with iodoform dressing.  2. No driving or strenuous activity.    Discharge Instructions    Ambulatory referral to Physical Medicine Rehab    Complete by:  As directed    1-2 week follow up       Medication List    STOP taking these medications   fluconazole 200 MG tablet Commonly known as:  DIFLUCAN   methocarbamol 500 MG tablet Commonly known as:  ROBAXIN   piperacillin-tazobactam 3.375 GM/50ML IVPB Commonly known as:  ZOSYN     TAKE these medications   ascorbic acid 500 MG tablet Commonly known as:  VITAMIN C Take 1 tablet (500 mg total) by mouth 2 (two) times daily.   atorvastatin 40 MG tablet Commonly known as:  LIPITOR TAKE 1 TABLET DAILY   cephALEXin 250 MG capsule Commonly known as:  KEFLEX Take 1 capsule (250 mg total) by mouth every 8 (eight) hours.   multivitamin with minerals Tabs tablet Take 1 tablet by mouth daily. Start taking on:  03/06/2016   oxyCODONE 5 MG immediate release tablet--Rx # 28 pills Commonly known as:  Oxy IR/ROXICODONE Take 1 tablet (5 mg total) by mouth every 6 (six) hours as needed for severe pain. What changed:  reasons to take this   PROBIOTIC DAILY PO Take 1 tablet by mouth daily.   senna 8.6 MG Tabs tablet Commonly known as:  SENOKOT Take 1 tablet (8.6 mg  total) by mouth 2 (two) times daily.   Vitamin D (Ergocalciferol) 50000 units Caps capsule Commonly known as:  DRISDOL TAKE 1 CAPSULE WEEKLY      Follow-up Information    Ranelle Oyster, MD Follow up.   Specialty:  Physical Medicine and Rehabilitation Why:  Office will call you with follow up appointment Contact information: [address] [place] [address] [phone]        Rodman Pickle, MD Follow up on 03/19/2016.   Specialty:  General Surgery Why:  10:30AM appointment arrive 30 minutes prior to appointment time Contact information: [address] [place] [address] [phone]        Lupe Carney, MD. Call in 1 day(s).   Specialty:  Family Medicine Why:  for post hospital follow up in 1-2 weeks.  Contact information: [address] [place] [address] [phone]           Signed: Jacquelynn Cree  03/05/2016, 10:05 PM

## 2016-03-06 ENCOUNTER — Telehealth: Payer: Self-pay | Admitting: *Deleted

## 2016-03-06 NOTE — Telephone Encounter (Signed)
Transitional care call contact made with [facility]  1. Are you/is patient experiencing any problems since coming home? Are there any questions regarding any aspect of care?No 2. Are there any questions regarding medications administration/dosing? Are meds being taken as prescribed? Patient should review meds with caller to confirm Has all medications and taking as prescribed 3. Have there been any falls? No 4. Has Home Health been to the house and/or have they contacted you? If not, have you tried to contact them? Can we help you contact them? They are due to come out today 5. Are bowels and bladder emptying properly? Are there any unexpected incontinence issues? If applicable, is patient following bowel/bladder programs? no 6. Any fevers, problems with breathing, unexpected pain? No only takes med before dressing changes 7. Are there any skin problems or new areas of breakdown? No husband performing wound care 8. Has the patient/family member arranged specialty MD follow up (ie cardiology/neurology/renal/surgical/etc)?  Can we help arrange? Appts made and appt given for Dr Naaman Plummer  9. Does the patient need any other services or support that we can help arrange? No 10. Are caregivers following through as expected in assisting the patient? Yes 11. Has the patient quit smoking, drinking alcohol, or using drugs as recommended? N/A  Appointment 03/18/16 @ 10:20 Arrive by 10:00 to see Dr Naaman Plummer [address] , address reviewed

## 2016-03-07 LAB — URINE CULTURE

## 2016-03-09 ENCOUNTER — Encounter (HOSPITAL_COMMUNITY): Payer: Self-pay

## 2016-03-09 ENCOUNTER — Emergency Department (HOSPITAL_COMMUNITY)
Admission: EM | Admit: 2016-03-09 | Discharge: 2016-03-10 | Disposition: A | Discharge status: Home / Self Care | Payer: Managed Care, Other (non HMO) | Attending: Emergency Medicine | Admitting: Emergency Medicine

## 2016-03-09 DIAGNOSIS — E119 Type 2 diabetes mellitus without complications: Secondary | ICD-10-CM | POA: Insufficient documentation

## 2016-03-09 DIAGNOSIS — R319 Hematuria, unspecified: Secondary | ICD-10-CM | POA: Insufficient documentation

## 2016-03-09 DIAGNOSIS — T83098A Other mechanical complication of other indwelling urethral catheter, initial encounter: Secondary | ICD-10-CM | POA: Diagnosis present

## 2016-03-09 DIAGNOSIS — Z8049 Family history of malignant neoplasm of other genital organs: Secondary | ICD-10-CM | POA: Diagnosis not present

## 2016-03-09 DIAGNOSIS — I10 Essential (primary) hypertension: Secondary | ICD-10-CM | POA: Diagnosis not present

## 2016-03-09 DIAGNOSIS — Z8542 Personal history of malignant neoplasm of other parts of uterus: Secondary | ICD-10-CM | POA: Insufficient documentation

## 2016-03-09 DIAGNOSIS — Y829 Unspecified medical devices associated with adverse incidents: Secondary | ICD-10-CM | POA: Diagnosis not present

## 2016-03-09 DIAGNOSIS — E876 Hypokalemia: Secondary | ICD-10-CM | POA: Insufficient documentation

## 2016-03-09 DIAGNOSIS — Z8541 Personal history of malignant neoplasm of cervix uteri: Secondary | ICD-10-CM | POA: Insufficient documentation

## 2016-03-09 DIAGNOSIS — Z79899 Other long term (current) drug therapy: Secondary | ICD-10-CM | POA: Insufficient documentation

## 2016-03-09 DIAGNOSIS — R310 Gross hematuria: Secondary | ICD-10-CM

## 2016-03-09 DIAGNOSIS — D649 Anemia, unspecified: Secondary | ICD-10-CM | POA: Diagnosis not present

## 2016-03-09 LAB — I-STAT CHEM 8, ED
BUN: 14 mg/dL (ref 6–20)
CALCIUM ION: 1.14 mmol/L — AB (ref 1.15–1.40)
Chloride: 100 mmol/L — ABNORMAL LOW (ref 101–111)
Creatinine, Ser: 0.6 mg/dL (ref 0.44–1.00)
GLUCOSE: 104 mg/dL — AB (ref 65–99)
HCT: 28 % — ABNORMAL LOW (ref 36.0–46.0)
HEMOGLOBIN: 9.5 g/dL — AB (ref 12.0–15.0)
POTASSIUM: 3.2 mmol/L — AB (ref 3.5–5.1)
SODIUM: 139 mmol/L (ref 135–145)
TCO2: 28 mmol/L (ref 0–100)

## 2016-03-09 LAB — CBC WITH DIFFERENTIAL/PLATELET
BASOS ABS: 0 10*3/uL (ref 0.0–0.1)
Basophils Relative: 0 %
EOS ABS: 0.1 10*3/uL (ref 0.0–0.7)
EOS PCT: 1 %
HCT: 28.6 % — ABNORMAL LOW (ref 36.0–46.0)
HEMOGLOBIN: 9.1 g/dL — AB (ref 12.0–15.0)
LYMPHS PCT: 18 %
Lymphs Abs: 1.5 10*3/uL (ref 0.7–4.0)
MCH: 30.4 pg (ref 26.0–34.0)
MCHC: 31.8 g/dL (ref 30.0–36.0)
MCV: 95.7 fL (ref 78.0–100.0)
Monocytes Absolute: 0.7 10*3/uL (ref 0.1–1.0)
Monocytes Relative: 9 %
NEUTROS PCT: 72 %
Neutro Abs: 6 10*3/uL (ref 1.7–7.7)
PLATELETS: 314 10*3/uL (ref 150–400)
RBC: 2.99 MIL/uL — AB (ref 3.87–5.11)
RDW: 19 % — ABNORMAL HIGH (ref 11.5–15.5)
WBC: 8.4 10*3/uL (ref 4.0–10.5)

## 2016-03-09 LAB — URINALYSIS, ROUTINE W REFLEX MICROSCOPIC
Bilirubin Urine: NEGATIVE
Glucose, UA: NEGATIVE mg/dL
KETONES UR: NEGATIVE mg/dL
Nitrite: NEGATIVE
PROTEIN: 100 mg/dL — AB
Specific Gravity, Urine: 1.015 (ref 1.005–1.030)
pH: 6 (ref 5.0–8.0)

## 2016-03-09 LAB — URINE MICROSCOPIC-ADD ON

## 2016-03-09 MED ORDER — POTASSIUM CHLORIDE CRYS ER 20 MEQ PO TBCR
40.0000 meq | EXTENDED_RELEASE_TABLET | Freq: Once | ORAL | Status: AC
  Administered 2016-03-10: 40 meq via ORAL
  Filled 2016-03-09: qty 2

## 2016-03-09 MED ORDER — OXYCODONE HCL 5 MG PO TABS
5.0000 mg | ORAL_TABLET | Freq: Once | ORAL | Status: AC
  Administered 2016-03-09: 5 mg via ORAL
  Filled 2016-03-09: qty 1

## 2016-03-09 NOTE — ED Provider Notes (Addendum)
[facility] DEPT Provider Note   CSN: [id number] Arrival date & time: 03/09/16  1730     History   Chief Complaint Chief Complaint  Patient presents with  . foley catheter problem    HPI Brandy Hardy is a 60 y.o. female.He began leaking around her fully catheter today. She reports that her home health nurse was unable to fully the balloon on her existing Foley catheter. Her husband reports that she's had blood in Foley catheter bag since 03/07/2016. Patient feels well otherwise. No treatment prior to coming here. She is currently under treatment for urinary tract infection with Keflex. Patient requires indwelling Foley catheter as she has open wounds in suprapubic area. She denies lightheadedness denies pain anywhere and presently feels well.  HPI  Past Medical History:  Diagnosis Date  . Cervical adenocarcinoma ([place])    stage II  . Endometrial cancer ([place])    with recurrance at introitus   . History of prediabetes   . 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 Dysuria 03/05/2016  . Acute lower UTI   . Debility 02/22/2016  . Abscess of right groin 02/20/2016  . Fever   . History of cervical cancer   . Acute blood loss anemia   . Anemia of chronic disease   . Tachypnea   . Lymphocytosis   . Abscess of groin, right   . Super obese ([place])   . Sepsis ([place])   . Septic shock ([place]) 02/14/2016  . UTI (urinary tract infection)   . Obesity, morbid ([place]) 02/21/2013  . Essential hypertension 11/23/2012  . Type II or unspecified type diabetes mellitus without mention of complication, not stated as uncontrolled 11/23/2012  . Other and unspecified hyperlipidemia 11/23/2012    Past Surgical History:  Procedure Laterality Date  . ABDOMINAL HYSTERECTOMY    . I&D EXTREMITY Right 02/20/2016   Procedure: IRRIGATION AND DEBRIDEMENT EXTREMITY RIGHT UPPER THIGH;  Surgeon: Fanny Skates, MD;  Location: [facility];  Service: General;  Laterality: Right;  . IRRIGATION AND DEBRIDEMENT ABSCESS Right 02/16/2016   Procedure: IRRIGATION AND DEBRIDEMENT RIGHT GROIN ABSCESS, FULL THICKNESS DEBRIDMENT RIGHT GROIN INCLUDING > 40CM SQUARE.;  Surgeon: Arta Bruce Kinsinger, MD;  Location: [facility];  Service: General;  Laterality: Right;  . IRRIGATION AND DEBRIDEMENT ABSCESS Right 02/20/2016   Procedure: IRRIGATION AND DEBRIDEMENT ABSCESS;  Surgeon: Fanny Skates, MD;  Location: [facility];  Service: General;  Laterality: Right;  Perii-Vaginal Abscess    OB History    No data available       Home Medications    Prior to Admission medications   Medication Sig Start Date End Date Taking? Authorizing Provider  atorvastatin (LIPITOR) 40 MG tablet TAKE 1 TABLET DAILY 02/19/16   Elayne Snare, MD  cephALEXin (KEFLEX) 250 MG capsule Take 1 capsule (250 mg total) by mouth every 8 (eight) hours. 03/05/16   Bary Leriche, PA-C  Multiple Vitamin (MULTIVITAMIN WITH MINERALS) TABS tablet Take 1 tablet by mouth daily. 03/06/16   Bary Leriche, PA-C  oxyCODONE (OXY IR/ROXICODONE) 5 MG immediate release tablet Take 1 tablet (5 mg total) by mouth every 6 (six) hours as needed for severe pain. 03/05/16   Bary Leriche, PA-C  Probiotic Product (PROBIOTIC DAILY PO) Take 1 tablet by mouth daily.     Historical Provider, MD  senna (SENOKOT) 8.6 MG TABS tablet Take 1 tablet (8.6 mg  total) by mouth 2 (two) times daily. 02/22/16   Theodis Blaze, MD  vitamin C (VITAMIN C) 500 MG tablet Take 1 tablet (500 mg total) by mouth 2 (two) times daily. 03/05/16   Bary Leriche, PA-C  Vitamin D, Ergocalciferol, (DRISDOL) 50000 units CAPS capsule TAKE 1 CAPSULE WEEKLY 02/19/16   Elayne Snare, MD    Family History Family History  Problem Relation Age of Onset  . Hypertension Mother   . Heart disease Father 66    Expired  . Diabetes Neg Hx     Social History Social History  Substance Use Topics  . Smoking status: Never Smoker  . Smokeless tobacco:  Never Used  . Alcohol use No     Allergies   Patient has no known allergies.   Review of Systems Review of Systems  Constitutional: Negative.   HENT: Negative.   Respiratory: Negative.   Cardiovascular: Negative.   Gastrointestinal: Negative.   Genitourinary: Positive for hematuria.       Indwelling Foley catheter. Hematuria  Musculoskeletal: Positive for gait problem.       Wheelchair-bound  Skin: Positive for wound.       Wound at suprapubic area  Psychiatric/Behavioral: Negative.      Physical Exam Updated Vital Signs BP 135/67 (BP Location: Right Arm)   Pulse 67   Temp 98.3 F (36.8 C) (Oral)   Resp 14   Ht 5\' 3"  (1.6 m)   Wt 268 lb (121.6 kg)   SpO2 100%   BMI 47.47 kg/m   Physical Exam  Constitutional: She appears well-developed and well-nourished.  HENT:  Head: Normocephalic and atraumatic.  Eyes: Conjunctivae are normal. Pupils are equal, round, and reactive to light.  Neck: Neck supple. No tracheal deviation present. No thyromegaly present.  Cardiovascular: Normal rate and regular rhythm.   No murmur heard. Pulmonary/Chest: Effort normal and breath sounds normal.  Abdominal: Soft. Bowel sounds are normal. She exhibits no distension. There is no tenderness.  Morbidly obese  Genitourinary:  Genitourinary Comments: Open wound at left inguinal crease and at suprapubic area which appear clean. External genitalia normal. There is a white creamy vaginal discharge Foley catheter in place.  Musculoskeletal: Normal range of motion. She exhibits no edema or tenderness.  Neurological: She is alert. Coordination normal.  Skin: Skin is warm and dry. No rash noted.  Psychiatric: She has a normal mood and affect.  Nursing note and vitals reviewed.  Balloon to Foley catheter was easily deflated by me using a syringe and Foley catheter was pulled out by me without resistance and with ease. Is a 43 [place] Foley. I suggested 27 French Foley catheter be placed by nurse  as she had a clot at the tip of the Foley catheter probably causing obstruction  ED Treatments / Results  Labs (all labs ordered are listed, but only abnormal results are displayed) Labs Reviewed  URINE CULTURE  URINALYSIS, ROUTINE W REFLEX MICROSCOPIC (NOT AT [facility])  CBC WITH DIFFERENTIAL/PLATELET  I-STAT CHEM 8, ED    EKG  EKG Interpretation None       Radiology No results found.  Procedures Procedures (including critical care time)  Medications Ordered in ED Medications - No data to display   Initial Impression / Assessment and Plan / ED Course  I have reviewed the triage vital signs and the nursing notes.  Pertinent labs & imaging results that were available during my care of the patient were reviewed by me and considered in my medical  decision making (see chart for details). A 20 French Foley catheter with 10 mL balloon was inserted by the nurse with ease.. Patient requested oxycodone prior to Foley insertion. Ordered by me  Clinical Course   Received oral potassium 40 mEq by mouth.  Anemia is chronic. Plan referral [facility] Results for orders placed or performed during the hospital encounter of 03/09/16  Urinalysis, Routine w reflex microscopic (not at [facility])  Result Value Ref Range   Color, Urine AMBER (A) YELLOW   APPearance CLOUDY (A) CLEAR   Specific Gravity, Urine 1.015 1.005 - 1.030   pH 6.0 5.0 - 8.0   Glucose, UA NEGATIVE NEGATIVE mg/dL   Hgb urine dipstick LARGE (A) NEGATIVE   Bilirubin Urine NEGATIVE NEGATIVE   Ketones, ur NEGATIVE NEGATIVE mg/dL   Protein, ur 100 (A) NEGATIVE mg/dL   Nitrite NEGATIVE NEGATIVE   Leukocytes, UA TRACE (A) NEGATIVE  CBC with Differential/Platelet  Result Value Ref Range   WBC 8.4 4.0 - 10.5 K/uL   RBC 2.99 (L) 3.87 - 5.11 MIL/uL   Hemoglobin 9.1 (L) 12.0 - 15.0 g/dL   HCT 28.6 (L) 36.0 - 46.0 %   MCV 95.7 78.0 - 100.0 fL   MCH 30.4 26.0 - 34.0 pg   MCHC 31.8 30.0 - 36.0 g/dL   RDW 19.0 (H) 11.5 - 15.5 %     Platelets 314 150 - 400 K/uL   Neutrophils Relative % 72 %   Neutro Abs 6.0 1.7 - 7.7 K/uL   Lymphocytes Relative 18 %   Lymphs Abs 1.5 0.7 - 4.0 K/uL   Monocytes Relative 9 %   Monocytes Absolute 0.7 0.1 - 1.0 K/uL   Eosinophils Relative 1 %   Eosinophils Absolute 0.1 0.0 - 0.7 K/uL   Basophils Relative 0 %   Basophils Absolute 0.0 0.0 - 0.1 K/uL  Urine microscopic-add on  Result Value Ref Range   Squamous Epithelial / LPF 0-5 (A) NONE SEEN   WBC, UA 0-5 0 - 5 WBC/hpf   RBC / HPF TOO NUMEROUS TO COUNT 0 - 5 RBC/hpf   Bacteria, UA FEW (A) NONE SEEN   Urine-Other MUCOUS PRESENT   I-stat chem 8, ed  Result Value Ref Range   Sodium 139 135 - 145 mmol/L   Potassium 3.2 (L) 3.5 - 5.1 mmol/L   Chloride 100 (L) 101 - 111 mmol/L   BUN 14 6 - 20 mg/dL   Creatinine, Ser 0.60 0.44 - 1.00 mg/dL   Glucose, Bld 104 (H) 65 - 99 mg/dL   Calcium, Ion 1.14 (L) 1.15 - 1.40 mmol/L   TCO2 28 0 - 100 mmol/L   Hemoglobin 9.5 (L) 12.0 - 15.0 g/dL   HCT 28.0 (L) 36.0 - 46.0 %   Dg Chest 1 View  Result Date: 02/14/2016 CLINICAL DATA:  Urinary tract infection. EXAM: CHEST 1 VIEW COMPARISON:  Chest radiograph 02/21/2009 FINDINGS: Right chest wall Port-A-Cath tip overlies the proximal right atrium. There is shallow lung inflation and cardiomegaly. No pneumothorax or sizable pleural effusion. No focal airspace consolidation or pulmonary edema. IMPRESSION: 1. Port-A-Cath tip overlying the proximal right atrium. 2. Cardiomegaly and shallow lung inflation without focal airspace disease. Electronically Signed   By: Ulyses Jarred M.D.   On: 02/14/2016 03:40   Ct Renal Stone Study  Result Date: 02/14/2016 CLINICAL DATA:  Lower abdominal pain for several weeks with history of UTI EXAM: CT ABDOMEN AND PELVIS WITHOUT CONTRAST TECHNIQUE: Multidetector CT imaging of the abdomen and pelvis  was performed following the standard protocol without IV contrast. COMPARISON:  04/21/2007 FINDINGS: Lower chest: No acute  abnormality. Hepatobiliary: The liver is within normal limits. The gallbladder is well distended with a few small dependent gallstones. Pancreas: Within normal limits. Spleen: Within normal limits. Adrenals/Urinary Tract: The adrenal glands are unremarkable. The left kidney demonstrates no renal calculi. The ureter is well visualized and a tiny calcific density is noted adjacent to the distal left ureter best seen on image number 80 of series 2. This appears to represent a nonobstructing distal ureteral stone. The right kidney demonstrates mild fullness of the collecting system although no definitive calculus is seen. The bladder is partially distended. Stomach/Bowel: Diverticular change is seen without diverticulosis. The appendix is within normal limits. Vascular/Lymphatic: Aortic atherosclerosis. No enlarged abdominal or pelvic lymph nodes. Reproductive: Status post hysterectomy. No adnexal masses. Other: In the anterior pelvic wall just below the pubic symphysis and to the right of the midline there is a mottled collection of air within the subcutaneous fat with surrounding inflammatory changes. This extends into the adjacent pelvic musculature bilaterally slightly greater on the right than the left. Additionally this air tracks superiorly into the pubic symphysis. No definitive fluid collection is identified. The air also tracks into the low pelvis on the right adjacent to the urinary bladder. Musculoskeletal: Degenerative changes of the lumbar spine are noted. No other focal abnormality is seen. IMPRESSION: Changes in the low pelvis anteriorly as described. This likely represents an area of subcutaneous cellulitis with early abscess formation. No definitive fluid collection is noted. The air in inflammatory change tracks into musculature of the upper thigh bilaterally as well as towards the pubic symphysis and into the right hemipelvis adjacent to the bladder. These changes may be related to the patient's  recent UTI. Distal left ureteral stone without significant obstructive change. Mild fullness of the right collecting system without definitive calculus. No evidence of osteomyelitis is noted. Electronically Signed   By: Inez Catalina M.D.   On: 02/14/2016 12:26   Final Clinical Impressions(s) / ED Diagnoses  Diagnosis #1 hematuria #2 anemia #3 hypokalemia Final diagnoses:  None    New Prescriptions New Prescriptions   No medications on file     Orlie Dakin, MD 03/09/16 2358    Orlie Dakin, MD 03/09/16 [place], MD 03/10/16 0010

## 2016-03-09 NOTE — ED Triage Notes (Signed)
Patient here with foley catheter that is not draining into bag as normal. States that she is urinating around the tube. Has foley due to open abdominal wounds. States home health nurse came to her home and were unable to change out the foley, denies pain

## 2016-03-10 ENCOUNTER — Telehealth: Payer: Self-pay

## 2016-03-10 MED ORDER — HEPARIN SOD (PORK) LOCK FLUSH 100 UNIT/ML IV SOLN
500.0000 [IU] | Freq: Once | INTRAVENOUS | Status: AC
  Administered 2016-03-10: 500 [IU]
  Filled 2016-03-10: qty 5

## 2016-03-10 NOTE — Telephone Encounter (Signed)
Patient discharged from hospital, noted a UTI, stated patient has bladder cath in place and saw blood in urine, otherwise healthy. No fever

## 2016-03-10 NOTE — ED Notes (Signed)
Pt verbalized understanding of d/c instructions and has no further questions. Pt is stable, A&Ox4, VSS.  

## 2016-03-10 NOTE — Discharge Instructions (Signed)
Call [facility] office tomorrow to schedule next available appointment. Tell office staff that you were seen here. You'll need guidance as to when Foley catheter can come out. The urologist will likely need to coordinate that with your other doctors. Return if concern for any reason

## 2016-03-10 NOTE — Telephone Encounter (Signed)
[facility] called asking for PT orders for 1 wk 1, 2 wk 2 aproval given

## 2016-03-11 LAB — URINE CULTURE: Culture: NO GROWTH

## 2016-03-18 ENCOUNTER — Encounter: Payer: Self-pay | Admitting: Physical Medicine & Rehabilitation

## 2016-03-18 ENCOUNTER — Encounter
Payer: Managed Care, Other (non HMO) | Attending: Physical Medicine & Rehabilitation | Admitting: Physical Medicine & Rehabilitation

## 2016-03-18 VITALS — BP 107/72 | HR 96 | Resp 14

## 2016-03-18 DIAGNOSIS — L02214 Cutaneous abscess of groin: Secondary | ICD-10-CM

## 2016-03-18 DIAGNOSIS — R310 Gross hematuria: Secondary | ICD-10-CM

## 2016-03-18 DIAGNOSIS — R5381 Other malaise: Secondary | ICD-10-CM | POA: Diagnosis present

## 2016-03-18 MED ORDER — OXYCODONE HCL 5 MG PO TABS: 5.0000 mg | ORAL_TABLET | Freq: Four times a day (QID) | ORAL | 0 refills | Status: DC | PRN

## 2016-03-18 NOTE — Patient Instructions (Signed)
PLEASE CALL ME WITH ANY PROBLEMS OR QUESTIONS ([phone])   HAPPY HOLIDAYS!!!!                    *                * *             *   *   *         *  *   *  *  *     *  *  *  *  *  *  * *  *  *  *  *  *  *  *  *  * *               *  *               *  *               *  *  

## 2016-03-18 NOTE — Progress Notes (Signed)
Subjective:    Patient ID: Brandy Hardy, female    DOB: 04/14/1955, 60 y.o.   MRN: [id number]      Transitional Care Visit   HPI  Brandy Hardy is here in follow up of her rehab stay. She was in the ED on 12/3 with leakage around her foley as well as hematuria.  A larger foley #20 was placed and a urology referral was made. She has a cystoscopy scheduled with [facility] on Thursday. She is still experiencing blood in her urine.   Husband states that her wound is healing to a large extent. There is still a "tunnel" which the husband is concerned about. He is changing the wound 2-3 x daily. The area is tender to touch. She is emptying her bowels without issues.  She denies shortness of breath or cough. Her mood has been positive. Her husband is supportive.   She has follow up with surgery in the morning.      Pain Inventory Average Pain 2 Pain Right Now 0 My pain is burning  In the last 24 hours, has pain interfered with the following? General activity 1 Relation with others 0 Enjoyment of life 0 What TIME of day is your pain at its worst? night Sleep (in general) Good  Pain is worse with: walking Pain improves with: rest Relief from Meds: 9  Mobility use a walker how many minutes can you walk? 15 ability to climb steps?  yes do you drive?  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  yes  Neuro/Psych bladder control problems  Prior Studies Any changes since last visit?  no  Physicians involved in your care Any changes since last visit?  no   Family History  Problem Relation Age of Onset  . Hypertension Mother   . Heart disease Father 49    Expired  . Diabetes Neg Hx    Social History   Social History  . Marital status: Married    Spouse name: N/A  . Number of children: N/A  . Years of  education: N/A   Social History Main Topics  . Smoking status: Never Smoker  . Smokeless tobacco: Never Used  . Alcohol use No  . Drug use: No  . Sexual activity: Not Asked   Other Topics Concern  . None   Social History Narrative  . None   Past Surgical History:  Procedure Laterality Date  . ABDOMINAL HYSTERECTOMY    . I&D EXTREMITY Right 02/20/2016   Procedure: IRRIGATION AND DEBRIDEMENT EXTREMITY RIGHT UPPER THIGH;  Surgeon: Claud Kelp, MD;  [facility];  Service: General;  Laterality: Right;  . IRRIGATION AND DEBRIDEMENT ABSCESS Right 02/16/2016   Procedure: IRRIGATION AND DEBRIDEMENT RIGHT GROIN ABSCESS, FULL THICKNESS DEBRIDMENT RIGHT GROIN INCLUDING > 40CM SQUARE.;  Surgeon: De Blanch Kinsinger, MD;  Location[facility];  Service: General;  Laterality: Right;  . IRRIGATION AND DEBRIDEMENT ABSCESS Right 02/20/2016   Procedure: IRRIGATION AND DEBRIDEMENT ABSCESS;  Surgeon: Claud Kelp, MD;  [facility];  Service: General;  Laterality: Right;  Perii-Vaginal Abscess   Past Medical History:  Diagnosis Date  . Cervical adenocarcinoma ([facility])    stage II  . Endometrial cancer ([facility])    with recurrance at introitus   . History of prediabetes   . Recurrent vaginal cancer ([facility])  chemo 2015 and XRT 2016   BP 107/72   Pulse 96   Resp 14   SpO2 94%   Opioid Risk Score:   Fall Risk Score:  `1  Depression screen PHQ 2/9  Depression screen PHQ 2/9 03/18/2016  Decreased Interest 0  Down, Depressed, Hopeless 0  PHQ - 2 Score 0  Altered sleeping 0  Tired, decreased energy 1  Change in appetite 0  Feeling bad or failure about yourself  1  Trouble concentrating 0  Moving slowly or fidgety/restless 0  Suicidal thoughts 0  PHQ-9 Score 2    Review of Systems  Constitutional: Negative.   HENT: Negative.   Eyes: Negative.   Respiratory: Negative.   Cardiovascular: Negative.   Gastrointestinal: Negative.   Endocrine: Negative.   Genitourinary: Negative.     Musculoskeletal: Negative.   Skin: Negative.   Allergic/Immunologic: Negative.   Hematological: Negative.   Psychiatric/Behavioral: Negative.   All other systems reviewed and are negative.      Objective:   Physical Exam  Constitutional: obese HENT: NCAT Eyes: PERRL  Cardiovascular: RRR Respiratory:clear bilaterally GI: BS+, NT  Musculoskeletal: 1+ edema in both legs Neurological: CN intact.  Motor: UE 5/5 B/l LE: 4+/5 prox to distal. Has difficulty transferring from sit to stand due to pain/size Skin: Skin is warmand dry. No erythema or signs of fungal infection Groin incision clean closing as well as thigh incision. Some hypergranulation tissue now on thigh. Some tunneling still of groin incision but tissue very clean with mild fibronecrotic debris. Area remains very tender to manipulation Psychiatric: normal affect. Normal behavior Urology: tea colored urine in foley bag.       Assessment & Plan:  1. Functional and mobility deficitssecondary to inguinal/thigh abscess and sepsis with subsequent debility           -continue with HH therapies 2. Pain Management: Premedicating with oxycodone prior to dressing changes and activities    -I gave her an additional #20 tabs today 3. Skin/Wound Care: wet-dry dressing bid--to TID  -wound is closing nicely  -surgery follow up as planned  -probably will need silver nitrate to thigh area  -gave her an rx for 1/2" iodoform gauze 4. Necrotizing fasciitis with right groin abscess s/p I and D: To continue wet to dry dressing changed to right mons/thigh wound. Daily showers recommended.             5. 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             - continue foley for now       Overall making steady progress. Thirty minutes of face to face patient care time were spent during this visit. All questions were encouraged and answered. Follow up in 6 weeks  time.

## 2016-03-19 ENCOUNTER — Ambulatory Visit (INDEPENDENT_AMBULATORY_CARE_PROVIDER_SITE_OTHER): Payer: Managed Care, Other (non HMO) | Admitting: Internal Medicine

## 2016-03-19 ENCOUNTER — Emergency Department (HOSPITAL_COMMUNITY)
Admission: EM | Admit: 2016-03-19 | Discharge: 2016-03-19 | Disposition: A | Discharge status: Home / Self Care | Payer: Managed Care, Other (non HMO) | Attending: Emergency Medicine | Admitting: Emergency Medicine

## 2016-03-19 ENCOUNTER — Encounter (HOSPITAL_COMMUNITY): Payer: Self-pay | Admitting: Emergency Medicine

## 2016-03-19 ENCOUNTER — Encounter: Payer: Self-pay | Admitting: Internal Medicine

## 2016-03-19 DIAGNOSIS — L02214 Cutaneous abscess of groin: Secondary | ICD-10-CM | POA: Diagnosis not present

## 2016-03-19 DIAGNOSIS — Z8544 Personal history of malignant neoplasm of other female genital organs: Secondary | ICD-10-CM | POA: Diagnosis not present

## 2016-03-19 DIAGNOSIS — E119 Type 2 diabetes mellitus without complications: Secondary | ICD-10-CM | POA: Insufficient documentation

## 2016-03-19 DIAGNOSIS — Z8541 Personal history of malignant neoplasm of cervix uteri: Secondary | ICD-10-CM | POA: Insufficient documentation

## 2016-03-19 DIAGNOSIS — Z8542 Personal history of malignant neoplasm of other parts of uterus: Secondary | ICD-10-CM | POA: Insufficient documentation

## 2016-03-19 DIAGNOSIS — T83098A Other mechanical complication of other indwelling urethral catheter, initial encounter: Secondary | ICD-10-CM | POA: Diagnosis present

## 2016-03-19 DIAGNOSIS — R319 Hematuria, unspecified: Secondary | ICD-10-CM

## 2016-03-19 DIAGNOSIS — T839XXA Unspecified complication of genitourinary prosthetic device, implant and graft, initial encounter: Secondary | ICD-10-CM

## 2016-03-19 DIAGNOSIS — I10 Essential (primary) hypertension: Secondary | ICD-10-CM | POA: Insufficient documentation

## 2016-03-19 DIAGNOSIS — Y738 Miscellaneous gastroenterology and urology devices associated with adverse incidents, not elsewhere classified: Secondary | ICD-10-CM | POA: Insufficient documentation

## 2016-03-19 HISTORY — DX: Disorder of kidney and ureter, unspecified: N28.9

## 2016-03-19 LAB — BASIC METABOLIC PANEL
Anion gap: 10 (ref 5–15)
BUN: 8 mg/dL (ref 6–20)
CO2: 26 mmol/L (ref 22–32)
CREATININE: 0.52 mg/dL (ref 0.44–1.00)
Calcium: 8.4 mg/dL — ABNORMAL LOW (ref 8.9–10.3)
Chloride: 100 mmol/L — ABNORMAL LOW (ref 101–111)
GFR calc Af Amer: >60 mL/min (ref >60)
GFR calc non Af Amer: >60 mL/min (ref >60)
GLUCOSE: 114 mg/dL — AB (ref 65–99)
POTASSIUM: 3 mmol/L — AB (ref 3.5–5.1)
SODIUM: 136 mmol/L (ref 135–145)

## 2016-03-19 LAB — CBC WITH DIFFERENTIAL/PLATELET
Basophils Absolute: 0 10*3/uL (ref 0.0–0.1)
Basophils Relative: 0 %
EOS PCT: 1 %
Eosinophils Absolute: 0.1 10*3/uL (ref 0.0–0.7)
HEMATOCRIT: 29.8 % — AB (ref 36.0–46.0)
Hemoglobin: 10 g/dL — ABNORMAL LOW (ref 12.0–15.0)
LYMPHS ABS: 1.1 10*3/uL (ref 0.7–4.0)
LYMPHS PCT: 10 %
MCH: 30.6 pg (ref 26.0–34.0)
MCHC: 33.6 g/dL (ref 30.0–36.0)
MCV: 91.1 fL (ref 78.0–100.0)
MONO ABS: 1.1 10*3/uL — AB (ref 0.1–1.0)
Monocytes Relative: 10 %
NEUTROS ABS: 8.1 10*3/uL — AB (ref 1.7–7.7)
Neutrophils Relative %: 79 %
PLATELETS: 394 10*3/uL (ref 150–400)
RBC: 3.27 MIL/uL — AB (ref 3.87–5.11)
RDW: 17.6 % — AB (ref 11.5–15.5)
WBC: 10.4 10*3/uL (ref 4.0–10.5)

## 2016-03-19 MED ORDER — OXYCODONE HCL 5 MG PO TABS
5.0000 mg | ORAL_TABLET | Freq: Once | ORAL | Status: AC
  Administered 2016-03-19: 5 mg via ORAL
  Filled 2016-03-19: qty 1

## 2016-03-19 NOTE — ED Triage Notes (Signed)
Per pt, states foley cath clogged-states she has been having same symptoms on and off for weeks-states increased clots today-now unable to empty bladder-urinary retention and hematuria

## 2016-03-19 NOTE — ED Notes (Signed)
Wound dsg changed d/t soiled old dsg.  Purulent drainage noted with odor.

## 2016-03-19 NOTE — Assessment & Plan Note (Signed)
Her polymicrobial right groin abscess, necrotizing fasciitis and Bacteroides bacteremia have been cured. I will have her continue off antibiotics. She can follow-up here as needed.

## 2016-03-19 NOTE — Progress Notes (Signed)
[facility]  Patient Active Problem List   Diagnosis Date Noted  . Abscess of right groin 02/20/2016    Priority: High  . Dysuria 03/05/2016  . Acute lower UTI   . Debility 02/22/2016  . Fever   . History of cervical cancer   . Acute blood loss anemia   . Anemia of chronic disease   . Tachypnea   . Lymphocytosis   . Super obese ([place])   . Sepsis ([place])   . Septic shock ([place]) 02/14/2016  . UTI (urinary tract infection)   . Obesity, morbid ([place]) 02/21/2013  . Essential hypertension 11/23/2012  . Type II or unspecified type diabetes mellitus without mention of complication, not stated as uncontrolled 11/23/2012  . Other and unspecified hyperlipidemia 11/23/2012    Patient's Medications  New Prescriptions   No medications on file  Previous Medications   ATORVASTATIN (LIPITOR) 40 MG TABLET    TAKE 1 TABLET DAILY   MULTIPLE VITAMIN (MULTIVITAMIN WITH MINERALS) TABS TABLET    Take 1 tablet by mouth daily.   OXYCODONE (OXY IR/ROXICODONE) 5 MG IMMEDIATE RELEASE TABLET    Take 1 tablet (5 mg total) by mouth every 6 (six) hours as needed for severe pain.   POLYETHYLENE GLYCOL (MIRALAX / GLYCOLAX) PACKET    Take 17 g by mouth daily.   PROBIOTIC PRODUCT (PROBIOTIC DAILY PO)    Take 1 tablet by mouth daily.    VITAMIN C (VITAMIN C) 500 MG TABLET    Take 1 tablet (500 mg total) by mouth 2 (two) times daily.   VITAMIN D, ERGOCALCIFEROL, (DRISDOL) 50000 UNITS CAPS CAPSULE    TAKE 1 CAPSULE WEEKLY  Modified Medications   No medications on file  Discontinued Medications   CEPHALEXIN (KEFLEX) 250 MG CAPSULE    Take 1 capsule (250 mg total) by mouth every 8 (eight) hours.   SENNA (SENOKOT) 8.6 MG TABS TABLET    Take 1 tablet (8.6 mg total) by mouth 2 (two) times daily.    Subjective: Ms. Achter is in for her hospital follow-up visit. She developed a right groin abscess complicated by necrotizing fasciitis. Her admission blood cultures grew Bacteroides  fragilis. She underwent incision and drainage on 02/16/2016 and again on 02/20/2016. Operative cultures grew mixed anaerobes. She developed urinary retention and required Foley catheter placement. She was seen by my partner, Dr. Carlyle Basques, who recommended 3 weeks of piperacillin tazobactam and fluconazole. She completed that and was discharged on 03/05/2016. She had an abnormal urinalysis on the day of discharge and she was sent out on oral cephalexin. Her urine culture grew Klebsiella sensitive to cephalexin. She has completed the course of antibiotic therapy. Her wound is healing slowly but well. She saw her general surgeon, Dr. Kieth Brightly this morning. His note indicated that the right groin incision with healthy granulation tissue and a 5 cm small deep aspect of the wound. Right thigh incision well healed with granulation tissue at skin level."   Review of Systems: Review of Systems  Constitutional: Negative for chills, diaphoresis and fever.  Gastrointestinal: Negative for abdominal pain, diarrhea, nausea and vomiting.  Skin: Negative for rash.    Past Medical History:  Diagnosis Date  . Cervical adenocarcinoma ([place])    stage II  . Endometrial cancer ([place])    with recurrance at introitus   . History of prediabetes   . 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 Smoking status: Never Smoker  . Smokeless tobacco: Never Used  . Alcohol use No    Family History  Problem Relation Age of Onset  . Hypertension Mother   . Heart disease Father 8    Expired  . Diabetes Neg Hx     No Known Allergies  Objective: Vitals:   03/19/16 1409  BP: 121/78  Pulse: 89  Temp: 98.6 F (37 C)  TempSrc: Oral   There is no height or weight on file to calculate BMI.  Physical Exam  Constitutional: She is oriented to person, place, and time.  She is very pleasant and in no distress. She is seated in her wheelchair. She is  accompanied by her husband.  Abdominal: Soft. There is no tenderness.  Her wounds are pink and granulating without signs of infection.  Neurological: She is alert and oriented to person, place, and time.  Skin: No rash noted.  Psychiatric: Mood and affect normal.    Lab Results    Problem List Items Addressed This Visit      High   Abscess of right groin    Her polymicrobial right groin abscess, necrotizing fasciitis and Bacteroides bacteremia have been cured. I will have her continue off antibiotics. She can follow-up here as needed.          Michel Bickers, MD [facility] [phone] pager   [phone] cell 03/19/2016, 2:46 PM

## 2016-03-19 NOTE — Discharge Instructions (Signed)
Please follow-up with urologist as scheduled tomorrow. Return without fail for worsening symptoms including fever, worsening abdominal pain, intractable vomiting, or any other symptoms concerning to you.

## 2016-03-19 NOTE — ED Provider Notes (Signed)
[facility] DEPT Provider Note   CSN: [id number] Arrival date & time: 03/19/16  1819     History   Chief Complaint Chief Complaint  Patient presents with  . Hematuria  . foley cath clogged    HPI Brandy Hardy is a 60 y.o. female.  HPI 60 year old female who presents with foley catheter problem.  History of endometrial cancer s/p hysterectomy and XRT. Admitted to hospital in 02/2016 for groin cellulitis and abscess requiring OR debridement x 2. Finished antibiotics. Has retention after surgery and with chronic wounds that requires foley cathter. Seen in ED 03/09/2016 for hematuria and clogged catheter. Catether was exchanged for 62F, and she saw Dr. Junious Silk from urology the next 2 days for outpatient irrigation of foley. Yesterday with hematuria and clot through foley and sensation of full bladder. No fever or chills, nausea or vomiting or significant abdominal pain. Has urology appointment tomorrow. Saw surgeon today, who felt that her groin wounds healing appropriately.   Past Medical History:  Diagnosis Date  . Cervical adenocarcinoma ([place])    stage II  . Endometrial cancer ([place])    with recurrance at introitus   . History of prediabetes   . 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 Renal disorder     Patient Active Problem List   Diagnosis Date Noted  . Dysuria 03/05/2016  . Acute lower UTI   . Debility 02/22/2016  . Abscess of right groin 02/20/2016  . Fever   . History of cervical cancer   . Acute blood loss anemia   . Anemia of chronic disease   . Tachypnea   . Lymphocytosis   . Super obese ([place])   . Sepsis ([place])   . Septic shock ([place]) 02/14/2016  . UTI (urinary tract infection)   . Obesity, morbid ([place]) 02/21/2013  . Essential hypertension 11/23/2012  . Type II or unspecified type diabetes mellitus without mention of complication, not stated as uncontrolled 11/23/2012  . Other and unspecified hyperlipidemia 11/23/2012    Past  Surgical History:  Procedure Laterality Date  . ABDOMINAL HYSTERECTOMY    . I&D EXTREMITY Right 02/20/2016   Procedure: IRRIGATION AND DEBRIDEMENT EXTREMITY RIGHT UPPER THIGH;  Surgeon: Fanny Skates, MD;  Location: [facility];  Service: General;  Laterality: Right;  . IRRIGATION AND DEBRIDEMENT ABSCESS Right 02/16/2016   Procedure: IRRIGATION AND DEBRIDEMENT RIGHT GROIN ABSCESS, FULL THICKNESS DEBRIDMENT RIGHT GROIN INCLUDING > 40CM SQUARE.;  Surgeon: Arta Bruce Kinsinger, MD;  Location: [facility];  Service: General;  Laterality: Right;  . IRRIGATION AND DEBRIDEMENT ABSCESS Right 02/20/2016   Procedure: IRRIGATION AND DEBRIDEMENT ABSCESS;  Surgeon: Fanny Skates, MD;  Location: [facility];  Service: General;  Laterality: Right;  Perii-Vaginal Abscess    OB History    No data available       Home Medications    Prior to Admission medications   Medication Sig Start Date End Date Taking? Authorizing Provider  atorvastatin (LIPITOR) 40 MG tablet TAKE 1 TABLET DAILY 02/19/16  Yes Elayne Snare, MD  lidocaine (XYLOCAINE) 2 % jelly Apply 1 application topically as needed (menopause).  01/25/16  Yes Historical Provider, MD  Multiple Vitamin (MULTIVITAMIN WITH MINERALS) TABS tablet Take 1 tablet by mouth daily. 03/06/16  Yes Ivan Anchors Love, PA-C  oxyCODONE (OXY IR/ROXICODONE) 5 MG immediate release tablet Take 1 tablet (5 mg total) by mouth every 6 (six) hours as needed for severe pain. 03/18/16  Yes Meredith Staggers, MD  polyethylene glycol (MIRALAX / GLYCOLAX) packet Take 17 g by mouth daily.   Yes Historical Provider, MD  Probiotic Product (PROBIOTIC DAILY PO) Take 1 tablet by mouth daily.    Yes Historical Provider, MD  vitamin C (VITAMIN C) 500 MG tablet Take 1 tablet (500 mg total) by mouth 2 (two) times daily. 03/05/16  Yes Ivan Anchors Love, PA-C  Vitamin D, Ergocalciferol, (DRISDOL) 50000 units CAPS capsule TAKE 1 CAPSULE WEEKLY Patient taking differently: TAKE 1 CAPSULE WEEKLY ON [facility] 02/19/16  Yes  Elayne Snare, MD  cephALEXin (KEFLEX) 250 MG capsule  03/05/16   Historical Provider, MD    Family History Family History  Problem Relation Age of Onset  . Hypertension Mother   . Heart disease Father 62    Expired  . Diabetes Neg Hx     Social History Social History  Substance Use Topics  . Smoking status: Never Smoker  . Smokeless tobacco: Never Used  . Alcohol use No     Allergies   Patient has no known allergies.   Review of Systems Review of Systems 10/14 systems reviewed and are negative other than those stated in the HPI   Physical Exam Updated Vital Signs BP (!) 119/53   Pulse 90   Temp 99.2 F (37.3 C) (Oral)   Resp 19   SpO2 90%   Physical Exam Physical Exam  Nursing note and vitals reviewed. Constitutional: non-toxic, and in no acute distress Head: Normocephalic and atraumatic.  Mouth/Throat: Oropharynx is clear and moist.  Neck: Normal range of motion. Neck supple.  Cardiovascular: Normal rate and regular rhythm.   Pulmonary/Chest: Effort normal and breath sounds normal.  Abdominal: Soft. There is no tenderness. There is no rebound and no guarding. Chronic open wound in right groin. GU: Foley catheter in place draining  Bloody urine Musculoskeletal: Normal range of motion.  Neurological: Alert, no facial droop, fluent speech, moves all extremities symmetrically Skin: Skin is warm and dry.  Psychiatric: Cooperative   ED Treatments / Results  Labs (all labs ordered are listed, but only abnormal results are displayed) Labs Reviewed  CBC WITH DIFFERENTIAL/PLATELET - Abnormal; Notable for the following:       Result Value   RBC 3.27 (*)    Hemoglobin 10.0 (*)    HCT 29.8 (*)    RDW 17.6 (*)    Neutro Abs 8.1 (*)    Monocytes Absolute 1.1 (*)    All other components within normal limits  BASIC METABOLIC PANEL - Abnormal; Notable for the following:    Potassium 3.0 (*)    Chloride 100 (*)    Glucose, Bld 114 (*)    Calcium 8.4 (*)    All  other components within normal limits  URINE CULTURE    EKG  EKG Interpretation None       Radiology No results found.  Procedures Procedures (including critical care time)  Medications Ordered in ED Medications  oxyCODONE (Oxy IR/ROXICODONE) immediate release tablet 5 mg (5 mg Oral Given 03/19/16 2003)     Initial Impression / Assessment and Plan / ED Course  I have reviewed the triage vital signs and the nursing notes.  Pertinent labs & imaging results that were available during my care of the patient were reviewed by me and considered in my medical decision making (see chart for details).  Clinical Course     Presenting with hematuria via foley catheter with sensation of retention. No significant retention noted on bladder scan, but this  can to error due to the presence of blood in urine. 4F three way foley placed, and irrigation of 2 L of saline performed, with clear urine and no further sensation of retention. Stable anemia. Mild hypokalemia, and discussed increased potassium intake at home. Normal renal function. Unable to obtain UA prior to irrigation. However, patient with urology follow-up tomorrow where she can have urine rechecked if needed. No fever or infectious symptoms to suggest acute infection at this time.   The patient appears reasonably screened and/or stabilized for discharge and I doubt any other medical condition or other [facility] requiring further screening, evaluation, or treatment in the ED at this time prior to discharge.  Strict return and follow-up instructions reviewed. She expressed understanding of all discharge instructions and felt comfortable with the plan of care.   Final Clinical Impressions(s) / ED Diagnoses   Final diagnoses:  Hematuria, unspecified type  Foley catheter problem, initial encounter [facility])    New Prescriptions Discharge Medication List as of 03/19/2016 10:31 PM       Forde Dandy, MD 03/20/16 0145

## 2016-03-19 NOTE — ED Notes (Signed)
CBI 3-way irrigation started.

## 2016-04-14 ENCOUNTER — Ambulatory Visit: Payer: Managed Care, Other (non HMO) | Admitting: Endocrinology

## 2016-04-21 ENCOUNTER — Telehealth: Payer: Self-pay

## 2016-04-21 NOTE — Telephone Encounter (Signed)
Santiago Glad from [facility] called and is requesting orders for prn visits for this patient for foley cath changes.

## 2016-04-22 NOTE — Telephone Encounter (Signed)
That would be fine 

## 2016-04-25 NOTE — Telephone Encounter (Signed)
Message left for mrs geral on her voicemail about dr. Naaman Plummer saying the cath changes will be fine.

## 2016-04-29 ENCOUNTER — Encounter: Payer: Self-pay | Admitting: Physical Medicine & Rehabilitation

## 2016-04-29 ENCOUNTER — Encounter
Payer: Managed Care, Other (non HMO) | Attending: Physical Medicine & Rehabilitation | Admitting: Physical Medicine & Rehabilitation

## 2016-04-29 VITALS — BP 105/69 | HR 83 | Resp 14

## 2016-04-29 DIAGNOSIS — I10 Essential (primary) hypertension: Secondary | ICD-10-CM

## 2016-04-29 DIAGNOSIS — L02214 Cutaneous abscess of groin: Secondary | ICD-10-CM | POA: Diagnosis not present

## 2016-04-29 DIAGNOSIS — R310 Gross hematuria: Secondary | ICD-10-CM

## 2016-04-29 DIAGNOSIS — R300 Dysuria: Secondary | ICD-10-CM

## 2016-04-29 DIAGNOSIS — R5381 Other malaise: Secondary | ICD-10-CM

## 2016-04-29 MED ORDER — OXYCODONE HCL 5 MG PO TABS: 5.0000 mg | ORAL_TABLET | Freq: Four times a day (QID) | ORAL | 0 refills | Status: DC | PRN

## 2016-04-29 NOTE — Patient Instructions (Signed)
MAINTAIN AS MUCH ACTIVITY AS YOU CAN FOR NOW  PLEASE FEEL FREE TO CALL OUR OFFICE WITH ANY PROBLEMS OR QUESTIONS [phone])

## 2016-04-29 NOTE — Progress Notes (Signed)
Subjective:    Patient ID: Brandy Hardy, female    DOB: 03-29-1956, 61 y.o.   MRN: [id number]  HPI   Brandy Hardy is here in follow up of her debility. She was supposed to have her foley removed today but still was having clots in urine, so the PA decided to wait until an MD was present. The Pt is expectedly upset.   Her groin wounds continue to heal. One area is almost closed. Another area still requires packing but is closing.   She is currently dealing with the effects of the Flu (has been in the household).   Pain is present when cath care is required and lesser so with wound care.  She is using every other day to daily at this point.   She is not moving well as the catheter causes pain with ambulation and any type of movement.   Pain Inventory Average Pain 0 Pain Right Now 2 My pain is dull  In the last 24 hours, has pain interfered with the following? General activity 2 Relation with others 0 Enjoyment of life 3 What TIME of day is your pain at its worst? evening Sleep (in general) Fair  Pain is worse with: walking Pain improves with: rest and medication Relief from Meds: 5  Mobility walk with assistance use a walker how many minutes can you walk? 10 ability to climb steps?  yes do you drive?  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  no  Physicians involved in your care Any changes since last visit?  no   Family History  Problem Relation Age of Onset  . Hypertension Mother   . Heart disease Father 49    Expired  . Diabetes Neg Hx    Social History   Social History  . Marital status: Married    Spouse name: N/A  . Number of children: N/A  . Years of education: N/A   Social History Main Topics  . Smoking status: Never Smoker  .  Smokeless tobacco: Never Used  . Alcohol use No  . Drug use: No  . Sexual activity: Not Asked   Other Topics Concern  . None   Social History Narrative  . None   Past Surgical History:  Procedure Laterality Date  . ABDOMINAL HYSTERECTOMY    . I&D EXTREMITY Right 02/20/2016   Procedure: IRRIGATION AND DEBRIDEMENT EXTREMITY RIGHT UPPER THIGH;  Surgeon: Claud Kelp, MD;  Location[facility];  Service: General;  Laterality: Right;  . IRRIGATION AND DEBRIDEMENT ABSCESS Right 02/16/2016   Procedure: IRRIGATION AND DEBRIDEMENT RIGHT GROIN ABSCESS, FULL THICKNESS DEBRIDMENT RIGHT GROIN INCLUDING > 40CM SQUARE.;  Surgeon: De Blanch Kinsinger, MD;  Location: [facility] OR;  Service: General;  Laterality: Right;  . IRRIGATION AND DEBRIDEMENT ABSCESS Right 02/20/2016   Procedure: IRRIGATION AND DEBRIDEMENT ABSCESS;  Surgeon: Claud Kelp, MD;  Location: [facility] OR;  Service: General;  Laterality: Right;  Perii-Vaginal Abscess   Past Medical History:  Diagnosis Date  . Cervical adenocarcinoma ([facility])    stage II  . Endometrial cancer ([facility])    with recurrance at introitus   . History of prediabetes   . 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 Renal disorder    BP 105/69  Pulse 83   Resp 14   SpO2 91%   Opioid Risk Score:   Fall Risk Score:  `1  Depression screen PHQ 2/9  Depression screen [facility] 2/9 03/19/2016 03/18/2016  Decreased Interest 0 0  Down, Depressed, Hopeless 0 0  PHQ - 2 Score 0 0  Altered sleeping - 0  Tired, decreased energy - 1  Change in appetite - 0  Feeling bad or failure about yourself  - 1  Trouble concentrating - 0  Moving slowly or fidgety/restless - 0  Suicidal thoughts - 0  PHQ-9 Score - 2    Review of Systems  Constitutional: Negative.   HENT: Negative.   Eyes: Negative.   Respiratory: Positive for cough.   Cardiovascular: Negative.   Gastrointestinal: Negative.   Endocrine: Negative.   Genitourinary: Positive for difficulty urinating,  genital sores and vaginal pain.  Musculoskeletal: Positive for gait problem.  Skin: Negative.   Allergic/Immunologic: Negative.   Hematological: Negative.   Psychiatric/Behavioral: Negative.   All other systems reviewed and are negative.      Objective:   Physical Exam  Constitutional: obese, has lost weight. Appears sickly today HENT: NCAT Eyes: PERRL  Cardiovascular: RRR Respiratory:clear bilaterally GI: BS+, NT  Musculoskeletal: 1+ edema in both legs Neurological: CN intact.  Motor: UE 5/5 B/l LE: 4+/5 prox to distal. Has difficulty transferring from sit to stand due to pain/size Skin: Skin is warmand dry. No erythema or signs of fungal infection Groin incisions are dressed. Did not visualize today.  Psychiatric: very quiet Urology: urine is cranberry colored..       Assessment & Plan:  1. Functional and mobility deficitssecondary to inguinal/thigh abscess and sepsis with subsequent debility -consider outpt therapies in the future depending on her needs when foley comes out 2. Pain Management: still having pain due to bladder irritation           -I wrote for #30 oxycodone today which should help her get through the urological issues 3. Skin/Wound Care: continue per surgical direction 4. 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    15 minutes of face to face patient care time were spent during this visit. All questions were encouraged and answered. Follow up with me PRN.

## 2016-05-19 ENCOUNTER — Other Ambulatory Visit: Payer: Self-pay | Admitting: Endocrinology

## 2016-05-19 ENCOUNTER — Encounter (HOSPITAL_BASED_OUTPATIENT_CLINIC_OR_DEPARTMENT_OTHER): Payer: Managed Care, Other (non HMO) | Attending: Internal Medicine

## 2016-05-19 DIAGNOSIS — C538 Malignant neoplasm of overlapping sites of cervix uteri: Secondary | ICD-10-CM | POA: Insufficient documentation

## 2016-05-19 DIAGNOSIS — Z90710 Acquired absence of both cervix and uterus: Secondary | ICD-10-CM | POA: Insufficient documentation

## 2016-05-19 DIAGNOSIS — Z9221 Personal history of antineoplastic chemotherapy: Secondary | ICD-10-CM | POA: Insufficient documentation

## 2016-05-19 DIAGNOSIS — Z6841 Body Mass Index (BMI) 40.0 and over, adult: Secondary | ICD-10-CM | POA: Insufficient documentation

## 2016-05-19 DIAGNOSIS — Z923 Personal history of irradiation: Secondary | ICD-10-CM | POA: Insufficient documentation

## 2016-05-19 DIAGNOSIS — N3041 Irradiation cystitis with hematuria: Secondary | ICD-10-CM | POA: Insufficient documentation

## 2016-05-19 DIAGNOSIS — I10 Essential (primary) hypertension: Secondary | ICD-10-CM | POA: Insufficient documentation

## 2016-05-21 DIAGNOSIS — I10 Essential (primary) hypertension: Secondary | ICD-10-CM | POA: Diagnosis not present

## 2016-05-21 DIAGNOSIS — Z9221 Personal history of antineoplastic chemotherapy: Secondary | ICD-10-CM | POA: Diagnosis not present

## 2016-05-21 DIAGNOSIS — N3041 Irradiation cystitis with hematuria: Secondary | ICD-10-CM | POA: Diagnosis present

## 2016-05-21 DIAGNOSIS — Z923 Personal history of irradiation: Secondary | ICD-10-CM | POA: Diagnosis not present

## 2016-05-21 DIAGNOSIS — Z6841 Body Mass Index (BMI) 40.0 and over, adult: Secondary | ICD-10-CM | POA: Diagnosis not present

## 2016-05-21 DIAGNOSIS — Z90710 Acquired absence of both cervix and uterus: Secondary | ICD-10-CM | POA: Diagnosis not present

## 2016-05-21 DIAGNOSIS — C538 Malignant neoplasm of overlapping sites of cervix uteri: Secondary | ICD-10-CM | POA: Diagnosis not present

## 2016-05-28 DIAGNOSIS — N3041 Irradiation cystitis with hematuria: Secondary | ICD-10-CM | POA: Diagnosis not present

## 2016-05-29 ENCOUNTER — Telehealth: Payer: Self-pay

## 2016-05-29 NOTE — Telephone Encounter (Signed)
Requesting a prescription for xanax for hypobaric chamber treatment and was informed by the physician that she should ask Dr. Naaman Plummer for said prescription.   Please advise

## 2016-05-30 MED ORDER — ALPRAZOLAM 0.25 MG PO TABS: 0.2500 mg | ORAL_TABLET | Freq: Every evening | ORAL | 0 refills | Status: DC | PRN

## 2016-05-30 NOTE — Telephone Encounter (Signed)
Per Zella Ball  She can have 0.25 mg xanax qd prn. Called to pharmacy. [place]on notified.

## 2016-05-30 NOTE — Telephone Encounter (Signed)
Whomever is following her wound should write the rx for hyperbaric oxygen. I am not treating the wound.   Xanax order is fine and can be called in.   thanks

## 2016-06-03 DIAGNOSIS — N3041 Irradiation cystitis with hematuria: Secondary | ICD-10-CM | POA: Diagnosis not present

## 2016-06-04 DIAGNOSIS — N3041 Irradiation cystitis with hematuria: Secondary | ICD-10-CM | POA: Diagnosis not present

## 2016-06-05 ENCOUNTER — Encounter (HOSPITAL_BASED_OUTPATIENT_CLINIC_OR_DEPARTMENT_OTHER): Payer: 59 | Attending: Internal Medicine

## 2016-06-05 DIAGNOSIS — Z90710 Acquired absence of both cervix and uterus: Secondary | ICD-10-CM | POA: Diagnosis not present

## 2016-06-05 DIAGNOSIS — N3041 Irradiation cystitis with hematuria: Secondary | ICD-10-CM | POA: Insufficient documentation

## 2016-06-05 DIAGNOSIS — C538 Malignant neoplasm of overlapping sites of cervix uteri: Secondary | ICD-10-CM | POA: Diagnosis present

## 2016-06-05 DIAGNOSIS — I10 Essential (primary) hypertension: Secondary | ICD-10-CM | POA: Insufficient documentation

## 2016-06-05 DIAGNOSIS — D649 Anemia, unspecified: Secondary | ICD-10-CM | POA: Diagnosis not present

## 2016-06-05 DIAGNOSIS — Z923 Personal history of irradiation: Secondary | ICD-10-CM | POA: Insufficient documentation

## 2016-06-05 DIAGNOSIS — Z9221 Personal history of antineoplastic chemotherapy: Secondary | ICD-10-CM | POA: Insufficient documentation

## 2016-06-06 DIAGNOSIS — C538 Malignant neoplasm of overlapping sites of cervix uteri: Secondary | ICD-10-CM | POA: Diagnosis not present

## 2016-06-09 DIAGNOSIS — L02413 Cutaneous abscess of right upper limb: Secondary | ICD-10-CM | POA: Diagnosis not present

## 2016-06-09 DIAGNOSIS — N390 Urinary tract infection, site not specified: Secondary | ICD-10-CM | POA: Diagnosis not present

## 2016-06-09 DIAGNOSIS — R6521 Severe sepsis with septic shock: Secondary | ICD-10-CM | POA: Diagnosis not present

## 2016-06-09 DIAGNOSIS — C538 Malignant neoplasm of overlapping sites of cervix uteri: Secondary | ICD-10-CM | POA: Diagnosis not present

## 2016-06-09 DIAGNOSIS — B966 Bacteroides fragilis [B. fragilis] as the cause of diseases classified elsewhere: Secondary | ICD-10-CM | POA: Diagnosis not present

## 2016-06-10 DIAGNOSIS — C538 Malignant neoplasm of overlapping sites of cervix uteri: Secondary | ICD-10-CM | POA: Diagnosis not present

## 2016-06-11 DIAGNOSIS — C538 Malignant neoplasm of overlapping sites of cervix uteri: Secondary | ICD-10-CM | POA: Diagnosis not present

## 2016-06-12 DIAGNOSIS — C538 Malignant neoplasm of overlapping sites of cervix uteri: Secondary | ICD-10-CM | POA: Diagnosis not present

## 2016-06-13 DIAGNOSIS — C538 Malignant neoplasm of overlapping sites of cervix uteri: Secondary | ICD-10-CM | POA: Diagnosis not present

## 2016-06-18 DIAGNOSIS — C538 Malignant neoplasm of overlapping sites of cervix uteri: Secondary | ICD-10-CM | POA: Diagnosis not present

## 2016-06-19 DIAGNOSIS — C538 Malignant neoplasm of overlapping sites of cervix uteri: Secondary | ICD-10-CM | POA: Diagnosis not present

## 2016-06-20 DIAGNOSIS — C538 Malignant neoplasm of overlapping sites of cervix uteri: Secondary | ICD-10-CM | POA: Diagnosis not present

## 2016-06-23 DIAGNOSIS — C538 Malignant neoplasm of overlapping sites of cervix uteri: Secondary | ICD-10-CM | POA: Diagnosis not present

## 2016-06-24 DIAGNOSIS — C538 Malignant neoplasm of overlapping sites of cervix uteri: Secondary | ICD-10-CM | POA: Diagnosis not present

## 2016-06-26 DIAGNOSIS — C538 Malignant neoplasm of overlapping sites of cervix uteri: Secondary | ICD-10-CM | POA: Diagnosis not present

## 2016-06-27 DIAGNOSIS — C538 Malignant neoplasm of overlapping sites of cervix uteri: Secondary | ICD-10-CM | POA: Diagnosis not present

## 2016-06-30 DIAGNOSIS — C538 Malignant neoplasm of overlapping sites of cervix uteri: Secondary | ICD-10-CM | POA: Diagnosis not present

## 2016-07-01 DIAGNOSIS — C538 Malignant neoplasm of overlapping sites of cervix uteri: Secondary | ICD-10-CM | POA: Diagnosis not present

## 2016-07-02 DIAGNOSIS — C538 Malignant neoplasm of overlapping sites of cervix uteri: Secondary | ICD-10-CM | POA: Diagnosis not present

## 2016-07-03 ENCOUNTER — Other Ambulatory Visit: Payer: Self-pay

## 2016-07-03 DIAGNOSIS — C538 Malignant neoplasm of overlapping sites of cervix uteri: Secondary | ICD-10-CM | POA: Diagnosis not present

## 2016-07-03 NOTE — Telephone Encounter (Signed)
Patient called and left medication refill request message for oxycodone 5mg  tablet, last note stated:  Follow up with me PRN.   no follow up appointment made.  Please advise

## 2016-07-04 DIAGNOSIS — C538 Malignant neoplasm of overlapping sites of cervix uteri: Secondary | ICD-10-CM | POA: Diagnosis not present

## 2016-07-04 NOTE — Telephone Encounter (Signed)
No oxycodone from me without a visit. She can see eunice too

## 2016-07-07 ENCOUNTER — Encounter (HOSPITAL_BASED_OUTPATIENT_CLINIC_OR_DEPARTMENT_OTHER): Payer: 59 | Attending: Internal Medicine

## 2016-07-07 ENCOUNTER — Telehealth: Payer: Self-pay | Admitting: Physical Medicine & Rehabilitation

## 2016-07-07 DIAGNOSIS — N3040 Irradiation cystitis without hematuria: Secondary | ICD-10-CM | POA: Insufficient documentation

## 2016-07-07 DIAGNOSIS — Y842 Radiological procedure and radiotherapy as the cause of abnormal reaction of the patient, or of later complication, without mention of misadventure at the time of the procedure: Secondary | ICD-10-CM | POA: Diagnosis not present

## 2016-07-07 NOTE — Telephone Encounter (Signed)
I attempted to reach Mrs Ehler without success. I left a message per DPR with request to make an appt with Dr Naaman Plummer or Zella Ball if she needs medication but no medication will be prescribed without visit.

## 2016-07-07 NOTE — Telephone Encounter (Signed)
Lyndee Leo called office and states that rx refille of oxy 5 mg. She either needs meds or appointment before they run out phone number [phone]

## 2016-07-08 DIAGNOSIS — N3040 Irradiation cystitis without hematuria: Secondary | ICD-10-CM | POA: Diagnosis not present

## 2016-07-10 DIAGNOSIS — N3040 Irradiation cystitis without hematuria: Secondary | ICD-10-CM | POA: Diagnosis not present

## 2016-07-11 DIAGNOSIS — N3040 Irradiation cystitis without hematuria: Secondary | ICD-10-CM | POA: Diagnosis not present

## 2016-07-14 DIAGNOSIS — N3040 Irradiation cystitis without hematuria: Secondary | ICD-10-CM | POA: Diagnosis not present

## 2016-07-15 DIAGNOSIS — N3040 Irradiation cystitis without hematuria: Secondary | ICD-10-CM | POA: Diagnosis not present

## 2016-07-16 DIAGNOSIS — N3040 Irradiation cystitis without hematuria: Secondary | ICD-10-CM | POA: Diagnosis not present

## 2016-07-17 DIAGNOSIS — N3040 Irradiation cystitis without hematuria: Secondary | ICD-10-CM | POA: Diagnosis not present

## 2016-07-18 DIAGNOSIS — N3040 Irradiation cystitis without hematuria: Secondary | ICD-10-CM | POA: Diagnosis not present

## 2016-08-17 ENCOUNTER — Other Ambulatory Visit: Payer: Self-pay | Admitting: Endocrinology

## 2016-11-07 ENCOUNTER — Encounter (HOSPITAL_BASED_OUTPATIENT_CLINIC_OR_DEPARTMENT_OTHER): Payer: 59 | Attending: Internal Medicine

## 2016-11-07 DIAGNOSIS — Z6841 Body Mass Index (BMI) 40.0 and over, adult: Secondary | ICD-10-CM | POA: Diagnosis not present

## 2016-11-07 DIAGNOSIS — N766 Ulceration of vulva: Secondary | ICD-10-CM | POA: Diagnosis not present

## 2016-11-07 DIAGNOSIS — Z923 Personal history of irradiation: Secondary | ICD-10-CM | POA: Insufficient documentation

## 2016-11-07 DIAGNOSIS — N3041 Irradiation cystitis with hematuria: Secondary | ICD-10-CM | POA: Diagnosis not present

## 2016-11-07 DIAGNOSIS — L598 Other specified disorders of the skin and subcutaneous tissue related to radiation: Secondary | ICD-10-CM | POA: Insufficient documentation

## 2016-11-07 DIAGNOSIS — C538 Malignant neoplasm of overlapping sites of cervix uteri: Secondary | ICD-10-CM | POA: Insufficient documentation

## 2016-11-07 DIAGNOSIS — Y842 Radiological procedure and radiotherapy as the cause of abnormal reaction of the patient, or of later complication, without mention of misadventure at the time of the procedure: Secondary | ICD-10-CM | POA: Insufficient documentation

## 2016-11-24 DIAGNOSIS — L598 Other specified disorders of the skin and subcutaneous tissue related to radiation: Secondary | ICD-10-CM | POA: Diagnosis not present

## 2016-11-25 DIAGNOSIS — L598 Other specified disorders of the skin and subcutaneous tissue related to radiation: Secondary | ICD-10-CM | POA: Diagnosis not present

## 2016-11-26 DIAGNOSIS — L598 Other specified disorders of the skin and subcutaneous tissue related to radiation: Secondary | ICD-10-CM | POA: Diagnosis not present

## 2016-11-27 DIAGNOSIS — L598 Other specified disorders of the skin and subcutaneous tissue related to radiation: Secondary | ICD-10-CM | POA: Diagnosis not present

## 2016-11-28 DIAGNOSIS — L598 Other specified disorders of the skin and subcutaneous tissue related to radiation: Secondary | ICD-10-CM | POA: Diagnosis not present

## 2016-12-01 DIAGNOSIS — L598 Other specified disorders of the skin and subcutaneous tissue related to radiation: Secondary | ICD-10-CM | POA: Diagnosis not present

## 2016-12-02 DIAGNOSIS — L598 Other specified disorders of the skin and subcutaneous tissue related to radiation: Secondary | ICD-10-CM | POA: Diagnosis not present

## 2016-12-03 DIAGNOSIS — L598 Other specified disorders of the skin and subcutaneous tissue related to radiation: Secondary | ICD-10-CM | POA: Diagnosis not present

## 2016-12-04 DIAGNOSIS — L598 Other specified disorders of the skin and subcutaneous tissue related to radiation: Secondary | ICD-10-CM | POA: Diagnosis not present

## 2016-12-05 DIAGNOSIS — L598 Other specified disorders of the skin and subcutaneous tissue related to radiation: Secondary | ICD-10-CM | POA: Diagnosis not present

## 2016-12-09 ENCOUNTER — Encounter (HOSPITAL_BASED_OUTPATIENT_CLINIC_OR_DEPARTMENT_OTHER): Payer: 59 | Attending: Internal Medicine

## 2016-12-09 DIAGNOSIS — Y838 Other surgical procedures as the cause of abnormal reaction of the patient, or of later complication, without mention of misadventure at the time of the procedure: Secondary | ICD-10-CM | POA: Insufficient documentation

## 2016-12-09 DIAGNOSIS — T66XXXA Radiation sickness, unspecified, initial encounter: Secondary | ICD-10-CM | POA: Insufficient documentation

## 2016-12-10 DIAGNOSIS — T66XXXA Radiation sickness, unspecified, initial encounter: Secondary | ICD-10-CM | POA: Diagnosis not present

## 2016-12-11 DIAGNOSIS — T66XXXA Radiation sickness, unspecified, initial encounter: Secondary | ICD-10-CM | POA: Diagnosis not present

## 2016-12-12 DIAGNOSIS — T66XXXA Radiation sickness, unspecified, initial encounter: Secondary | ICD-10-CM | POA: Diagnosis not present

## 2016-12-15 DIAGNOSIS — T66XXXA Radiation sickness, unspecified, initial encounter: Secondary | ICD-10-CM | POA: Diagnosis not present

## 2016-12-17 DIAGNOSIS — T66XXXA Radiation sickness, unspecified, initial encounter: Secondary | ICD-10-CM | POA: Diagnosis not present

## 2016-12-23 DIAGNOSIS — T66XXXA Radiation sickness, unspecified, initial encounter: Secondary | ICD-10-CM | POA: Diagnosis not present

## 2016-12-24 DIAGNOSIS — T66XXXA Radiation sickness, unspecified, initial encounter: Secondary | ICD-10-CM | POA: Diagnosis not present

## 2016-12-25 DIAGNOSIS — T66XXXA Radiation sickness, unspecified, initial encounter: Secondary | ICD-10-CM | POA: Diagnosis not present

## 2016-12-29 DIAGNOSIS — T66XXXA Radiation sickness, unspecified, initial encounter: Secondary | ICD-10-CM | POA: Diagnosis not present

## 2016-12-30 DIAGNOSIS — T66XXXA Radiation sickness, unspecified, initial encounter: Secondary | ICD-10-CM | POA: Diagnosis not present

## 2016-12-31 DIAGNOSIS — T66XXXA Radiation sickness, unspecified, initial encounter: Secondary | ICD-10-CM | POA: Diagnosis not present

## 2017-01-01 DIAGNOSIS — T66XXXA Radiation sickness, unspecified, initial encounter: Secondary | ICD-10-CM | POA: Diagnosis not present

## 2017-01-05 ENCOUNTER — Encounter (HOSPITAL_BASED_OUTPATIENT_CLINIC_OR_DEPARTMENT_OTHER): Payer: 59 | Attending: Internal Medicine

## 2017-01-05 DIAGNOSIS — L598 Other specified disorders of the skin and subcutaneous tissue related to radiation: Secondary | ICD-10-CM | POA: Diagnosis not present

## 2017-01-05 DIAGNOSIS — C541 Malignant neoplasm of endometrium: Secondary | ICD-10-CM | POA: Diagnosis not present

## 2017-01-05 DIAGNOSIS — Z923 Personal history of irradiation: Secondary | ICD-10-CM | POA: Insufficient documentation

## 2017-01-05 DIAGNOSIS — Y842 Radiological procedure and radiotherapy as the cause of abnormal reaction of the patient, or of later complication, without mention of misadventure at the time of the procedure: Secondary | ICD-10-CM | POA: Insufficient documentation

## 2017-01-05 DIAGNOSIS — N766 Ulceration of vulva: Secondary | ICD-10-CM | POA: Insufficient documentation

## 2017-01-06 DIAGNOSIS — L598 Other specified disorders of the skin and subcutaneous tissue related to radiation: Secondary | ICD-10-CM | POA: Diagnosis not present

## 2017-01-07 DIAGNOSIS — L598 Other specified disorders of the skin and subcutaneous tissue related to radiation: Secondary | ICD-10-CM | POA: Diagnosis not present

## 2017-01-12 DIAGNOSIS — L598 Other specified disorders of the skin and subcutaneous tissue related to radiation: Secondary | ICD-10-CM | POA: Diagnosis not present

## 2017-01-13 DIAGNOSIS — L598 Other specified disorders of the skin and subcutaneous tissue related to radiation: Secondary | ICD-10-CM | POA: Diagnosis not present

## 2017-01-14 DIAGNOSIS — L598 Other specified disorders of the skin and subcutaneous tissue related to radiation: Secondary | ICD-10-CM | POA: Diagnosis not present

## 2017-01-15 DIAGNOSIS — L598 Other specified disorders of the skin and subcutaneous tissue related to radiation: Secondary | ICD-10-CM | POA: Diagnosis not present

## 2017-01-19 DIAGNOSIS — L598 Other specified disorders of the skin and subcutaneous tissue related to radiation: Secondary | ICD-10-CM | POA: Diagnosis not present

## 2017-01-20 DIAGNOSIS — L598 Other specified disorders of the skin and subcutaneous tissue related to radiation: Secondary | ICD-10-CM | POA: Diagnosis not present

## 2017-01-21 DIAGNOSIS — L598 Other specified disorders of the skin and subcutaneous tissue related to radiation: Secondary | ICD-10-CM | POA: Diagnosis not present

## 2017-01-22 DIAGNOSIS — L598 Other specified disorders of the skin and subcutaneous tissue related to radiation: Secondary | ICD-10-CM | POA: Diagnosis not present

## 2017-01-26 DIAGNOSIS — L598 Other specified disorders of the skin and subcutaneous tissue related to radiation: Secondary | ICD-10-CM | POA: Diagnosis not present

## 2017-01-27 DIAGNOSIS — L598 Other specified disorders of the skin and subcutaneous tissue related to radiation: Secondary | ICD-10-CM | POA: Diagnosis not present

## 2017-01-28 DIAGNOSIS — L598 Other specified disorders of the skin and subcutaneous tissue related to radiation: Secondary | ICD-10-CM | POA: Diagnosis not present

## 2017-01-29 DIAGNOSIS — L598 Other specified disorders of the skin and subcutaneous tissue related to radiation: Secondary | ICD-10-CM | POA: Diagnosis not present

## 2017-02-02 DIAGNOSIS — L598 Other specified disorders of the skin and subcutaneous tissue related to radiation: Secondary | ICD-10-CM | POA: Diagnosis not present

## 2017-02-04 DIAGNOSIS — L598 Other specified disorders of the skin and subcutaneous tissue related to radiation: Secondary | ICD-10-CM | POA: Diagnosis not present

## 2017-06-12 ENCOUNTER — Emergency Department (HOSPITAL_COMMUNITY): Payer: 59

## 2017-06-12 ENCOUNTER — Other Ambulatory Visit: Payer: Self-pay

## 2017-06-12 ENCOUNTER — Inpatient Hospital Stay (HOSPITAL_COMMUNITY)
Admission: EM | Admit: 2017-06-12 | Discharge: 2017-06-19 | DRG: 580 | Disposition: A | Discharge status: Home Health | Payer: 59 | Attending: Family Medicine | Admitting: Family Medicine

## 2017-06-12 ENCOUNTER — Encounter (HOSPITAL_COMMUNITY): Payer: Self-pay

## 2017-06-12 DIAGNOSIS — C52 Malignant neoplasm of vagina: Secondary | ICD-10-CM | POA: Diagnosis present

## 2017-06-12 DIAGNOSIS — D630 Anemia in neoplastic disease: Secondary | ICD-10-CM | POA: Diagnosis present

## 2017-06-12 DIAGNOSIS — I10 Essential (primary) hypertension: Secondary | ICD-10-CM | POA: Diagnosis present

## 2017-06-12 DIAGNOSIS — L03314 Cellulitis of groin: Secondary | ICD-10-CM

## 2017-06-12 DIAGNOSIS — L02215 Cutaneous abscess of perineum: Secondary | ICD-10-CM | POA: Diagnosis not present

## 2017-06-12 DIAGNOSIS — R300 Dysuria: Secondary | ICD-10-CM

## 2017-06-12 DIAGNOSIS — E119 Type 2 diabetes mellitus without complications: Secondary | ICD-10-CM

## 2017-06-12 DIAGNOSIS — R310 Gross hematuria: Secondary | ICD-10-CM

## 2017-06-12 DIAGNOSIS — M6008 Infective myositis, other site: Secondary | ICD-10-CM | POA: Diagnosis present

## 2017-06-12 DIAGNOSIS — R102 Pelvic and perineal pain: Secondary | ICD-10-CM | POA: Diagnosis not present

## 2017-06-12 DIAGNOSIS — Z23 Encounter for immunization: Secondary | ICD-10-CM

## 2017-06-12 DIAGNOSIS — Z923 Personal history of irradiation: Secondary | ICD-10-CM

## 2017-06-12 DIAGNOSIS — M8669 Other chronic osteomyelitis, multiple sites: Secondary | ICD-10-CM | POA: Diagnosis present

## 2017-06-12 DIAGNOSIS — Z9221 Personal history of antineoplastic chemotherapy: Secondary | ICD-10-CM

## 2017-06-12 DIAGNOSIS — L02214 Cutaneous abscess of groin: Secondary | ICD-10-CM

## 2017-06-12 DIAGNOSIS — Z8541 Personal history of malignant neoplasm of cervix uteri: Secondary | ICD-10-CM

## 2017-06-12 DIAGNOSIS — C519 Malignant neoplasm of vulva, unspecified: Secondary | ICD-10-CM

## 2017-06-12 DIAGNOSIS — C549 Malignant neoplasm of corpus uteri, unspecified: Secondary | ICD-10-CM

## 2017-06-12 DIAGNOSIS — Z6841 Body Mass Index (BMI) 40.0 and over, adult: Secondary | ICD-10-CM

## 2017-06-12 DIAGNOSIS — R5381 Other malaise: Secondary | ICD-10-CM

## 2017-06-12 DIAGNOSIS — L98499 Non-pressure chronic ulcer of skin of other sites with unspecified severity: Secondary | ICD-10-CM | POA: Diagnosis present

## 2017-06-12 DIAGNOSIS — Z79890 Hormone replacement therapy (postmenopausal): Secondary | ICD-10-CM

## 2017-06-12 DIAGNOSIS — E1169 Type 2 diabetes mellitus with other specified complication: Secondary | ICD-10-CM | POA: Diagnosis present

## 2017-06-12 DIAGNOSIS — C541 Malignant neoplasm of endometrium: Secondary | ICD-10-CM | POA: Diagnosis present

## 2017-06-12 DIAGNOSIS — L03315 Cellulitis of perineum: Secondary | ICD-10-CM | POA: Diagnosis present

## 2017-06-12 DIAGNOSIS — N829 Female genital tract fistula, unspecified: Secondary | ICD-10-CM

## 2017-06-12 LAB — CBC WITH DIFFERENTIAL/PLATELET
Basophils Absolute: 0 10*3/uL (ref 0.0–0.1)
Basophils Relative: 0 %
Eosinophils Absolute: 0.1 10*3/uL (ref 0.0–0.7)
Eosinophils Relative: 1 %
HEMATOCRIT: 41.7 % (ref 36.0–46.0)
Hemoglobin: 13.8 g/dL (ref 12.0–15.0)
LYMPHS ABS: 1.4 10*3/uL (ref 0.7–4.0)
LYMPHS PCT: 13 %
MCH: 32.1 pg (ref 26.0–34.0)
MCHC: 33.1 g/dL (ref 30.0–36.0)
MCV: 97 fL (ref 78.0–100.0)
MONO ABS: 0.6 10*3/uL (ref 0.1–1.0)
MONOS PCT: 6 %
Neutro Abs: 8.5 10*3/uL — ABNORMAL HIGH (ref 1.7–7.7)
Neutrophils Relative %: 80 %
Platelets: 288 10*3/uL (ref 150–400)
RBC: 4.3 MIL/uL (ref 3.87–5.11)
RDW: 14.6 % (ref 11.5–15.5)
WBC: 10.5 10*3/uL (ref 4.0–10.5)

## 2017-06-12 LAB — COMPREHENSIVE METABOLIC PANEL
ALK PHOS: 101 U/L (ref 38–126)
ALT: 36 U/L (ref 14–54)
AST: 34 U/L (ref 15–41)
Albumin: 3.1 g/dL — ABNORMAL LOW (ref 3.5–5.0)
Anion gap: 13 (ref 5–15)
BUN: 17 mg/dL (ref 6–20)
CALCIUM: 8.8 mg/dL — AB (ref 8.9–10.3)
CO2: 23 mmol/L (ref 22–32)
CREATININE: 0.58 mg/dL (ref 0.44–1.00)
Chloride: 102 mmol/L (ref 101–111)
GFR calc non Af Amer: >60 mL/min (ref >60)
GLUCOSE: 107 mg/dL — AB (ref 65–99)
Potassium: 3.4 mmol/L — ABNORMAL LOW (ref 3.5–5.1)
Sodium: 138 mmol/L (ref 135–145)
Total Bilirubin: 0.5 mg/dL (ref 0.3–1.2)
Total Protein: 7.2 g/dL (ref 6.5–8.1)

## 2017-06-12 LAB — URINALYSIS, ROUTINE W REFLEX MICROSCOPIC
Bilirubin Urine: NEGATIVE
Glucose, UA: NEGATIVE mg/dL
KETONES UR: 5 mg/dL — AB
Nitrite: NEGATIVE
PROTEIN: NEGATIVE mg/dL
Specific Gravity, Urine: 1.01 (ref 1.005–1.030)
pH: 6 (ref 5.0–8.0)

## 2017-06-12 LAB — PROTIME-INR
INR: 1.05
Prothrombin Time: 13.6 seconds (ref 11.4–15.2)

## 2017-06-12 LAB — I-STAT CG4 LACTIC ACID, ED
LACTIC ACID, VENOUS: 0.89 mmol/L (ref 0.5–1.9)
Lactic Acid, Venous: 2.82 mmol/L (ref 0.5–1.9)

## 2017-06-12 MED ORDER — IOPAMIDOL (ISOVUE-300) INJECTION 61%
INTRAVENOUS | Status: AC
  Administered 2017-06-12: 100 mL
  Filled 2017-06-12: qty 100

## 2017-06-12 MED ORDER — CLINDAMYCIN PHOSPHATE 600 MG/50ML IV SOLN
600.0000 mg | Freq: Once | INTRAVENOUS | Status: AC
  Administered 2017-06-12: 600 mg via INTRAVENOUS
  Filled 2017-06-12: qty 50

## 2017-06-12 MED ORDER — SODIUM CHLORIDE 0.9 % IV BOLUS (SEPSIS)
1000.0000 mL | Freq: Once | INTRAVENOUS | Status: AC
  Administered 2017-06-12: 1000 mL via INTRAVENOUS

## 2017-06-12 NOTE — ED Notes (Signed)
Nurse will start IV and draw labs. 

## 2017-06-12 NOTE — ED Notes (Signed)
ED Provider at bedside. 

## 2017-06-12 NOTE — ED Provider Notes (Signed)
[facility] EMERGENCY DEPARTMENT Provider Note   CSN: [id number] Arrival date & time: 06/12/17  1443     History   Chief Complaint Chief Complaint  Patient presents with  . Fever  . Abscess    HPI Asanti Craigo is a 62 y.o. female.  HPI   62 year old female with history of endometrial cancer, cervical cancer, prior history of necrotizing fasciitis, diabetes, obesity, recurrent abscess presenting for evaluation right groin pain.  Patient report having pain and swelling to her right groin region for the past 5 days.  Initially it was a small swelling follows with formation of a ball and fever.  Fever spike up to 102 at home.  She was seen by her oncologist 4 days ago for her infection.  She was given Keflex and Aleve.  She has been taking the medication as prescribed.  Last night the boil drained and she felt a bit better.  She was seen by her PCP today who recommend patient to come to the ER due to prior history of necrotizing fasciitis several years ago.  Currently patient denies having fever, dizziness, chest pain, shortness of breath, dysuria.  Her pain is mild when not palpated but worse with palpation.  denies any generalized weakness.  No report of nausea vomiting diarrhea.    Past Medical History:  Diagnosis Date  . Cervical adenocarcinoma ([place])    stage II  . Endometrial cancer ([place])    with recurrance at introitus   . History of prediabetes   . 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 Renal disorder     Patient Active Problem List   Diagnosis Date Noted  . Dysuria 03/05/2016  . Acute lower UTI   . Debility 02/22/2016  . Abscess of right groin 02/20/2016  . Fever   . History of cervical cancer   . Acute blood loss anemia   . Anemia of chronic disease   . Tachypnea   . Lymphocytosis   . Super obese   . Sepsis ([place])   . Septic shock ([place]) 02/14/2016  . UTI (urinary tract infection)   . Obesity, morbid ([place]) 02/21/2013    . Essential hypertension 11/23/2012  . Type II or unspecified type diabetes mellitus without mention of complication, not stated as uncontrolled 11/23/2012  . Other and unspecified hyperlipidemia 11/23/2012    Past Surgical History:  Procedure Laterality Date  . ABDOMINAL HYSTERECTOMY    . I&D EXTREMITY Right 02/20/2016   Procedure: IRRIGATION AND DEBRIDEMENT EXTREMITY RIGHT UPPER THIGH;  Surgeon: Fanny Skates, MD;  Location: [facility];  Service: General;  Laterality: Right;  . IRRIGATION AND DEBRIDEMENT ABSCESS Right 02/16/2016   Procedure: IRRIGATION AND DEBRIDEMENT RIGHT GROIN ABSCESS, FULL THICKNESS DEBRIDMENT RIGHT GROIN INCLUDING > 40CM SQUARE.;  Surgeon: Arta Bruce Kinsinger, MD;  Location: [facility];  Service: General;  Laterality: Right;  . IRRIGATION AND DEBRIDEMENT ABSCESS Right 02/20/2016   Procedure: IRRIGATION AND DEBRIDEMENT ABSCESS;  Surgeon: Fanny Skates, MD;  Location: [facility];  Service: General;  Laterality: Right;  Perii-Vaginal Abscess    OB History    No data available       Home Medications    Prior to Admission medications   Medication Sig Start Date End Date Taking? Authorizing Provider  atorvastatin (LIPITOR) 40 MG tablet TAKE 1 TABLET DAILY Patient taking differently: TAKE 40 mg  TABLET DAILY 05/19/16  Yes Elayne Snare, MD  cephALEXin (KEFLEX) 500 MG capsule Take 500  mg by mouth 4 (four) times daily.  03/05/16  Yes [provider]  oxyCODONE (OXY IR/ROXICODONE) 5 MG immediate release tablet Take 1 tablet (5 mg total) by mouth every 6 (six) hours as needed for severe pain. 04/29/16  Yes Meredith Staggers, MD  polyethylene glycol [facility] / Floria Raveling) packet Take 17 g by mouth daily.   Yes [provider]  PREMARIN vaginal cream Place 1 application vaginally as needed. 04/30/17  Yes [provider]  Probiotic Product (PROBIOTIC DAILY PO) Take 1 tablet by mouth daily.    Yes [provider]  ALPRAZolam (XANAX) 0.25 MG tablet Take 1  tablet (0.25 mg total) by mouth at bedtime as needed for anxiety. Patient not taking: Reported on 06/12/2017 05/30/16   Meredith Staggers, MD  Multiple Vitamin (MULTIVITAMIN WITH MINERALS) TABS tablet Take 1 tablet by mouth daily. Patient not taking: Reported on 06/12/2017 03/06/16   Love, Ivan Anchors, PA-C  vitamin C (VITAMIN C) 500 MG tablet Take 1 tablet (500 mg total) by mouth 2 (two) times daily. Patient not taking: Reported on 06/12/2017 03/05/16   Bary Leriche, PA-C  Vitamin D, Ergocalciferol, (DRISDOL) 50000 units CAPS capsule TAKE 1 CAPSULE WEEKLY 05/19/16   Elayne Snare, MD    Family History Family History  Problem Relation Age of Onset  . Hypertension Mother   . Heart disease Father 68       Expired  . Diabetes Neg Hx     Social History Social History   Tobacco Use  . Smoking status: Never Smoker  . Smokeless tobacco: Never Used  Substance Use Topics  . Alcohol use: No  . Drug use: No     Allergies   Patient has no known allergies.   Review of Systems Review of Systems  All other systems reviewed and are negative.    Physical Exam Updated Vital Signs BP (!) 125/55 (BP Location: Left Arm)   Pulse 89   Temp 98.9 F (37.2 C) (Oral)   Resp 18   SpO2 97%   Physical Exam  Constitutional: She appears well-developed and well-nourished. No distress.  HENT:  Head: Atraumatic.  Eyes: Conjunctivae are normal.  Neck: Neck supple.  Cardiovascular: Normal rate and regular rhythm.  Pulmonary/Chest: Breath sounds normal.  Abdominal: Soft.  Genitourinary:  Genitourinary Comments: Chaperone present during exam.  Around the mons pubis and right area adjacent to the labia there is induration draining out purulent material with surrounding skin erythema and tenderness.  Examination is limited due to large body habitus.  No evidence of pseudoaneurysm.  The perineum is soft.  Neurological: She is alert.  Skin: No rash noted.  Psychiatric: She has a normal mood and affect.    Nursing note and vitals reviewed.    ED Treatments / Results  Labs (all labs ordered are listed, but only abnormal results are displayed) Labs Reviewed  COMPREHENSIVE METABOLIC PANEL - Abnormal; Notable for the following components:      Result Value   Potassium 3.4 (*)    Glucose, Bld 107 (*)    Calcium 8.8 (*)    Albumin 3.1 (*)    All other components within normal limits  CBC WITH DIFFERENTIAL/PLATELET - Abnormal; Notable for the following components:   Neutro Abs 8.5 (*)    All other components within normal limits  URINALYSIS, ROUTINE W REFLEX MICROSCOPIC - Abnormal; Notable for the following components:   Hgb urine dipstick MODERATE (*)    Ketones, ur 5 (*)  Leukocytes, UA MODERATE (*)    Bacteria, UA RARE (*)    Squamous Epithelial / LPF 0-5 (*)    All other components within normal limits  I-STAT CG4 LACTIC ACID, ED - Abnormal; Notable for the following components:   Lactic Acid, Venous 2.82 (*)    All other components within normal limits  CULTURE, BLOOD (ROUTINE X 2)  CULTURE, BLOOD (ROUTINE X 2)  PROTIME-INR  I-STAT CG4 LACTIC ACID, ED    EKG  EKG Interpretation None       Radiology Dg Chest 2 View  Result Date: 06/12/2017 CLINICAL DATA:  Possible sepsis. EXAM: CHEST - 2 VIEW COMPARISON:  02/14/2016 and prior chest radiographs FINDINGS: RIGHT IJ Port-A-Cath with tip overlying the SUPERIOR cavoatrial junction again noted. The cardiomediastinal silhouette is unremarkable. There is no evidence of focal airspace disease, pulmonary edema, suspicious pulmonary nodule/mass, pleural effusion, or pneumothorax. No acute bony abnormalities are identified. IMPRESSION: No active cardiopulmonary disease. Electronically Signed   By: Margarette Canada M.D.   On: 06/12/2017 19:50   Ct Abdomen Pelvis W Contrast  Result Date: 06/12/2017 CLINICAL DATA:  Acute onset of right groin pain. Right groin boil has popped, oozing pus. EXAM: CT ABDOMEN AND PELVIS WITH CONTRAST TECHNIQUE:  Multidetector CT imaging of the abdomen and pelvis was performed using the standard protocol following bolus administration of intravenous contrast. CONTRAST:  178mL ISOVUE-300 IOPAMIDOL (ISOVUE-300) INJECTION 61% COMPARISON:  CT of the abdomen and pelvis performed 02/14/2016 FINDINGS: Lower chest: The visualized lung bases are grossly clear. The visualized portions of the mediastinum are unremarkable. Hepatobiliary: The liver is unremarkable in appearance. The gallbladder is unremarkable in appearance. The common bile duct remains normal in caliber. Pancreas: The pancreas is within normal limits. Spleen: The spleen is unremarkable in appearance. Adrenals/Urinary Tract: The adrenal glands are unremarkable in appearance. A small right renal cyst is noted. Mild nonspecific perinephric stranding is noted bilaterally. There is no evidence of hydronephrosis. No renal or ureteral stones are identified. Stomach/Bowel: The stomach is unremarkable in appearance. The small bowel is within normal limits. The appendix is normal in caliber, without evidence of appendicitis. Mild scattered diverticulosis is noted along the ascending colon, without evidence of diverticulitis. Vascular/Lymphatic: Scattered calcification is seen along the abdominal aorta and its branches. The abdominal aorta is otherwise grossly unremarkable. The inferior vena cava is grossly unremarkable. Small retroperitoneal nodes are likely within normal limits. No pelvic sidewall lymphadenopathy is identified. Reproductive: Soft tissue inflammation is noted about the bladder, with asymmetric right-sided bladder wall thickening. There is an unusual pattern of air about the level of the urethra and vaginal vault. The patient's right inguinal soft tissue defect demonstrates diffuse surrounding soft tissue inflammation and underlying fluid. Trace associated air tracks about the right pubic rami and pubic symphysis, with multiple erosions about the pubic symphysis  and inferior pubic rami bilaterally, compatible with diffuse osteomyelitis. There is associated diastasis of the pubic symphysis. A fistulous connection from the soft tissue defect to the bladder or urethra/vaginal vault cannot be excluded. Other: Soft tissue inflammation extends about the anterior lower abdominal wall. Musculoskeletal: No acute osseous abnormalities are identified. The visualized musculature is unremarkable in appearance. IMPRESSION: 1. Right inguinal soft tissue defect may have a fistulous connection to the bladder or urethra/vaginal vault, not well characterized on this study. Underlying fluid noted within the soft tissue defect. Asymmetric right-sided bladder wall thickening. Unusual pattern of air about the level of the urethra and vaginal vault may reflect an underlying fistula. Minimal  air tracking about the right pubic rami and pubic symphysis. 2. Diastasis of the pubic symphysis, with multiple erosions noted about the pubic symphysis and inferior pubic rami bilaterally, compatible with diffuse osteomyelitis. 3. Soft tissue inflammation extends about the anterior lower abdominal wall. 4. Mild scattered diverticulosis along the ascending colon, without evidence of diverticulitis. 5. Small right renal cyst. Aortic Atherosclerosis (ICD10-I70.0). Electronically Signed   By: Garald Balding M.D.   On: 06/12/2017 23:50    Procedures Procedures (including critical care time)  Medications Ordered in ED Medications  sodium chloride 0.9 % bolus 1,000 mL (1,000 mLs Intravenous New Bag/Given 06/12/17 2144)  clindamycin (CLEOCIN) IVPB 600 mg (600 mg Intravenous New Bag/Given 06/12/17 2341)  iopamidol (ISOVUE-300) 61 % injection (100 mLs  Contrast Given 06/12/17 2316)     Initial Impression / Assessment and Plan / ED Course  I have reviewed the triage vital signs and the nursing notes.  Pertinent labs & imaging results that were available during my care of the patient were reviewed by me and  considered in my medical decision making (see chart for details).     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  It is actively draining out purulent material.  She does have surrounding skin cellulitis and it is tender to palpation.  It is difficult for me to assess the extent of her infection.  Therefore, will obtain abdominal pelvic CT scan for further evaluation.  Labs remarkable for an elevated lactic acid of 2.82 with normal WBC, afebrile.  I have initiated IV antibiotic, clindamycin.  IV fluid given.  Pain medication offered but patient declined.  No evidence of Fournier's gangrene currently.  12:05 AM History of endometrial cancer, cervical cancer and recurrent vaginal cancer with prior right groin excising fasciitis several years prior.  Today she presenting with signs of infection involving the same location.  Abdominal pelvis CT scan demonstrate right inguinal soft tissue defect that may have fistulous connection to the bladder or urethral vaginal vault.  Unusual pattern of air about the level of urethra and vaginal vault which may reflect underlying fistula.  Soft tissue inflammation extending to the anterior abdominal wall.  Multiple erosion noted to the pubic symphysis and inferior pubic rami compatible with diffuse osteomyelitis  Appreciate consultation from gynecologist, Dr. Roselie Awkward who did reviewed the CT scan and recommend general surgery to be involved.  Appreciate consultation from general surgery, Dr. Hulen Skains, who recommend medicine for admission and he will be available for consultation.  Patient would likely benefit from IV antibiotic and hydration.  12:40 AM I  have consulted [facility] Dr. Si Raider who agrees to see pt in the ER and will admit for further management of her condition.  Pt is aware and agrees with plan.     Domenic Moras, PA-C 06/13/17 0041    Valarie Merino, MD 06/14/17 [phone]

## 2017-06-12 NOTE — ED Triage Notes (Signed)
Patient found in waiting room and triaged at this time.  Patient from home complains of pain in right groin where she had a boil that has since popped and oozing pus.  Cancer patient [facility] at [facility].  Hx of necrotizing fasciitis.  Fever of 102 at home, taken tylenol prior to arrival.  Placed on Keflex by PCP.

## 2017-06-12 NOTE — ED Notes (Signed)
Patient transported to CT 

## 2017-06-12 NOTE — ED Notes (Signed)
Delay in lab draw,  MD at bedside. 

## 2017-06-12 NOTE — ED Notes (Signed)
Called pt 3x for triage. No response.

## 2017-06-12 NOTE — ED Notes (Signed)
Pt family up at [facility] asking when they are going to be seen. Family states they have been waiting in the corner.

## 2017-06-13 ENCOUNTER — Encounter (HOSPITAL_COMMUNITY)
Admission: EM | Disposition: A | Discharge status: Home Health | Payer: Self-pay | Source: Home / Self Care | Attending: Internal Medicine

## 2017-06-13 ENCOUNTER — Inpatient Hospital Stay (HOSPITAL_COMMUNITY): Payer: 59 | Admitting: Anesthesiology

## 2017-06-13 DIAGNOSIS — M8669 Other chronic osteomyelitis, multiple sites: Secondary | ICD-10-CM | POA: Diagnosis present

## 2017-06-13 DIAGNOSIS — M6008 Infective myositis, other site: Secondary | ICD-10-CM | POA: Diagnosis present

## 2017-06-13 DIAGNOSIS — C541 Malignant neoplasm of endometrium: Secondary | ICD-10-CM

## 2017-06-13 DIAGNOSIS — L03315 Cellulitis of perineum: Secondary | ICD-10-CM | POA: Diagnosis present

## 2017-06-13 DIAGNOSIS — C519 Malignant neoplasm of vulva, unspecified: Secondary | ICD-10-CM | POA: Diagnosis present

## 2017-06-13 DIAGNOSIS — L03314 Cellulitis of groin: Secondary | ICD-10-CM | POA: Diagnosis not present

## 2017-06-13 DIAGNOSIS — Z23 Encounter for immunization: Secondary | ICD-10-CM | POA: Diagnosis not present

## 2017-06-13 DIAGNOSIS — L02215 Cutaneous abscess of perineum: Secondary | ICD-10-CM | POA: Diagnosis present

## 2017-06-13 DIAGNOSIS — D630 Anemia in neoplastic disease: Secondary | ICD-10-CM | POA: Diagnosis present

## 2017-06-13 DIAGNOSIS — E118 Type 2 diabetes mellitus with unspecified complications: Secondary | ICD-10-CM | POA: Diagnosis not present

## 2017-06-13 DIAGNOSIS — Z9221 Personal history of antineoplastic chemotherapy: Secondary | ICD-10-CM | POA: Diagnosis not present

## 2017-06-13 DIAGNOSIS — M8588 Other specified disorders of bone density and structure, other site: Secondary | ICD-10-CM | POA: Diagnosis not present

## 2017-06-13 DIAGNOSIS — Z923 Personal history of irradiation: Secondary | ICD-10-CM | POA: Diagnosis not present

## 2017-06-13 DIAGNOSIS — I10 Essential (primary) hypertension: Secondary | ICD-10-CM

## 2017-06-13 DIAGNOSIS — R102 Pelvic and perineal pain: Secondary | ICD-10-CM | POA: Diagnosis present

## 2017-06-13 DIAGNOSIS — Z79890 Hormone replacement therapy (postmenopausal): Secondary | ICD-10-CM | POA: Diagnosis not present

## 2017-06-13 DIAGNOSIS — M869 Osteomyelitis, unspecified: Secondary | ICD-10-CM | POA: Diagnosis not present

## 2017-06-13 DIAGNOSIS — L98499 Non-pressure chronic ulcer of skin of other sites with unspecified severity: Secondary | ICD-10-CM | POA: Diagnosis present

## 2017-06-13 DIAGNOSIS — Z872 Personal history of diseases of the skin and subcutaneous tissue: Secondary | ICD-10-CM | POA: Diagnosis not present

## 2017-06-13 DIAGNOSIS — E1169 Type 2 diabetes mellitus with other specified complication: Secondary | ICD-10-CM | POA: Diagnosis present

## 2017-06-13 DIAGNOSIS — Z6841 Body Mass Index (BMI) 40.0 and over, adult: Secondary | ICD-10-CM | POA: Diagnosis not present

## 2017-06-13 DIAGNOSIS — L02214 Cutaneous abscess of groin: Secondary | ICD-10-CM | POA: Diagnosis not present

## 2017-06-13 DIAGNOSIS — Z8541 Personal history of malignant neoplasm of cervix uteri: Secondary | ICD-10-CM | POA: Diagnosis not present

## 2017-06-13 DIAGNOSIS — Z95828 Presence of other vascular implants and grafts: Secondary | ICD-10-CM | POA: Diagnosis not present

## 2017-06-13 DIAGNOSIS — Z8542 Personal history of malignant neoplasm of other parts of uterus: Secondary | ICD-10-CM | POA: Diagnosis not present

## 2017-06-13 DIAGNOSIS — C52 Malignant neoplasm of vagina: Secondary | ICD-10-CM | POA: Diagnosis present

## 2017-06-13 HISTORY — PX: INCISION AND DRAINAGE PERIRECTAL ABSCESS: SHX1804

## 2017-06-13 LAB — BASIC METABOLIC PANEL
ANION GAP: 9 (ref 5–15)
BUN: 13 mg/dL (ref 6–20)
CALCIUM: 8.1 mg/dL — AB (ref 8.9–10.3)
CO2: 26 mmol/L (ref 22–32)
Chloride: 104 mmol/L (ref 101–111)
Creatinine, Ser: 0.57 mg/dL (ref 0.44–1.00)
GFR calc Af Amer: >60 mL/min (ref >60)
Glucose, Bld: 124 mg/dL — ABNORMAL HIGH (ref 65–99)
Potassium: 3.2 mmol/L — ABNORMAL LOW (ref 3.5–5.1)
SODIUM: 139 mmol/L (ref 135–145)

## 2017-06-13 LAB — CBC
HCT: 34.6 % — ABNORMAL LOW (ref 36.0–46.0)
Hemoglobin: 11.2 g/dL — ABNORMAL LOW (ref 12.0–15.0)
MCH: 31.4 pg (ref 26.0–34.0)
MCHC: 32.4 g/dL (ref 30.0–36.0)
MCV: 96.9 fL (ref 78.0–100.0)
Platelets: 256 10*3/uL (ref 150–400)
RBC: 3.57 MIL/uL — ABNORMAL LOW (ref 3.87–5.11)
RDW: 14.2 % (ref 11.5–15.5)
WBC: 8.7 10*3/uL (ref 4.0–10.5)

## 2017-06-13 LAB — SURGICAL PCR SCREEN
MRSA, PCR: NEGATIVE
Staphylococcus aureus: NEGATIVE

## 2017-06-13 LAB — TYPE AND SCREEN
ABO/RH(D): O POS
Antibody Screen: NEGATIVE

## 2017-06-13 LAB — HIV ANTIBODY (ROUTINE TESTING W REFLEX): HIV Screen 4th Generation wRfx: NONREACTIVE

## 2017-06-13 LAB — ABO/RH: ABO/RH(D): O POS

## 2017-06-13 LAB — C-REACTIVE PROTEIN: CRP: 13.2 mg/dL — ABNORMAL HIGH (ref <1.0)

## 2017-06-13 LAB — GLUCOSE, CAPILLARY: GLUCOSE-CAPILLARY: 100 mg/dL — AB (ref 65–99)

## 2017-06-13 SURGERY — INCISION AND DRAINAGE, ABSCESS, PERIRECTAL
Anesthesia: General | Site: Perineum

## 2017-06-13 MED ORDER — MIDAZOLAM HCL 2 MG/2ML IJ SOLN
INTRAMUSCULAR | Status: AC
  Filled 2017-06-13: qty 2

## 2017-06-13 MED ORDER — FENTANYL CITRATE (PF) 100 MCG/2ML IJ SOLN
INTRAMUSCULAR | Status: DC | PRN
  Administered 2017-06-13: 100 ug via INTRAVENOUS
  Administered 2017-06-13: 50 ug via INTRAVENOUS
  Administered 2017-06-13: 100 ug via INTRAVENOUS

## 2017-06-13 MED ORDER — POTASSIUM CHLORIDE CRYS ER 20 MEQ PO TBCR
40.0000 meq | EXTENDED_RELEASE_TABLET | Freq: Once | ORAL | Status: AC
  Administered 2017-06-13: 40 meq via ORAL
  Filled 2017-06-13: qty 2

## 2017-06-13 MED ORDER — SODIUM CHLORIDE 0.9% FLUSH
10.0000 mL | INTRAVENOUS | Status: DC | PRN
  Administered 2017-06-19: 10 mL
  Filled 2017-06-13: qty 40

## 2017-06-13 MED ORDER — ONDANSETRON HCL 4 MG/2ML IJ SOLN
INTRAMUSCULAR | Status: AC
  Filled 2017-06-13: qty 2

## 2017-06-13 MED ORDER — SODIUM CHLORIDE 0.9 % IV SOLN: INTRAVENOUS | Status: DC

## 2017-06-13 MED ORDER — SODIUM CHLORIDE 0.9 % IV SOLN
2.0000 g | Freq: Two times a day (BID) | INTRAVENOUS | Status: DC
  Filled 2017-06-13: qty 2

## 2017-06-13 MED ORDER — 0.9 % SODIUM CHLORIDE (POUR BTL) OPTIME
TOPICAL | Status: DC | PRN
  Administered 2017-06-13: 1000 mL

## 2017-06-13 MED ORDER — VANCOMYCIN HCL 10 G IV SOLR
1250.0000 mg | Freq: Two times a day (BID) | INTRAVENOUS | Status: DC
  Administered 2017-06-13 – 2017-06-18 (×9): 1250 mg via INTRAVENOUS
  Filled 2017-06-13 (×11): qty 1250

## 2017-06-13 MED ORDER — LIDOCAINE HCL (CARDIAC) 20 MG/ML IV SOLN
INTRAVENOUS | Status: DC | PRN
  Administered 2017-06-13: 100 mg via INTRAVENOUS

## 2017-06-13 MED ORDER — VANCOMYCIN HCL 10 G IV SOLR
2500.0000 mg | Freq: Once | INTRAVENOUS | Status: AC
  Administered 2017-06-13: 2500 mg via INTRAVENOUS
  Filled 2017-06-13: qty 2500

## 2017-06-13 MED ORDER — OXYCODONE HCL 5 MG PO TABS
5.0000 mg | ORAL_TABLET | Freq: Four times a day (QID) | ORAL | Status: DC | PRN
  Administered 2017-06-13 – 2017-06-19 (×3): 5 mg via ORAL
  Filled 2017-06-13 (×4): qty 1

## 2017-06-13 MED ORDER — HEPARIN SODIUM (PORCINE) 5000 UNIT/ML IJ SOLN
5000.0000 [IU] | Freq: Three times a day (TID) | INTRAMUSCULAR | Status: DC
  Administered 2017-06-13 – 2017-06-19 (×18): 5000 [IU] via SUBCUTANEOUS
  Filled 2017-06-13 (×18): qty 1

## 2017-06-13 MED ORDER — FENTANYL CITRATE (PF) 100 MCG/2ML IJ SOLN
25.0000 ug | INTRAMUSCULAR | Status: DC | PRN
  Administered 2017-06-13 (×2): 50 ug via INTRAVENOUS

## 2017-06-13 MED ORDER — MIDAZOLAM HCL 5 MG/5ML IJ SOLN
INTRAMUSCULAR | Status: DC | PRN
  Administered 2017-06-13: 2 mg via INTRAVENOUS

## 2017-06-13 MED ORDER — FENTANYL CITRATE (PF) 100 MCG/2ML IJ SOLN
INTRAMUSCULAR | Status: AC
  Administered 2017-06-13: 50 ug via INTRAVENOUS
  Filled 2017-06-13: qty 2

## 2017-06-13 MED ORDER — VANCOMYCIN HCL IN DEXTROSE 750-5 MG/150ML-% IV SOLN: 750.0000 mg | Freq: Two times a day (BID) | INTRAVENOUS | Status: DC

## 2017-06-13 MED ORDER — CALCIUM CHLORIDE 10 % IV SOLN
INTRAVENOUS | Status: AC
  Filled 2017-06-13: qty 20

## 2017-06-13 MED ORDER — SODIUM CHLORIDE 0.9 % IV SOLN
2.0000 g | Freq: Once | INTRAVENOUS | Status: AC
  Administered 2017-06-13: 2 g via INTRAVENOUS
  Filled 2017-06-13: qty 2

## 2017-06-13 MED ORDER — OXYCODONE HCL 5 MG PO TABS
5.0000 mg | ORAL_TABLET | Freq: Once | ORAL | Status: AC | PRN
  Administered 2017-06-13: 5 mg via ORAL

## 2017-06-13 MED ORDER — EPHEDRINE 5 MG/ML INJ
INTRAVENOUS | Status: AC
  Filled 2017-06-13: qty 10

## 2017-06-13 MED ORDER — PROPOFOL 10 MG/ML IV BOLUS
INTRAVENOUS | Status: AC
  Filled 2017-06-13: qty 20

## 2017-06-13 MED ORDER — SUCCINYLCHOLINE CHLORIDE 20 MG/ML IJ SOLN
INTRAMUSCULAR | Status: DC | PRN
  Administered 2017-06-13: 90 mg via INTRAVENOUS

## 2017-06-13 MED ORDER — SODIUM CHLORIDE 0.9 % IV SOLN
2.0000 g | Freq: Three times a day (TID) | INTRAVENOUS | Status: DC
  Administered 2017-06-13 – 2017-06-18 (×15): 2 g via INTRAVENOUS
  Filled 2017-06-13 (×17): qty 2

## 2017-06-13 MED ORDER — ONDANSETRON HCL 4 MG/2ML IJ SOLN
INTRAMUSCULAR | Status: DC | PRN
  Administered 2017-06-13: 4 mg via INTRAVENOUS

## 2017-06-13 MED ORDER — LIDOCAINE HCL (CARDIAC) 20 MG/ML IV SOLN
INTRAVENOUS | Status: AC
  Filled 2017-06-13: qty 10

## 2017-06-13 MED ORDER — ROCURONIUM BROMIDE 100 MG/10ML IV SOLN
INTRAVENOUS | Status: DC | PRN
  Administered 2017-06-13: 50 mg via INTRAVENOUS

## 2017-06-13 MED ORDER — FENTANYL CITRATE (PF) 250 MCG/5ML IJ SOLN
INTRAMUSCULAR | Status: AC
  Filled 2017-06-13: qty 5

## 2017-06-13 MED ORDER — LACTATED RINGERS IV SOLN
INTRAVENOUS | Status: DC
  Administered 2017-06-13: 11:00:00 via INTRAVENOUS

## 2017-06-13 MED ORDER — SUGAMMADEX SODIUM 200 MG/2ML IV SOLN
INTRAVENOUS | Status: AC
  Filled 2017-06-13: qty 2

## 2017-06-13 MED ORDER — OXYCODONE HCL 5 MG PO TABS
ORAL_TABLET | ORAL | Status: AC
  Administered 2017-06-13: 5 mg via ORAL
  Filled 2017-06-13: qty 1

## 2017-06-13 MED ORDER — ROCURONIUM BROMIDE 50 MG/5ML IV SOLN
INTRAVENOUS | Status: AC
  Filled 2017-06-13: qty 2

## 2017-06-13 MED ORDER — VITAMIN D (ERGOCALCIFEROL) 1.25 MG (50000 UNIT) PO CAPS
50000.0000 [IU] | ORAL_CAPSULE | ORAL | Status: DC
  Administered 2017-06-17: 50000 [IU] via ORAL
  Filled 2017-06-13: qty 1

## 2017-06-13 MED ORDER — SUGAMMADEX SODIUM 200 MG/2ML IV SOLN
INTRAVENOUS | Status: DC | PRN
  Administered 2017-06-13: 300 mg via INTRAVENOUS

## 2017-06-13 MED ORDER — PROPOFOL 10 MG/ML IV BOLUS
INTRAVENOUS | Status: DC | PRN
  Administered 2017-06-13: 110 mg via INTRAVENOUS
  Administered 2017-06-13: 20 mg via INTRAVENOUS
  Administered 2017-06-13: 50 mg via INTRAVENOUS

## 2017-06-13 MED ORDER — OXYCODONE HCL 5 MG/5ML PO SOLN: 5.0000 mg | Freq: Once | ORAL | Status: AC | PRN

## 2017-06-13 MED ORDER — KCL IN DEXTROSE-NACL 10-5-0.45 MEQ/L-%-% IV SOLN
INTRAVENOUS | Status: DC
  Administered 2017-06-13: 02:00:00 via INTRAVENOUS
  Filled 2017-06-13 (×2): qty 1000

## 2017-06-13 SURGICAL SUPPLY — 34 items
BLADE 15 SAFETY STRL DISP (BLADE) ×3 IMPLANT
BNDG GAUZE ELAST 4 BULKY (GAUZE/BANDAGES/DRESSINGS) ×3 IMPLANT
CANISTER SUCT 3000ML PPV (MISCELLANEOUS) IMPLANT
COVER SURGICAL LIGHT HANDLE (MISCELLANEOUS) ×3 IMPLANT
DRAIN PENROSE 1/2X12 LTX STRL (WOUND CARE) ×6 IMPLANT
DRSG PAD ABDOMINAL 8X10 ST (GAUZE/BANDAGES/DRESSINGS) IMPLANT
ELECT CAUTERY BLADE 6.4 (BLADE) ×3 IMPLANT
ELECT REM PT RETURN 9FT ADLT (ELECTROSURGICAL) ×3
ELECTRODE REM PT RTRN 9FT ADLT (ELECTROSURGICAL) ×1 IMPLANT
GAUZE SPONGE 4X4 12PLY STRL (GAUZE/BANDAGES/DRESSINGS) IMPLANT
GAUZE SPONGE 4X4 12PLY STRL LF (GAUZE/BANDAGES/DRESSINGS) ×3 IMPLANT
GAUZE SPONGE 4X4 16PLY XRAY LF (GAUZE/BANDAGES/DRESSINGS) ×3 IMPLANT
GLOVE BIO SURGEON STRL SZ 6 (GLOVE) ×3 IMPLANT
GLOVE BIOGEL PI IND STRL 6.5 (GLOVE) ×1 IMPLANT
GLOVE BIOGEL PI INDICATOR 6.5 (GLOVE) ×2
GOWN STRL REUS W/ TWL LRG LVL3 (GOWN DISPOSABLE) ×2 IMPLANT
GOWN STRL REUS W/TWL LRG LVL3 (GOWN DISPOSABLE) ×4
KIT BASIN OR (CUSTOM PROCEDURE TRAY) ×3 IMPLANT
KIT ROOM TURNOVER OR (KITS) ×3 IMPLANT
NS IRRIG 1000ML POUR BTL (IV SOLUTION) ×3 IMPLANT
PACK LITHOTOMY IV (CUSTOM PROCEDURE TRAY) ×3 IMPLANT
PAD ABD 8X10 STRL (GAUZE/BANDAGES/DRESSINGS) ×3 IMPLANT
PAD ARMBOARD 7.5X6 YLW CONV (MISCELLANEOUS) ×3 IMPLANT
PENCIL BUTTON HOLSTER BLD 10FT (ELECTRODE) ×3 IMPLANT
SPONGE LAP 18X18 X RAY DECT (DISPOSABLE) ×3 IMPLANT
SUT ETHILON 2 0 FS 18 (SUTURE) ×3 IMPLANT
SWAB COLLECTION DEVICE MRSA (MISCELLANEOUS) ×3 IMPLANT
SWAB CULTURE ESWAB REG 1ML (MISCELLANEOUS) ×3 IMPLANT
TOWEL OR 17X24 6PK STRL BLUE (TOWEL DISPOSABLE) IMPLANT
TOWEL OR 17X26 10 PK STRL BLUE (TOWEL DISPOSABLE) ×3 IMPLANT
TUBE CONNECTING 12'X1/4 (SUCTIONS) ×1
TUBE CONNECTING 12X1/4 (SUCTIONS) ×2 IMPLANT
UNDERPAD 30X30 INCONTINENT (UNDERPADS AND DIAPERS) ×3 IMPLANT
YANKAUER SUCT BULB TIP NO VENT (SUCTIONS) ×3 IMPLANT

## 2017-06-13 NOTE — Transfer of Care (Signed)
Immediate Anesthesia Transfer of Care Note  Patient: Brandy Hardy  Procedure(s) Performed: IRRIGATION AND DEBRIDEMENT OF MONS PUBIS ABSCESS (N/A Perineum)  Patient Location: PACU  Anesthesia Type:General  Level of Consciousness: awake, alert , oriented and patient cooperative  Airway & Oxygen Therapy: Patient Spontanous Breathing  Post-op Assessment: Report given to RN and Post -op Vital signs reviewed and stable  Post vital signs: Reviewed and stable  Last Vitals:  Vitals:   06/13/17 0230 06/13/17 0932  BP: (!) 106/54 (!) 110/51  Pulse: 76 66  Resp: 18 18  Temp: 36.8 C 36.8 C  SpO2: 95% 99%    Last Pain:  Vitals:   06/13/17 0932  TempSrc: Oral  PainSc:          Complications: No apparent anesthesia complications

## 2017-06-13 NOTE — Progress Notes (Signed)
PROGRESS NOTE  Brandy Hardy ONG:[id number] DOB: 09-30-1955 DOA: 06/12/2017 PCP: Clovis Riley, L.August Saucer, MD  HPI/Recap of past 24 hours: Brandy Hardy is a 62 y.o. 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 Associated with chills and subjective fever. Oncologist prescribed keflex which she took without any improvement and went to her PCP who sent her to the ED. Gen surg was consulted, and pt admitted for further management.  Today, saw pt before surgery, denied any fever/chills, abdominal pain, dysuria, chest pain, N/V/D/C. Planned for I&D today.   Assessment/Plan: Active Problems:   Essential hypertension   T2DM (type 2 diabetes mellitus) (HCC)   Obesity, morbid (HCC)   Abscess of groin, right   Abscess of right groin   Endometrial cancer (HCC)   Vulvar cancer (HCC)  Abscess of the right mons/perineum, recurrent S/P I & D on 06/13/17 Afebrile with no leukocytosis CRP 13.2,  LA 2.82-->0.89 CT showing soft tissue defect, possible fistula to bladder or vagina, and possible osteomyelitis of pubic symphysis Gen surg on board, s/p I&D, will further evaluate ??osteomyelitis  Continue Vancomycin + cefepime IVF Monitor closely  DM type 2 Diet controlled A1c pending Monitor closely  HTN  BP on the soft side Not on any home meds  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 and radiation  Morbid obesity   Code Status: Full   Family Communication: Husband at bedside  Disposition Plan: Once stable and ok with  surgery   Consultants:  Gen surg  Procedures:  S/P I&D on 3/9  Antimicrobials:  IV Vancomycin  IV Cefepime  DVT prophylaxis:  [place] Heparin   Objective: Vitals:   06/13/17 1250 06/13/17 1254 06/13/17 1304 06/13/17 1305  BP:  107/63  112/65  Pulse:  61 60 (!) 58  Resp:  18 20   Temp: (!) 97.3 F (36.3 C)     TempSrc:      SpO2:  98% 98% 98%  Weight:      Height:        Intake/Output Summary (Last 24 hours) at 06/13/2017 1311 Last data filed at 06/13/2017 1245 Gross per 24 hour  Intake 3640.75 ml  Output 30 ml  Net 3610.75 ml   Filed Weights   06/13/17 0230  Weight: 124.7 kg (274 lb 14.6 oz)    Exam:   General:  NAD  Cardiovascular: S1, S2 present  Respiratory: CTA  Abdomen: Obese, NT, ND, BS+  Musculoskeletal: No pedal edema  Skin/GU: Vulva drainage from abscess 3-4cm, non-foul smelling  Psychiatry: Normal    Data Reviewed: CBC: Recent Labs  Lab 06/12/17 1930 06/13/17 0357  WBC 10.5 8.7  NEUTROABS 8.5*  --   HGB 13.8 11.2*  HCT 41.7 34.6*  MCV 97.0 96.9  PLT 288 256   Basic Metabolic Panel: Recent Labs  Lab 06/12/17 1930 06/13/17 0357  NA 138 139  K 3.4* 3.2*  CL 102 104  CO2 23 26  GLUCOSE 107* 124*  BUN 17 13  CREATININE 0.58 0.57  CALCIUM 8.8* 8.1*   GFR: Estimated Creatinine Clearance: 93.1 mL/min (by C-G formula based on SCr of 0.57 mg/dL). Liver Function Tests: Recent Labs  Lab 06/12/17 1930  AST 34  ALT 36  ALKPHOS 101  BILITOT 0.5  PROT 7.2  ALBUMIN 3.1*   No results for input(s): LIPASE, AMYLASE in the last 168 hours. No results for input(s): AMMONIA in the last 168 hours. Coagulation Profile: Recent Labs  Lab 06/12/17 1930  INR 1.05   Cardiac Enzymes: No results for input(s): CKTOTAL, CKMB, CKMBINDEX, TROPONINI in the last 168 hours. BNP (last 3 results) No results for input(s): PROBNP in the last 8760 hours. HbA1C: No results for input(s): HGBA1C in the last 72 hours. CBG: Recent Labs  Lab  06/13/17 0952  GLUCAP 100*   Lipid Profile: No results for input(s): CHOL, HDL, LDLCALC, TRIG, CHOLHDL, LDLDIRECT in the last 72 hours. Thyroid Function Tests: No results for input(s): TSH, T4TOTAL, FREET4, T3FREE, THYROIDAB in the last 72 hours. Anemia Panel: No results for input(s): VITAMINB12, FOLATE, FERRITIN, TIBC, IRON, RETICCTPCT in the last 72 hours. Urine analysis:    Component Value Date/Time   COLORURINE YELLOW 06/12/2017 2213   APPEARANCEUR CLEAR 06/12/2017 2213   LABSPEC 1.010 06/12/2017 2213   PHURINE 6.0 06/12/2017 2213   GLUCOSEU NEGATIVE 06/12/2017 2213   GLUCOSEU NEGATIVE 02/21/2013 0830   HGBUR MODERATE (A) 06/12/2017 2213   BILIRUBINUR NEGATIVE 06/12/2017 2213   KETONESUR 5 (A) 06/12/2017 2213   PROTEINUR NEGATIVE 06/12/2017 2213   UROBILINOGEN 0.2 02/21/2013 0830   NITRITE NEGATIVE 06/12/2017 2213   LEUKOCYTESUR MODERATE (A) 06/12/2017 2213   Sepsis Labs: @LABRCNTIP (procalcitonin:4,lacticidven:4)  ) Recent Results (from the past 240 hour(s))  Surgical pcr screen     Status: None   Collection Time: 06/13/17  3:57 AM  Result Value Ref Range Status   MRSA, PCR NEGATIVE NEGATIVE Final   Staphylococcus aureus NEGATIVE NEGATIVE Final    Comment: (NOTE) The Xpert SA Assay (FDA approved for NASAL specimens in patients 75 years of age and older), is one component of a comprehensive surveillance program. It is not intended to diagnose infection nor to guide or monitor treatment. Performed at [facility], [address] [place], [place] [address]       Studies: Dg Chest 2 View  Result Date: 06/12/2017 CLINICAL DATA:  Possible sepsis. EXAM: CHEST - 2 VIEW COMPARISON:  02/14/2016 and prior chest radiographs FINDINGS: RIGHT IJ Port-A-Cath with tip overlying the SUPERIOR cavoatrial junction again noted. The cardiomediastinal silhouette is unremarkable. There is no evidence of focal airspace disease, pulmonary edema, suspicious pulmonary nodule/mass,  pleural effusion, or pneumothorax. No acute bony abnormalities are identified. IMPRESSION: No active cardiopulmonary disease. Electronically Signed   By: Harmon Pier M.D.   On: 06/12/2017 19:50   Ct Abdomen Pelvis W Contrast  Result Date: 06/12/2017 CLINICAL DATA:  Acute onset of right groin pain. Right groin boil has popped, oozing pus. EXAM: CT ABDOMEN AND PELVIS WITH CONTRAST TECHNIQUE: Multidetector CT imaging of the abdomen and pelvis was performed using the standard protocol following bolus administration of intravenous contrast. CONTRAST:  ISOVUE-300 IOPAMIDOL (ISOVUE-300) INJECTION 61% COMPARISON:  CT of the abdomen and pelvis performed 02/14/2016 FINDINGS: Lower chest: The visualized lung bases are grossly clear. The visualized portions of the mediastinum are unremarkable. Hepatobiliary: The liver is unremarkable in appearance. The gallbladder is unremarkable in appearance. The common bile duct remains normal in caliber. Pancreas: The pancreas is within normal limits. Spleen: The spleen is unremarkable in appearance. Adrenals/Urinary Tract: The adrenal glands are unremarkable in appearance. A small right renal cyst is noted. Mild nonspecific perinephric stranding is noted bilaterally.  There is no evidence of hydronephrosis. No renal or ureteral stones are identified. Stomach/Bowel: The stomach is unremarkable in appearance. The small bowel is within normal limits. The appendix is normal in caliber, without evidence of appendicitis. Mild scattered diverticulosis is noted along the ascending colon, without evidence of diverticulitis. Vascular/Lymphatic: Scattered calcification is seen along the abdominal aorta and its branches. The abdominal aorta is otherwise grossly unremarkable. The inferior vena cava is grossly unremarkable. Small retroperitoneal nodes are likely within normal limits. No pelvic sidewall lymphadenopathy is identified. Reproductive: Soft tissue inflammation is noted about the  bladder, with asymmetric right-sided bladder wall thickening. There is an unusual pattern of air about the level of the urethra and vaginal vault. The patient's right inguinal soft tissue defect demonstrates diffuse surrounding soft tissue inflammation and underlying fluid. Trace associated air tracks about the right pubic rami and pubic symphysis, with multiple erosions about the pubic symphysis and inferior pubic rami bilaterally, compatible with diffuse osteomyelitis. There is associated diastasis of the pubic symphysis. A fistulous connection from the soft tissue defect to the bladder or urethra/vaginal vault cannot be excluded. Other: Soft tissue inflammation extends about the anterior lower abdominal wall. Musculoskeletal: No acute osseous abnormalities are identified. The visualized musculature is unremarkable in appearance. IMPRESSION: 1. Right inguinal soft tissue defect may have a fistulous connection to the bladder or urethra/vaginal vault, not well characterized on this study. Underlying fluid noted within the soft tissue defect. Asymmetric right-sided bladder wall thickening. Unusual pattern of air about the level of the urethra and vaginal vault may reflect an underlying fistula. Minimal air tracking about the right pubic rami and pubic symphysis. 2. Diastasis of the pubic symphysis, with multiple erosions noted about the pubic symphysis and inferior pubic rami bilaterally, compatible with diffuse osteomyelitis. 3. Soft tissue inflammation extends about the anterior lower abdominal wall. 4. Mild scattered diverticulosis along the ascending colon, without evidence of diverticulitis. 5. Small right renal cyst. Aortic Atherosclerosis (ICD10-I70.0). Electronically Signed   By: Roanna Raider M.D.   On: 06/12/2017 23:50    Scheduled Meds: . [MAR Hold] heparin  5,000 Units Subcutaneous Q8H  . [MAR Hold] Vitamin D (Ergocalciferol)  50,000 Units Oral Weekly    Continuous Infusions: . [MAR Hold]  ceFEPime (MAXIPIME) IV 2 g (06/13/17 0930)  . dextrose 5 % and 0.45 % NaCl with KCl 10 mEq/L 125 mL/hr at 06/13/17 0152  . lactated ringers 10 mL/hr at 06/13/17 1037  . [MAR Hold] vancomycin       LOS: 0 days     Briant Cedar, MD [facility]   If 7PM-7AM, please contact night-coverage [email or web address] Password [email or web address] 06/13/2017, 1:11 PM

## 2017-06-13 NOTE — Consult Note (Signed)
Reason for Consult:Open, recurrent pelvic infection with likely osteomyelitis Referring Physician: Oline Belk is an 62 y.o. female.  HPI: Patient with a prior history of pelvic and perineal necrotizing fasciitis and osteomyelitis, now with 4 day history of drainage from new open wound in her right groin draining bloody, non-foul smelling fluid, some pus, but I couldnot visualize any today.  Past Medical History:  Diagnosis Date  . 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 Endometrial cancer ([place])    with recurrance at introitus   . History of prediabetes   . 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 Renal disorder     Past Surgical History:  Procedure Laterality Date  . ABDOMINAL HYSTERECTOMY    . I&D EXTREMITY Right 02/20/2016   Procedure: IRRIGATION AND DEBRIDEMENT EXTREMITY RIGHT UPPER THIGH;  Surgeon: Fanny Skates, MD;  Location: [place] [facility];  Service: General;  Laterality: Right;  . IRRIGATION AND DEBRIDEMENT ABSCESS Right 02/16/2016   Procedure: IRRIGATION AND DEBRIDEMENT RIGHT GROIN ABSCESS, FULL THICKNESS DEBRIDMENT RIGHT GROIN INCLUDING > 40CM SQUARE.;  Surgeon: Arta Bruce Kinsinger, MD;  Location: [facility];  Service: General;  Laterality: Right;  . IRRIGATION AND DEBRIDEMENT ABSCESS Right 02/20/2016   Procedure: IRRIGATION AND DEBRIDEMENT ABSCESS;  Surgeon: Fanny Skates, MD;  Location: [facility];  Service: General;  Laterality: Right;  Perii-Vaginal Abscess    Family History  Problem Relation Age of Onset  . Hypertension Mother   . Heart disease Father 38       Expired  . Diabetes Neg Hx     Social History:  reports that  has never smoked. she has never used smokeless tobacco. She reports that she does not drink alcohol or use drugs.  Allergies: No Known Allergies  Medications: I have reviewed the patient's current medications.  Results for orders placed or performed during the hospital encounter of 06/12/17 (from the past 48 hour(s))   Comprehensive metabolic panel     Status: Abnormal   Collection Time: 06/12/17  7:30 PM  Result Value Ref Range   Sodium 138 135 - 145 mmol/L   Potassium 3.4 (L) 3.5 - 5.1 mmol/L   Chloride 102 101 - 111 mmol/L   CO2 23 22 - 32 mmol/L   Glucose, Bld 107 (H) 65 - 99 mg/dL   BUN 17 6 - 20 mg/dL   Creatinine, Ser 0.58 0.44 - 1.00 mg/dL   Calcium 8.8 (L) 8.9 - 10.3 mg/dL   Total Protein 7.2 6.5 - 8.1 g/dL   Albumin 3.1 (L) 3.5 - 5.0 g/dL   AST 34 15 - 41 U/L   ALT 36 14 - 54 U/L   Alkaline Phosphatase 101 38 - 126 U/L   Total Bilirubin 0.5 0.3 - 1.2 mg/dL   GFR calc non Af Amer >60 >60 mL/min   GFR calc Af Amer >60 >60 mL/min    Comment: (NOTE) The eGFR has been calculated using the CKD EPI equation. This calculation has not been validated in all clinical situations. eGFR's persistently <60 mL/min signify possible Chronic Kidney Disease.    Anion gap 13 5 - 15    Comment: Performed at [facility] [address], [place], [place] [address]  CBC with Differential     Status: Abnormal   Collection Time: 06/12/17  7:30 PM  Result Value Ref Range   WBC 10.5 4.0 - 10.5 K/uL   RBC 4.30 3.87 - 5.11 MIL/uL  Hemoglobin 13.8 12.0 - 15.0 g/dL   HCT 41.7 36.0 - 46.0 %   MCV 97.0 78.0 - 100.0 fL   MCH 32.1 26.0 - 34.0 pg   MCHC 33.1 30.0 - 36.0 g/dL   RDW 14.6 11.5 - 15.5 %   Platelets 288 150 - 400 K/uL   Neutrophils Relative % 80 %   Neutro Abs 8.5 (H) 1.7 - 7.7 K/uL   Lymphocytes Relative 13 %   Lymphs Abs 1.4 0.7 - 4.0 K/uL   Monocytes Relative 6 %   Monocytes Absolute 0.6 0.1 - 1.0 K/uL   Eosinophils Relative 1 %   Eosinophils Absolute 0.1 0.0 - 0.7 K/uL   Basophils Relative 0 %   Basophils Absolute 0.0 0.0 - 0.1 K/uL    Comment: Performed at [facility] [address], [place], [place] [address]  Protime-INR     Status: None   Collection Time: 06/12/17  7:30 PM  Result Value Ref Range   Prothrombin Time 13.6 11.4 - 15.2 seconds   INR 1.05     Comment:  Performed at [facility] [address] [place], [place] [address]  I-Stat CG4 Lactic Acid, ED     Status: Abnormal   Collection Time: 06/12/17  8:17 PM  Result Value Ref Range   Lactic Acid, Venous 2.82 (HH) 0.5 - 1.9 mmol/L   Comment NOTIFIED PHYSICIAN   I-Stat CG4 Lactic Acid, ED     Status: None   Collection Time: 06/12/17 10:10 PM  Result Value Ref Range   Lactic Acid, Venous 0.89 0.5 - 1.9 mmol/L  Urinalysis, Routine w reflex microscopic     Status: Abnormal   Collection Time: 06/12/17 10:13 PM  Result Value Ref Range   Color, Urine YELLOW YELLOW   APPearance CLEAR CLEAR   Specific Gravity, Urine 1.010 1.005 - 1.030   pH 6.0 5.0 - 8.0   Glucose, UA NEGATIVE NEGATIVE mg/dL   Hgb urine dipstick MODERATE (A) NEGATIVE   Bilirubin Urine NEGATIVE NEGATIVE   Ketones, ur 5 (A) NEGATIVE mg/dL   Protein, ur NEGATIVE NEGATIVE mg/dL   Nitrite NEGATIVE NEGATIVE   Leukocytes, UA MODERATE (A) NEGATIVE   RBC / HPF 6-30 0 - 5 RBC/hpf   WBC, UA 6-30 0 - 5 WBC/hpf   Bacteria, UA RARE (A) NONE SEEN   Squamous Epithelial / LPF 0-5 (A) NONE SEEN   Mucus PRESENT     Comment: Performed at [facility], [address] [place], [place] [address]  CBC     Status: Abnormal   Collection Time: 06/13/17  3:57 AM  Result Value Ref Range   WBC 8.7 4.0 - 10.5 K/uL   RBC 3.57 (L) 3.87 - 5.11 MIL/uL   Hemoglobin 11.2 (L) 12.0 - 15.0 g/dL   HCT 34.6 (L) 36.0 - 46.0 %   MCV 96.9 78.0 - 100.0 fL   MCH 31.4 26.0 - 34.0 pg   MCHC 32.4 30.0 - 36.0 g/dL   RDW 14.2 11.5 - 15.5 %   Platelets 256 150 - 400 K/uL    Comment: Performed at [facility], [place] [address] [place], [place] [address]  Basic metabolic panel     Status: Abnormal   Collection Time: 06/13/17  3:57 AM  Result Value Ref Range   Sodium 139 135 - 145 mmol/L   Potassium 3.2 (L) 3.5 - 5.1 mmol/L   Chloride 104 101 - 111 mmol/L   CO2 26  22 - 32 mmol/L   Glucose, Bld 124 (H) 65 - 99 mg/dL   BUN 13 6 - 20 mg/dL    Creatinine, Ser 0.57 0.44 - 1.00 mg/dL   Calcium 8.1 (L) 8.9 - 10.3 mg/dL   GFR calc non Af Amer >60 >60 mL/min   GFR calc Af Amer >60 >60 mL/min    Comment: (NOTE) The eGFR has been calculated using the CKD EPI equation. This calculation has not been validated in all clinical situations. eGFR's persistently <60 mL/min signify possible Chronic Kidney Disease.    Anion gap 9 5 - 15    Comment: Performed at [facility] [address] [place], [place] [address]  C-reactive protein     Status: Abnormal   Collection Time: 06/13/17  3:57 AM  Result Value Ref Range   CRP 13.2 (H) <1.0 mg/dL    Comment: Performed at [facility] [address] [place], [place] [address]  Type and screen [place]     Status: None   Collection Time: 06/13/17  4:10 AM  Result Value Ref Range   ABO/RH(D) O POS    Antibody Screen NEG    Sample Expiration      06/16/2017 Performed at [facility], [place] [address], [place], [place] [address]   ABO/Rh     Status: None (Preliminary result)   Collection Time: 06/13/17  4:10 AM  Result Value Ref Range   ABO/RH(D)      O POS Performed at [facility] [address] [place], [place] [address]     Dg Chest 2 View  Result Date: 06/12/2017 CLINICAL DATA:  Possible sepsis. EXAM: CHEST - 2 VIEW COMPARISON:  02/14/2016 and prior chest radiographs FINDINGS: RIGHT IJ Port-A-Cath with tip overlying the SUPERIOR cavoatrial junction again noted. The cardiomediastinal silhouette is unremarkable. There is no evidence of focal airspace disease, pulmonary edema, suspicious pulmonary nodule/mass, pleural effusion, or pneumothorax. No acute bony abnormalities are identified. IMPRESSION: No active cardiopulmonary disease. Electronically Signed   By: Margarette Canada M.D.   On: 06/12/2017 19:50   Ct Abdomen Pelvis W Contrast  Result Date: 06/12/2017 CLINICAL DATA:  Acute onset of right groin pain. Right groin boil has popped, oozing pus.  EXAM: CT ABDOMEN AND PELVIS WITH CONTRAST TECHNIQUE: Multidetector CT imaging of the abdomen and pelvis was performed using the standard protocol following bolus administration of intravenous contrast. CONTRAST:  171m ISOVUE-300 IOPAMIDOL (ISOVUE-300) INJECTION 61% COMPARISON:  CT of the abdomen and pelvis performed 02/14/2016 FINDINGS: Lower chest: The visualized lung bases are grossly clear. The visualized portions of the mediastinum are unremarkable. Hepatobiliary: The liver is unremarkable in appearance. The gallbladder is unremarkable in appearance. The common bile duct remains normal in caliber. Pancreas: The pancreas is within normal limits. Spleen: The spleen is unremarkable in appearance. Adrenals/Urinary Tract: The adrenal glands are unremarkable in appearance. A small right renal cyst is noted. Mild nonspecific perinephric stranding is noted bilaterally. There is no evidence of hydronephrosis. No renal or ureteral stones are identified. Stomach/Bowel: The stomach is unremarkable in appearance. The small bowel is within normal limits. The appendix is normal in caliber, without evidence of appendicitis. Mild scattered diverticulosis is noted along the ascending colon, without evidence of diverticulitis. Vascular/Lymphatic: Scattered calcification is seen along the abdominal aorta and its branches. The abdominal aorta is otherwise grossly unremarkable. The inferior vena cava is grossly unremarkable. Small retroperitoneal nodes are likely within normal limits. No pelvic  sidewall lymphadenopathy is identified. Reproductive: Soft tissue inflammation is noted about the bladder, with asymmetric right-sided bladder wall thickening. There is an unusual pattern of air about the level of the urethra and vaginal vault. The patient's right inguinal soft tissue defect demonstrates diffuse surrounding soft tissue inflammation and underlying fluid. Trace associated air tracks about the right pubic rami and pubic  symphysis, with multiple erosions about the pubic symphysis and inferior pubic rami bilaterally, compatible with diffuse osteomyelitis. There is associated diastasis of the pubic symphysis. A fistulous connection from the soft tissue defect to the bladder or urethra/vaginal vault cannot be excluded. Other: Soft tissue inflammation extends about the anterior lower abdominal wall. Musculoskeletal: No acute osseous abnormalities are identified. The visualized musculature is unremarkable in appearance. IMPRESSION: 1. Right inguinal soft tissue defect may have a fistulous connection to the bladder or urethra/vaginal vault, not well characterized on this study. Underlying fluid noted within the soft tissue defect. Asymmetric right-sided bladder wall thickening. Unusual pattern of air about the level of the urethra and vaginal vault may reflect an underlying fistula. Minimal air tracking about the right pubic rami and pubic symphysis. 2. Diastasis of the pubic symphysis, with multiple erosions noted about the pubic symphysis and inferior pubic rami bilaterally, compatible with diffuse osteomyelitis. 3. Soft tissue inflammation extends about the anterior lower abdominal wall. 4. Mild scattered diverticulosis along the ascending colon, without evidence of diverticulitis. 5. Small right renal cyst. Aortic Atherosclerosis (ICD10-I70.0). Electronically Signed   By: Garald Balding M.D.   On: 06/12/2017 23:50    Review of Systems  Constitutional: Positive for chills and fever.  Musculoskeletal:       Pelvic pain with drainage  All other systems reviewed and are negative.  Blood pressure (!) 106/54, pulse 76, temperature 98.3 F (36.8 C), temperature source Oral, resp. rate 18, height '5\' 2"'  (1.575 m), weight 124.7 kg (274 lb 14.6 oz), SpO2 95 %. Physical Exam  Constitutional: She is oriented to person, place, and time.  Morbidly obese  HENT:  Head: Normocephalic and atraumatic.  Eyes: EOM are normal. Pupils are  equal, round, and reactive to light.  Neck: Normal range of motion. Neck supple.  Cardiovascular: Normal rate and regular rhythm.  Respiratory: Effort normal and breath sounds normal.  GI: Soft. Bowel sounds are normal.  Genitourinary: Vaginal discharge found.  Genitourinary Comments: Has vaginal and vulvar drainage from ulcerated and open area with erythema of the mons pubis.  Open area about 3-4 cm draining non-foul-smelling fluid  Musculoskeletal: Normal range of motion.  Neurological: She is alert and oriented to person, place, and time. She has normal reflexes.  Skin: Skin is warm and dry.  Psychiatric: She has a normal mood and affect. Her behavior is normal. Thought content normal.    Assessment/Plan: Open woundof the right groin with drainage. Long history of infection and osteomeylitis. Will need operative drainage and debridement in the near future.  Brandy Hardy 06/13/2017, 6:49 AM

## 2017-06-13 NOTE — Progress Notes (Signed)
Patient admited from ED at this time. Patient is alert, ambulated from stretcher  to bed. Patient denies pain. No sign of distress.

## 2017-06-13 NOTE — Op Note (Signed)
Operative Note  Brandy Hardy  [id number]  [id number]  06/13/2017   Surgeon: Vikki Ports A ConnorMD  Assistant: Or staff  Procedure performed: incision and debridement of recurrent soft tissue infection of right mons/perineum, examination under anesthesia  Preop diagnosis: recurrent abscess of mons/perineum Post-op diagnosis/intraop findings: same, no obvious communication with vagina  Specimens: no Retained items: Penrose x 2, kerlix packing EBL: minimal cc Complications: none  Description of procedure: After obtaining informed consent the patient was taken to the operating room and placed supine on operating room table wheregeneral endotracheal anesthesia was initiated, preoperative antibiotics were administered, SCDs applied, and a formal timeout was performed. She was placed in lithotomy position with all pressure points appropriately padded. The perineum was prepped and draped in usual sterile fashion using Betadine. There was a approximately 1 cm opening in the skin in the mons just lateral to the labia majora through which she had been having copious amounts of purulent drainage prior to the hospital. I was able to insert my first digit through this and gently probe the wound. This tracked anterior superiorly and laterally along the course of her prior debridement on the mons pubis. The wound was extended along the line of her prior scar using the cautery and then further probed bluntly to confirm no retained loculations. There was no evidence of necrotizing infection, all the tissues were beefy red, bleeding and viable, and it seems that most of the fluid had already drained prior to this procedure. The vaginal vault was examined, she has significant scarring here from her prior surgery and radiation such that really the vagina terminates within about 2 cm. I did not find any overt fistulous opening into the visible vaginal wall. There was not any tracking medially along the mons or  towards the urethra or vagina. It did track inferiorly and laterally into the soft tissues of the proximal thigh. Once we had identified the complete abscess cavity and widely opened it, I made 2 counterincisions, one on the medial thigh and one in the superior lateral mons. 2 separate Penrose's were brought through each of these wounds to exit in the main wound. These Penrose's were sutured to themselves with 2-0 nylon. Hemostasis was ensured within the wound and it was packed with a saline moistened Kerlix. Dry dressings were applied. The patient was returned to the supine position. The patient was then awakened, extubated and taken to PACU in stable condition.   All counts were correct at the completion of the case.

## 2017-06-13 NOTE — H&P (Signed)
History and Physical    Brandy Hardy WFU:[id number] DOB: 1955-10-26 DOA: 06/12/2017  PCP: Clovis Riley, L.August Saucer, MD  Patient coming from: home   Chief Complaint: abscess  HPI: Brandy Hardy is a 62 y.o. 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 On Monday noticed a somewhat tender red lump right mons/groin. Pain worsened the following day. Took a photo and sent to her oncologist, who prescribed keflex. Continued to worsen in terms of pain and swelling, and yesterday began draining purulent bloody fluid. Presented to her pcp today who and says copious amounts of purulent fluid encountered when that area examined. Patient has reported subjective fevers and chills at home though today says feeling well other than some pain in groin.  ED Course: clindamycin, CT, consults, 1 L NS, labs  Review of Systems: As per HPI otherwise 10 point review of systems negative.    Past Medical History:  Diagnosis Date  . Cervical adenocarcinoma (HCC)    stage II  . Endometrial cancer (HCC)    with recurrance at introitus   . History of prediabetes   . 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 Renal disorder     Past Surgical History:  Procedure Laterality Date  . ABDOMINAL HYSTERECTOMY    . I&D EXTREMITY Right 02/20/2016   Procedure: IRRIGATION AND DEBRIDEMENT EXTREMITY RIGHT UPPER THIGH;  Surgeon: Claud Kelp, MD;  Location: [facility] OR;  Service: General;  Laterality: Right;  . IRRIGATION AND DEBRIDEMENT ABSCESS Right 02/16/2016   Procedure: IRRIGATION AND DEBRIDEMENT RIGHT GROIN ABSCESS, FULL THICKNESS DEBRIDMENT RIGHT  GROIN INCLUDING > 40CM SQUARE.;  Surgeon: De Blanch Kinsinger, MD;  Location: [facility] OR;  Service: General;  Laterality: Right;  . IRRIGATION AND DEBRIDEMENT ABSCESS Right 02/20/2016   Procedure: IRRIGATION AND DEBRIDEMENT ABSCESS;  Surgeon: Claud Kelp, MD;  Location: [facility] OR;  Service: General;  Laterality: Right;  Perii-Vaginal Abscess     reports that  has never smoked. she has never used smokeless tobacco. She reports that she does not drink alcohol or use drugs.  No Known Allergies  Family History  Problem Relation Age of Onset  . Hypertension Mother   . Heart disease Father 49       Expired  . Diabetes Neg Hx     Prior to Admission medications   Medication Sig Start Date End Date Taking? Authorizing Provider  atorvastatin (LIPITOR) 40 MG tablet TAKE 1 TABLET DAILY Patient taking differently: TAKE 40 mg  TABLET DAILY 05/19/16  Yes Reather Littler, MD  cephALEXin (KEFLEX) 500 MG capsule Take 500 mg by mouth 4 (four) times daily.  03/05/16  Yes [provider]  Naproxen Sodium (ALEVE) 220 MG CAPS Take 220 mg by mouth as needed.   Yes [provider]  oxyCODONE (OXY IR/ROXICODONE) 5 MG immediate release tablet Take 1 tablet (5 mg total) by mouth every 6 (six) hours as needed for severe pain. 04/29/16  Yes Ranelle Oyster, MD  polyethylene glycol [facility] / Ethelene Hal) packet Take 17 g by mouth daily.   Yes [provider]  PREMARIN vaginal cream Place 1 application vaginally as needed. 04/30/17  Yes [provider]  Probiotic Product (PROBIOTIC DAILY PO) Take 1  tablet by mouth daily.    Yes [provider]  Vitamin D, Ergocalciferol, (DRISDOL) 50000 units CAPS capsule TAKE 1 CAPSULE WEEKLY 05/19/16  Yes Reather Littler, MD  ALPRAZolam Prudy Feeler) 0.25 MG tablet Take 1 tablet (0.25 mg total) by mouth at bedtime as needed for anxiety. Patient not taking: Reported on 06/12/2017 05/30/16   Ranelle Oyster, MD  Multiple Vitamin (MULTIVITAMIN WITH MINERALS) TABS  tablet Take 1 tablet by mouth daily. Patient not taking: Reported on 06/12/2017 03/06/16   Love, Evlyn Kanner, PA-C  vitamin C (VITAMIN C) 500 MG tablet Take 1 tablet (500 mg total) by mouth 2 (two) times daily. Patient not taking: Reported on 06/12/2017 03/05/16   Jerene Pitch    Physical Exam: Vitals:   06/12/17 2230 06/12/17 2245 06/12/17 2300 06/12/17 2343  BP: (!) 124/45 (!) 109/47 (!) 101/59 (!) 108/48  Pulse: 77 69 62 67  Resp:      Temp:      TempSrc:      SpO2: 98% 99% 100% 100%    Constitutional: No acute distress Head: Atraumatic Eyes: Conjunctiva clear ENM: Moist mucous membranes. Normal dentition.  Neck: Supple Respiratory: Clear to auscultation bilaterally, no wheezing/rales/rhonchi. Normal respiratory effort. No accessory muscle use. . Cardiovascular: Regular rate and rhythm. No murmurs/rubs/gallops. Abdomen: Non-tender, non-distended. No masses. No rebound or guarding. Positive bowel sounds. Musculoskeletal: No joint deformity upper and lower extremities. Normal ROM, no contractures. Normal muscle tone.  Skin: No rashes, lesions, or ulcers.  Extremities: No peripheral edema. Palpable peripheral pulses. Neurologic: Alert, moving all 4 extremities. Psychiatric: Normal insight and judgement.   Labs on Admission: I have personally reviewed following labs and imaging studies  CBC: Recent Labs  Lab 06/12/17 1930  WBC 10.5  NEUTROABS 8.5*  HGB 13.8  HCT 41.7  MCV 97.0  PLT 288   Basic Metabolic Panel: Recent Labs  Lab 06/12/17 1930  NA 138  K 3.4*  CL 102  CO2 23  GLUCOSE 107*  BUN 17  CREATININE 0.58  CALCIUM 8.8*   GFR: CrCl cannot be calculated (Unknown ideal weight.). Liver Function Tests: Recent Labs  Lab 06/12/17 1930  AST 34  ALT 36  ALKPHOS 101  BILITOT 0.5  PROT 7.2  ALBUMIN 3.1*   No results for input(s): LIPASE, AMYLASE in the last 168 hours. No results for input(s): AMMONIA in the last 168 hours. Coagulation  Profile: Recent Labs  Lab 06/12/17 1930  INR 1.05   Cardiac Enzymes: No results for input(s): CKTOTAL, CKMB, CKMBINDEX, TROPONINI in the last 168 hours. BNP (last 3 results) No results for input(s): PROBNP in the last 8760 hours. HbA1C: No results for input(s): HGBA1C in the last 72 hours. CBG: No results for input(s): GLUCAP in the last 168 hours. Lipid Profile: No results for input(s): CHOL, HDL, LDLCALC, TRIG, CHOLHDL, LDLDIRECT in the last 72 hours. Thyroid Function Tests: No results for input(s): TSH, T4TOTAL, FREET4, T3FREE, THYROIDAB in the last 72 hours. Anemia Panel: No results for input(s): VITAMINB12, FOLATE, FERRITIN, TIBC, IRON, RETICCTPCT in the last 72 hours. Urine analysis:    Component Value Date/Time   COLORURINE YELLOW 06/12/2017 2213   APPEARANCEUR CLEAR 06/12/2017 2213   LABSPEC 1.010 06/12/2017 2213   PHURINE 6.0 06/12/2017 2213   GLUCOSEU NEGATIVE 06/12/2017 2213   GLUCOSEU NEGATIVE 02/21/2013 0830   HGBUR MODERATE (A) 06/12/2017 2213   BILIRUBINUR NEGATIVE 06/12/2017 2213   KETONESUR 5 (A) 06/12/2017 2213   PROTEINUR NEGATIVE 06/12/2017 2213   UROBILINOGEN  0.2 02/21/2013 0830   NITRITE NEGATIVE 06/12/2017 2213   LEUKOCYTESUR MODERATE (A) 06/12/2017 2213    Radiological Exams on Admission: Dg Chest 2 View  Result Date: 06/12/2017 CLINICAL DATA:  Possible sepsis. EXAM: CHEST - 2 VIEW COMPARISON:  02/14/2016 and prior chest radiographs FINDINGS: RIGHT IJ Port-A-Cath with tip overlying the SUPERIOR cavoatrial junction again noted. The cardiomediastinal silhouette is unremarkable. There is no evidence of focal airspace disease, pulmonary edema, suspicious pulmonary nodule/mass, pleural effusion, or pneumothorax. No acute bony abnormalities are identified. IMPRESSION: No active cardiopulmonary disease. Electronically Signed   By: Harmon Pier M.D.   On: 06/12/2017 19:50   Ct Abdomen Pelvis W Contrast  Result Date: 06/12/2017 CLINICAL DATA:  Acute onset of  right groin pain. Right groin boil has popped, oozing pus. EXAM: CT ABDOMEN AND PELVIS WITH CONTRAST TECHNIQUE: Multidetector CT imaging of the abdomen and pelvis was performed using the standard protocol following bolus administration of intravenous contrast. CONTRAST:  ISOVUE-300 IOPAMIDOL (ISOVUE-300) INJECTION 61% COMPARISON:  CT of the abdomen and pelvis performed 02/14/2016 FINDINGS: Lower chest: The visualized lung bases are grossly clear. The visualized portions of the mediastinum are unremarkable. Hepatobiliary: The liver is unremarkable in appearance. The gallbladder is unremarkable in appearance. The common bile duct remains normal in caliber. Pancreas: The pancreas is within normal limits. Spleen: The spleen is unremarkable in appearance. Adrenals/Urinary Tract: The adrenal glands are unremarkable in appearance. A small right renal cyst is noted. Mild nonspecific perinephric stranding is noted bilaterally. There is no evidence of hydronephrosis. No renal or ureteral stones are identified. Stomach/Bowel: The stomach is unremarkable in appearance. The small bowel is within normal limits. The appendix is normal in caliber, without evidence of appendicitis. Mild scattered diverticulosis is noted along the ascending colon, without evidence of diverticulitis. Vascular/Lymphatic: Scattered calcification is seen along the abdominal aorta and its branches. The abdominal aorta is otherwise grossly unremarkable. The inferior vena cava is grossly unremarkable. Small retroperitoneal nodes are likely within normal limits. No pelvic sidewall lymphadenopathy is identified. Reproductive: Soft tissue inflammation is noted about the bladder, with asymmetric right-sided bladder wall thickening. There is an unusual pattern of air about the level of the urethra and vaginal vault. The patient's right inguinal soft tissue defect demonstrates diffuse surrounding soft tissue inflammation and underlying fluid. Trace  associated air tracks about the right pubic rami and pubic symphysis, with multiple erosions about the pubic symphysis and inferior pubic rami bilaterally, compatible with diffuse osteomyelitis. There is associated diastasis of the pubic symphysis. A fistulous connection from the soft tissue defect to the bladder or urethra/vaginal vault cannot be excluded. Other: Soft tissue inflammation extends about the anterior lower abdominal wall. Musculoskeletal: No acute osseous abnormalities are identified. The visualized musculature is unremarkable in appearance. IMPRESSION: 1. Right inguinal soft tissue defect may have a fistulous connection to the bladder or urethra/vaginal vault, not well characterized on this study. Underlying fluid noted within the soft tissue defect. Asymmetric right-sided bladder wall thickening. Unusual pattern of air about the level of the urethra and vaginal vault may reflect an underlying fistula. Minimal air tracking about the right pubic rami and pubic symphysis. 2. Diastasis of the pubic symphysis, with multiple erosions noted about the pubic symphysis and inferior pubic rami bilaterally, compatible with diffuse osteomyelitis. 3. Soft tissue inflammation extends about the anterior lower abdominal wall. 4. Mild scattered diverticulosis along the ascending colon, without evidence of diverticulitis. 5. Small right renal cyst. Aortic Atherosclerosis (ICD10-I70.0). Electronically Signed   By: Leotis Shames  Chang M.D.   On: 06/12/2017 23:50    Assessment/Plan Active Problems:   Essential hypertension   T2DM (type 2 diabetes mellitus) (HCC)   Obesity, morbid (HCC)   Abscess of groin, right   Abscess of right groin    # Abscess of the right mons/groin - now open and has drained copious fluid. Afebrile, bp low-normal (family says hx of low-normal bp), mentating clearly, WBCs not elevated. CT showing soft tissue defect, possible fistula to bladder or vagina, and possible osteomyelitis. Given hx  osteomyelitis in this region as well as radiation bone injury unclear currently whether truly represents osteo. Gyn consulted, recommended gen surg consult, gen surg dr. Lindie Spruce consulted, recommending admission for IV antibiotics, will see in AM. - vancomycin/cefepime, pharmacy to dose - no current signs sepsis or necrotizing fasciitis - ctm closely - f/u CRP - appreciate gen surg recs - npo for possible surgical intervention, iv fluids ordered  # DM - pt says now diet controlled after sig weight loss. Initial glucose wnl - f/u a1c - daily glucose check - holding on SSI  # HTN - pt says resolved with weight loss, currently on no antihypertensives, here bp low normal  # Adenocarcinoma of endometrium and vagina - followed by [facility] gyn/onc. S/p chemotherapy and radiation  DVT prophylaxis: heparin Code Status: full  Family Communication: husband chuck Hofbauer [phone]  Disposition Plan: tbd  Consults called: gyn (arnold), gen surg (wyatt)  Admission status: med/surg    Silvano Bilis MD [facility] Pager [phone]  If 7PM-7AM, please contact night-coverage [email or web address] Password [email or web address]  06/13/2017, 1:06 AM

## 2017-06-13 NOTE — Anesthesia Procedure Notes (Signed)
Procedure Name: Intubation Date/Time: 06/13/2017 11:35 AM Performed by: White, Amedeo Plenty, CRNA Pre-anesthesia Checklist: Patient identified, Emergency Drugs available, Suction available and Patient being monitored Patient Re-evaluated:Patient Re-evaluated prior to induction Oxygen Delivery Method: Circle System Utilized Preoxygenation: Pre-oxygenation with 100% oxygen Induction Type: IV induction and Rapid sequence Ventilation: Mask ventilation without difficulty Laryngoscope Size: Mac and 3 Grade View: Grade II Tube type: Oral Tube size: 7.0 mm Number of attempts: 2 Airway Equipment and Method: Stylet,  Oral airway and Bougie stylet Placement Confirmation: ETT inserted through vocal cords under direct vision,  positive ETCO2 and breath sounds checked- equal and bilateral Secured at: 21 cm Tube secured with: Tape Dental Injury: Teeth and Oropharynx as per pre-operative assessment  Difficulty Due To: Difficulty was anticipated and Difficult Airway- due to limited oral opening Future Recommendations: Recommend- induction with short-acting agent, and alternative techniques readily available

## 2017-06-13 NOTE — Progress Notes (Signed)
I discussed CT and exam findings with patient and husband. Will proceed to OR for debridement today. Once infectious process addressed, will need further evaluation for possible osteo/ fistula to bladder/urethra/vagina. Their questions were answered to their satisfaction and the patient agrees to proceed to surgery.

## 2017-06-13 NOTE — Anesthesia Preprocedure Evaluation (Signed)
Anesthesia Evaluation  Patient identified by MRN, date of birth, ID band Patient awake    Reviewed: Allergy & Precautions, NPO status , Patient's Chart, lab work & pertinent test results  History of Anesthesia Complications Negative for: history of anesthetic complications  Airway Mallampati: III  TM Distance: >3 FB Neck ROM: Full    Dental  (+) Teeth Intact, Dental Advisory Given   Pulmonary neg pulmonary ROS,    Pulmonary exam normal breath sounds clear to auscultation       Cardiovascular hypertension, Normal cardiovascular exam Rhythm:Regular Rate:Normal     Neuro/Psych negative neurological ROS     GI/Hepatic negative GI ROS, Neg liver ROS,   Endo/Other  diabetesMorbid obesity  Renal/GU negative Renal ROS     Musculoskeletal negative musculoskeletal ROS (+)   Abdominal   Peds  Hematology  (+) Blood dyscrasia, anemia ,   Anesthesia Other Findings   Reproductive/Obstetrics Cervical cancer s/p chemo/radiation                             Anesthesia Physical Anesthesia Plan  ASA: III  Anesthesia Plan: General   Post-op Pain Management:    Induction: Intravenous  PONV Risk Score and Plan: 3 and Ondansetron and Treatment may vary due to age or medical condition  Airway Management Planned: Oral ETT  Additional Equipment: None  Intra-op Plan:   Post-operative Plan: Extubation in OR  Informed Consent: I have reviewed the patients History and Physical, chart, labs and discussed the procedure including the risks, benefits and alternatives for the proposed anesthesia with the patient or authorized representative who has indicated his/her understanding and acceptance.   Dental advisory given  Plan Discussed with: CRNA and Surgeon  Anesthesia Plan Comments:         Anesthesia Quick Evaluation

## 2017-06-13 NOTE — ED Notes (Signed)
ED Provider at bedside. 

## 2017-06-13 NOTE — Progress Notes (Signed)
Pharmacy Antibiotic Note  Brandy Hardy is a 62 y.o. female admitted on 06/12/2017 with abscess in groin with history of osteomyelitis in same region. Starting empiric antibiotics.   Plan: -Vancomycin 2500 mg IV x1 then 750 mg IV q12h -Cefepime 2 g IV q12h -Monitor renal fx, cultures, check VT at steady state     Temp (24hrs), Avg:99 F (37.2 C), Min:98.9 F (37.2 C), Max:99.1 F (37.3 C)  Recent Labs  Lab 06/12/17 1930 06/12/17 2017 06/12/17 2210  WBC 10.5  --   --   CREATININE 0.58  --   --   LATICACIDVEN  --  2.82* 0.89     Antimicrobials this admission: 3/9 cefepime > 3/9 vancomycin >  Dose adjustments this admission: N/A  Microbiology results: 3/8 blood cx:  Harvel Quale 06/13/2017 1:49 AM

## 2017-06-13 NOTE — Anesthesia Postprocedure Evaluation (Signed)
Anesthesia Post Note  Patient: Brandy Hardy  Procedure(s) Performed: IRRIGATION AND DEBRIDEMENT OF MONS PUBIS ABSCESS (N/A Perineum)     Patient location during evaluation: PACU Anesthesia Type: General Level of consciousness: awake and alert Pain management: pain level controlled Vital Signs Assessment: post-procedure vital signs reviewed and stable Respiratory status: spontaneous breathing, nonlabored ventilation, respiratory function stable and patient connected to nasal cannula oxygen Cardiovascular status: blood pressure returned to baseline and stable Postop Assessment: no apparent nausea or vomiting Anesthetic complications: no    Last Vitals:  Vitals:   06/13/17 1305 06/13/17 1316  BP: 112/65 103/65  Pulse: (!) 58 (!) 57  Resp:  18  Temp:    SpO2: 98%     Last Pain:  Vitals:   06/13/17 1250  TempSrc:   PainSc: 3                  Jmarion Christiano

## 2017-06-14 ENCOUNTER — Encounter (HOSPITAL_COMMUNITY): Payer: Self-pay | Admitting: Surgery

## 2017-06-14 LAB — BASIC METABOLIC PANEL
Anion gap: 7 (ref 5–15)
BUN: 10 mg/dL (ref 6–20)
CALCIUM: 8.2 mg/dL — AB (ref 8.9–10.3)
CO2: 25 mmol/L (ref 22–32)
CREATININE: 0.62 mg/dL (ref 0.44–1.00)
Chloride: 107 mmol/L (ref 101–111)
GFR calc non Af Amer: >60 mL/min (ref >60)
Glucose, Bld: 109 mg/dL — ABNORMAL HIGH (ref 65–99)
Potassium: 3.7 mmol/L (ref 3.5–5.1)
SODIUM: 139 mmol/L (ref 135–145)

## 2017-06-14 LAB — CBC WITH DIFFERENTIAL/PLATELET
BASOS PCT: 0 %
Basophils Absolute: 0 10*3/uL (ref 0.0–0.1)
EOS ABS: 0.2 10*3/uL (ref 0.0–0.7)
Eosinophils Relative: 2 %
HCT: 33.6 % — ABNORMAL LOW (ref 36.0–46.0)
Hemoglobin: 11 g/dL — ABNORMAL LOW (ref 12.0–15.0)
Lymphocytes Relative: 17 %
Lymphs Abs: 1.1 10*3/uL (ref 0.7–4.0)
MCH: 32.3 pg (ref 26.0–34.0)
MCHC: 32.7 g/dL (ref 30.0–36.0)
MCV: 98.5 fL (ref 78.0–100.0)
MONO ABS: 0.6 10*3/uL (ref 0.1–1.0)
MONOS PCT: 9 %
NEUTROS PCT: 72 %
Neutro Abs: 4.7 10*3/uL (ref 1.7–7.7)
Platelets: 278 10*3/uL (ref 150–400)
RBC: 3.41 MIL/uL — ABNORMAL LOW (ref 3.87–5.11)
RDW: 14.7 % (ref 11.5–15.5)
WBC: 6.6 10*3/uL (ref 4.0–10.5)

## 2017-06-14 LAB — HEMOGLOBIN A1C
HEMOGLOBIN A1C: 5.5 % (ref 4.8–5.6)
Mean Plasma Glucose: 111 mg/dL

## 2017-06-14 MED ORDER — SENNOSIDES-DOCUSATE SODIUM 8.6-50 MG PO TABS
1.0000 | ORAL_TABLET | Freq: Two times a day (BID) | ORAL | Status: DC
  Administered 2017-06-14 – 2017-06-19 (×9): 1 via ORAL
  Filled 2017-06-14 (×10): qty 1

## 2017-06-14 MED ORDER — PNEUMOCOCCAL VAC POLYVALENT 25 MCG/0.5ML IJ INJ
0.5000 mL | INJECTION | INTRAMUSCULAR | Status: AC
  Administered 2017-06-16: 0.5 mL via INTRAMUSCULAR
  Filled 2017-06-14: qty 0.5

## 2017-06-14 MED ORDER — MORPHINE SULFATE (PF) 2 MG/ML IV SOLN
1.0000 mg | INTRAVENOUS | Status: DC | PRN
  Administered 2017-06-14: 1 mg via INTRAVENOUS
  Administered 2017-06-14: 2 mg via INTRAVENOUS
  Filled 2017-06-14 (×2): qty 1

## 2017-06-14 MED ORDER — MORPHINE SULFATE (PF) 2 MG/ML IV SOLN
2.0000 mg | INTRAVENOUS | Status: DC | PRN
  Administered 2017-06-14 – 2017-06-18 (×9): 4 mg via INTRAVENOUS
  Filled 2017-06-14: qty 2
  Filled 2017-06-14: qty 1
  Filled 2017-06-14 (×8): qty 2

## 2017-06-14 NOTE — Progress Notes (Signed)
1 Day Post-Op    CC: Right/mons perineum abscess  Subjective: Sitting up in bed eating breakfast.  The Penrose drain site has minimal drainage.  The lower area that is packed is more difficult to see but does not appear to have a great deal of drainage. She will need some pain medicines before we try to take this dressing down and repacked it.  We will plan to do that later this a.m.  Objective: Vital signs in last 24 hours: Temp:  [97.3 F (36.3 C)-99 F (37.2 C)] 99 F (37.2 C) (03/09 2103) Pulse Rate:  [57-70] 70 (03/09 2103) Resp:  [11-20] 16 (03/09 2103) BP: (103-112)/(40-65) 106/40 (03/09 2103) SpO2:  [92 %-99 %] 96 % (03/09 2103) Last BM Date: 06/12/17 462 p.o. 700 IV Voided x2   No BM recorded T-max 99.3, vital signs are stable WBC 6.6, hemoglobin 11, hematocrit 33.6, platelets are normal. BMP is essentially normal glucose is 109.  Intake/Output from previous day: 03/09 0701 - 03/10 0700 In: 1162 [P.O.:462; I.V.:700] Out: 30 [Blood:30] Intake/Output this shift: No intake/output data recorded.  General appearance: alert, cooperative and no distress Skin: As noted above the Penrose drain site has minimal drainage.  Mild erythema and swelling.  Lower site has good deal of swelling but drainage appears clear we will plan to take the dressing down later and repacked this a.m.  Lab Results:  Recent Labs    06/13/17 0357 06/14/17 0446  WBC 8.7 6.6  HGB 11.2* 11.0*  HCT 34.6* 33.6*  PLT 256 278    BMET Recent Labs    06/13/17 0357 06/14/17 0446  NA 139 139  K 3.2* 3.7  CL 104 107  CO2 26 25  GLUCOSE 124* 109*  BUN 13 10  CREATININE 0.57 0.62  CALCIUM 8.1* 8.2*   PT/INR Recent Labs    06/12/17 1930  LABPROT 13.6  INR 1.05    Recent Labs  Lab 06/12/17 1930  AST 34  ALT 36  ALKPHOS 101  BILITOT 0.5  PROT 7.2  ALBUMIN 3.1*     Lipase  No results found for: LIPASE   Medications: . heparin  5,000 Units Subcutaneous Q8H  . [START ON  06/17/2017] Vitamin D (Ergocalciferol)  50,000 Units Oral Weekly   . sodium chloride    . ceFEPime (MAXIPIME) IV Stopped (06/14/17 0112)  . lactated ringers 10 mL/hr at 06/13/17 1037  . vancomycin 1,250 mg (06/14/17 0335)   Anti-infectives (From admission, onward)   Start     Dose/Rate Route Frequency Ordered Stop   06/13/17 1700  vancomycin (VANCOCIN) IVPB 750 mg/150 ml premix  Status:  Discontinued     750 mg 150 mL/hr over 60 Minutes Intravenous Every 12 hours 06/13/17 0312 06/13/17 0754   06/13/17 1500  vancomycin (VANCOCIN) 1,250 mg in sodium chloride 0.9 % 250 mL IVPB     1,250 mg 166.7 mL/hr over 90 Minutes Intravenous Every 12 hours 06/13/17 0754     06/13/17 1400  ceFEPIme (MAXIPIME) 2 g in sodium chloride 0.9 % 100 mL IVPB  Status:  Discontinued     2 g 200 mL/hr over 30 Minutes Intravenous Every 12 hours 06/13/17 0312 06/13/17 0755   06/13/17 0930  ceFEPIme (MAXIPIME) 2 g in sodium chloride 0.9 % 100 mL IVPB     2 g 200 mL/hr over 30 Minutes Intravenous Every 8 hours 06/13/17 0755     06/13/17 0230  vancomycin (VANCOCIN) 2,500 mg in sodium chloride 0.9 % 500 mL  IVPB     2,500 mg 250 mL/hr over 120 Minutes Intravenous  Once 06/13/17 0147 06/13/17 0513   06/13/17 0130  ceFEPIme (MAXIPIME) 2 g in sodium chloride 0.9 % 100 mL IVPB     2 g 200 mL/hr over 30 Minutes Intravenous  Once 06/13/17 0121 06/13/17 0205   06/12/17 2200  clindamycin (CLEOCIN) IVPB 600 mg     600 mg 100 mL/hr over 30 Minutes Intravenous  Once 06/12/17 2149 06/13/17 0011      Assessment/Plan History of cervical adenocarcinoma stage II with a history of chemo and radiation therapy. Necrotizing fasciitis with irrigation and drainage right groin abscess 11/11, and 02/20/2016 Type 2 diabetes Hypertension Morbid obesity - BMI 50   Recurrent abscess of mons/perineum; no obvious communication with vagina S/p incision and debridement of recurrent soft tissue infection of right mons/perineum, examination  under anesthesia, 06/13/17, Dr.Chelsea Kae Heller  FEN:  Carb mod diet ID:  Cefepime/Vancomycin 3/9 =>> day 2  (blood cultures pending) DVT:  Heparin Foley:  In Follow up:  Dr. Kae Heller  Plan: Local wound care and antibiotics.  We will start the dressing changes later today.          LOS: 1 day    Bartley Vuolo 06/14/2017 [phone]

## 2017-06-14 NOTE — Progress Notes (Signed)
PROGRESS NOTE  Brandy Hardy GEX:[id number] DOB: Brandy 10, 1957 DOA: 06/12/2017 PCP: Brandy Hardy, L.Brandy Saucer, MD  HPI/Recap of past 24 hours: Brandy Hardy is a 62 y.o. 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 Associated with chills and subjective fever. Oncologist prescribed keflex which she took without any improvement and went to her PCP who sent her to the ED. Gen surg was consulted, and pt admitted for further management.  Today, pt reported R groin tenderness post op, denied any fever/chills, abdominal pain, chest pain, N/V/D/C.   Assessment/Plan: Active Problems:   Essential hypertension   T2DM (type 2 diabetes mellitus) (HCC)   Obesity, morbid (HCC)   Abscess of groin, right   Abscess of right groin   Endometrial cancer (HCC)   Vulvar cancer (HCC)  Abscess of the right mons/perineum, recurrent S/P I & D on 06/13/17 Afebrile with no leukocytosis CRP 13.2,  LA 2.82-->0.89 CT showing soft tissue defect, possible fistula to bladder or vagina, and possible osteomyelitis of pubic symphysis Gen surg on board, s/p I&D, will further evaluate ??osteomyelitis  Continue Vancomycin + cefepime Monitor closely  DM type 2 Diet controlled A1c pending Monitor closely  HTN  BP on the soft side Not on any home meds  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 and radiation  Morbid obesity   Code Status: Full   Family Communication: Husband at bedside  Disposition Plan: Once stable and ok from surgical stand  point   Consultants:  Gen surg  Procedures:  S/P I&D on 3/9  Antimicrobials:  IV Vancomycin  IV Cefepime  DVT prophylaxis:  [place] Heparin   Objective: Vitals:   06/13/17 1305 06/13/17 1316 06/13/17 2103 06/14/17 0721  BP: 112/65 103/65 (!) 106/40 (!) 107/45  Pulse: (!) 58 (!) 57 70 69  Resp:  18 16 15   Temp:   99 F (37.2 C) 97.9 F (36.6 C)  TempSrc:   Oral Oral  SpO2: 98%  96% 96%  Weight:      Height:        Intake/Output Summary (Last 24 hours) at 06/14/2017 1148 Last data filed at 06/13/2017 1900 Gross per 24 hour  Intake 1162 ml  Output 30 ml  Net 1132 ml   Filed Weights   06/13/17 0230  Weight: 124.7 kg (274 lb 14.6 oz)    Exam:   General:  NAD  Cardiovascular: S1, S2 present  Respiratory: CTA  Abdomen: Obese, NT, ND, BS+  Musculoskeletal: No pedal edema  Skin/GU: R groin area packed, minimal swelling noted, non-foul smelling  Psychiatry: Normal    Data Reviewed: CBC: Recent Labs  Lab 06/12/17 1930 06/13/17 0357 06/14/17 0446  WBC 10.5 8.7 6.6  NEUTROABS 8.5*  --  4.7  HGB 13.8 11.2* 11.0*  HCT 41.7 34.6* 33.6*  MCV 97.0 96.9 98.5  PLT 288 256 278   Basic Metabolic Panel: Recent Labs  Lab 06/12/17 1930 06/13/17 0357 06/14/17 0446  NA 138 139 139  K 3.4* 3.2* 3.7  CL 102 104 107  CO2 23 26 25   GLUCOSE 107* 124* 109*  BUN 17 13 10   CREATININE 0.58 0.57 0.62  CALCIUM 8.8* 8.1*  8.2*   GFR: Estimated Creatinine Clearance: 93.1 mL/min (by C-G formula based on SCr of 0.62 mg/dL). Liver Function Tests: Recent Labs  Lab 06/12/17 1930  AST 34  ALT 36  ALKPHOS 101  BILITOT 0.5  PROT 7.2  ALBUMIN 3.1*   No results for input(s): LIPASE, AMYLASE in the last 168 hours. No results for input(s): AMMONIA in the last 168 hours. Coagulation Profile: Recent Labs  Lab 06/12/17 1930  INR 1.05   Cardiac Enzymes: No results for input(s): CKTOTAL, CKMB, CKMBINDEX, TROPONINI in the last 168 hours. BNP (last 3 results) No  results for input(s): PROBNP in the last 8760 hours. HbA1C: No results for input(s): HGBA1C in the last 72 hours. CBG: Recent Labs  Lab 06/13/17 0952  GLUCAP 100*   Lipid Profile: No results for input(s): CHOL, HDL, LDLCALC, TRIG, CHOLHDL, LDLDIRECT in the last 72 hours. Thyroid Function Tests: No results for input(s): TSH, T4TOTAL, FREET4, T3FREE, THYROIDAB in the last 72 hours. Anemia Panel: No results for input(s): VITAMINB12, FOLATE, FERRITIN, TIBC, IRON, RETICCTPCT in the last 72 hours. Urine analysis:    Component Value Date/Time   COLORURINE YELLOW 06/12/2017 2213   APPEARANCEUR CLEAR 06/12/2017 2213   LABSPEC 1.010 06/12/2017 2213   PHURINE 6.0 06/12/2017 2213   GLUCOSEU NEGATIVE 06/12/2017 2213   GLUCOSEU NEGATIVE 02/21/2013 0830   HGBUR MODERATE (A) 06/12/2017 2213   BILIRUBINUR NEGATIVE 06/12/2017 2213   KETONESUR 5 (A) 06/12/2017 2213   PROTEINUR NEGATIVE 06/12/2017 2213   UROBILINOGEN 0.2 02/21/2013 0830   NITRITE NEGATIVE 06/12/2017 2213   LEUKOCYTESUR MODERATE (A) 06/12/2017 2213   Sepsis Labs: @LABRCNTIP (procalcitonin:4,lacticidven:4)  ) Recent Results (from the past 240 hour(s))  Culture, blood (Routine x 2)     Status: None (Preliminary result)   Collection Time: 06/12/17  7:15 PM  Result Value Ref Range Status   Specimen Description BLOOD RIGHT HAND  Final   Special Requests   Final    BOTTLES DRAWN AEROBIC AND ANAEROBIC Blood Culture adequate volume   Culture   Final    NO GROWTH < 24 HOURS Performed at [facility], [address] [place], [place] [address]    Report Status PENDING  Incomplete  Culture, blood (Routine x 2)     Status: None (Preliminary result)   Collection Time: 06/12/17  7:30 PM  Result Value Ref Range Status   Specimen Description BLOOD RIGHT ARM  Final   Special Requests IN PEDIATRIC BOTTLE Blood Culture adequate volume  Final   Culture   Final    NO GROWTH < 24 HOURS Performed at [facility], [address] [place], [place] [address]    Report Status PENDING  Incomplete  Surgical pcr screen     Status: None   Collection Time: 06/13/17  3:57 AM  Result Value Ref Range Status   MRSA, PCR NEGATIVE NEGATIVE Final   Staphylococcus aureus NEGATIVE NEGATIVE Final    Comment: (NOTE) The Xpert SA Assay (FDA approved for NASAL specimens in patients 40 years of age and older), is one component of a comprehensive surveillance program. It is not intended to diagnose infection nor to guide or monitor treatment. Performed at [facility], [address] [place], [place] [address]       Studies: No results found.  Scheduled Meds: . heparin  5,000 Units Subcutaneous Q8H  . [START ON 06/17/2017] Vitamin D (Ergocalciferol)  50,000 Units Oral Weekly    Continuous Infusions: . sodium chloride    .  ceFEPime (MAXIPIME) IV 2 g (06/14/17 1044)  . lactated ringers 10 mL/hr at 06/13/17 1037  . vancomycin Stopped (06/14/17 0505)     LOS: 1 day     Briant Cedar, MD [facility]   If 7PM-7AM, please contact night-coverage [email or web address] Password [facility] 06/14/2017, 11:48 AM

## 2017-06-15 LAB — CBC WITH DIFFERENTIAL/PLATELET
Basophils Absolute: 0 10*3/uL (ref 0.0–0.1)
Basophils Relative: 0 %
Eosinophils Absolute: 0.2 10*3/uL (ref 0.0–0.7)
Eosinophils Relative: 3 %
HEMATOCRIT: 32 % — AB (ref 36.0–46.0)
HEMOGLOBIN: 10.5 g/dL — AB (ref 12.0–15.0)
LYMPHS ABS: 1.4 10*3/uL (ref 0.7–4.0)
LYMPHS PCT: 23 %
MCH: 32.2 pg (ref 26.0–34.0)
MCHC: 32.8 g/dL (ref 30.0–36.0)
MCV: 98.2 fL (ref 78.0–100.0)
MONOS PCT: 10 %
Monocytes Absolute: 0.6 10*3/uL (ref 0.1–1.0)
NEUTROS PCT: 64 %
Neutro Abs: 3.8 10*3/uL (ref 1.7–7.7)
Platelets: 266 10*3/uL (ref 150–400)
RBC: 3.26 MIL/uL — ABNORMAL LOW (ref 3.87–5.11)
RDW: 14.2 % (ref 11.5–15.5)
WBC: 6 10*3/uL (ref 4.0–10.5)

## 2017-06-15 LAB — BASIC METABOLIC PANEL
Anion gap: 8 (ref 5–15)
BUN: 8 mg/dL (ref 6–20)
CHLORIDE: 103 mmol/L (ref 101–111)
CO2: 26 mmol/L (ref 22–32)
CREATININE: 0.55 mg/dL (ref 0.44–1.00)
Calcium: 8 mg/dL — ABNORMAL LOW (ref 8.9–10.3)
GFR calc Af Amer: >60 mL/min (ref >60)
GFR calc non Af Amer: >60 mL/min (ref >60)
Glucose, Bld: 98 mg/dL (ref 65–99)
POTASSIUM: 3.2 mmol/L — AB (ref 3.5–5.1)
Sodium: 137 mmol/L (ref 135–145)

## 2017-06-15 MED ORDER — POTASSIUM CHLORIDE CRYS ER 20 MEQ PO TBCR
40.0000 meq | EXTENDED_RELEASE_TABLET | Freq: Once | ORAL | Status: AC
  Administered 2017-06-15: 40 meq via ORAL
  Filled 2017-06-15: qty 2

## 2017-06-15 NOTE — Progress Notes (Signed)
[facility] [place] [facility] Progress Note  2 Days Post-Op  Subjective: CC: pain with dressing changes Patient having some pain with dressing changes but states it is improving from the first one. Cellulitis improving. Has been getting up to the bathroom, but is having a little urinary urgency. Tolerating diet, passing flatus but no BM yet. UOP good. VSS.   Objective: Vital signs in last 24 hours: Temp:  [98 F (36.7 C)-98.8 F (37.1 C)] 98 F (36.7 C) (03/11 0429) Pulse Rate:  [65-72] 65 (03/11 0429) Resp:  [16-18] 16 (03/11 0429) BP: (101-116)/(44-55) 103/51 (03/11 0429) SpO2:  [93 %-94 %] 93 % (03/11 0429) Last BM Date: 06/12/17  Intake/Output from previous day: 03/10 0701 - 03/11 0700 In: 1750 [P.O.:500; IV Piggyback:1250] Out: -  Intake/Output this shift: Total I/O In: 120 [P.O.:120] Out: -   PE: Gen:  Alert, NAD, pleasant Card:  Regular rate and rhythm, pedal pulses 2+ BL Pulm:  Normal effort, clear to auscultation bilaterally Abd: Soft, non-tender, non-distended GU: wound as below, about 7 cm deep, minimal purulent drainage on packing    Skin: warm and dry, no rashes  Psych: A&Ox3   Lab Results:  Recent Labs    06/14/17 0446 06/15/17 0406  WBC 6.6 6.0  HGB 11.0* 10.5*  HCT 33.6* 32.0*  PLT 278 266   BMET Recent Labs    06/14/17 0446 06/15/17 0406  NA 139 137  K 3.7 3.2*  CL 107 103  CO2 25 26  GLUCOSE 109* 98  BUN 10 8  CREATININE 0.62 0.55  CALCIUM 8.2* 8.0*   PT/INR Recent Labs    06/12/17 1930  LABPROT 13.6  INR 1.05   CMP     Component Value Date/Time   NA 137 06/15/2017 0406   K 3.2 (L) 06/15/2017 0406   CL 103 06/15/2017 0406   CO2 26 06/15/2017 0406   GLUCOSE 98 06/15/2017 0406   BUN 8 06/15/2017 0406   CREATININE 0.55 06/15/2017 0406   CALCIUM 8.0 (L) 06/15/2017 0406   PROT 7.2 06/12/2017 1930   ALBUMIN 3.1 (L) 06/12/2017 1930   AST 34 06/12/2017 1930   ALT 36 06/12/2017 1930   ALKPHOS 101 06/12/2017 1930   BILITOT 0.5  06/12/2017 1930   GFRNONAA >60 06/15/2017 0406   GFRAA >60 06/15/2017 0406   Lipase  No results found for: LIPASE     Studies/Results: No results found.  Anti-infectives: Anti-infectives (From admission, onward)   Start     Dose/Rate Route Frequency Ordered Stop   06/13/17 1700  vancomycin (VANCOCIN) IVPB 750 mg/150 ml premix  Status:  Discontinued     750 mg 150 mL/hr over 60 Minutes Intravenous Every 12 hours 06/13/17 0312 06/13/17 0754   06/13/17 1500  vancomycin (VANCOCIN) 1,250 mg in sodium chloride 0.9 % 250 mL IVPB     1,250 mg 166.7 mL/hr over 90 Minutes Intravenous Every 12 hours 06/13/17 0754     06/13/17 1400  ceFEPIme (MAXIPIME) 2 g in sodium chloride 0.9 % 100 mL IVPB  Status:  Discontinued     2 g 200 mL/hr over 30 Minutes Intravenous Every 12 hours 06/13/17 0312 06/13/17 0755   06/13/17 0930  ceFEPIme (MAXIPIME) 2 g in sodium chloride 0.9 % 100 mL IVPB     2 g 200 mL/hr over 30 Minutes Intravenous Every 8 hours 06/13/17 0755     06/13/17 0230  vancomycin (VANCOCIN) 2,500 mg in sodium chloride 0.9 % 500 mL IVPB  2,500 mg 250 mL/hr over 120 Minutes Intravenous  Once 06/13/17 0147 06/13/17 0513   06/13/17 0130  ceFEPIme (MAXIPIME) 2 g in sodium chloride 0.9 % 100 mL IVPB     2 g 200 mL/hr over 30 Minutes Intravenous  Once 06/13/17 0121 06/13/17 0205   06/12/17 2200  clindamycin (CLEOCIN) IVPB 600 mg     600 mg 100 mL/hr over 30 Minutes Intravenous  Once 06/12/17 2149 06/13/17 0011       Assessment/Plan History of cervical adenocarcinoma stage II with a history of chemo and radiation therapy. Necrotizing fasciitis with irrigation and drainage right groin abscess 11/11, and 02/20/2016 Type 2 diabetes Hypertension Morbid obesity - BMI 50   Recurrent abscess of mons/perineum; no obvious communication with vagina S/p incision and debridement of recurrent soft tissue infection of right mons/perineum, examination under anesthesia, 06/13/17, Dr.Chelsea  Kae Heller - blood cx pending - continue dressing changes  FEN:  Carb mod diet ID:  Cefepime/Vancomycin 3/9 =>> day 3  (blood cultures pending) DVT:  Heparin Foley:  In Follow up:  Dr. Kae Heller  Plan: Local wound care and antibiotics. Continue BID dressing changes    LOS: 2 days    Brigid Re , [facility] 06/15/2017, 10:30 AM Pager: [phone] Consults: [phone] Mon-Fri 7:00 am-4:30 pm Sat-Sun 7:00 am-11:30 am

## 2017-06-15 NOTE — Progress Notes (Signed)
PROGRESS NOTE  Brandy Hardy ZOX:[id number] DOB: 1956-02-10 DOA: 06/12/2017 PCP: Clovis Riley, L.August Saucer, MD  HPI/Recap of past 24 hours: Brandy Hardy is a 62 y.o. 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 Associated with chills and subjective fever. Oncologist prescribed keflex which she took without any improvement and went to her PCP who sent her to the ED. Gen surg was consulted, and pt admitted for further management.  Today, pt reported R groin tenderness, but improving compared to yesterday, otherwise, no new complaints. Denied any fever/chills, abdominal pain, chest pain, N/V/D/C.   Assessment/Plan: Active Problems:   Essential hypertension   T2DM (type 2 diabetes mellitus) (HCC)   Obesity, morbid (HCC)   Abscess of groin, right   Abscess of right groin   Endometrial cancer (HCC)   Vulvar cancer (HCC)  Abscess of the right mons/perineum, recurrent S/P I & D on 06/13/17 Afebrile with no leukocytosis CRP 13.2,  LA 2.82-->0.89 CT showing soft tissue defect, possible fistula to bladder or vagina, and possible osteomyelitis of pubic symphysis Gen surg on board, s/p I&D, will further evaluate ??osteomyelitis  Continue Vancomycin + cefepime Monitor closely  DM type 2 Diet controlled A1c 5.5 Monitor closely  HTN  BP on the soft side Not on any home meds  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 and radiation  Morbid obesity   Code Status: Full   Family Communication: None at bedside  Disposition Plan: Once stable and  ok from surgical stand point   Consultants:  Gen surg  Procedures:  S/P I&D on 3/9  Antimicrobials:  IV Vancomycin  IV Cefepime  DVT prophylaxis:  [place] Heparin   Objective: Vitals:   06/14/17 1340 06/14/17 2100 06/15/17 0429 06/15/17 1300  BP: (!) 101/44 (!) 116/55 (!) 103/51 (!) 106/45  Pulse: 66 72 65 72  Resp: 16 18 16 16   Temp: 98.8 F (37.1 C) 98.7 F (37.1 C) 98 F (36.7 C) 98.7 F (37.1 C)  TempSrc: Oral Oral Oral Oral  SpO2: 94% 94% 93% 100%  Weight:      Height:        Intake/Output Summary (Last 24 hours) at 06/15/2017 1556 Last data filed at 06/15/2017 1500 Gross per 24 hour  Intake 1990 ml  Output -  Net 1990 ml   Filed Weights   06/13/17 0230  Weight: 124.7 kg (274 lb 14.6 oz)    Exam:   General:  NAD  Cardiovascular: S1, S2 present  Respiratory: CTA  Abdomen: Obese, NT, ND, BS+  Musculoskeletal: No pedal edema  Skin/GU: R groin area packed  Psychiatry: Normal    Data Reviewed: CBC: Recent Labs  Lab 06/12/17 1930 06/13/17 0357 06/14/17 0446 06/15/17 0406  WBC 10.5 8.7 6.6 6.0  NEUTROABS 8.5*  --  4.7 3.8  HGB 13.8 11.2* 11.0* 10.5*  HCT 41.7 34.6* 33.6* 32.0*  MCV 97.0 96.9 98.5 98.2  PLT 288 256 278 266   Basic Metabolic Panel: Recent Labs  Lab 06/12/17 1930 06/13/17 0357 06/14/17 0446 06/15/17 0406  NA 138 139 139 137  K 3.4* 3.2* 3.7 3.2*  CL 102 104 107 103  CO2 23 26  25 26  GLUCOSE 107* 124* 109* 98  BUN 17 13 10 8   CREATININE 0.58 0.57 0.62 0.55  CALCIUM 8.8* 8.1* 8.2* 8.0*   GFR: Estimated Creatinine Clearance: 93.1 mL/min (by C-G formula based on SCr of 0.55 mg/dL). Liver Function Tests: Recent Labs  Lab 06/12/17 1930  AST 34  ALT 36  ALKPHOS 101  BILITOT 0.5  PROT 7.2  ALBUMIN 3.1*   No results for input(s): LIPASE, AMYLASE in the last 168 hours. No results for input(s): AMMONIA in the last 168 hours. Coagulation Profile: Recent Labs  Lab 06/12/17 1930  INR 1.05   Cardiac  Enzymes: No results for input(s): CKTOTAL, CKMB, CKMBINDEX, TROPONINI in the last 168 hours. BNP (last 3 results) No results for input(s): PROBNP in the last 8760 hours. HbA1C: Recent Labs    06/13/17 0357  HGBA1C 5.5   CBG: Recent Labs  Lab 06/13/17 0952  GLUCAP 100*   Lipid Profile: No results for input(s): CHOL, HDL, LDLCALC, TRIG, CHOLHDL, LDLDIRECT in the last 72 hours. Thyroid Function Tests: No results for input(s): TSH, T4TOTAL, FREET4, T3FREE, THYROIDAB in the last 72 hours. Anemia Panel: No results for input(s): VITAMINB12, FOLATE, FERRITIN, TIBC, IRON, RETICCTPCT in the last 72 hours. Urine analysis:    Component Value Date/Time   COLORURINE YELLOW 06/12/2017 2213   APPEARANCEUR CLEAR 06/12/2017 2213   LABSPEC 1.010 06/12/2017 2213   PHURINE 6.0 06/12/2017 2213   GLUCOSEU NEGATIVE 06/12/2017 2213   GLUCOSEU NEGATIVE 02/21/2013 0830   HGBUR MODERATE (A) 06/12/2017 2213   BILIRUBINUR NEGATIVE 06/12/2017 2213   KETONESUR 5 (A) 06/12/2017 2213   PROTEINUR NEGATIVE 06/12/2017 2213   UROBILINOGEN 0.2 02/21/2013 0830   NITRITE NEGATIVE 06/12/2017 2213   LEUKOCYTESUR MODERATE (A) 06/12/2017 2213   Sepsis Labs: @LABRCNTIP (procalcitonin:4,lacticidven:4)  ) Recent Results (from the past 240 hour(s))  Culture, blood (Routine x 2)     Status: None (Preliminary result)   Collection Time: 06/12/17  7:15 PM  Result Value Ref Range Status   Specimen Description BLOOD RIGHT HAND  Final   Special Requests   Final    BOTTLES DRAWN AEROBIC AND ANAEROBIC Blood Culture adequate volume   Culture   Final    NO GROWTH 3 DAYS Performed at [facility], [address] [place], [place] [address]    Report Status PENDING  Incomplete  Culture, blood (Routine x 2)     Status: None (Preliminary result)   Collection Time: 06/12/17  7:30 PM  Result Value Ref Range Status   Specimen Description BLOOD RIGHT ARM  Final   Special Requests IN PEDIATRIC BOTTLE Blood Culture  adequate volume  Final   Culture   Final    NO GROWTH 3 DAYS Performed at [facility], [address] [place], [place] [address]    Report Status PENDING  Incomplete  Surgical pcr screen     Status: None   Collection Time: 06/13/17  3:57 AM  Result Value Ref Range Status   MRSA, PCR NEGATIVE NEGATIVE Final   Staphylococcus aureus NEGATIVE NEGATIVE Final    Comment: (NOTE) The Xpert SA Assay (FDA approved for NASAL specimens in patients 76 years of age and older), is one component of a comprehensive surveillance program. It is not intended to diagnose infection nor to guide or monitor treatment. Performed at [facility], [address], [place], [place] [address]       Studies: No results found.  Scheduled Meds: . heparin  5,000 Units Subcutaneous Q8H  . pneumococcal 23 valent vaccine  0.5 mL Intramuscular Tomorrow-1000  . senna-docusate  1 tablet Oral BID  . [START ON 06/17/2017] Vitamin D (Ergocalciferol)  50,000 Units Oral Weekly    Continuous Infusions: . ceFEPime (MAXIPIME) IV Stopped (06/15/17 1231)  . lactated ringers 10 mL/hr at 06/13/17 1037  . vancomycin Stopped (06/15/17 1014)     LOS: 2 days     Briant Cedar, MD [facility]   If 7PM-7AM, please contact night-coverage [email or web address] Password [employer] 06/15/2017, 3:56 PM

## 2017-06-16 ENCOUNTER — Encounter (HOSPITAL_COMMUNITY): Payer: Self-pay | Admitting: *Deleted

## 2017-06-16 LAB — CBC WITH DIFFERENTIAL/PLATELET
BASOS PCT: 0 %
Basophils Absolute: 0 10*3/uL (ref 0.0–0.1)
Eosinophils Absolute: 0.2 10*3/uL (ref 0.0–0.7)
Eosinophils Relative: 4 %
HEMATOCRIT: 33.5 % — AB (ref 36.0–46.0)
HEMOGLOBIN: 10.8 g/dL — AB (ref 12.0–15.0)
LYMPHS ABS: 1.9 10*3/uL (ref 0.7–4.0)
LYMPHS PCT: 30 %
MCH: 31.2 pg (ref 26.0–34.0)
MCHC: 32.2 g/dL (ref 30.0–36.0)
MCV: 96.8 fL (ref 78.0–100.0)
MONOS PCT: 9 %
Monocytes Absolute: 0.6 10*3/uL (ref 0.1–1.0)
NEUTROS ABS: 3.6 10*3/uL (ref 1.7–7.7)
NEUTROS PCT: 57 %
Platelets: 257 10*3/uL (ref 150–400)
RBC: 3.46 MIL/uL — ABNORMAL LOW (ref 3.87–5.11)
RDW: 13.9 % (ref 11.5–15.5)
WBC: 6.3 10*3/uL (ref 4.0–10.5)

## 2017-06-16 LAB — C-REACTIVE PROTEIN: CRP: 3.7 mg/dL — ABNORMAL HIGH (ref <1.0)

## 2017-06-16 LAB — BASIC METABOLIC PANEL
Anion gap: 8 (ref 5–15)
BUN: 7 mg/dL (ref 6–20)
CHLORIDE: 102 mmol/L (ref 101–111)
CO2: 28 mmol/L (ref 22–32)
CREATININE: 0.61 mg/dL (ref 0.44–1.00)
Calcium: 8.4 mg/dL — ABNORMAL LOW (ref 8.9–10.3)
GFR calc non Af Amer: >60 mL/min (ref >60)
Glucose, Bld: 109 mg/dL — ABNORMAL HIGH (ref 65–99)
Potassium: 3.6 mmol/L (ref 3.5–5.1)
Sodium: 138 mmol/L (ref 135–145)

## 2017-06-16 LAB — SEDIMENTATION RATE: SED RATE: 66 mm/h — AB (ref 0–22)

## 2017-06-16 LAB — VANCOMYCIN, TROUGH: Vancomycin Tr: 15 ug/mL (ref 15–20)

## 2017-06-16 NOTE — Progress Notes (Signed)
PROGRESS NOTE  Brandy [place] ZOX:[id number] DOB: Oct 20, 1955 DOA: 06/12/2017 PCP: Clovis Riley, L.August Saucer, MD  HPI/Recap of past 24 hours: Brandy [place] is a 62 y.o. 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 Associated with chills and subjective fever. Oncologist prescribed keflex which she took without any improvement and went to her PCP who sent her to the ED. Gen surg was consulted, and pt admitted for further management.  Today, denies any new complaints. Denied any fever/chills, abdominal pain, chest pain, N/V/D/C.   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, last seen by ortho in [facility] on 10/2016, rec a conservative approach due to concerns of poor wound healing due to hx of radiation to the area Xray done 7/18 showed osteo of pubic symphysis as well as CT May consider calling ID while inpxt or schedule outpt visit with ID  DM type 2 Diet controlled A1c 5.5 Monitor  closely  HTN  BP on the soft side Not on any home meds  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 and radiation  Morbid obesity   Code Status: Full   Family Communication: None at bedside  Disposition Plan: Once stable and ok from surgical stand point   Consultants:  Gen surg  Procedures:  S/P I&D on 3/9  Antimicrobials:  IV Vancomycin  IV Cefepime  DVT prophylaxis:  [place] Heparin   Objective: Vitals:   06/15/17 1300 06/15/17 2107 06/16/17 0658 06/16/17 1300  BP: (!) 106/45 (!) 108/39 (!) 108/50 (!) 118/51  Pulse: 72 75 60 73  Resp: 16 15 16 16   Temp: 98.7 F (37.1 C) 98.4 F (36.9 C) 98.8 F (37.1 C) 97.7 F (36.5 C)  TempSrc: Oral Oral Oral Oral  SpO2: 100% 95% 98% 99%  Weight:      Height:        Intake/Output Summary (Last 24 hours) at 06/16/2017 1740 Last data filed at 06/16/2017 1300 Gross per 24 hour  Intake 960 ml  Output -  Net 960 ml   Filed Weights   06/13/17 0230  Weight: 124.7 kg (274 lb 14.6 oz)    Exam:   General:  NAD  Cardiovascular: S1, S2 present  Respiratory: CTA  Abdomen: Obese, NT, ND, BS+  Musculoskeletal: No pedal edema  Skin/GU: R groin area packed  Psychiatry: Normal    Data Reviewed: CBC: Recent Labs  Lab 06/12/17 1930 06/13/17 0357 06/14/17 0446 06/15/17 0406 06/16/17 0235  WBC 10.5 8.7 6.6 6.0 6.3  NEUTROABS 8.5*  --  4.7 3.8 3.6  HGB 13.8 11.2* 11.0* 10.5* 10.8*  HCT 41.7 34.6* 33.6* 32.0* 33.5*  MCV 97.0 96.9 98.5 98.2 96.8  PLT 288 256 278 266 257   Basic Metabolic Panel: Recent Labs  Lab 06/12/17 1930 06/13/17 0357 06/14/17 0446 06/15/17 0406 06/16/17 0235  NA 138 139 139 137 138  K 3.4* 3.2* 3.7 3.2* 3.6  CL 102 104 107 103 102  CO2 23 26 25 26 28   GLUCOSE 107* 124* 109* 98 109*  BUN 17 13 10 8 7   CREATININE 0.58 0.57 0.62 0.55 0.61  CALCIUM 8.8* 8.1* 8.2* 8.0* 8.4*   GFR: Estimated Creatinine Clearance: 93.1 mL/min (by C-G  formula based on SCr of 0.61 mg/dL). Liver Function Tests: Recent Labs  Lab 06/12/17 1930  AST 34  ALT 36  ALKPHOS 101  BILITOT 0.5  PROT 7.2  ALBUMIN 3.1*   No results for input(s): LIPASE, AMYLASE in the last 168 hours. No results for input(s): AMMONIA in the last 168 hours. Coagulation Profile: Recent Labs  Lab 06/12/17 1930  INR 1.05   Cardiac Enzymes: No results for input(s): CKTOTAL, CKMB, CKMBINDEX, TROPONINI in the last 168 hours. BNP (last 3 results) No results for input(s): PROBNP in the last 8760 hours. HbA1C: No results for input(s): HGBA1C in the last 72 hours. CBG: Recent Labs  Lab 06/13/17 0952  GLUCAP 100*   Lipid Profile: No results for input(s): CHOL, HDL, LDLCALC, TRIG, CHOLHDL, LDLDIRECT in the last 72 hours. Thyroid Function Tests: No results for input(s): TSH, T4TOTAL, FREET4, T3FREE, THYROIDAB in the last 72 hours. Anemia Panel: No results for input(s): VITAMINB12, FOLATE, FERRITIN, TIBC, IRON, RETICCTPCT in the last 72 hours. Urine analysis:    Component Value Date/Time   COLORURINE YELLOW 06/12/2017 2213   APPEARANCEUR CLEAR 06/12/2017 2213   LABSPEC 1.010 06/12/2017 2213   PHURINE 6.0 06/12/2017 2213   GLUCOSEU NEGATIVE 06/12/2017 2213   GLUCOSEU NEGATIVE 02/21/2013 0830   HGBUR MODERATE (A) 06/12/2017 2213   BILIRUBINUR NEGATIVE 06/12/2017 2213   KETONESUR 5 (A) 06/12/2017 2213   PROTEINUR NEGATIVE 06/12/2017 2213   UROBILINOGEN 0.2 02/21/2013 0830   NITRITE NEGATIVE 06/12/2017 2213   LEUKOCYTESUR MODERATE (A) 06/12/2017 2213   Sepsis Labs: @LABRCNTIP (procalcitonin:4,lacticidven:4)  ) Recent Results (from the past 240 hour(s))  Culture, blood (Routine x 2)     Status: None (Preliminary result)   Collection Time: 06/12/17  7:15 PM  Result Value Ref Range Status   Specimen Description BLOOD RIGHT HAND  Final   Special Requests   Final    BOTTLES DRAWN AEROBIC AND ANAEROBIC Blood Culture adequate volume   Culture   Final    NO  GROWTH 4 DAYS Performed at [facility], [address] [place], [place] [address]    Report Status PENDING  Incomplete  Culture, blood (Routine x 2)     Status: None (Preliminary result)   Collection Time: 06/12/17  7:30 PM  Result Value Ref Range Status   Specimen Description BLOOD RIGHT ARM  Final   Special Requests IN PEDIATRIC BOTTLE Blood Culture adequate volume  Final   Culture   Final    NO GROWTH 4 DAYS Performed at [facility], [address] [place], [place] [address]    Report Status PENDING  Incomplete  Surgical pcr screen     Status: None   Collection Time: 06/13/17  3:57 AM  Result Value Ref Range Status   MRSA, PCR NEGATIVE NEGATIVE Final   Staphylococcus aureus NEGATIVE NEGATIVE Final    Comment: (NOTE)  The Xpert SA Assay (FDA approved for NASAL specimens in patients 45 years of age and older), is one component of a comprehensive surveillance program. It is not intended to diagnose infection nor to guide or monitor treatment. Performed at [facility] Lab, [address] [place], [place] [address]       Studies: No results found.  Scheduled Meds: . heparin  5,000 Units Subcutaneous Q8H  . senna-docusate  1 tablet Oral BID  . [START ON 06/17/2017] Vitamin D (Ergocalciferol)  50,000 Units Oral Weekly    Continuous Infusions: . ceFEPime (MAXIPIME) IV Stopped (06/16/17 1435)  . lactated ringers 10 mL/hr at 06/13/17 1037  . vancomycin 1,250 mg (06/16/17 1520)     LOS: 3 days     Briant Cedar, MD [facility]   If 7PM-7AM, please contact night-coverage [email or web address] Password [facility] 06/16/2017, 5:40 PM

## 2017-06-16 NOTE — Progress Notes (Signed)
Pharmacy Antibiotic Note  Brandy [place] is a 62 y.o. female admitted on 06/12/2017 with abscess in groin with history of osteomyelitis in same region. Pt is on day #4 of vancomycin and cefepime. Vancomycin trough returned therapeutic at 15, ok for possible osteomyelitis.    Plan: -Vancomycin 750 mg IV q12h -Cefepime 2 g IV q12h -Monitor renal fx, cultures, check VT at steady state  Height: 5\' 2"  (157.5 cm) Weight: 274 lb 14.6 oz (124.7 kg) IBW/kg (Calculated) : 50.1  Temp (24hrs), Avg:98.4 F (36.9 C), Min:98 F (36.7 C), Max:98.7 F (37.1 C)  Recent Labs  Lab 06/12/17 1930 06/12/17 2017 06/12/17 2210 06/13/17 0357 06/14/17 0446 06/15/17 0406 06/16/17 0230 06/16/17 0235  WBC 10.5  --   --  8.7 6.6 6.0  --  6.3  CREATININE 0.58  --   --  0.57 0.62 0.55  --   --   LATICACIDVEN  --  2.82* 0.89  --   --   --   --   --   VANCOTROUGH  --   --   --   --   --   --  15  --      Antimicrobials this admission: 3/9 cefepime > 3/9 vancomycin >  Dose adjustments this admission: 3/12 VT: 15  Microbiology results: 3/8 blood cx:  Harvel Quale 06/16/2017 3:50 AM

## 2017-06-17 DIAGNOSIS — Z923 Personal history of irradiation: Secondary | ICD-10-CM

## 2017-06-17 DIAGNOSIS — Z9221 Personal history of antineoplastic chemotherapy: Secondary | ICD-10-CM

## 2017-06-17 DIAGNOSIS — M8588 Other specified disorders of bone density and structure, other site: Secondary | ICD-10-CM

## 2017-06-17 DIAGNOSIS — Z8542 Personal history of malignant neoplasm of other parts of uterus: Secondary | ICD-10-CM

## 2017-06-17 DIAGNOSIS — Z872 Personal history of diseases of the skin and subcutaneous tissue: Secondary | ICD-10-CM

## 2017-06-17 DIAGNOSIS — L03314 Cellulitis of groin: Secondary | ICD-10-CM

## 2017-06-17 DIAGNOSIS — L02214 Cutaneous abscess of groin: Secondary | ICD-10-CM

## 2017-06-17 LAB — CBC WITH DIFFERENTIAL/PLATELET
BASOS ABS: 0 10*3/uL (ref 0.0–0.1)
BASOS PCT: 0 %
EOS ABS: 0.2 10*3/uL (ref 0.0–0.7)
Eosinophils Relative: 4 %
HCT: 34.3 % — ABNORMAL LOW (ref 36.0–46.0)
Hemoglobin: 11.1 g/dL — ABNORMAL LOW (ref 12.0–15.0)
Lymphocytes Relative: 24 %
Lymphs Abs: 1.5 10*3/uL (ref 0.7–4.0)
MCH: 31.7 pg (ref 26.0–34.0)
MCHC: 32.4 g/dL (ref 30.0–36.0)
MCV: 98 fL (ref 78.0–100.0)
MONO ABS: 0.5 10*3/uL (ref 0.1–1.0)
MONOS PCT: 8 %
NEUTROS PCT: 64 %
Neutro Abs: 3.9 10*3/uL (ref 1.7–7.7)
Platelets: 302 10*3/uL (ref 150–400)
RBC: 3.5 MIL/uL — ABNORMAL LOW (ref 3.87–5.11)
RDW: 14.3 % (ref 11.5–15.5)
WBC: 6.1 10*3/uL (ref 4.0–10.5)

## 2017-06-17 LAB — BASIC METABOLIC PANEL
ANION GAP: 9 (ref 5–15)
BUN: 9 mg/dL (ref 6–20)
CALCIUM: 8.2 mg/dL — AB (ref 8.9–10.3)
CO2: 28 mmol/L (ref 22–32)
CREATININE: 0.58 mg/dL (ref 0.44–1.00)
Chloride: 103 mmol/L (ref 101–111)
Glucose, Bld: 101 mg/dL — ABNORMAL HIGH (ref 65–99)
Potassium: 3.6 mmol/L (ref 3.5–5.1)
SODIUM: 140 mmol/L (ref 135–145)

## 2017-06-17 LAB — CULTURE, BLOOD (ROUTINE X 2)
CULTURE: NO GROWTH
Culture: NO GROWTH
SPECIAL REQUESTS: ADEQUATE
Special Requests: ADEQUATE

## 2017-06-17 NOTE — Discharge Instructions (Signed)
WOUND CARE: - dressing to be changed twice daily - supplies: sterile saline, kerlix, scissors, ABD pads, tape  - remove dressing and all packing carefully, moistening with sterile saline as needed to avoid packing/internal dressing sticking to the wound. - clean edges of skin around the wound with water/gauze, making sure there is no tape debris or leakage left on skin that could cause skin irritation or breakdown. - dampen and clean kerlix with sterile saline and pack wound from wound base to skin level, making sure to take note of any possible areas of wound tracking, tunneling and packing appropriately. Wound can be packed loosely. Trim kerlix to size if a whole kerlix is not required. - cover wound with a dry ABD pad and secure with tape.  - write the date/time on the dry dressing/tape to better track when the last dressing change occurred. - apply any skin protectant/powder recommended by clinician to protect skin/skin folds. - change dressing as needed if leakage occurs, wound gets contaminated, or patient requests to shower. - patient may shower daily with wound open and following the shower the wound should be dried and a clean dressing placed.   

## 2017-06-17 NOTE — Progress Notes (Signed)
[facility] Progress Note  4 Days Post-Op  Subjective: CC-  No new complaints. Overall doing well. States that she really on has pain during dressing changes. She required IV morphine for dressing change this morning. WBC WNL and afebrile.  Objective: Vital signs in last 24 hours: Temp:  [97.7 F (36.5 C)-99 F (37.2 C)] 98.6 F (37 C) (03/13 0438) Pulse Rate:  [63-73] 63 (03/13 0438) Resp:  [16-17] 16 (03/13 0438) BP: (116-122)/(48-52) 116/48 (03/13 0438) SpO2:  [97 %-99 %] 99 % (03/13 0438) Last BM Date: 06/15/17  Intake/Output from previous day: 03/12 0701 - 03/13 0700 In: 2400 [P.O.:2400] Out: -  Intake/Output this shift: No intake/output data recorded.  PE: Gen:  Alert, NAD, pleasant HEENT: EOM's intact, pupils equal and round Pulm:  effort normal GU:     Lab Results:  Recent Labs    06/16/17 0235 06/17/17 0417  WBC 6.3 6.1  HGB 10.8* 11.1*  HCT 33.5* 34.3*  PLT 257 302   BMET Recent Labs    06/16/17 0235 06/17/17 0417  NA 138 140  K 3.6 3.6  CL 102 103  CO2 28 28  GLUCOSE 109* 101*  BUN 7 9  CREATININE 0.61 0.58  CALCIUM 8.4* 8.2*   PT/INR No results for input(s): LABPROT, INR in the last 72 hours. CMP     Component Value Date/Time   NA 140 06/17/2017 0417   K 3.6 06/17/2017 0417   CL 103 06/17/2017 0417   CO2 28 06/17/2017 0417   GLUCOSE 101 (H) 06/17/2017 0417   BUN 9 06/17/2017 0417   CREATININE 0.58 06/17/2017 0417   CALCIUM 8.2 (L) 06/17/2017 0417   PROT 7.2 06/12/2017 1930   ALBUMIN 3.1 (L) 06/12/2017 1930   AST 34 06/12/2017 1930   ALT 36 06/12/2017 1930   ALKPHOS 101 06/12/2017 1930   BILITOT 0.5 06/12/2017 1930   GFRNONAA >60 06/17/2017 0417   GFRAA >60 06/17/2017 0417   Lipase  No results found for: LIPASE     Studies/Results: No results found.  Anti-infectives: Anti-infectives (From admission, onward)   Start     Dose/Rate Route Frequency Ordered Stop   06/13/17 1700  vancomycin (VANCOCIN)  IVPB 750 mg/150 ml premix  Status:  Discontinued     750 mg 150 mL/hr over 60 Minutes Intravenous Every 12 hours 06/13/17 0312 06/13/17 0754   06/13/17 1500  vancomycin (VANCOCIN) 1,250 mg in sodium chloride 0.9 % 250 mL IVPB     1,250 mg 166.7 mL/hr over 90 Minutes Intravenous Every 12 hours 06/13/17 0754     06/13/17 1400  ceFEPIme (MAXIPIME) 2 g in sodium chloride 0.9 % 100 mL IVPB  Status:  Discontinued     2 g 200 mL/hr over 30 Minutes Intravenous Every 12 hours 06/13/17 0312 06/13/17 0755   06/13/17 0930  ceFEPIme (MAXIPIME) 2 g in sodium chloride 0.9 % 100 mL IVPB     2 g 200 mL/hr over 30 Minutes Intravenous Every 8 hours 06/13/17 0755     06/13/17 0230  vancomycin (VANCOCIN) 2,500 mg in sodium chloride 0.9 % 500 mL IVPB     2,500 mg 250 mL/hr over 120 Minutes Intravenous  Once 06/13/17 0147 06/13/17 0513   06/13/17 0130  ceFEPIme (MAXIPIME) 2 g in sodium chloride 0.9 % 100 mL IVPB     2 g 200 mL/hr over 30 Minutes Intravenous  Once 06/13/17 0121 06/13/17 0205   06/12/17 2200  clindamycin (CLEOCIN) IVPB 600 mg  600 mg 100 mL/hr over 30 Minutes Intravenous  Once 06/12/17 2149 06/13/17 0011       Assessment/Plan History of cervical adenocarcinoma stage II with a history of chemo and radiation therapy. Necrotizing fasciitis with irrigation and drainage right groin abscess 11/11, and 02/20/2016 Type 2 diabetes Hypertension Morbid obesity - BMI 50  Likely underlying osteomyelitis of pubic symphysis and bilateral pubic rami Recurrent abscess of mons/perineum;no obvious communication with vagina S/pincision and debridement of recurrent soft tissue infection of right mons/perineum, examination under anesthesia, 06/13/17, Dr.Chelsea Kae Heller - POD 4 - blood cx pending, NGTD - continue dressing changes  FEN: Carb mod diet ID: Cefepime/Vancomycin 3/9 =>>day 4 (blood cultures pending) DVT: Heparin Follow up: Dr. Kae Heller  Plan: Wound stable, does not need further  surgical debridement. WBC WNL and afebrile. Continue local wound care with BID wet to dry dressing changes and antibiotics. Antibiotics per internal medicine/ID for underlying osteomyelitis. Ok to shower with wound open. Home health ordered for assistance with wound care, patient also states that her husband and daughter will be able to help.   LOS: 4 days    Wellington Hampshire , [facility] 06/17/2017, 9:58 AM Pager: [phone] Consults: [phone] Mon-Fri 7:00 am-4:30 pm Sat-Sun 7:00 am-11:30 am

## 2017-06-17 NOTE — Progress Notes (Signed)
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 Associated with chills and subjective fever. Oncologist prescribed keflex which she took without any improvement and went to her PCP who sent her to the ED. Gen surg was consulted, and pt admitted for further management.  Assessment & Plan:   Active Problems:   Essential hypertension   T2DM (type 2 diabetes mellitus) (HCC)   Obesity, morbid (HCC)   Abscess of groin, right   Abscess of right groin   Endometrial cancer (HCC)   Vulvar cancer (HCC)   Abscess of the right mons/perineum, recurrent S/P I & D on 06/13/17 Afebrile with no leukocytosis CRP 13.2-->3.7  LA 2.82-->0.89 CT showing soft tissue defect, possible fistula to bladder or vagina, and possible osteomyelitis of pubic symphysis Gen surg on board, s/p I&D Will c/s infectious disease given osteo as well Continue Vancomycin + cefepime Monitor closely  Chronic osteomyelitis of the pubic symphysis Ongoing for years, last seen by ortho in [facility] on 10/2016, rec Brandy [place] conservative approach due to concerns of poor wound healing due to hx of radiation to the area Xray done 7/18 showed osteo of pubic symphysis as well as CT Will c/s ID  DM type 2 Diet controlled A1c 5.5 Monitor closely  HTN  BP on the soft side Not on any home meds  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 and  radiation  Morbid obesity  DVT prophylaxis: heparin Code Status: full code Family Communication: none at bedside Disposition Plan: pending ID eval and plan for abx at d/c   Consultants:   ID  Surgery  Procedures:  I&D of recurrent soft tissue infection of right mons/perineum, examination under anesthesia 06/13/17  Antimicrobials:  Anti-infectives (From admission, onward)   Start     Dose/Rate Route Frequency Ordered Stop   06/13/17 1700  vancomycin (VANCOCIN) IVPB 750 mg/150 ml premix  Status:  Discontinued     750 mg 150 mL/hr over 60 Minutes Intravenous Every 12 hours 06/13/17 0312 06/13/17 0754   06/13/17 1500  vancomycin (VANCOCIN) 1,250 mg in sodium chloride 0.9 % 250 mL IVPB     1,250 mg 166.7 mL/hr over 90 Minutes Intravenous Every 12 hours 06/13/17 0754     06/13/17 1400  ceFEPIme (MAXIPIME) 2 g in sodium chloride 0.9 % 100 mL IVPB  Status:  Discontinued     2 g 200 mL/hr over 30 Minutes Intravenous Every 12 hours 06/13/17 0312 06/13/17 0755   06/13/17 0930  ceFEPIme (MAXIPIME) 2 g in sodium chloride 0.9 % 100 mL IVPB     2 g 200 mL/hr over 30 Minutes Intravenous Every 8 hours 06/13/17 0755     06/13/17 0230  vancomycin (VANCOCIN) 2,500 mg in sodium chloride 0.9 % 500 mL IVPB     2,500 mg 250 mL/hr over 120 Minutes Intravenous  Once 06/13/17 0147 06/13/17 0513   06/13/17 0130  ceFEPIme (MAXIPIME) 2 g in sodium chloride 0.9 % 100 mL IVPB     2 g 200 mL/hr over 30  Minutes Intravenous  Once 06/13/17 0121 06/13/17 0205   06/12/17 2200  clindamycin (CLEOCIN) IVPB 600 mg     600 mg 100 mL/hr over 30 Minutes Intravenous  Once 06/12/17 2149 06/13/17 0011      Subjective: Pain with dressing change and surgery looking at wound. Otherwise doing ok.   Objective: Vitals:   06/16/17 0658 06/16/17 1300 06/16/17 2031 06/17/17 0438  BP: (!) 108/50 (!) 118/51 (!) 122/52 (!) 116/48  Pulse: 60 73 73 63  Resp: 16 16 17 16   Temp: 98.8 F (37.1 C) 97.7 F (36.5 C) 99 F  (37.2 C) 98.6 F (37 C)  TempSrc: Oral Oral Oral Oral  SpO2: 98% 99% 97% 99%  Weight:      Height:        Intake/Output Summary (Last 24 hours) at 06/17/2017 1009 Last data filed at 06/17/2017 0438 Gross per 24 hour  Intake 1920 ml  Output -  Net 1920 ml   Filed Weights   06/13/17 0230  Weight: 124.7 kg (274 lb 14.6 oz)    Examination:  General exam: Appears calm and comfortable.  Obese. Respiratory system: Clear to auscultation. Respiratory effort normal. Cardiovascular system: S1 & S2 heard, RRR. No JVD, murmurs, rubs, gallops or clicks. No pedal edema. Gastrointestinal system: Abdomen is nondistended, soft and nontender. No organomegaly or masses felt. Normal bowel sounds heard. Central nervous system: Alert and oriented. No focal neurological deficits. Extremities: Symmetric 5 x 5 power. Skin: R groin wound packed, drain in place.  ABD's overlying.  No purulence appreciated.  Psychiatry: Judgement and insight appear normal. Mood & affect appropriate.     Data Reviewed: I have personally reviewed following labs and imaging studies  CBC: Recent Labs  Lab 06/12/17 1930 06/13/17 0357 06/14/17 0446 06/15/17 0406 06/16/17 0235 06/17/17 0417  WBC 10.5 8.7 6.6 6.0 6.3 6.1  NEUTROABS 8.5*  --  4.7 3.8 3.6 3.9  HGB 13.8 11.2* 11.0* 10.5* 10.8* 11.1*  HCT 41.7 34.6* 33.6* 32.0* 33.5* 34.3*  MCV 97.0 96.9 98.5 98.2 96.8 98.0  PLT 288 256 278 266 257 302   Basic Metabolic Panel: Recent Labs  Lab 06/13/17 0357 06/14/17 0446 06/15/17 0406 06/16/17 0235 06/17/17 0417  NA 139 139 137 138 140  K 3.2* 3.7 3.2* 3.6 3.6  CL 104 107 103 102 103  CO2 26 25 26 28 28   GLUCOSE 124* 109* 98 109* 101*  BUN 13 10 8 7 9   CREATININE 0.57 0.62 0.55 0.61 0.58  CALCIUM 8.1* 8.2* 8.0* 8.4* 8.2*   GFR: Estimated Creatinine Clearance: 93.1 mL/min (by C-G formula based on SCr of 0.58 mg/dL). Liver Function Tests: Recent Labs  Lab 06/12/17 1930  AST 34  ALT 36  ALKPHOS 101    BILITOT 0.5  PROT 7.2  ALBUMIN 3.1*   No results for input(s): LIPASE, AMYLASE in the last 168 hours. No results for input(s): AMMONIA in the last 168 hours. Coagulation Profile: Recent Labs  Lab 06/12/17 1930  INR 1.05   Cardiac Enzymes: No results for input(s): CKTOTAL, CKMB, CKMBINDEX, TROPONINI in the last 168 hours. BNP (last 3 results) No results for input(s): PROBNP in the last 8760 hours. HbA1C: No results for input(s): HGBA1C in the last 72 hours. CBG: Recent Labs  Lab 06/13/17 0952  GLUCAP 100*   Lipid Profile: No results for input(s): CHOL, HDL, LDLCALC, TRIG, CHOLHDL, LDLDIRECT in the last 72 hours. Thyroid Function Tests: No results for input(s): TSH, T4TOTAL, FREET4, T3FREE, THYROIDAB in  the last 72 hours. Anemia Panel: No results for input(s): VITAMINB12, FOLATE, FERRITIN, TIBC, IRON, RETICCTPCT in the last 72 hours. Sepsis Labs: Recent Labs  Lab 06/12/17 2017 06/12/17 2210  LATICACIDVEN 2.82* 0.89    Recent Results (from the past 240 hour(s))  Culture, blood (Routine x 2)     Status: None (Preliminary result)   Collection Time: 06/12/17  7:15 PM  Result Value Ref Range Status   Specimen Description BLOOD RIGHT HAND  Final   Special Requests   Final    BOTTLES DRAWN AEROBIC AND ANAEROBIC Blood Culture adequate volume   Culture   Final    NO GROWTH 4 DAYS Performed at [facility], [address], [place], [place] [address]    Report Status PENDING  Incomplete  Culture, blood (Routine x 2)     Status: None (Preliminary result)   Collection Time: 06/12/17  7:30 PM  Result Value Ref Range Status   Specimen Description BLOOD RIGHT ARM  Final   Special Requests IN PEDIATRIC BOTTLE Blood Culture adequate volume  Final   Culture   Final    NO GROWTH 4 DAYS Performed at [facility], [address] [place], [place] [address]    Report Status PENDING  Incomplete  Surgical pcr screen     Status: None   Collection Time: 06/13/17  3:57 AM   Result Value Ref Range Status   MRSA, PCR NEGATIVE NEGATIVE Final   Staphylococcus aureus NEGATIVE NEGATIVE Final    Comment: (NOTE) The Xpert SA Assay (FDA approved for NASAL specimens in patients 20 years of age and older), is one component of [place] comprehensive surveillance program. It is not intended to diagnose infection nor to guide or monitor treatment. Performed at [facility], [address], [place], [place] [address]          Radiology Studies: No results found.      Scheduled Meds: . heparin  5,000 Units Subcutaneous Q8H  . senna-docusate  1 tablet Oral BID  . Vitamin D (Ergocalciferol)  50,000 Units Oral Weekly   Continuous Infusions: . ceFEPime (MAXIPIME) IV Stopped (06/17/17 0234)  . lactated ringers 10 mL/hr at 06/13/17 1037  . vancomycin 1,250 mg (06/17/17 0434)     LOS: 4 days    Time spent: over 30 min    Lacretia Nicks, MD [facility] Pager [phone]  If 7PM-7AM, please contact night-coverage [email or web address] Password [email or web address] 06/17/2017, 10:09 AM

## 2017-06-17 NOTE — Care Management Note (Signed)
Case Management Note  Patient Details  Name: Brandy [place] MRN: [id number] Date of Birth: 06/25/1955  Subjective/Objective: 62 yr old female admitted with right groin abscess. Patient  s/p  Right groin abscess.   Patient has history of endometrial CA with necrotizing fascititis.                  Action/Plan:  Case manager spoke with patient concerning discharge plan. Patient says her husband and daughter will assist her at discharge, they will be doing her dressing changes.  Choice for Home Health Agency was offered. Referral was called to [facility], [facility].    Expected Discharge Date:   pending               Expected Discharge Plan:  [place]  In-House Referral:  NA  Discharge planning Services  CM Consult  Post Acute Care Choice:  Home Health Choice offered to:  Patient  DME Arranged:  N/A DME Agency:  NA  HH Arranged:  RN [place] Agency:  [place]  Status of Service:  Completed, signed off  If discussed at [place] of Stay Meetings, dates discussed:    Additional Comments:  Ninfa Meeker, RN 06/17/2017, 2:17 PM

## 2017-06-17 NOTE — Consult Note (Signed)
[place] for Infectious Disease       Reason for Consult: ? osteomyelitis    Referring Physician: Dr. Florene Glen  Active Problems:   Essential hypertension   T2DM (type 2 diabetes mellitus) ([place])   Obesity, morbid (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 heparin  5,000 Units Subcutaneous Q8H  . senna-docusate  1 tablet Oral BID  . Vitamin D (Ergocalciferol)  50,000 Units Oral Weekly    Recommendations: MRI of pelvis to determine if osteomyelitis - ordered Continue current antibiotics   Assessment: She has multiple erosions of the pubic symphysis and inferior pubic rami bilaterally concerning for osteomyelitis.  Some findings were there in July but not the same areas.  Unclear if radiation induced or infection.  Discussed with radiology and MRI can help distinguish.    Antibiotics: Vancomycin and cefepime  HPI: Brandy Hardy is a 62 y.o. 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  Abscess did not go to bone.  Debrided by surgery.  No growth on culture.  CT scan noted findings as above.   CT scan independently reviewed with radiology and confirmed report.   Review of Systems:  Constitutional: negative for fevers, chills, fatigue and malaise Gastrointestinal: negative for diarrhea Integument/breast: negative for rash Musculoskeletal: negative for arthralgias All other systems reviewed and are negative    Past Medical History:  Diagnosis Date  . Cervical adenocarcinoma ([place])    stage II  . Endometrial cancer ([place])    with recurrance at introitus   . History of prediabetes   . 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 Renal disorder     Social History   Tobacco Use  . Smoking status:  Never Smoker  . Smokeless tobacco: Never Used  Substance Use Topics  . Alcohol use: No  . Drug use: No    Family History  Problem Relation Age of Onset  . Hypertension Mother   . Heart disease Father 58       Expired  . Diabetes Neg Hx     No Known Allergies  Physical Exam: Constitutional: in no apparent distress  Vitals:   06/17/17 0438 06/17/17 1300  BP: (!) 116/48 (!) 113/48  Pulse: 63 67  Resp: 16 16  Temp: 98.6 F (37 C) 98.6 F (37 C)  SpO2: 99% 99%   EYES: anicteric ENMT: no thrush Cardiovascular: Cor RRR Respiratory: CTAB; normal respiratory effort GI: Bowel sounds are normal,morbidly obese Musculoskeletal: no pedal edema noted Skin: negatives: no rash Hematologic: no cervical lad  Lab Results  Component Value Date   WBC 6.1 06/17/2017   HGB 11.1 (L) 06/17/2017   HCT 34.3 (L) 06/17/2017   MCV 98.0 06/17/2017   PLT 302 06/17/2017    Lab Results  Component Value Date   CREATININE 0.58 06/17/2017   BUN 9 06/17/2017   NA 140 06/17/2017   K 3.6 06/17/2017   CL 103 06/17/2017   CO2 28 06/17/2017    Lab Results  Component Value Date   ALT 36 06/12/2017   AST 34 06/12/2017   ALKPHOS 101 06/12/2017     Microbiology: Recent Results (from the past 240 hour(s))  Culture, blood (Routine x  2)     Status: None   Collection Time: 06/12/17  7:15 PM  Result Value Ref Range Status   Specimen Description BLOOD RIGHT HAND  Final   Special Requests   Final    BOTTLES DRAWN AEROBIC AND ANAEROBIC Blood Culture adequate volume   Culture   Final    NO GROWTH 5 DAYS Performed at [facility], [address], [place], [place] [address]    Report Status 06/17/2017 FINAL  Final  Culture, blood (Routine x 2)     Status: None   Collection Time: 06/12/17  7:30 PM  Result Value Ref Range Status   Specimen Description BLOOD RIGHT ARM  Final   Special Requests IN PEDIATRIC BOTTLE Blood Culture adequate volume  Final   Culture   Final    NO GROWTH 5  DAYS Performed at [facility], [place] [address], [place],  [address]    Report Status 06/17/2017 FINAL  Final  Surgical pcr screen     Status: None   Collection Time: 06/13/17  3:57 AM  Result Value Ref Range Status   MRSA, PCR NEGATIVE NEGATIVE Final   Staphylococcus aureus NEGATIVE NEGATIVE Final    Comment: (NOTE) The Xpert SA Assay (FDA approved for NASAL specimens in patients 40 years of age and older), is one component of a comprehensive surveillance program. It is not intended to diagnose infection nor to guide or monitor treatment. Performed at [facility], [place] [address], [place],  [address]     Robert W Comer, [place] for Infectious Disease [facility] [email or web address] [phone] pager  [phone] cell 06/17/2017, 3:15 PM

## 2017-06-18 ENCOUNTER — Inpatient Hospital Stay (HOSPITAL_COMMUNITY): Payer: 59

## 2017-06-18 DIAGNOSIS — M869 Osteomyelitis, unspecified: Secondary | ICD-10-CM

## 2017-06-18 DIAGNOSIS — Z95828 Presence of other vascular implants and grafts: Secondary | ICD-10-CM

## 2017-06-18 LAB — CBC WITH DIFFERENTIAL/PLATELET
Band Neutrophils: 5 %
Basophils Absolute: 0 10*3/uL (ref 0.0–0.1)
Basophils Relative: 0 %
Blasts: 0 %
EOS PCT: 2 %
Eosinophils Absolute: 0.1 10*3/uL (ref 0.0–0.7)
HEMATOCRIT: 35.3 % — AB (ref 36.0–46.0)
HEMOGLOBIN: 11.2 g/dL — AB (ref 12.0–15.0)
LYMPHS ABS: 1.6 10*3/uL (ref 0.7–4.0)
Lymphocytes Relative: 23 %
MCH: 30.9 pg (ref 26.0–34.0)
MCHC: 31.7 g/dL (ref 30.0–36.0)
MCV: 97.2 fL (ref 78.0–100.0)
MYELOCYTES: 2 %
Metamyelocytes Relative: 1 %
Monocytes Absolute: 0.2 10*3/uL (ref 0.1–1.0)
Monocytes Relative: 3 %
NEUTROS PCT: 64 %
NRBC: 0 /100{WBCs}
Neutro Abs: 4.9 10*3/uL (ref 1.7–7.7)
Other: 0 %
PROMYELOCYTES ABS: 0 %
Platelets: 303 10*3/uL (ref 150–400)
RBC: 3.63 MIL/uL — AB (ref 3.87–5.11)
RDW: 14.2 % (ref 11.5–15.5)
WBC: 6.8 10*3/uL (ref 4.0–10.5)

## 2017-06-18 LAB — BASIC METABOLIC PANEL
ANION GAP: 9 (ref 5–15)
BUN: 10 mg/dL (ref 6–20)
CHLORIDE: 104 mmol/L (ref 101–111)
CO2: 26 mmol/L (ref 22–32)
Calcium: 8.4 mg/dL — ABNORMAL LOW (ref 8.9–10.3)
Creatinine, Ser: 0.59 mg/dL (ref 0.44–1.00)
GFR calc Af Amer: >60 mL/min (ref >60)
GFR calc non Af Amer: >60 mL/min (ref >60)
GLUCOSE: 100 mg/dL — AB (ref 65–99)
POTASSIUM: 3.8 mmol/L (ref 3.5–5.1)
Sodium: 139 mmol/L (ref 135–145)

## 2017-06-18 LAB — MAGNESIUM: Magnesium: 1.9 mg/dL (ref 1.7–2.4)

## 2017-06-18 MED ORDER — METRONIDAZOLE 500 MG PO TABS
500.0000 mg | ORAL_TABLET | Freq: Three times a day (TID) | ORAL | Status: DC
  Administered 2017-06-18 – 2017-06-19 (×2): 500 mg via ORAL
  Filled 2017-06-18 (×2): qty 1

## 2017-06-18 MED ORDER — SODIUM CHLORIDE 0.9 % IV SOLN
2.0000 g | INTRAVENOUS | Status: DC
  Administered 2017-06-18 – 2017-06-19 (×2): 2 g via INTRAVENOUS
  Filled 2017-06-18 (×2): qty 20

## 2017-06-18 MED ORDER — GADOBENATE DIMEGLUMINE 529 MG/ML IV SOLN
20.0000 mL | Freq: Once | INTRAVENOUS | Status: AC | PRN
  Administered 2017-06-18: 20 mL via INTRAVENOUS

## 2017-06-18 NOTE — Progress Notes (Signed)
[place] for Infectious Disease  Date of Admission:  06/12/2017             ASSESSMENT/PLAN  Abscess / Wound of Groin - Awaiting MRI. Tolerating vancomycin and cefepime with no adverse side effects. Continue with wound care recommendations. Blood cultures from 3/8 were negative. Duration of therapy pending MRI results to rule out osteomyelitis. Continue vancomycin and cefepime.   Active Problems:   Essential hypertension   T2DM (type 2 diabetes mellitus) (H[facility])   Obesity, morbid ([place])   Abscess of groin, right   Abscess of right groin   Endometrial cancer ([place])   Vulvar cancer ([place])   . heparin  5,000 Units Subcutaneous Q8H  . senna-docusate  1 tablet Oral BID  . Vitamin D (Ergocalciferol)  50,000 Units Oral Weekly    SUBJECTIVE:  Afebrile overnight with no leukocytosis. MRI of the pelvis ordered with concern for osteomyelitis remains pending. She continues to receive vancomycin and cefepime.   No Known Allergies   Review of Systems: Review of Systems  Constitutional: Negative for chills and fever.  Respiratory: Negative for cough, sputum production, shortness of breath and wheezing.   Cardiovascular: Negative for chest pain.  Gastrointestinal: Negative for constipation, diarrhea, nausea and vomiting.  Skin: Negative for rash.  Neurological: Negative for weakness.      OBJECTIVE: Vitals:   06/17/17 0438 06/17/17 1300 06/17/17 2116 06/18/17 0742  BP: (!) 116/48 (!) 113/48 (!) 101/43 (!) 110/45  Pulse: 63 67 71 61  Resp: 16 16 16 16   Temp: 98.6 F (37 C) 98.6 F (37 C) 98.6 F (37 C) 97.7 F (36.5 C)  TempSrc: Oral Oral Oral Oral  SpO2: 99% 99% 97% 100%  Weight:      Height:       Body mass index is 50.28 kg/m.  Physical Exam  Constitutional: She is oriented to person, place, and time and well-developed, well-nourished, and in no distress. No distress.  Cardiovascular: Normal rate, regular rhythm, normal heart sounds and intact distal pulses.  Exam reveals no gallop and no friction rub.  No murmur heard. Pulmonary/Chest: Effort normal and breath sounds normal. No respiratory distress. She has no wheezes. She has no rales. She exhibits no tenderness.  Abdominal: Soft. Bowel sounds are normal.  Neurological: She is alert and oriented to person, place, and time.  Skin: Skin is warm and dry.  Wound dressing is clean, dry and intact.   Psychiatric: Affect normal.    Lab Results Lab Results  Component Value Date   WBC 6.8 06/18/2017   HGB 11.2 (L) 06/18/2017   HCT 35.3 (L) 06/18/2017   MCV 97.2 06/18/2017   PLT 303 06/18/2017    Lab Results  Component Value Date   CREATININE 0.59 06/18/2017   BUN 10 06/18/2017   NA 139 06/18/2017   K 3.8 06/18/2017   CL 104 06/18/2017   CO2 26 06/18/2017    Lab Results  Component Value Date   ALT 36 06/12/2017   AST 34 06/12/2017   ALKPHOS 101 06/12/2017   BILITOT 0.5 06/12/2017     Microbiology: Recent Results (from the past 240 hour(s))  Culture, blood (Routine x 2)     Status: None   Collection Time: 06/12/17  7:15 PM  Result Value Ref Range Status   Specimen Description BLOOD RIGHT HAND  Final   Special Requests   Final    BOTTLES DRAWN AEROBIC AND ANAEROBIC Blood Culture adequate volume   Culture  Final    NO GROWTH 5 DAYS Performed at [facility], [place] [address] [place], [place] [address]    Report Status 06/17/2017 FINAL  Final  Culture, blood (Routine x 2)     Status: None   Collection Time: 06/12/17  7:30 PM  Result Value Ref Range Status   Specimen Description BLOOD RIGHT ARM  Final   Special Requests IN PEDIATRIC BOTTLE Blood Culture adequate volume  Final   Culture   Final    NO GROWTH 5 DAYS Performed at [facility], [place] [address] [place], [place] [address]    Report Status 06/17/2017 FINAL  Final  Surgical pcr screen     Status: None   Collection Time: 06/13/17  3:57 AM  Result Value Ref Range Status   MRSA, PCR NEGATIVE NEGATIVE Final    Staphylococcus aureus NEGATIVE NEGATIVE Final    Comment: (NOTE) The Xpert SA Assay (FDA approved for NASAL specimens in patients 48 years of age and older), is one component of a comprehensive surveillance program. It is not intended to diagnose infection nor to guide or monitor treatment. Performed at [facility], [place] [address], [place], [place] [address]      Terri Piedra, [place] for [facility] [phone] Pager  06/18/2017  10:13 AM

## 2017-06-18 NOTE — Progress Notes (Signed)
[facility] Infusion Coordinator is following pt with the ID team to support Home infusion Pharmacy/home IV ABX at DC for pt. [facility] will work in partnership with [facility] who is the agency of choice for pt.   If patient discharges after hours, please call [phone].   Larry Sierras 06/18/2017, 2:11 PM

## 2017-06-18 NOTE — Progress Notes (Signed)
PHARMACY CONSULT NOTE FOR:  OUTPATIENT  PARENTERAL ANTIBIOTIC THERAPY (OPAT)  Indication: pelvic osteomyelitis  Regimen: Rocephin 2gm IV Q24H and Flagyl 500mg  PO TID End date: 07/24/17  IV antibiotic discharge orders are pended. To discharging provider:  please sign these orders via discharge navigator,  Select New Orders & click on the button choice - Manage This Unsigned Work.     Thank you for allowing pharmacy to be a part of this patient's care.  Micajah Dennin D. Mina Marble, PharmD, BCPS Pager:  [phone] 06/18/2017, 5:01 PM

## 2017-06-18 NOTE — Progress Notes (Signed)
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 Associated with chills and subjective fever. Oncologist prescribed keflex which she took without any improvement and went to her PCP who sent her to the ED. Gen surg was consulted, and pt admitted for further management.  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     DM type 2 Diet controlled A1c 5.5 Monitor closely  HTN  No meds, stable  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 and radiation  Morbid obesity  DVT prophylaxis: heparin Code Status: full code Family Communication: none at bedside Disposition Plan: pending ID eval and plan for abx at d/c   Consultants:   ID  Surgery  Procedures:  I&D of recurrent soft tissue infection of right mons/perineum, examination under anesthesia 06/13/17  Antimicrobials:  Anti-infectives (From admission, onward)   Start     Dose/Rate Route Frequency Ordered Stop   06/18/17 1600  cefTRIAXone (ROCEPHIN) 2 g in sodium chloride 0.9 % 100 mL IVPB     2 g 200 mL/hr over 30 Minutes Intravenous Every 24 hours 06/18/17 1459     06/18/17 1500  metroNIDAZOLE (FLAGYL) tablet 500 mg     500 mg Oral Every 8 hours 06/18/17 1459     06/13/17 1700  vancomycin (VANCOCIN) IVPB 750 mg/150 ml premix  Status:  Discontinued     750 mg 150 mL/hr over 60 Minutes Intravenous Every 12 hours 06/13/17 0312 06/13/17 0754   06/13/17 1500  vancomycin (VANCOCIN) 1,250 mg in sodium chloride 0.9 % 250 mL IVPB  Status:  Discontinued     1,250 mg 166.7 mL/hr over 90 Minutes Intravenous Every 12 hours 06/13/17 0754 06/18/17 1459   06/13/17 1400  ceFEPIme (MAXIPIME) 2 g in sodium chloride 0.9 % 100 mL IVPB  Status:  Discontinued     2 g 200 mL/hr over 30 Minutes Intravenous Every 12 hours 06/13/17 0312 06/13/17 0755   06/13/17 0930  ceFEPIme (MAXIPIME) 2 g in sodium chloride 0.9 %  100 mL IVPB  Status:  Discontinued     2 g 200 mL/hr over 30 Minutes Intravenous Every 8 hours 06/13/17 0755 06/18/17 1459   06/13/17 0230  vancomycin (VANCOCIN) 2,500 mg in sodium chloride 0.9 % 500 mL IVPB     2,500 mg 250 mL/hr over 120 Minutes Intravenous  Once 06/13/17 0147 06/13/17 0513   06/13/17 0130  ceFEPIme (MAXIPIME) 2 g in sodium chloride 0.9 % 100 mL IVPB     2 g 200 mL/hr over 30 Minutes  Intravenous  Once 06/13/17 0121 06/13/17 0205   06/12/17 2200  clindamycin (CLEOCIN) IVPB 600 mg     600 mg 100 mL/hr over 30 Minutes Intravenous  Once 06/12/17 2149 06/13/17 0011      Subjective: Doing ok.  Has help at home.  Not interested in working with PT prior to d/c.   Objective: Vitals:   06/17/17 1300 06/17/17 2116 06/18/17 0742 06/18/17 1500  BP: (!) 113/48 (!) 101/43 (!) 110/45 129/70  Pulse: 67 71 61 73  Resp: 16 16 16 16   Temp: 98.6 F (37 C) 98.6 F (37 C) 97.7 F (36.5 C) 99 F (37.2 C)  TempSrc: Oral Oral Oral Oral  SpO2: 99% 97% 100% 93%  Weight:      Height:        Intake/Output Summary (Last 24 hours) at 06/18/2017 1628 Last data filed at 06/18/2017 1500 Gross per 24 hour  Intake 340 ml  Output -  Net 340 ml   Filed Weights   06/13/17 0230  Weight: 124.7 kg (274 lb 14.6 oz)    Examination:  General: No acute distress. obese Cardiovascular: Heart sounds show Uriyah Raska regular rate, and rhythm. No gallops or rubs. No murmurs. No JVD. Lungs: Clear to auscultation bilaterally with good air movement. No rales, rhonchi or wheezes. Abdomen: Soft, nontender, nondistended with normal active bowel sounds. No masses. No hepatosplenomegaly. Neurological: Alert and oriented 3. Moves all extremities 4. Cranial nerves II through XII grossly intact. Skin: R groin wound with drain Extremities: No clubbing or cyanosis. No edema.  Psychiatric: Mood and affect are normal. Insight and judgment are appropriate.    Data Reviewed: I have personally reviewed following labs and imaging studies  CBC: Recent Labs  Lab 06/14/17 0446 06/15/17 0406 06/16/17 0235 06/17/17 0417 06/18/17 0440  WBC 6.6 6.0 6.3 6.1 6.8  NEUTROABS 4.7 3.8 3.6 3.9 4.9  HGB 11.0* 10.5* 10.8* 11.1* 11.2*  HCT 33.6* 32.0* 33.5* 34.3* 35.3*  MCV 98.5 98.2 96.8 98.0 97.2  PLT 278 266 257 302 303   Basic Metabolic Panel: Recent Labs  Lab 06/14/17 0446 06/15/17 0406 06/16/17 0235  06/17/17 0417 06/18/17 0440  NA 139 137 138 140 139  K 3.7 3.2* 3.6 3.6 3.8  CL 107 103 102 103 104  CO2 25 26 28 28 26   GLUCOSE 109* 98 109* 101* 100*  BUN 10 8 7 9 10   CREATININE 0.62 0.55 0.61 0.58 0.59  CALCIUM 8.2* 8.0* 8.4* 8.2* 8.4*  MG  --   --   --   --  1.9   GFR: Estimated Creatinine Clearance: 93.1 mL/min (by C-G formula based on SCr of 0.59 mg/dL). Liver Function Tests: Recent Labs  Lab 06/12/17 1930  AST 34  ALT 36  ALKPHOS 101  BILITOT 0.5  PROT 7.2  ALBUMIN 3.1*   No results for input(s): LIPASE, AMYLASE in the last 168 hours. No results for input(s): AMMONIA in the last 168 hours. Coagulation Profile:  Recent Labs  Lab 06/12/17 1930  INR 1.05   Cardiac Enzymes: No results for input(s): CKTOTAL, CKMB, CKMBINDEX, TROPONINI in the last 168 hours. BNP (last 3 results) No results for input(s): PROBNP in the last 8760 hours. HbA1C: No results for input(s): HGBA1C in the last 72 hours. CBG: Recent Labs  Lab 06/13/17 0952  GLUCAP 100*   Lipid Profile: No results for input(s): CHOL, HDL, LDLCALC, TRIG, CHOLHDL, LDLDIRECT in the last 72 hours. Thyroid Function Tests: No results for input(s): TSH, T4TOTAL, FREET4, T3FREE, THYROIDAB in the last 72 hours. Anemia Panel: No results for input(s): VITAMINB12, FOLATE, FERRITIN, TIBC, IRON, RETICCTPCT in the last 72 hours. Sepsis Labs: Recent Labs  Lab 06/12/17 2017 06/12/17 2210  LATICACIDVEN 2.82* 0.89    Recent Results (from the past 240 hour(s))  Culture, blood (Routine x 2)     Status: None   Collection Time: 06/12/17  7:15 PM  Result Value Ref Range Status   Specimen Description BLOOD RIGHT HAND  Final   Special Requests   Final    BOTTLES DRAWN AEROBIC AND ANAEROBIC Blood Culture adequate volume   Culture   Final    NO GROWTH 5 DAYS Performed at [facility], [address], [place], [place] [address]    Report Status 06/17/2017 FINAL  Final  Culture, blood (Routine x 2)     Status:  None   Collection Time: 06/12/17  7:30 PM  Result Value Ref Range Status   Specimen Description BLOOD RIGHT ARM  Final   Special Requests IN PEDIATRIC BOTTLE Blood Culture adequate volume  Final   Culture   Final    NO GROWTH 5 DAYS Performed at [facility], [address] [place], [place] [address]    Report Status 06/17/2017 FINAL  Final  Surgical pcr screen     Status: None   Collection Time: 06/13/17  3:57 AM  Result Value Ref Range Status   MRSA, PCR NEGATIVE NEGATIVE Final   Staphylococcus aureus NEGATIVE NEGATIVE Final    Comment: (NOTE) The Xpert SA Assay (FDA approved for NASAL specimens in patients 64 years of age and older), is one component of Olayinka Gathers comprehensive surveillance program. It is not intended to diagnose infection nor to guide or monitor treatment. Performed at [facility], [address], [place], [place] [address]          Radiology Studies: Mr Pelvis W Wo Contrast  Result Date: 06/18/2017 CLINICAL DATA:  One week history of pain and drainage of purulent, bloody fluid from right mons and perineum, status post debridement. 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 Concern for osteomyelitis of the pubic symphysis and inferior pubic rami based on recent CT, however the patient has also had radiation in this area. EXAM: MRI PELVIS WITHOUT AND WITH CONTRAST TECHNIQUE: Multiplanar multisequence MR imaging of the pelvis was performed both before and after administration of intravenous contrast. CONTRAST:  20mL MULTIHANCE GADOBENATE DIMEGLUMINE 529 MG/ML IV SOLN COMPARISON:  CT abdomen pelvis dated June 12, 2017. FINDINGS: Urinary Tract:  No abnormality visualized. Bowel:  Unremarkable visualized pelvic bowel loops. Vascular/Lymphatic: No pathologically enlarged lymph nodes. No significant vascular abnormality seen. Reproductive: Prior hysterectomy. No mass or other significant abnormality.  Other:  None. Musculoskeletal: There are extensive soft tissue inflammatory changes surrounding the pubic symphysis, with prominent marrow edema, enhancement, and corresponding T1 hypointensity, with areas of bony destruction and erosion involving the bilateral inferior  pubic rami, with sparing of the ischial tuberosities. There is diastasis of the pubic symphysis secondary to bony destruction and erosion. At the site of the pubic symphysis, there is Khameron Gruenwald 1.1 x 2.1 x 1.7 cm (AP by transverse by CC) rim enhancing fluid collection, consistent with abscess. This lies just anterior to, but does not clearly involve, the proximal urethra. There is Prayan Ulin fistulous, fluid-filled connection extending from the anterior right inferior pubic ramus to the right mons pubis (series 5, image 23). There is mild edema within the bilateral adductor musculature. IMPRESSION: 1. Extensive osteomyelitis of the pubic symphysis and bilateral inferior pubic rami, sparing the ischial tuberosities. Resultant diastasis of the pubic symphysis secondary to bony destruction and erosion. 2. Small 2.1 cm abscess at the site of the eroded pubic symphysis. 3. Extensive soft tissue inflammatory changes surrounding the pubic symphysis with fistulous, fluid-filled connection extending from the anterior aspect of the right inferior pubic ramus to the right mons pubis. 4. Infectious myositis of the adjacent bilateral adductor musculature. Electronically Signed   By: Obie Dredge M.D.   On: 06/18/2017 14:23        Scheduled Meds: . heparin  5,000 Units Subcutaneous Q8H  . metroNIDAZOLE  500 mg Oral Q8H  . senna-docusate  1 tablet Oral BID  . Vitamin D (Ergocalciferol)  50,000 Units Oral Weekly   Continuous Infusions: . cefTRIAXone (ROCEPHIN)  IV    . lactated ringers 10 mL/hr at 06/13/17 1037     LOS: 5 days    Time spent: over 30 min    Lacretia Nicks, MD [facility] Pager [phone]  If 7PM-7AM, please contact  night-coverage [email or web address] Password [facility] 06/18/2017, 4:28 PM

## 2017-06-19 LAB — BASIC METABOLIC PANEL
Anion gap: 8 (ref 5–15)
BUN: 8 mg/dL (ref 6–20)
CHLORIDE: 106 mmol/L (ref 101–111)
CO2: 26 mmol/L (ref 22–32)
Calcium: 8.1 mg/dL — ABNORMAL LOW (ref 8.9–10.3)
Creatinine, Ser: 0.51 mg/dL (ref 0.44–1.00)
GFR calc Af Amer: >60 mL/min (ref >60)
GFR calc non Af Amer: >60 mL/min (ref >60)
Glucose, Bld: 95 mg/dL (ref 65–99)
POTASSIUM: 3.4 mmol/L — AB (ref 3.5–5.1)
SODIUM: 140 mmol/L (ref 135–145)

## 2017-06-19 LAB — CBC WITH DIFFERENTIAL/PLATELET
Basophils Absolute: 0 10*3/uL (ref 0.0–0.1)
Basophils Relative: 0 %
EOS ABS: 0.2 10*3/uL (ref 0.0–0.7)
Eosinophils Relative: 4 %
HCT: 33.5 % — ABNORMAL LOW (ref 36.0–46.0)
HEMOGLOBIN: 10.7 g/dL — AB (ref 12.0–15.0)
LYMPHS PCT: 27 %
Lymphs Abs: 1.7 10*3/uL (ref 0.7–4.0)
MCH: 31.1 pg (ref 26.0–34.0)
MCHC: 31.9 g/dL (ref 30.0–36.0)
MCV: 97.4 fL (ref 78.0–100.0)
Monocytes Absolute: 0.5 10*3/uL (ref 0.1–1.0)
Monocytes Relative: 9 %
NEUTROS ABS: 3.7 10*3/uL (ref 1.7–7.7)
NEUTROS PCT: 60 %
Platelets: 283 10*3/uL (ref 150–400)
RBC: 3.44 MIL/uL — AB (ref 3.87–5.11)
RDW: 14.3 % (ref 11.5–15.5)
WBC: 6.1 10*3/uL (ref 4.0–10.5)

## 2017-06-19 MED ORDER — CEFTRIAXONE IV (FOR PTA / DISCHARGE USE ONLY): 2.0000 g | INTRAVENOUS | 0 refills | Status: DC

## 2017-06-19 MED ORDER — HEPARIN SOD (PORK) LOCK FLUSH 100 UNIT/ML IV SOLN: 500.0000 [IU] | INTRAVENOUS | Status: DC | PRN

## 2017-06-19 MED ORDER — METRONIDAZOLE 500 MG PO TABS: 500.0000 mg | ORAL_TABLET | Freq: Three times a day (TID) | ORAL | 0 refills | Status: AC

## 2017-06-19 MED ORDER — OXYCODONE HCL 5 MG PO TABS: 5.0000 mg | ORAL_TABLET | Freq: Four times a day (QID) | ORAL | 0 refills | Status: AC | PRN

## 2017-06-19 NOTE — Discharge Summary (Signed)
Physician Discharge Summary  Brandy [facility] ION:[id number] DOB: Sep 05, 1955 DOA: 06/12/2017  PCP: Clovis Riley, L.August Saucer, MD  Admit date: 06/12/2017 Discharge date: 06/19/2017  Time spent: over 30 minutes  Recommendations for Outpatient Follow-up:  1. Follow up outpatient CBC/CMP.  Follow up weekly CBC with diff and BMP.  Follow up biweekly ESR/CRP.  2. Follow up with surgery as scheduled 3. Follow up with ID, plan for repeat MRI once abx course complete 4. Follow up with oncology at [facility] 5. Follow pain. PMP aware reviewed and appropriate.  Discharged with #20 5 mg oxycodone.   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 Associated with chills and subjective fever. Oncologist prescribed keflex which she took without any improvement and went to her PCP who sent her to the ED. Gen surg was consulted, and pt admitted for further management.  She was taken to OR on 3/9 for I&D and examination under anesthesia (no overt fistulous opening into visual vaginal wall).  ID was consulted due to evidence of osteomyelitis on CT imaging (which was chronic).  MRI confirmed  presence of osteo as well as extensive soft tissue inflammatory changes, small abscess, and infectious myositis (see report) and she was converted from vanc/cefepime to ceftriaxone/flagyl for a 6 week course for osteomyelitis.  Will need follow up with surgery, ID, and oncology as an outpatient.   Hospital Course:  Abscess of the right mons/perineum, recurrent S/P I & D on 06/13/17 Afebrile with no leukocytosis CRP 13.2-->3.7 LA 2.82-->0.89 Vanc and cefepime, narrowed to ceftriaxone/flagyl with ID assistance Continue local wound care, BID wet to dry dressing changes CT showed soft tissue defect, possible fistula to bladder or vagina, and possible osteomyelitis of pubic symphysis (see report) Follow up with surgery as an outpatient   Chronic osteomyelitis of the pubic symphysis Osteomyelitis Ongoing for years, last seen by ortho in [facility] on 10/2016, rec a conservative approach due to concerns of poor wound healing due to hx of radiation to the area MRI notable for extensive osteomylelitis, small 2.1 cm abscess, extensive soft tissue inflammatory changes, infectious myositis (see report) Appreciate ID recs -> ceftriaxone/flagyl for 6 weeks and follow up MRI post treatment.   DM type 2 Diet controlled A1c 5.5 Monitor closely  HTN No meds, stable  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 and radiation  Morbid obesity  Procedures: I&D of recurrent soft tissue infection of right mons/perineum, examination under anesthesia 06/13/17  Anti-infectives (From admission, onward)   Start     Dose/Rate Route Frequency Ordered Stop   06/19/17 0000  cefTRIAXone (ROCEPHIN) IVPB     2 g Intravenous Every 24 hours 06/19/17 1120     06/19/17 0000  metroNIDAZOLE (FLAGYL) 500 MG tablet     500 mg Oral 3 times daily 06/19/17 1120 07/25/17 2359   06/18/17 1600  cefTRIAXone (ROCEPHIN) 2 g in sodium chloride 0.9 % 100 mL IVPB     2 g 200 mL/hr over 30 Minutes  Intravenous Every 24 hours 06/18/17 1459     06/18/17 1500  metroNIDAZOLE (FLAGYL) tablet 500 mg     500 mg Oral Every 8 hours 06/18/17 1459     06/13/17 1700  vancomycin (VANCOCIN) IVPB 750 mg/150 ml premix  Status:  Discontinued     750 mg 150 mL/hr over 60 Minutes Intravenous Every 12 hours 06/13/17 0312 06/13/17 0754   06/13/17 1500  vancomycin (VANCOCIN) 1,250 mg in sodium chloride 0.9 % 250 mL IVPB  Status:  Discontinued     1,250 mg 166.7 mL/hr over 90 Minutes Intravenous Every 12 hours 06/13/17 0754 06/18/17 1459   06/13/17 1400  ceFEPIme (MAXIPIME) 2 g in sodium chloride 0.9 % 100 mL IVPB  Status:  Discontinued     2 g 200 mL/hr over 30 Minutes Intravenous Every 12 hours 06/13/17 0312 06/13/17 0755   06/13/17 0930  ceFEPIme (MAXIPIME) 2 g in sodium chloride 0.9 % 100 mL IVPB  Status:  Discontinued     2 g 200 mL/hr over 30 Minutes Intravenous Every 8 hours 06/13/17 0755 06/18/17 1459   06/13/17 0230  vancomycin (VANCOCIN) 2,500 mg in sodium chloride 0.9 % 500 mL IVPB     2,500 mg 250 mL/hr over 120 Minutes Intravenous  Once 06/13/17 0147 06/13/17 0513   06/13/17 0130  ceFEPIme (MAXIPIME) 2 g in sodium chloride 0.9 % 100 mL IVPB     2 g 200 mL/hr over 30 Minutes Intravenous  Once 06/13/17 0121 06/13/17 0205   06/12/17 2200  clindamycin (CLEOCIN) IVPB 600 mg     600 mg 100 mL/hr over 30 Minutes Intravenous  Once 06/12/17 2149 06/13/17 0011       Consultations:  ID  Surgery  Discharge Exam: Vitals:   06/18/17 2018 06/19/17 2024  BP: (!) 98/31 (!) 104/50  Pulse: 63 75  Resp:  16  Temp: 98.3 F (36.8 C) 98.6 F (37 C)  SpO2: 94% 92%   Some pain at wound.  Otherwise doing well.  General: No acute distress. Cardiovascular: Heart sounds show a regular rate, and rhythm. No gallops or rubs. No murmurs. No JVD. Lungs: Clear to auscultation bilaterally with good air movement. No rales, rhonchi or wheezes. Abdomen: Soft, nontender, nondistended with normal active bowel  sounds. No masses. No hepatosplenomegaly. Neurological: Alert and oriented 3. Moves all extremities 4. Cranial nerves II through XII grossly intact. Skin: R groin wound packed with drain, no purulent drainage Extremities: No clubbing or cyanosis. No edema. Psychiatric: Mood and affect are normal. Insight and judgment are appropriate.   Discharge Instructions   Discharge Instructions    Call MD for:  difficulty breathing, headache or visual disturbances   Complete by:  As directed    Call MD for:  extreme fatigue   Complete by:  As directed    Call MD for:  persistant dizziness or light-headedness   Complete by:  As directed    Call MD for:  persistant nausea and vomiting   Complete by:  As directed    Call MD for:  redness, tenderness, or signs of infection (pain, swelling, redness, odor or green/yellow discharge around incision site)   Complete by:  As directed    Call  MD for:  severe uncontrolled pain   Complete by:  As directed    Call MD for:  temperature >100.4   Complete by:  As directed    Diet - low sodium heart healthy   Complete by:  As directed    Discharge instructions   Complete by:  As directed    You were seen for a pelvic abscess.  You also have a bone infection.  You were treated surgically for your abscess, but will need a prolonged antibiotic course for the bone infection and residual infection after surgery.  You'll have 6 weeks of ceftriaxone and flagyl.  You'll need weekly labs while on these medications.  You will likely need repeat imaging after these antibiotics.   Please follow up with surgery, infectious disease, your PCP, and your oncologist.    While you are on flagyl (metronidazole) you should not drink any alcohol.    Return if you have new, recurrent, or worsening symptoms.    Continue wound care as instructed by surgery (wet to dry dressing changes twice daily).  Please ask your PCP to request records from this hospitalization so they know  what was done and what the next steps are.   Discharge wound care:   Complete by:  As directed    Dry ABD over the Penrose drain site.  Wet-to-dry dressing the lower open incision.  Pack wet-to-dry with Kerlix twice daily.   Home infusion instructions [facility] May follow [facility] Dosing Protocol; May administer Cathflo as needed to maintain patency of vascular access device.; Flushing of vascular access device: per [facility] Protocol: 0.9% NaCl pre/post medica...   Complete by:  As directed    Instructions:  May follow [facility] Dosing Protocol   Instructions:  May administer Cathflo as needed to maintain patency of vascular access device.   Instructions:  Flushing of vascular access device: per [facility] Protocol: 0.9% NaCl pre/post medication administration and prn patency; Heparin 100 u/ml, 5ml for implanted ports and Heparin 10u/ml, 5ml for all other central venous catheters.   Instructions:  May follow [facility] Anaphylaxis Protocol for First Dose Administration in the home: 0.9% NaCl at 25-50 ml/hr to maintain IV access for protocol meds. Epinephrine 0.3 ml IV/IM PRN and Benadryl 25-50 IV/IM PRN s/s of anaphylaxis.   Instructions:  [facility] Infusion Coordinator (RN) to assist per patient IV care needs in the home PRN.   Increase activity slowly   Complete by:  As directed      Allergies as of 06/19/2017   No Known Allergies     Medication List    STOP taking these medications   cephALEXin 500 MG capsule Commonly known as:  KEFLEX     TAKE these medications   ALEVE 220 MG Caps Generic drug:  Naproxen Sodium Take 220 mg by mouth as needed.   ALPRAZolam 0.25 MG tablet Commonly known as:  XANAX Take 1 tablet (0.25 mg total) by mouth at bedtime as needed for anxiety.   ascorbic acid 500 MG tablet Commonly known as:  VITAMIN C Take 1 tablet (500 mg total) by mouth 2 (two) times daily.   atorvastatin 40 MG tablet Commonly known as:  LIPITOR TAKE 1 TABLET DAILY What  changed:    how much to take  how to take this  when to take this   cefTRIAXone IVPB Commonly known as:  ROCEPHIN Inject 2 g into the vein daily. Indication:  Pelvic osteomyelitis  Last Day of Therapy:  07/24/17 Labs - Once weekly:  CBC/D and BMP, Labs - Every other week:  ESR and CRP   metroNIDAZOLE 500 MG tablet Commonly known as:  FLAGYL Take 1 tablet (500 mg total) by mouth 3 (three) times daily.   multivitamin with minerals Tabs tablet Take 1 tablet by mouth daily.   oxyCODONE 5 MG immediate release tablet Commonly known as:  Oxy IR/ROXICODONE Take 1 tablet (5 mg total) by mouth every 6 (six) hours as needed for up to 5 days for severe pain.   polyethylene glycol packet Commonly known as:  MIRALAX / GLYCOLAX Take 17 g by mouth daily.   PREMARIN vaginal cream Generic drug:  conjugated estrogens Place 1 application vaginally as needed.   PROBIOTIC DAILY PO Take 1 tablet by mouth daily.   Vitamin D (Ergocalciferol) 50000 units Caps capsule Commonly known as:  DRISDOL TAKE 1 CAPSULE WEEKLY            Home Infusion Instuctions  (From admission, onward)        Start     Ordered   06/19/17 0000  Home infusion instructions [facility] May follow [facility] Dosing Protocol; May administer Cathflo as needed to maintain patency of vascular access device.; Flushing of vascular access device: per [facility] Protocol: 0.9% NaCl pre/post medica...    Question Answer Comment  Instructions May follow [facility] Dosing Protocol   Instructions May administer Cathflo as needed to maintain patency of vascular access device.   Instructions Flushing of vascular access device: per [facility] Protocol: 0.9% NaCl pre/post medication administration and prn patency; Heparin 100 u/ml, 5ml for implanted ports and Heparin 10u/ml, 5ml for all other central venous catheters.   Instructions May follow [facility] Anaphylaxis Protocol for First Dose Administration in the home: 0.9% NaCl at 25-50 ml/hr  to maintain IV access for protocol meds. Epinephrine 0.3 ml IV/IM PRN and Benadryl 25-50 IV/IM PRN s/s of anaphylaxis.   Instructions [facility] Infusion Coordinator (RN) to assist per patient IV care needs in the home PRN.      06/19/17 1120       Discharge Care Instructions  (From admission, onward)        Start     Ordered   06/19/17 0000  Discharge wound care:    Comments:  Dry ABD over the Penrose drain site.  Wet-to-dry dressing the lower open incision.  Pack wet-to-dry with Kerlix twice daily.   06/19/17 1120     No Known Allergies Follow-up Information    Care, [facility] Follow up.   Specialty:  Family Medicine       Care, [facility] Follow up.   Specialty:  Home Health Services Why:  A representative from [facility] will contact you to arrange start date and time for Home Health Nurse.  Contact information: [address] [place] [address] [phone]        Berna Bue, MD. Go on 07/02/2017.   Specialty:  General Surgery Why:  Your appointment is 07/02/17 at 11:55AM. Please arrive 15 minutes prior to your appointment to check in and fill out paperwork. Bring photo ID and insurance information. Contact information: [phone]        [facility] Follow up.   Specialty:  Infectious Diseases Why:  Follow up in 3-4 weeks with Dr. Luciana Axe or Marcos Eke, NP Contact information: [address] [place], [place] [address] [phone] mc [place] [address]  [phone]       Clovis Riley, L.August Saucer, MD Follow up.   Specialty:  Family Medicine Contact information: [address] [place] [address] [phone]        Ronita Hipps, MD Follow up.   Specialty:  Obstetrics and Gynecology Contact information: [id number] Physician Office [place] [address] [phone]        [facility], [facility] Follow up.    Specialty:  Home Health Services Why:  For home infusion supplies Contact information: [address] [place] [address] [phone]            The results of significant diagnostics from this hospitalization (including imaging, microbiology, ancillary and laboratory) are listed below for reference.    Significant Diagnostic Studies: Dg Chest 2 View  Result Date: 06/12/2017 CLINICAL DATA:  Possible sepsis. EXAM: CHEST - 2 VIEW COMPARISON:  02/14/2016 and prior chest radiographs FINDINGS: RIGHT IJ Port-A-Cath with tip overlying the SUPERIOR cavoatrial junction again noted. The cardiomediastinal silhouette is unremarkable. There is no evidence of focal airspace disease, pulmonary edema, suspicious pulmonary nodule/mass, pleural effusion, or pneumothorax. No acute bony abnormalities are identified. IMPRESSION: No active cardiopulmonary disease. Electronically Signed   By: Harmon Pier M.D.   On: 06/12/2017 19:50   Mr Pelvis W Wo Contrast  Result Date: 06/18/2017 CLINICAL DATA:  One week history of pain and drainage of purulent, bloody fluid from right mons and perineum, status post debridement. 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 Concern for osteomyelitis of the pubic symphysis and inferior pubic rami based on recent CT, however the patient has also had radiation in this area. EXAM: MRI PELVIS WITHOUT AND WITH CONTRAST TECHNIQUE: Multiplanar multisequence MR imaging of the pelvis was performed both before and after administration of intravenous contrast. CONTRAST:  20mL MULTIHANCE GADOBENATE DIMEGLUMINE 529 MG/ML IV SOLN COMPARISON:  CT abdomen pelvis dated June 12, 2017. FINDINGS: Urinary Tract:  No abnormality visualized. Bowel:  Unremarkable visualized pelvic bowel loops. Vascular/Lymphatic: No pathologically enlarged lymph nodes. No significant vascular abnormality seen. Reproductive: Prior hysterectomy. No mass  or other significant abnormality. Other:  None. Musculoskeletal: There are extensive soft tissue inflammatory changes surrounding the pubic symphysis, with prominent marrow edema, enhancement, and corresponding T1 hypointensity, with areas of bony destruction and erosion involving the bilateral inferior pubic rami, with sparing of the ischial tuberosities. There is diastasis of the pubic symphysis secondary to bony destruction and erosion. At the site of the pubic symphysis, there is a 1.1 x 2.1 x 1.7 cm (AP by transverse by CC) rim enhancing fluid collection, consistent with abscess. This lies just anterior to, but does not clearly involve, the proximal urethra. There is a fistulous, fluid-filled connection extending from the anterior right inferior pubic ramus to the right mons pubis (series 5, image 23). There is mild edema within the bilateral adductor musculature. IMPRESSION: 1. Extensive osteomyelitis of the pubic symphysis and bilateral inferior pubic rami, sparing the ischial tuberosities. Resultant diastasis of the pubic symphysis secondary to bony destruction and erosion. 2. Small 2.1 cm abscess at the site of the eroded pubic symphysis. 3. Extensive soft tissue inflammatory changes surrounding the pubic symphysis with fistulous, fluid-filled connection extending from the anterior aspect of the right inferior pubic ramus to the right mons pubis. 4. Infectious myositis of the adjacent bilateral adductor musculature. Electronically Signed   By: Obie Dredge M.D.   On: 06/18/2017 14:23   Ct Abdomen Pelvis  W Contrast  Result Date: 06/12/2017 CLINICAL DATA:  Acute onset of right groin pain. Right groin boil has popped, oozing pus. EXAM: CT ABDOMEN AND PELVIS WITH CONTRAST TECHNIQUE: Multidetector CT imaging of the abdomen and pelvis was performed using the standard protocol following bolus administration of intravenous contrast. CONTRAST:  ISOVUE-300 IOPAMIDOL (ISOVUE-300) INJECTION 61% COMPARISON:   CT of the abdomen and pelvis performed 02/14/2016 FINDINGS: Lower chest: The visualized lung bases are grossly clear. The visualized portions of the mediastinum are unremarkable. Hepatobiliary: The liver is unremarkable in appearance. The gallbladder is unremarkable in appearance. The common bile duct remains normal in caliber. Pancreas: The pancreas is within normal limits. Spleen: The spleen is unremarkable in appearance. Adrenals/Urinary Tract: The adrenal glands are unremarkable in appearance. A small right renal cyst is noted. Mild nonspecific perinephric stranding is noted bilaterally. There is no evidence of hydronephrosis. No renal or ureteral stones are identified. Stomach/Bowel: The stomach is unremarkable in appearance. The small bowel is within normal limits. The appendix is normal in caliber, without evidence of appendicitis. Mild scattered diverticulosis is noted along the ascending colon, without evidence of diverticulitis. Vascular/Lymphatic: Scattered calcification is seen along the abdominal aorta and its branches. The abdominal aorta is otherwise grossly unremarkable. The inferior vena cava is grossly unremarkable. Small retroperitoneal nodes are likely within normal limits. No pelvic sidewall lymphadenopathy is identified. Reproductive: Soft tissue inflammation is noted about the bladder, with asymmetric right-sided bladder wall thickening. There is an unusual pattern of air about the level of the urethra and vaginal vault. The patient's right inguinal soft tissue defect demonstrates diffuse surrounding soft tissue inflammation and underlying fluid. Trace associated air tracks about the right pubic rami and pubic symphysis, with multiple erosions about the pubic symphysis and inferior pubic rami bilaterally, compatible with diffuse osteomyelitis. There is associated diastasis of the pubic symphysis. A fistulous connection from the soft tissue defect to the bladder or urethra/vaginal vault cannot  be excluded. Other: Soft tissue inflammation extends about the anterior lower abdominal wall. Musculoskeletal: No acute osseous abnormalities are identified. The visualized musculature is unremarkable in appearance. IMPRESSION: 1. Right inguinal soft tissue defect may have a fistulous connection to the bladder or urethra/vaginal vault, not well characterized on this study. Underlying fluid noted within the soft tissue defect. Asymmetric right-sided bladder wall thickening. Unusual pattern of air about the level of the urethra and vaginal vault may reflect an underlying fistula. Minimal air tracking about the right pubic rami and pubic symphysis. 2. Diastasis of the pubic symphysis, with multiple erosions noted about the pubic symphysis and inferior pubic rami bilaterally, compatible with diffuse osteomyelitis. 3. Soft tissue inflammation extends about the anterior lower abdominal wall. 4. Mild scattered diverticulosis along the ascending colon, without evidence of diverticulitis. 5. Small right renal cyst. Aortic Atherosclerosis (ICD10-I70.0). Electronically Signed   By: Roanna Raider M.D.   On: 06/12/2017 23:50    Microbiology: Recent Results (from the past 240 hour(s))  Culture, blood (Routine x 2)     Status: None   Collection Time: 06/12/17  7:15 PM  Result Value Ref Range Status   Specimen Description BLOOD RIGHT HAND  Final   Special Requests   Final    BOTTLES DRAWN AEROBIC AND ANAEROBIC Blood Culture adequate volume   Culture   Final    NO GROWTH 5 DAYS Performed at [facility], [address], [place], [place] [address]    Report Status 06/17/2017 FINAL  Final  Culture, blood (Routine x  2)     Status: None   Collection Time: 06/12/17  7:30 PM  Result Value Ref Range Status   Specimen Description BLOOD RIGHT ARM  Final   Special Requests IN PEDIATRIC BOTTLE Blood Culture adequate volume  Final   Culture   Final    NO GROWTH 5 DAYS Performed at [facility], [address] [place], [place] [address]    Report Status 06/17/2017 FINAL  Final  Surgical pcr screen     Status: None   Collection Time: 06/13/17  3:57 AM  Result Value Ref Range Status   MRSA, PCR NEGATIVE NEGATIVE Final   Staphylococcus aureus NEGATIVE NEGATIVE Final    Comment: (NOTE) The Xpert SA Assay (FDA approved for NASAL specimens in patients 62 years of age and older), is one component of a comprehensive surveillance program. It is not intended to diagnose infection nor to guide or monitor treatment. Performed at [facility], [address], [place], [place] [address]      Labs: Basic Metabolic Panel: Recent Labs  Lab 06/15/17 0406 06/16/17 0235 06/17/17 0417 06/18/17 0440 06/19/17 0447  NA 137 138 140 139 140  K 3.2* 3.6 3.6 3.8 3.4*  CL 103 102 103 104 106  CO2 26 28 28 26 26   GLUCOSE 98 109* 101* 100* 95  BUN 8 7 9 10 8   CREATININE 0.55 0.61 0.58 0.59 0.51  CALCIUM 8.0* 8.4* 8.2* 8.4* 8.1*  MG  --   --   --  1.9  --    Liver Function Tests: Recent Labs  Lab 06/12/17 1930  AST 34  ALT 36  ALKPHOS 101  BILITOT 0.5  PROT 7.2  ALBUMIN 3.1*   No results for input(s): LIPASE, AMYLASE in the last 168 hours. No results for input(s): AMMONIA in the last 168 hours. CBC: Recent Labs  Lab 06/15/17 0406 06/16/17 0235 06/17/17 0417 06/18/17 0440 06/19/17 0447  WBC 6.0 6.3 6.1 6.8 6.1  NEUTROABS 3.8 3.6 3.9 4.9 3.7  HGB 10.5* 10.8* 11.1* 11.2* 10.7*  HCT 32.0* 33.5* 34.3* 35.3* 33.5*  MCV 98.2 96.8 98.0 97.2 97.4  PLT 266 257 302 303 283   Cardiac Enzymes: No results for input(s): CKTOTAL, CKMB, CKMBINDEX, TROPONINI in the last 168 hours. BNP: BNP (last 3 results) No results for input(s): BNP in the last 8760 hours.  ProBNP (last 3 results) No results for input(s): PROBNP in the last 8760 hours.  CBG: Recent Labs  Lab 06/13/17 0952  GLUCAP 100*       Signed:  Lacretia Nicks MD.  [facility] 06/19/2017, 8:27 PM

## 2017-06-19 NOTE — Care Management Note (Signed)
Case Management Note  Patient Details  Name: Brandy Hardy MRN: [id number] Date of Birth: 1955-12-29  Subjective/Objective:          Spoke w Carolynn Sayers, [facility] home infusions liaison,  She stated she is meeting with patient and [facility] provider today at 12:30 to review all home needs prior to DC. No other CM needs.          Action/Plan:   Expected Discharge Date:  06/19/17               Expected Discharge Plan:  [place]  In-House Referral:  NA  Discharge planning Services  CM Consult  Post Acute Care Choice:  Home Health Choice offered to:  Patient  DME Arranged:  IV pump/equipment DME Agency:  [place] Arranged:  RN [facility] Agency:  [place]  Status of Service:  Completed, signed off  If discussed at [place] of Stay Meetings, dates discussed:    Additional Comments:  Carles Collet, RN 06/19/2017, 11:30 AM

## 2017-06-19 NOTE — Progress Notes (Signed)
Call dr for pain medication per patient.

## 2017-06-19 NOTE — Progress Notes (Signed)
[facility] Progress Note  6 Days Post-Op  Subjective: CC-  No complaints this morning. Tolerating dressing changes well. Started on rocephin/flagyl per ID yesterday for 6 weeks due to underlying osteomyelitis.  Objective: Vital signs in last 24 hours: Temp:  [98.3 F (36.8 C)-99 F (37.2 C)] 98.3 F (36.8 C) (03/14 2018) Pulse Rate:  [63-73] 63 (03/14 2018) Resp:  [16] 16 (03/14 1500) BP: (98-129)/(31-70) 98/31 (03/14 2018) SpO2:  [93 %-94 %] 94 % (03/14 2018) Last BM Date: 06/17/17  Intake/Output from previous day: 03/14 0701 - 03/15 0700 In: 570 [P.O.:360; I.V.:10; IV Piggyback:200] Out: -  Intake/Output this shift: No intake/output data recorded.  PE: Gen:  Alert, NAD, pleasant HEENT: EOM's intact, pupils equal and round Pulm:  effort normal GU:     Lab Results:  Recent Labs    06/18/17 0440 06/19/17 0447  WBC 6.8 6.1  HGB 11.2* 10.7*  HCT 35.3* 33.5*  PLT 303 283   BMET Recent Labs    06/18/17 0440 06/19/17 0447  NA 139 140  K 3.8 3.4*  CL 104 106  CO2 26 26  GLUCOSE 100* 95  BUN 10 8  CREATININE 0.59 0.51  CALCIUM 8.4* 8.1*   PT/INR No results for input(s): LABPROT, INR in the last 72 hours. CMP     Component Value Date/Time   NA 140 06/19/2017 0447   K 3.4 (L) 06/19/2017 0447   CL 106 06/19/2017 0447   CO2 26 06/19/2017 0447   GLUCOSE 95 06/19/2017 0447   BUN 8 06/19/2017 0447   CREATININE 0.51 06/19/2017 0447   CALCIUM 8.1 (L) 06/19/2017 0447   PROT 7.2 06/12/2017 1930   ALBUMIN 3.1 (L) 06/12/2017 1930   AST 34 06/12/2017 1930   ALT 36 06/12/2017 1930   ALKPHOS 101 06/12/2017 1930   BILITOT 0.5 06/12/2017 1930   GFRNONAA >60 06/19/2017 0447   GFRAA >60 06/19/2017 0447   Lipase  No results found for: LIPASE     Studies/Results: Mr Pelvis W Wo Contrast  Result Date: 06/18/2017 CLINICAL DATA:  One week history of pain and drainage of purulent, bloody fluid from right mons and perineum, status post  debridement. 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 Concern for osteomyelitis of the pubic symphysis and inferior pubic rami based on recent CT, however the patient has also had radiation in this area. EXAM: MRI PELVIS WITHOUT AND WITH CONTRAST TECHNIQUE: Multiplanar multisequence MR imaging of the pelvis was performed both before and after administration of intravenous contrast. CONTRAST:  75mL MULTIHANCE GADOBENATE DIMEGLUMINE 529 MG/ML IV SOLN COMPARISON:  CT abdomen pelvis dated June 12, 2017. FINDINGS: Urinary Tract:  No abnormality visualized. Bowel:  Unremarkable visualized pelvic bowel loops. Vascular/Lymphatic: No pathologically enlarged lymph nodes. No significant vascular abnormality seen. Reproductive: Prior hysterectomy. No mass or other significant abnormality. Other:  None. Musculoskeletal: There are extensive soft tissue inflammatory changes surrounding the pubic symphysis, with prominent marrow edema, enhancement, and corresponding T1 hypointensity, with areas of bony destruction and erosion involving the bilateral inferior pubic rami, with sparing of the ischial tuberosities. There is diastasis of the pubic symphysis secondary to bony destruction and erosion. At the site of the pubic symphysis, there is a 1.1 x 2.1 x 1.7 cm (AP by transverse by CC) rim enhancing fluid collection, consistent with abscess. This lies just anterior to, but does not clearly involve, the proximal urethra. There is a fistulous, fluid-filled connection extending from the anterior  right inferior pubic ramus to the right mons pubis (series 5, image 23). There is mild edema within the bilateral adductor musculature. IMPRESSION: 1. Extensive osteomyelitis of the pubic symphysis and bilateral inferior pubic rami, sparing the ischial tuberosities. Resultant diastasis of the pubic symphysis secondary to bony destruction and erosion. 2. Small 2.1 cm abscess at the  site of the eroded pubic symphysis. 3. Extensive soft tissue inflammatory changes surrounding the pubic symphysis with fistulous, fluid-filled connection extending from the anterior aspect of the right inferior pubic ramus to the right mons pubis. 4. Infectious myositis of the adjacent bilateral adductor musculature. Electronically Signed   By: Titus Dubin M.D.   On: 06/18/2017 14:23    Anti-infectives: Anti-infectives (From admission, onward)   Start     Dose/Rate Route Frequency Ordered Stop   06/18/17 1600  cefTRIAXone (ROCEPHIN) 2 g in sodium chloride 0.9 % 100 mL IVPB     2 g 200 mL/hr over 30 Minutes Intravenous Every 24 hours 06/18/17 1459     06/18/17 1500  metroNIDAZOLE (FLAGYL) tablet 500 mg     500 mg Oral Every 8 hours 06/18/17 1459     06/13/17 1700  vancomycin (VANCOCIN) IVPB 750 mg/150 ml premix  Status:  Discontinued     750 mg 150 mL/hr over 60 Minutes Intravenous Every 12 hours 06/13/17 0312 06/13/17 0754   06/13/17 1500  vancomycin (VANCOCIN) 1,250 mg in sodium chloride 0.9 % 250 mL IVPB  Status:  Discontinued     1,250 mg 166.7 mL/hr over 90 Minutes Intravenous Every 12 hours 06/13/17 0754 06/18/17 1459   06/13/17 1400  ceFEPIme (MAXIPIME) 2 g in sodium chloride 0.9 % 100 mL IVPB  Status:  Discontinued     2 g 200 mL/hr over 30 Minutes Intravenous Every 12 hours 06/13/17 0312 06/13/17 0755   06/13/17 0930  ceFEPIme (MAXIPIME) 2 g in sodium chloride 0.9 % 100 mL IVPB  Status:  Discontinued     2 g 200 mL/hr over 30 Minutes Intravenous Every 8 hours 06/13/17 0755 06/18/17 1459   06/13/17 0230  vancomycin (VANCOCIN) 2,500 mg in sodium chloride 0.9 % 500 mL IVPB     2,500 mg 250 mL/hr over 120 Minutes Intravenous  Once 06/13/17 0147 06/13/17 0513   06/13/17 0130  ceFEPIme (MAXIPIME) 2 g in sodium chloride 0.9 % 100 mL IVPB     2 g 200 mL/hr over 30 Minutes Intravenous  Once 06/13/17 0121 06/13/17 0205   06/12/17 2200  clindamycin (CLEOCIN) IVPB 600 mg     600 mg 100  mL/hr over 30 Minutes Intravenous  Once 06/12/17 2149 06/13/17 0011       Assessment/Plan History of cervical adenocarcinoma stage II with a history of chemo and radiation therapy. Necrotizing fasciitis with irrigation and drainage right groin abscess 11/11, and 02/20/2016 Type 2 diabetes Hypertension Morbid obesity - BMI 50  Likely underlying osteomyelitis of pubic symphysis and bilateral pubic rami Recurrent abscess of mons/perineum;no obvious communication with vagina S/pincision and debridement of recurrent soft tissue infection of right mons/perineum, examination under anesthesia, 06/13/17, Dr.Chelsea Hardy - POD 6 - blood cx negative -continue dressing changes  FEN: Carb mod diet ID: rocephin/flagyl 3/14>>, Cefepime/Vancomycin 3/9>>3/14 DVT: Heparin Follow up: Dr. Kae Heller  Plan: Patient stable for discharge from surgical standpoint. Continue local wound care with BID wet to dry dressing changes and antibiotics. Antibiotics per ID for underlying osteomyelitis (6 weeks of IV therapy with ceftriaxone 2 grams qd and oral flagyl 500 mg three  TID). Follow up with Dr. Kae Heller. F/u appointment and discharge instructions on AVS.   LOS: 6 days    Wellington Hampshire , [facility] 06/19/2017, 9:06 AM Pager: [phone] Consults: [phone] Mon-Fri 7:00 am-4:30 pm Sat-Sun 7:00 am-11:30 am

## 2017-06-19 NOTE — Progress Notes (Signed)
  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 Baseline CRP of 3.7 and ESR of 66.  Recommend 6 weeks of IV therapy with ceftriaxone and metronidazole (500 mg tid) with end date of 07/24/17. Home health has already been established and OPAT orders are placed. She has a port in place so no PICC needed. We will plan to follow up with her in 3-4 weeks in the ID office.   Abscess / wound - Stable with current treatments. Recommend continued wound care. Ceftriaxone and metronidazole should provide fairly broad coverage for wound if infection is present.    Active Problems:   Essential hypertension   T2DM (type 2 diabetes mellitus) (HCC)   Obesity, morbid (HCC)   Abscess of groin, right   Abscess of right groin   Endometrial cancer (HCC)   Vulvar cancer (HCC)   . heparin  5,000 Units Subcutaneous Q8H  . metroNIDAZOLE  500 mg Oral Q8H  . senna-docusate  1 tablet Oral BID  . Vitamin D (Ergocalciferol)  50,000 Units Oral Weekly    SUBJECTIVE:  MRI of the pelvis completed 3/14 showing extensive osteomyelitis of the pubic symphysis and bilateral inferior pubic rami.  Also noted a small 2.1 cm abscess at the site of the eroded pubic symphysis with extensive soft tissue inflammatory changes surrounding the pubic symphysis with fistulous, fluid-filled connection extending from the anterior aspect of the right inferior pubic ramus into the right mons pubis.  She has remained afebrile with no leukocytosis.  Currently receiving metronidazole and ceftriaxone which was changed yesterday.  Denies pain other than dressing changes.  Denies fevers, chills, night sweats, nausea, vomiting, or diarrhea.   No Known Allergies   Review of Systems: Review of Systems  Constitutional: Negative for chills, fever and malaise/fatigue.  Respiratory: Negative for cough, shortness of breath  and wheezing.   Cardiovascular: Negative for chest pain.  Genitourinary: Negative for dysuria, flank pain, frequency, hematuria and urgency.  Skin: Negative for rash.  Neurological: Negative for weakness.      OBJECTIVE: Vitals:   06/17/17 2116 06/18/17 0742 06/18/17 1500 06/18/17 2018  BP: (!) 101/43 (!) 110/45 129/70 (!) 98/31  Pulse: 71 61 73 63  Resp: 16 16 16   Temp: 98.6 F (37 C) 97.7 F (36.5 C) 99 F (37.2 C) 98.3 F (36.8 C)  TempSrc: Oral Oral Oral Oral  SpO2: 97% 100% 93% 94%  Weight:      Height:       Body mass index is 50.28 kg/m.  Physical Exam  Constitutional: She is oriented to person, place, and time. No distress.  Lying in bed appearing comfortable.  Pleasant.  Cardiovascular: Normal rate, regular rhythm, normal heart sounds and intact distal pulses. Exam reveals no gallop and no friction rub.  No murmur heard. Pulmonary/Chest: Effort normal and breath sounds normal. No respiratory distress. She has no wheezes. She has no rales. She exhibits no tenderness.  Abdominal: Soft. Bowel sounds are normal.  Neurological: She is alert and oriented to person, place, and time.  Skin: Skin is warm and dry.  Psychiatric: Affect normal.    Lab Results Lab Results  Component Value Date   WBC 6.1 06/19/2017   HGB 10.7 (L) 06/19/2017   HCT 33.5 (L) 06/19/2017   MCV 97.4 06/19/2017   PLT 283 06/19/2017    Lab Results    Component Value Date   CREATININE 0.51 06/19/2017   BUN 8 06/19/2017   NA 140 06/19/2017   K 3.4 (L) 06/19/2017   CL 106 06/19/2017   CO2 26 06/19/2017    Lab Results  Component Value Date   ALT 36 06/12/2017   AST 34 06/12/2017   ALKPHOS 101 06/12/2017   BILITOT 0.5 06/12/2017     Microbiology: Recent Results (from the past 240 hour(s))  Culture, blood (Routine x 2)     Status: None   Collection Time: 06/12/17  7:15 PM  Result Value Ref Range Status   Specimen Description BLOOD RIGHT HAND  Final   Special Requests   Final     BOTTLES DRAWN AEROBIC AND ANAEROBIC Blood Culture adequate volume   Culture   Final    NO GROWTH 5 DAYS Performed at [facility], [address] [place], [place] [address]    Report Status 06/17/2017 FINAL  Final  Culture, blood (Routine x 2)     Status: None   Collection Time: 06/12/17  7:30 PM  Result Value Ref Range Status   Specimen Description BLOOD RIGHT ARM  Final   Special Requests IN PEDIATRIC BOTTLE Blood Culture adequate volume  Final   Culture   Final    NO GROWTH 5 DAYS Performed at [facility], [place] [address], [place], [place] [address]    Report Status 06/17/2017 FINAL  Final  Surgical pcr screen     Status: None   Collection Time: 06/13/17  3:57 AM  Result Value Ref Range Status   MRSA, PCR NEGATIVE NEGATIVE Final   Staphylococcus aureus NEGATIVE NEGATIVE Final    Comment: (NOTE) The Xpert SA Assay (FDA approved for NASAL specimens in patients 73 years of age and older), is one component of a comprehensive surveillance program. It is not intended to diagnose infection nor to guide or monitor treatment. Performed at [facility], [place] [address] [place], [place] [address]      Terri Piedra, [place] [facility] [place] [phone] Pager  06/19/2017  9:21 AM

## 2017-07-13 DIAGNOSIS — M86151 Other acute osteomyelitis, right femur: Secondary | ICD-10-CM | POA: Insufficient documentation

## 2017-07-13 NOTE — Progress Notes (Signed)
Subjective:    Patient ID: Brandy Hardy, female    DOB: [birth year], 62 y.o.   MRN: [id number]  Chief Complaint  Patient presents with  . Hospitalization Follow-up    Nursing coming today 1pm for lab draw    HPI:  Brandy Hardy is a 62 y.o. female who presents today for an initial office visit following  hospitalization for a pelvic wound/abscess and osteomyelitis.   Brandy Hardy has a history of endometrial adenocarcinoma with extension to the vagina and is status post chemo and radiation in remission with a history of necrotizing fasciitis treated with Zosyn and fluconazole for 3 weeks who was admitted to [facility] with an abscess.  The abscess did not go down to the bone and was debrided by surgery with no growth on culture.  MRI of the pelvis was consistent with osteomyelitis with questionable soft tissue infections.  She was started on ceftriaxone and Flagyl for 6 weeks with recommendation for follow-up MRI toward the end of treatment.  All hospital records, labs, and imaging reviewed in detail.  Since leaving the hospital, she reports taking the ceftriaxone and Flagyl as prescribed with no adverse side effects.  Her last C-reactive protein was 3.7 on 06/16/17. Denies fevers, chills, night sweats, or pelvic pain. Reports her wound is nearly completed closed at this time. She continues to receive her medication through her port.    No Known Allergies    Outpatient Medications Prior to Visit  Medication Sig Dispense Refill  . atorvastatin (LIPITOR) 40 MG tablet TAKE 1 TABLET DAILY (Patient taking differently: TAKE 40 mg  TABLET DAILY) 90 tablet 0  . cefTRIAXone (ROCEPHIN) IVPB Inject 2 g into the vein daily. Indication:  Pelvic osteomyelitis  Last Day of Therapy:  07/24/17 Labs - Once weekly:  CBC/D and BMP, Labs - Every other week:  ESR and CRP 33 Units 0  . metroNIDAZOLE (FLAGYL) 500 MG tablet Take 1 tablet (500 mg total) by mouth 3 (three) times daily. 108 tablet 0  . Multiple  Vitamin (MULTIVITAMIN WITH MINERALS) TABS tablet Take 1 tablet by mouth daily. 30 tablet 0  . oxyCODONE-acetaminophen (PERCOCET/ROXICET) 5-325 MG tablet Take 2 tablets by mouth every 4 (four) hours as needed for severe pain.    . Probiotic Product (PROBIOTIC DAILY PO) Take 1 tablet by mouth daily.     . Vitamin D, Ergocalciferol, (DRISDOL) 50000 units CAPS capsule TAKE 1 CAPSULE WEEKLY 13 capsule 0  . ALPRAZolam (XANAX) 0.25 MG tablet Take 1 tablet (0.25 mg total) by mouth at bedtime as needed for anxiety. (Patient not taking: Reported on 06/12/2017) 30 tablet 0  . Lactobacillus-Inulin (CULTURELLE DIGESTIVE HEALTH) CAPS Take by mouth.    . Naproxen Sodium (ALEVE) 220 MG CAPS Take 220 mg by mouth as needed.    Marland Kitchen PREMARIN vaginal cream Place 1 application vaginally as needed.    . polyethylene glycol (MIRALAX / GLYCOLAX) packet Take 17 g by mouth daily.    . vitamin C (VITAMIN C) 500 MG tablet Take 1 tablet (500 mg total) by mouth 2 (two) times daily. (Patient not taking: Reported on 06/12/2017) 60 tablet 1   No facility-administered medications prior to visit.      Past Medical History:  Diagnosis Date  . Cervical adenocarcinoma (HCC)    stage II  . Endometrial cancer (HCC)    with recurrance at introitus   . History of prediabetes   . Recurrent vaginal cancer (HCC)     chemo 2015 and XRT  2016  . Renal disorder       Past Surgical History:  Procedure Laterality Date  . ABDOMINAL HYSTERECTOMY    . I&D EXTREMITY Right 02/20/2016   Procedure: IRRIGATION AND DEBRIDEMENT EXTREMITY RIGHT UPPER THIGH;  Surgeon: Claud Kelp, MD;  Location: [facility] OR;  Service: General;  Laterality: Right;  . INCISION AND DRAINAGE PERIRECTAL ABSCESS N/A 06/13/2017   Procedure: IRRIGATION AND DEBRIDEMENT OF MONS PUBIS ABSCESS;  Surgeon: Berna Bue, MD;  Location: [facility] OR;  Service: General;  Laterality: N/A;  . IRRIGATION AND DEBRIDEMENT ABSCESS Right 02/16/2016   Procedure: IRRIGATION AND DEBRIDEMENT RIGHT  GROIN ABSCESS, FULL THICKNESS DEBRIDMENT RIGHT GROIN INCLUDING > 40CM SQUARE.;  Surgeon: Rodman Pickle, MD;  Location: [facility] OR;  Service: General;  Laterality: Right;  . IRRIGATION AND DEBRIDEMENT ABSCESS Right 02/20/2016   Procedure: IRRIGATION AND DEBRIDEMENT ABSCESS;  Surgeon: Claud Kelp, MD;  Location: [facility] OR;  Service: General;  Laterality: Right;  Perii-Vaginal Abscess      Family History  Problem Relation Age of Onset  . Hypertension Mother   . Heart disease Father 49       Expired  . Diabetes Neg Hx       Social History   Socioeconomic History  . Marital status: Married    Spouse name: Not on file  . Number of children: Not on file  . Years of education: Not on file  . Highest education level: Not on file  Occupational History  . Not on file  Social Needs  . Financial resource strain: Not on file  . Food insecurity:    Worry: Not on file    Inability: Not on file  . Transportation needs:    Medical: Not on file    Non-medical: Not on file  Tobacco Use  . Smoking status: Never Smoker  . Smokeless tobacco: Never Used  Substance and Sexual Activity  . Alcohol use: No  . Drug use: No  . Sexual activity: Not on file  Lifestyle  . Physical activity:    Days per week: Not on file    Minutes per session: Not on file  . Stress: Not on file  Relationships  . Social connections:    Talks on phone: Not on file    Gets together: Not on file    Attends religious service: Not on file    Active member of club or organization: Not on file    Attends meetings of clubs or organizations: Not on file    Relationship status: Not on file  . Intimate partner violence:    Fear of current or ex partner: Not on file    Emotionally abused: Not on file    Physically abused: Not on file    Forced sexual activity: Not on file  Other Topics Concern  . Not on file  Social History Narrative  . Not on file      Review of Systems  Constitutional: Negative for chills,  fever and unexpected weight change.  Respiratory: Negative for chest tightness and shortness of breath.   Cardiovascular: Negative for chest pain.       Objective:    BP 118/77   Pulse 73   Temp 98.1 F (36.7 C) (Oral)   Ht 5\' 2"  (1.575 m)   Wt 278 lb (126.1 kg)   BMI 50.85 kg/m  Nursing note and vital signs reviewed.  Physical Exam  Constitutional: She is oriented to person, place, and time. She appears well-developed and  well-nourished. No distress.  Cardiovascular: Normal rate, regular rhythm, normal heart sounds and intact distal pulses.  Pulmonary/Chest: Effort normal and breath sounds normal.  Neurological: She is alert and oriented to person, place, and time.  Skin: Skin is warm and dry.  Psychiatric: She has a normal mood and affect. Her behavior is normal. Judgment and thought content normal.        Assessment & Plan:   Problem List Items Addressed This Visit      Musculoskeletal and Integument   Acute osteomyelitis of pelvic region, right [facility])    Brandy Hardy continues to take her antibiotics as prescribed with no adverse side effects. Overall she is doing well with good wound closure. She completes her therapy on 4/19. We will check her inflammatory markers. If they look good no additional treatment will be necessary at this time as she will have completed 6 weeks of therapy with ceftriaxone. Advised to follow up if pain or worsening symptoms develop pending lab work results.           I have discontinued Brandy Hardy's ascorbic acid and polyethylene glycol. I am also having her maintain her Probiotic Product (PROBIOTIC DAILY PO), multivitamin with minerals, atorvastatin, Vitamin D (Ergocalciferol), ALPRAZolam, PREMARIN, Naproxen Sodium, cefTRIAXone, metroNIDAZOLE, CULTURELLE DIGESTIVE HEALTH, and oxyCODONE-acetaminophen.   Follow-up: Return if symptoms worsen or fail to improve.   Jeanine Luz, FNP [facility]

## 2017-07-20 ENCOUNTER — Other Ambulatory Visit: Payer: Self-pay

## 2017-07-20 ENCOUNTER — Ambulatory Visit (INDEPENDENT_AMBULATORY_CARE_PROVIDER_SITE_OTHER): Payer: 59 | Admitting: Family

## 2017-07-20 ENCOUNTER — Other Ambulatory Visit: Payer: Self-pay | Admitting: Internal Medicine

## 2017-07-20 ENCOUNTER — Encounter: Payer: Self-pay | Admitting: Family

## 2017-07-20 DIAGNOSIS — M86151 Other acute osteomyelitis, right femur: Secondary | ICD-10-CM

## 2017-07-20 NOTE — Patient Instructions (Signed)
Nice to see you!  Glad that you are doing well.   Please complete your therapy as discussed.   We are here if you need [place]!

## 2017-07-20 NOTE — Assessment & Plan Note (Signed)
Brandy Hardy continues to take her antibiotics as prescribed with no adverse side effects. Overall she is doing well with good wound closure. She completes her therapy on 4/19. We will check her inflammatory markers. If they look good no additional treatment will be necessary at this time as she will have completed 6 weeks of therapy with ceftriaxone. Advised to follow up if pain or worsening symptoms develop pending lab work results.

## 2017-07-21 ENCOUNTER — Telehealth: Payer: Self-pay

## 2017-07-21 NOTE — Telephone Encounter (Signed)
Sharyn Lull from [facility] care called today asking if pt should have huber needle left in her port after last day of antibiotics and if she should continue with labs after lose dose of antibiotics or d/c.  Per Marya Amsler, NP it is okay to pull huber needle out on 07/24/17 unless oncology needs to still have the pt on it.  [place]

## 2017-07-21 NOTE — Telephone Encounter (Signed)
Spoke with Sharyn Lull at [facility].  Confirmed last day of antibiotics 4/19, no additional labs needed after yesterday's labs, ok to d/c huber needle per Terri Piedra.  Same orders confirmed with Coretta at [facility] on 4/15. Landis Gandy, RN

## 2017-07-23 LAB — SEDIMENTATION RATE: Sed Rate: 63 mm/hr

## 2017-07-23 LAB — C-REACTIVE PROTEIN: CRP: 1.2 mg/L (ref 0.0–4.9)

## 2017-07-31 ENCOUNTER — Other Ambulatory Visit: Payer: Self-pay | Admitting: Pharmacist

## 2017-12-29 IMAGING — CT CT RENAL STONE PROTOCOL
2 of 5 series · 16 of 46 positions shown, 18 images · non-contrast
Comparison: 04/21/2007

CLINICAL DATA: Lower abdominal pain for several weeks with history
of UTI

EXAM:
CT ABDOMEN AND PELVIS WITHOUT CONTRAST
TECHNIQUE: Multidetector CT imaging of the abdomen and pelvis was performed
following the standard protocol without IV contrast.

[Series 2: stone study 5.0 i30f 1 · axial · 0.98mm/px · z∈[+879,+1309]mm · 13 of 96 slices shown, 15 images]
[im 5/96  soft-tissue]
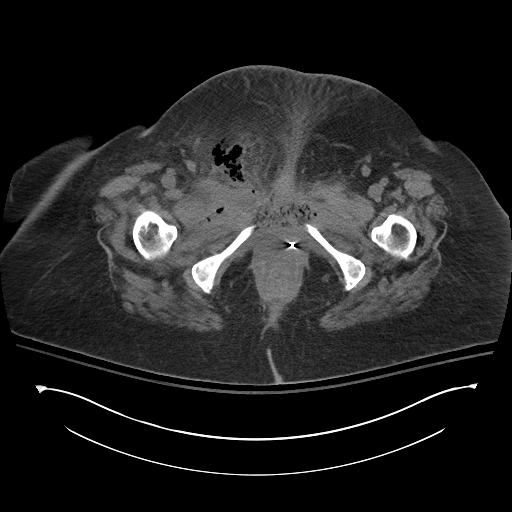
[im 5/96  bone]
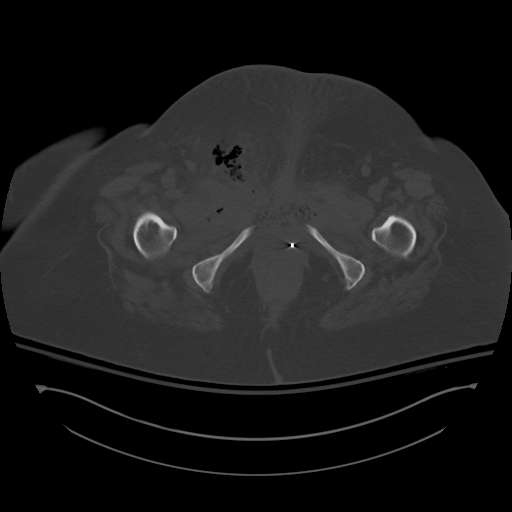
[im 13/96  soft-tissue]
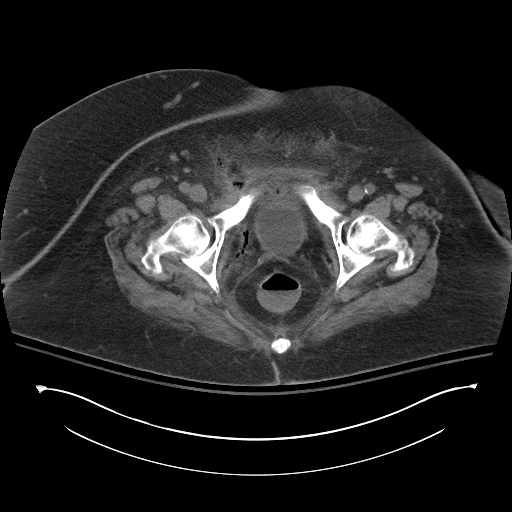
[im 22/96  soft-tissue]
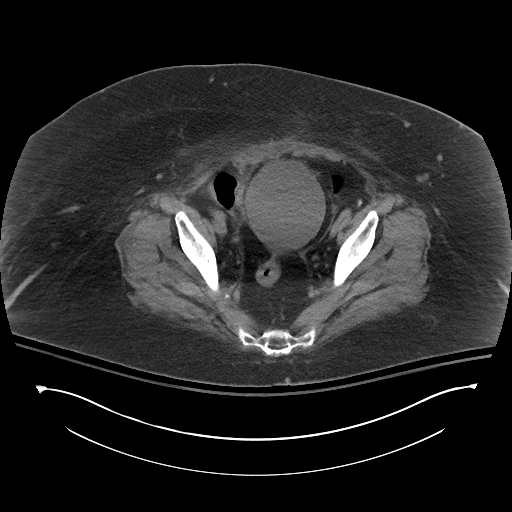
[im 26/96  soft-tissue]
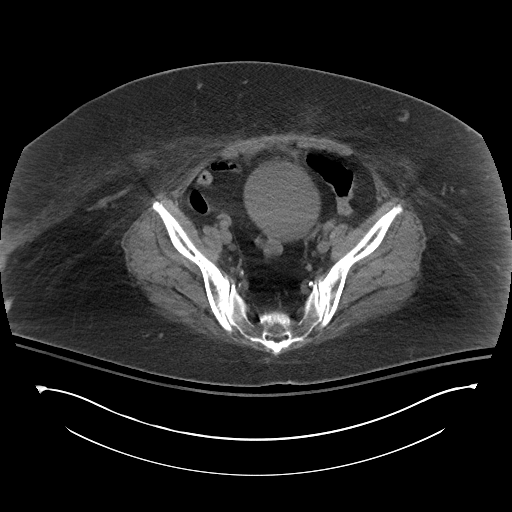
[im 35/96  soft-tissue]
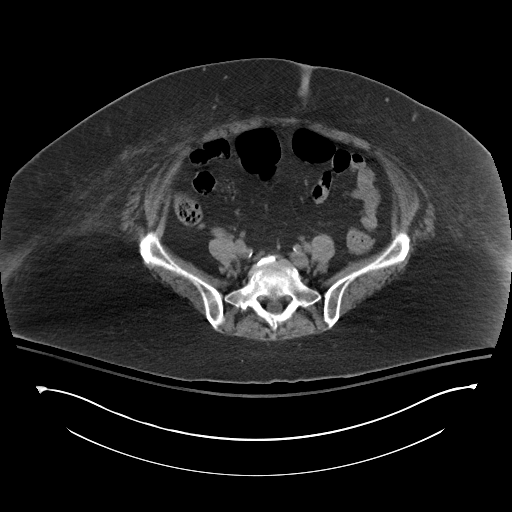
[im 39/96  soft-tissue]
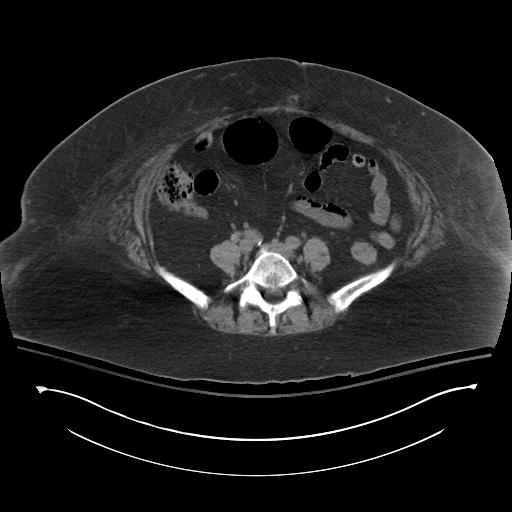
[im 48/96  soft-tissue]
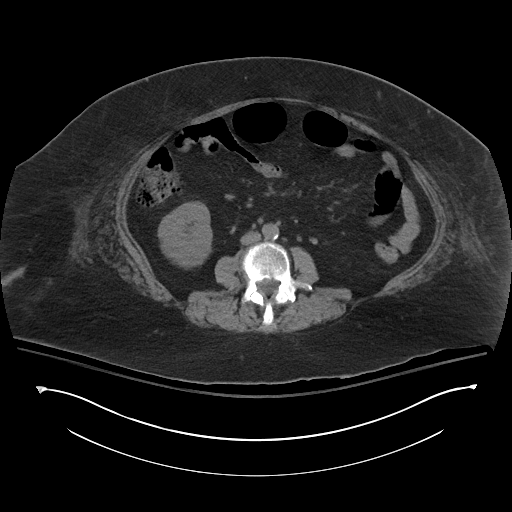
[im 57/96  soft-tissue]
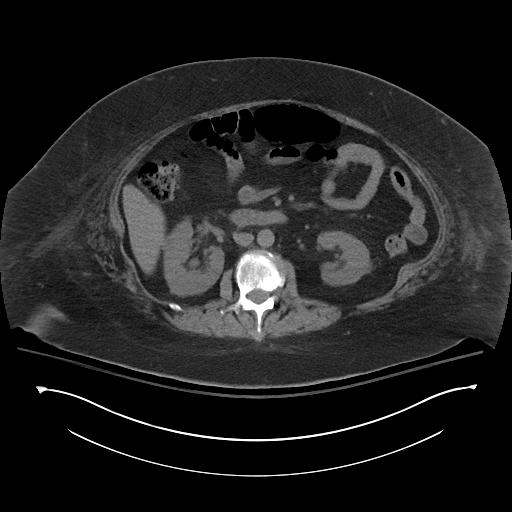
[im 61/96  soft-tissue]
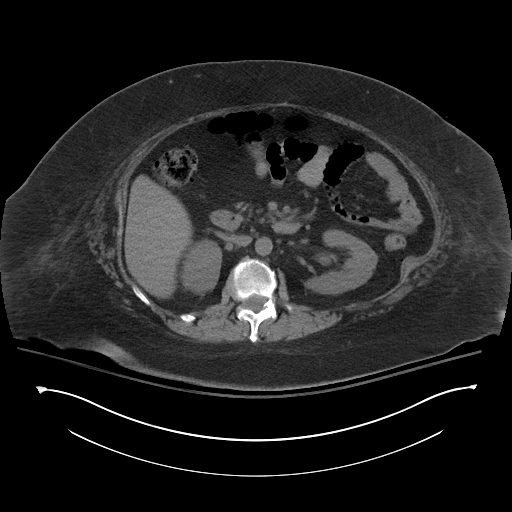
[im 61/96  bone]
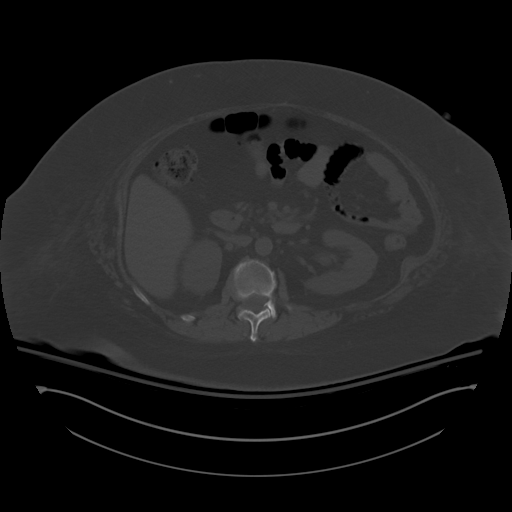
[im 70/96  soft-tissue]
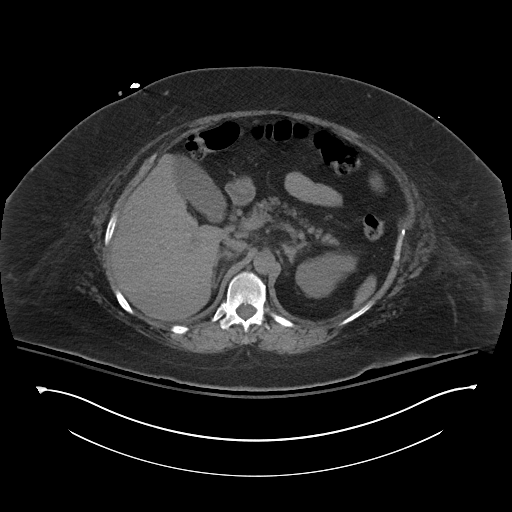
[im 74/96  soft-tissue]
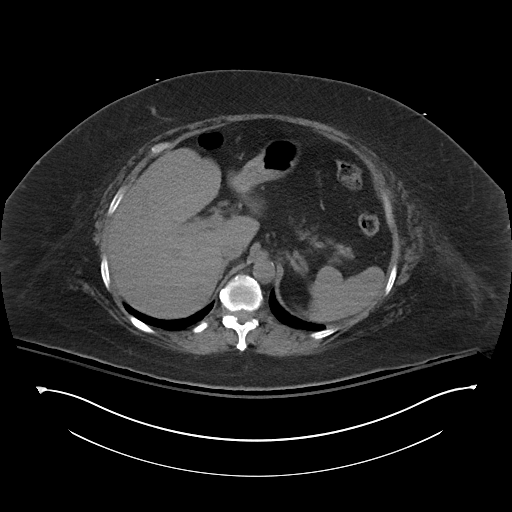
[im 83/96  soft-tissue]
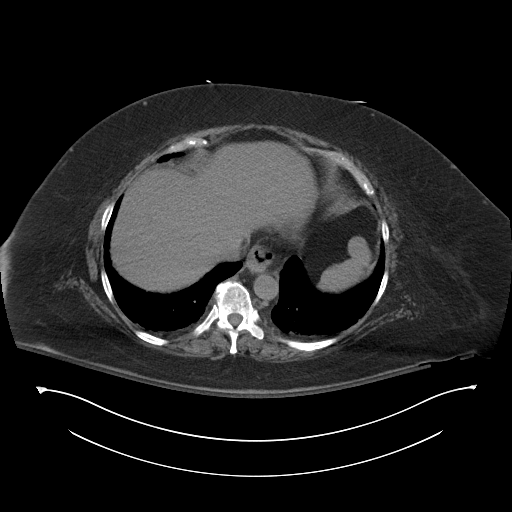
[im 91/96  soft-tissue]
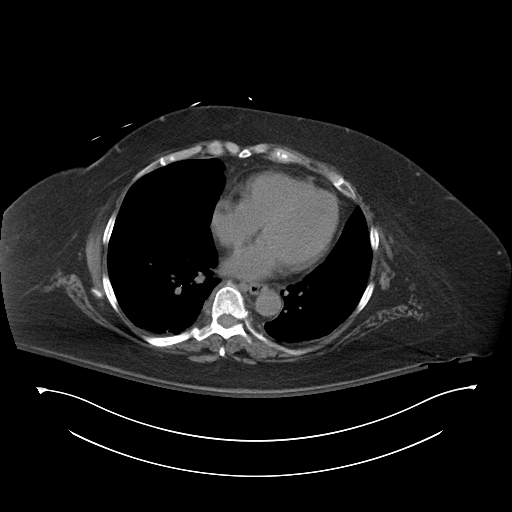

[Series 602: coronals · coronal · 0.98mm/px · 3 of 96 slices shown]
[im 32/96  soft-tissue]
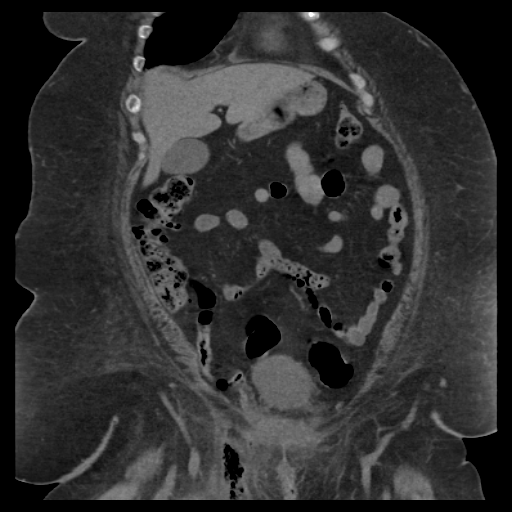
[im 43/96  soft-tissue]
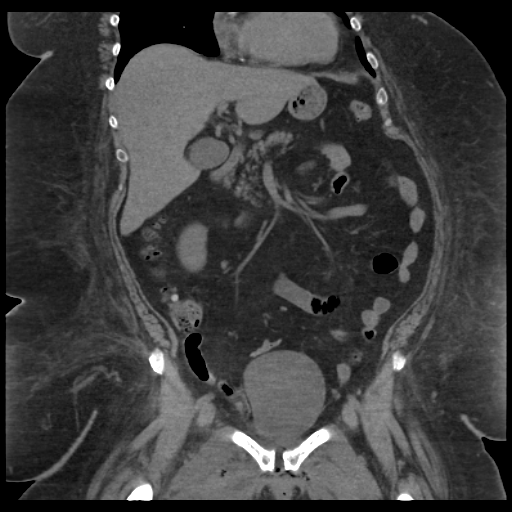
[im 53/96  soft-tissue]
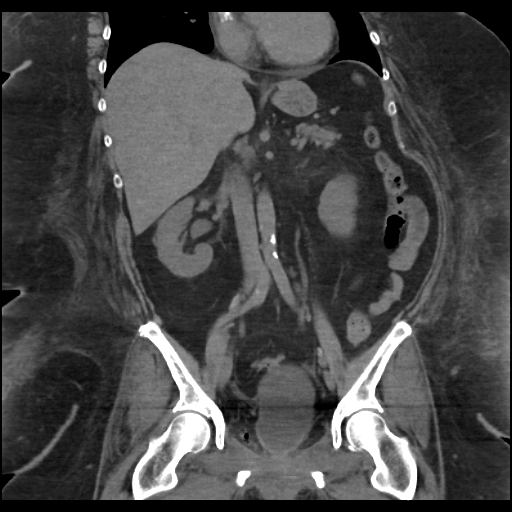

[16 of 46 positions shown; findings below may reference images not displayed]

FINDINGS: Lower chest: No acute abnormality.

Hepatobiliary: The liver is within normal limits. The gallbladder is
well distended with a few small dependent gallstones.

Pancreas: Within normal limits.

Spleen: Within normal limits.

Adrenals/Urinary Tract: The adrenal glands are unremarkable. The
left kidney demonstrates no renal calculi. The ureter is well
visualized and a tiny calcific density is noted adjacent to the
distal left ureter best seen on image number 80 of series 2. This
appears to represent a nonobstructing distal ureteral stone. The
right kidney demonstrates mild fullness of the collecting system
although no definitive calculus is seen. The bladder is partially
distended.

Stomach/Bowel: Diverticular change is seen without diverticulosis.
The appendix is within normal limits.

Vascular/Lymphatic: Aortic atherosclerosis. No enlarged abdominal or
pelvic lymph nodes.

Reproductive: Status post hysterectomy. No adnexal masses.

Other: In the anterior pelvic wall just below the pubic symphysis
and to the right of the midline there is a mottled collection of air
within the subcutaneous fat with surrounding inflammatory changes.
This extends into the adjacent pelvic musculature bilaterally
slightly greater on the right than the left. Additionally this air
tracks superiorly into the pubic symphysis. No definitive fluid
collection is identified. The air also tracks into the low pelvis on
the right adjacent to the urinary bladder.

Musculoskeletal: Degenerative changes of the lumbar spine are noted.
No other focal abnormality is seen.
IMPRESSION: Changes in the low pelvis anteriorly as described. This likely
represents an area of subcutaneous cellulitis with early abscess
formation. No definitive fluid collection is noted. The air in
inflammatory change tracks into musculature of the upper thigh
bilaterally as well as towards the pubic symphysis and into the
right hemipelvis adjacent to the bladder. These changes may be
related to the patient's recent UTI.

Distal left ureteral stone without significant obstructive change.

Mild fullness of the right collecting system without definitive
calculus.

No evidence of osteomyelitis is noted.

## 2017-12-29 IMAGING — DX DG CHEST 1V
1 series · 1 of 1 positions shown · non-contrast
Comparison: Chest radiograph 02/21/2009

CLINICAL DATA: Urinary tract infection.

EXAM:
CHEST 1 VIEW

[chest ap]
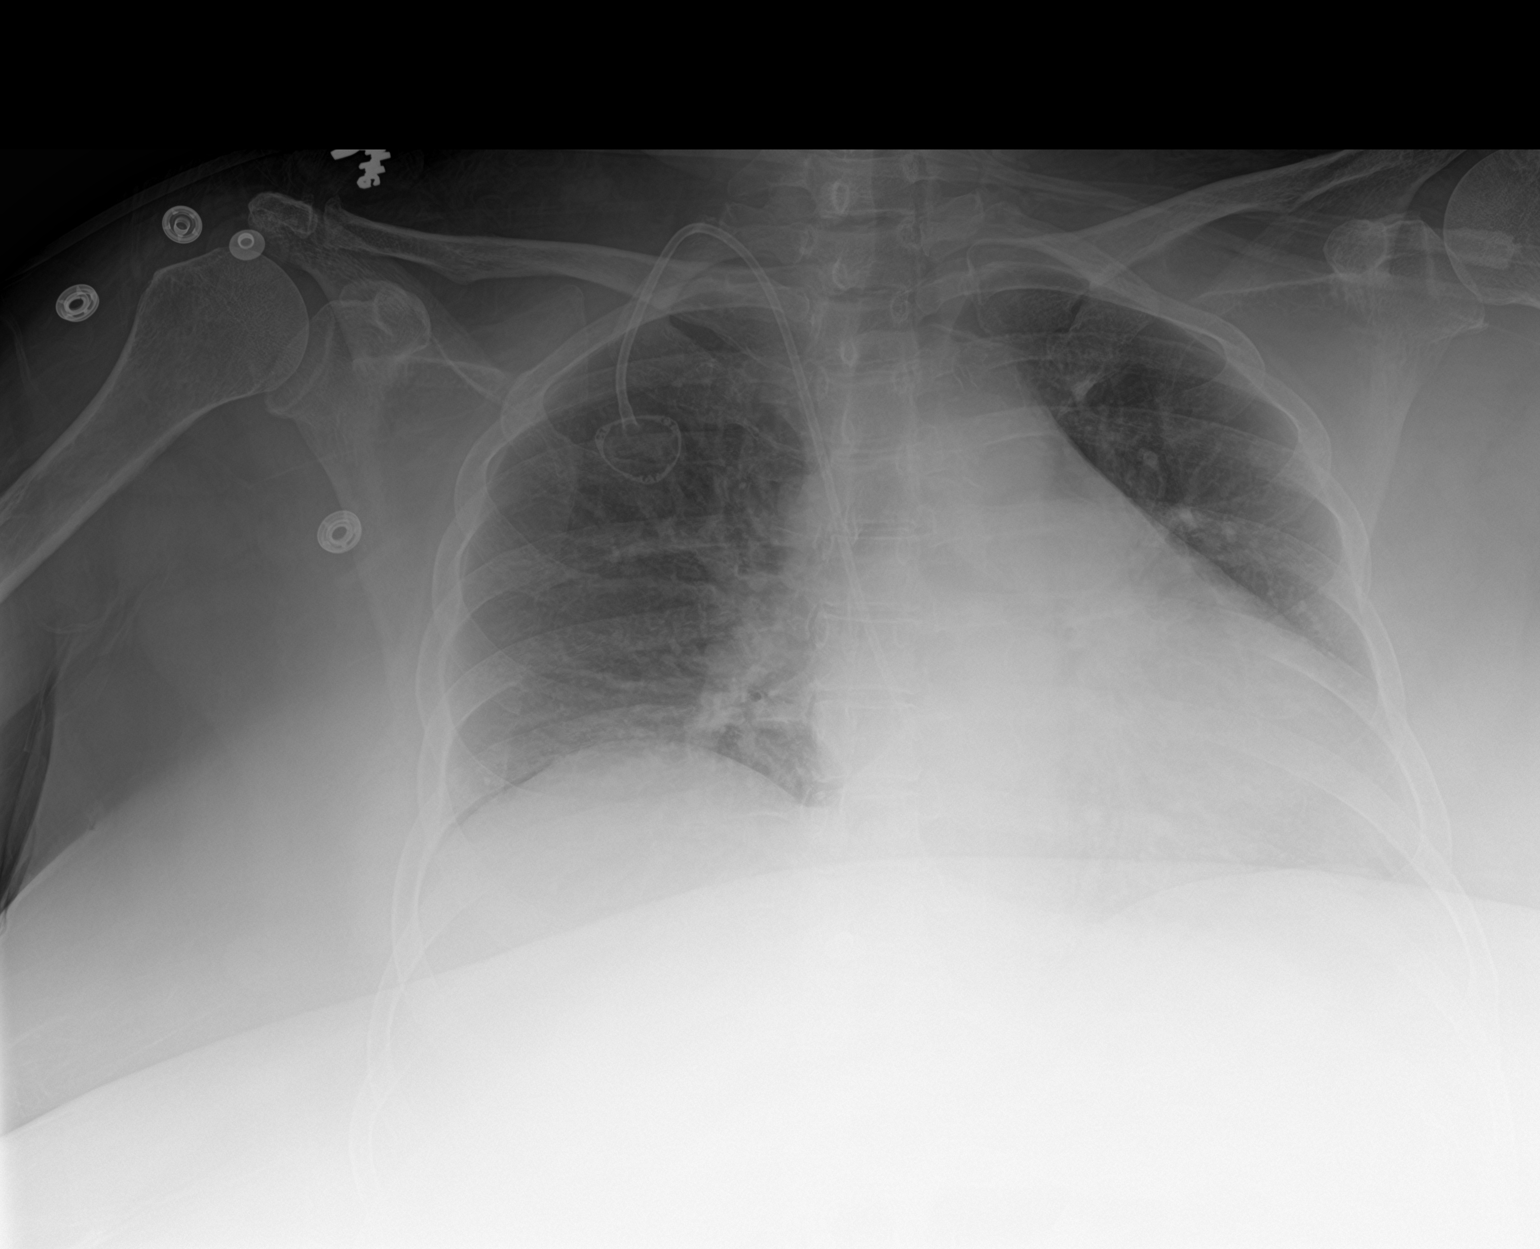

[1 of 1 positions shown; findings below may reference images not displayed]

FINDINGS: Right chest wall Port-A-Cath tip overlies the proximal right atrium.
There is shallow lung inflation and cardiomegaly. No pneumothorax or
sizable pleural effusion. No focal airspace consolidation or
pulmonary edema.
IMPRESSION: 1. Port-A-Cath tip overlying the proximal right atrium.
2. Cardiomegaly and shallow lung inflation without focal airspace
disease.

## 2018-12-10 DIAGNOSIS — M869 Osteomyelitis, unspecified: Secondary | ICD-10-CM | POA: Insufficient documentation

## 2019-06-10 DIAGNOSIS — Z09 Encounter for follow-up examination after completed treatment for conditions other than malignant neoplasm: Secondary | ICD-10-CM | POA: Diagnosis not present

## 2019-06-10 DIAGNOSIS — Z9221 Personal history of antineoplastic chemotherapy: Secondary | ICD-10-CM | POA: Diagnosis not present

## 2019-06-10 DIAGNOSIS — Z90710 Acquired absence of both cervix and uterus: Secondary | ICD-10-CM | POA: Diagnosis not present

## 2019-06-10 DIAGNOSIS — Z8541 Personal history of malignant neoplasm of cervix uteri: Secondary | ICD-10-CM | POA: Diagnosis not present

## 2019-06-10 DIAGNOSIS — Z923 Personal history of irradiation: Secondary | ICD-10-CM | POA: Diagnosis not present

## 2019-06-10 DIAGNOSIS — M869 Osteomyelitis, unspecified: Secondary | ICD-10-CM | POA: Diagnosis not present

## 2019-06-10 DIAGNOSIS — Z6841 Body Mass Index (BMI) 40.0 and over, adult: Secondary | ICD-10-CM | POA: Diagnosis not present

## 2019-06-10 DIAGNOSIS — M868X8 Other osteomyelitis, other site: Secondary | ICD-10-CM | POA: Diagnosis not present

## 2019-06-10 DIAGNOSIS — C541 Malignant neoplasm of endometrium: Secondary | ICD-10-CM | POA: Diagnosis not present

## 2019-06-10 DIAGNOSIS — C549 Malignant neoplasm of corpus uteri, unspecified: Secondary | ICD-10-CM | POA: Diagnosis not present

## 2019-06-10 DIAGNOSIS — N766 Ulceration of vulva: Secondary | ICD-10-CM | POA: Diagnosis not present

## 2019-09-27 DIAGNOSIS — N766 Ulceration of vulva: Secondary | ICD-10-CM | POA: Diagnosis not present

## 2019-09-27 DIAGNOSIS — Z09 Encounter for follow-up examination after completed treatment for conditions other than malignant neoplasm: Secondary | ICD-10-CM | POA: Diagnosis not present

## 2019-09-27 DIAGNOSIS — Z6841 Body Mass Index (BMI) 40.0 and over, adult: Secondary | ICD-10-CM | POA: Diagnosis not present

## 2019-09-27 DIAGNOSIS — C549 Malignant neoplasm of corpus uteri, unspecified: Secondary | ICD-10-CM | POA: Diagnosis not present

## 2019-10-17 DIAGNOSIS — C539 Malignant neoplasm of cervix uteri, unspecified: Secondary | ICD-10-CM | POA: Diagnosis not present

## 2019-10-17 DIAGNOSIS — M869 Osteomyelitis, unspecified: Secondary | ICD-10-CM | POA: Diagnosis not present

## 2019-10-17 DIAGNOSIS — C55 Malignant neoplasm of uterus, part unspecified: Secondary | ICD-10-CM | POA: Diagnosis not present

## 2019-10-17 DIAGNOSIS — Z09 Encounter for follow-up examination after completed treatment for conditions other than malignant neoplasm: Secondary | ICD-10-CM | POA: Diagnosis not present

## 2019-10-17 DIAGNOSIS — C549 Malignant neoplasm of corpus uteri, unspecified: Secondary | ICD-10-CM | POA: Diagnosis not present

## 2019-11-14 DIAGNOSIS — E782 Mixed hyperlipidemia: Secondary | ICD-10-CM | POA: Diagnosis not present

## 2019-11-14 DIAGNOSIS — E559 Vitamin D deficiency, unspecified: Secondary | ICD-10-CM | POA: Diagnosis not present

## 2019-11-14 DIAGNOSIS — Z23 Encounter for immunization: Secondary | ICD-10-CM | POA: Diagnosis not present

## 2019-11-14 DIAGNOSIS — R5383 Other fatigue: Secondary | ICD-10-CM | POA: Diagnosis not present

## 2019-12-20 DIAGNOSIS — Z923 Personal history of irradiation: Secondary | ICD-10-CM | POA: Diagnosis not present

## 2019-12-20 DIAGNOSIS — Z9221 Personal history of antineoplastic chemotherapy: Secondary | ICD-10-CM | POA: Diagnosis not present

## 2019-12-20 DIAGNOSIS — N939 Abnormal uterine and vaginal bleeding, unspecified: Secondary | ICD-10-CM | POA: Diagnosis not present

## 2019-12-20 DIAGNOSIS — Z09 Encounter for follow-up examination after completed treatment for conditions other than malignant neoplasm: Secondary | ICD-10-CM | POA: Diagnosis not present

## 2019-12-20 DIAGNOSIS — N3941 Urge incontinence: Secondary | ICD-10-CM | POA: Diagnosis not present

## 2019-12-20 DIAGNOSIS — N760 Acute vaginitis: Secondary | ICD-10-CM | POA: Diagnosis not present

## 2019-12-20 DIAGNOSIS — N766 Ulceration of vulva: Secondary | ICD-10-CM | POA: Diagnosis not present

## 2019-12-20 DIAGNOSIS — R319 Hematuria, unspecified: Secondary | ICD-10-CM | POA: Diagnosis not present

## 2019-12-20 DIAGNOSIS — M869 Osteomyelitis, unspecified: Secondary | ICD-10-CM | POA: Diagnosis not present

## 2019-12-20 DIAGNOSIS — C549 Malignant neoplasm of corpus uteri, unspecified: Secondary | ICD-10-CM | POA: Diagnosis not present

## 2019-12-20 DIAGNOSIS — Z6841 Body Mass Index (BMI) 40.0 and over, adult: Secondary | ICD-10-CM | POA: Diagnosis not present

## 2019-12-20 DIAGNOSIS — M726 Necrotizing fasciitis: Secondary | ICD-10-CM | POA: Diagnosis not present

## 2019-12-20 DIAGNOSIS — C539 Malignant neoplasm of cervix uteri, unspecified: Secondary | ICD-10-CM | POA: Diagnosis not present

## 2019-12-20 DIAGNOSIS — Z79891 Long term (current) use of opiate analgesic: Secondary | ICD-10-CM | POA: Diagnosis not present

## 2020-01-20 DIAGNOSIS — Z23 Encounter for immunization: Secondary | ICD-10-CM | POA: Diagnosis not present

## 2020-01-20 DIAGNOSIS — E559 Vitamin D deficiency, unspecified: Secondary | ICD-10-CM | POA: Diagnosis not present

## 2020-01-20 DIAGNOSIS — E782 Mixed hyperlipidemia: Secondary | ICD-10-CM | POA: Diagnosis not present

## 2020-03-05 DIAGNOSIS — R7309 Other abnormal glucose: Secondary | ICD-10-CM | POA: Diagnosis not present

## 2022-06-13 DIAGNOSIS — N895 Stricture and atresia of vagina: Secondary | ICD-10-CM | POA: Insufficient documentation

## 2022-06-13 DIAGNOSIS — N3941 Urge incontinence: Secondary | ICD-10-CM | POA: Insufficient documentation

## 2022-06-13 DIAGNOSIS — N898 Other specified noninflammatory disorders of vagina: Secondary | ICD-10-CM | POA: Insufficient documentation

## 2022-09-15 ENCOUNTER — Emergency Department (HOSPITAL_COMMUNITY)
Admission: EM | Admit: 2022-09-15 | Discharge: 2022-09-16 | Disposition: A | Discharge status: Home / Self Care | Payer: Medicare Other | Attending: Emergency Medicine | Admitting: Emergency Medicine

## 2022-09-15 ENCOUNTER — Other Ambulatory Visit: Payer: Self-pay

## 2022-09-15 ENCOUNTER — Encounter (HOSPITAL_COMMUNITY): Payer: Self-pay

## 2022-09-15 DIAGNOSIS — D72829 Elevated white blood cell count, unspecified: Secondary | ICD-10-CM | POA: Insufficient documentation

## 2022-09-15 DIAGNOSIS — Z8541 Personal history of malignant neoplasm of cervix uteri: Secondary | ICD-10-CM | POA: Diagnosis not present

## 2022-09-15 DIAGNOSIS — R000 Tachycardia, unspecified: Secondary | ICD-10-CM | POA: Diagnosis not present

## 2022-09-15 DIAGNOSIS — N3091 Cystitis, unspecified with hematuria: Secondary | ICD-10-CM | POA: Insufficient documentation

## 2022-09-15 DIAGNOSIS — R339 Retention of urine, unspecified: Secondary | ICD-10-CM | POA: Diagnosis present

## 2022-09-15 LAB — BASIC METABOLIC PANEL
Anion gap: 12 (ref 5–15)
BUN: 15 mg/dL (ref 8–23)
CO2: 21 mmol/L — ABNORMAL LOW (ref 22–32)
Calcium: 9.3 mg/dL (ref 8.9–10.3)
Chloride: 103 mmol/L (ref 98–111)
Creatinine, Ser: 0.76 mg/dL (ref 0.44–1.00)
GFR, Estimated: >60 mL/min (ref >60)
Glucose, Bld: 142 mg/dL — ABNORMAL HIGH (ref 70–99)
Potassium: 4.2 mmol/L (ref 3.5–5.1)
Sodium: 136 mmol/L (ref 135–145)

## 2022-09-15 LAB — URINALYSIS, ROUTINE W REFLEX MICROSCOPIC
Bilirubin Urine: NEGATIVE
Glucose, UA: NEGATIVE mg/dL
Ketones, ur: NEGATIVE mg/dL
Leukocytes,Ua: NEGATIVE
Nitrite: NEGATIVE
Protein, ur: 100 mg/dL — AB
RBC / HPF: >50 RBC/hpf (ref 0–5)
Specific Gravity, Urine: 1.02 (ref 1.005–1.030)
pH: 6 (ref 5.0–8.0)

## 2022-09-15 LAB — CBC
HCT: 48.5 % — ABNORMAL HIGH (ref 36.0–46.0)
Hemoglobin: 16.2 g/dL — ABNORMAL HIGH (ref 12.0–15.0)
MCH: 32.4 pg (ref 26.0–34.0)
MCHC: 33.4 g/dL (ref 30.0–36.0)
MCV: 97 fL (ref 80.0–100.0)
Platelets: 340 10*3/uL (ref 150–400)
RBC: 5 MIL/uL (ref 3.87–5.11)
RDW: 13.8 % (ref 11.5–15.5)
WBC: 10.6 10*3/uL — ABNORMAL HIGH (ref 4.0–10.5)
nRBC: 0 % (ref 0.0–0.2)

## 2022-09-15 NOTE — ED Triage Notes (Signed)
Patient BIB POV from home with complaint of urinary retention since 11am today.  Patient reports at 1000 had a episode of blood in pad & attempted to urinate & was unable too.   Patient denies fevers, reports bilateral lower lumbar pain also starting today.

## 2022-09-16 ENCOUNTER — Emergency Department (HOSPITAL_COMMUNITY): Payer: Medicare Other

## 2022-09-16 DIAGNOSIS — R339 Retention of urine, unspecified: Secondary | ICD-10-CM | POA: Diagnosis not present

## 2022-09-16 MED ORDER — IOHEXOL 350 MG/ML SOLN
100.0000 mL | Freq: Once | INTRAVENOUS | Status: AC | PRN
  Administered 2022-09-16: 100 mL via INTRAVENOUS

## 2022-09-16 MED ORDER — CEFDINIR 300 MG PO CAPS: 300.0000 mg | ORAL_CAPSULE | Freq: Two times a day (BID) | ORAL | 0 refills | Status: DC

## 2022-09-16 NOTE — ED Notes (Signed)
Delay in CT due to lack of access. Provider made aware. IV team consulted and currently at bedside.

## 2022-09-16 NOTE — Discharge Instructions (Addendum)
You were seen in the ER today for evaluation of your urinary retention.  You had a large blood clot in your urethra which I likely think was causing you to not be able to urinate.  I am glad that you are finding some relief after the catheter.  I would like for you to keep your appointment with your urologist/oncologist that you are seeing soon for reevaluation.  Unguinal prescribe you some antibiotics for you to take daily.  Please make sure you are completing the entirety of the course and take as prescribed.  Please make sure you are staying well-hydrated drinking plenty of fluids, mainly water.  If you have any decrease or lack of output into your catheter bag, please make sure to return to the ER immediately for reevaluation.  If you have any concerns, new or worsening symptoms, please return to the nearest emergency room for evaluation.  Contact a health care provider if you have: Chills or a fever. Blood in your urine. An urgent or frequent need to pass urine. Pain when passing urine. Get help right away if you: Have bright red blood or clots in your urine. Are unable to pass urine.

## 2022-09-16 NOTE — ED Notes (Signed)
IV team placed IV. Will call CT when charted.

## 2022-09-16 NOTE — ED Provider Notes (Signed)
[place] EMERGENCY DEPARTMENT AT [facility] Provider Note   CSN: [id number] Arrival date & time: 09/15/22  1854     History Chief Complaint  Patient presents with   Hematuria   Dysuria    Brandy Hardy is a 67 y.o. female with h/o cervical cancer, endometrial cancer sp hysterectomy, RT, and brachytherapy.  She is also received external beam radiation to the introitus and has documented history of vaginal anatomy abnormalities due to this.  She is presenting to the ER today for evaluation of acute urinary retention.  Patient reports that last time she voided was at 1000 this morning.  She reports that that was bloody as well.  Since then, she has had urgency few drops of blood.  She is currently pending of some lower abdominal pain and back pain after the need to feel if she needs to urinate.  She is currently on Gemtesa for OAB and urinary incontinence. She denies any fevers or chills.    Hematuria Associated symptoms include abdominal pain.  Dysuria Associated symptoms: abdominal pain   Associated symptoms: no fever, no nausea and no vomiting        Home Medications Prior to Admission medications   Medication Sig Start Date End Date Taking? Authorizing Provider  cefdinir (OMNICEF) 300 MG capsule Take 1 capsule (300 mg total) by mouth 2 (two) times daily. 09/16/22  Yes Achille Rich, PA-C  ALPRAZolam Prudy Feeler) 0.25 MG tablet Take 1 tablet (0.25 mg total) by mouth at bedtime as needed for anxiety. Patient not taking: Reported on 06/12/2017 05/30/16   Ranelle Oyster, MD  atorvastatin (LIPITOR) 40 MG tablet TAKE 1 TABLET DAILY Patient taking differently: TAKE 40 mg  TABLET DAILY 05/19/16   Reather Littler, MD  Lactobacillus-Inulin (CULTURELLE DIGESTIVE HEALTH) CAPS Take by mouth.    [provider]  Multiple Vitamin (MULTIVITAMIN WITH MINERALS) TABS tablet Take 1 tablet by mouth daily. 03/06/16   LoveEvlyn Kanner, PA-C  Naproxen Sodium (ALEVE) 220 MG CAPS Take 220 mg by  mouth as needed.    [provider]  oxyCODONE-acetaminophen (PERCOCET/ROXICET) 5-325 MG tablet Take 2 tablets by mouth every 4 (four) hours as needed for severe pain.    [provider]  PREMARIN vaginal cream Place 1 application vaginally as needed. 04/30/17   [provider]  Probiotic Product (PROBIOTIC DAILY PO) Take 1 tablet by mouth daily.     [provider]  Vitamin D, Ergocalciferol, (DRISDOL) 50000 units CAPS capsule TAKE 1 CAPSULE WEEKLY 05/19/16   Reather Littler, MD      Allergies    Patient has no known allergies.    Review of Systems   Review of Systems  Constitutional:  Negative for chills and fever.  Gastrointestinal:  Positive for abdominal pain. Negative for constipation, diarrhea, nausea and vomiting.  Genitourinary:  Positive for dysuria, hematuria and pelvic pain (chronic).  Musculoskeletal:  Positive for back pain.    Physical Exam Updated Vital Signs BP 139/85   Pulse 88   Temp 98.3 F (36.8 C)   Resp 18   Ht 5\' 2"  (1.575 m)   Wt 130.6 kg   SpO2 93%   BMI 52.68 kg/m  Physical Exam Vitals and nursing note reviewed.  Constitutional:      Appearance: She is obese. She is not ill-appearing or toxic-appearing.     Comments: Uncomfortable, tearful  Cardiovascular:     Rate and Rhythm: Tachycardia present.  Pulmonary:     Effort: Pulmonary effort  is normal. No respiratory distress.  Abdominal:     Palpations: Abdomen is soft.     Tenderness: There is abdominal tenderness. There is no guarding or rebound.  Genitourinary:    Comments: Abnormal vaginal anatomy, blood clot present in urethra. No significant swelling or abscess seen.  Musculoskeletal:     Comments: No tenderness to palpation of the back.  Skin:    General: Skin is warm and dry.  Neurological:     Mental Status: She is alert.     ED Results / Procedures / Treatments   Labs (all labs ordered are listed, but only abnormal results are displayed) Labs  Reviewed  URINALYSIS, ROUTINE W REFLEX MICROSCOPIC - Abnormal; Notable for the following components:      Result Value   Color, Urine AMBER (*)    APPearance CLOUDY (*)    Hgb urine dipstick MODERATE (*)    Protein, ur 100 (*)    Bacteria, UA RARE (*)    All other components within normal limits  CBC - Abnormal; Notable for the following components:   WBC 10.6 (*)    Hemoglobin 16.2 (*)    HCT 48.5 (*)    All other components within normal limits  BASIC METABOLIC PANEL - Abnormal; Notable for the following components:   CO2 21 (*)    Glucose, Bld 142 (*)    All other components within normal limits  URINE CULTURE    EKG None  Radiology CT ABDOMEN PELVIS W CONTRAST  Result Date: 09/16/2022 CLINICAL DATA:  Urinary retention and abdominal pain EXAM: CT ABDOMEN AND PELVIS WITH CONTRAST TECHNIQUE: Multidetector CT imaging of the abdomen and pelvis was performed using the standard protocol following bolus administration of intravenous contrast. RADIATION DOSE REDUCTION: This exam was performed according to the departmental dose-optimization program which includes automated exposure control, adjustment of the mA and/or kV according to patient size and/or use of iterative reconstruction technique. CONTRAST:  OMNIPAQUE IOHEXOL 350 MG/ML SOLN COMPARISON:  CT abdomen and pelvis 06/12/2017 and MR pelvis 06/18/2017 FINDINGS: Lower chest: No acute abnormality. Hepatobiliary: Unremarkable liver. Cholelithiasis without evidence of cholecystitis. No biliary dilation. Pancreas: Fatty atrophy of the pancreas without acute abnormality. Spleen: Unremarkable. Adrenals/Urinary Tract: Stable adrenal glands. No urinary calculi or hydronephrosis. Foley catheter in the nondistended bladder. Soft tissue thickening extends from the pubic symphysis to the bladder. Locule of gas within the bladder. Stomach/Bowel: Normal caliber large and small bowel. Colonic diverticulosis without diverticulitis. Normal appendix.  Stomach is within normal limits. Small hiatal hernia. Vascular/Lymphatic: No significant vascular findings are present. No enlarged abdominal or pelvic lymph nodes. Reproductive: Hysterectomy.  No adnexal mass. Other: No free intraperitoneal fluid or air. Musculoskeletal: No acute fracture. Extensive soft tissue inflammatory changes around the pubic symphysis are similar to 06/12/2017. Sclerosis and erosive change about the pubic symphysis and inferior pubic rami has progressed from 2019. Diastasis of the pubic symphysis is similar. Seton within a gas collection adjacent to the left inferior pubic ramus. Mild edema within the bilateral adductor musculature suggestive of myositis. IMPRESSION: 1. Extensive soft tissue inflammatory changes around the pubic symphysis and inferior pubic rami are similar to 06/12/2017. Sclerosis and erosive change about the pubic symphysis and inferior pubic rami compatible with osteomyelitis and progressed from 2019. 2. Soft tissue thickening extends from the pubic symphysis to the bladder. Locule of gas within the bladder. Vesicocutaneous fistula is not excluded. 3. Bilateral adductor muscle myositis. 4. Seton within a gas collection adjacent to the left inferior  pubic ramus. Electronically Signed   By: Minerva Fester M.D.   On: 09/16/2022 01:45    Procedures BLADDER CATHETERIZATION  Date/Time: 09/17/2022 8:06 PM  Performed by: Achille Rich, PA-C Authorized by: Achille Rich, PA-C   Consent:    Consent obtained:  Verbal   Consent given by:  Patient   Risks, benefits, and alternatives were discussed: yes     Risks discussed:  Incomplete procedure, infection and pain   Alternatives discussed:  No treatment Universal protocol:    Procedure explained and questions answered to patient or proxy's satisfaction: yes     Patient identity confirmed:  Verbally with patient Pre-procedure details:    Procedure purpose:  Therapeutic Anesthesia:    Anesthesia method:   None Procedure details:    Provider performed due to:  Altered anatomy and complicated insertion   Altered anatomy details: abnormal vaginal anatomy d/t cancer and radiation.   Catheter insertion:  Indwelling   Catheter type:  Foley   Bladder irrigation: no     Number of attempts:  2   Urine characteristics:  Bloody and dark Post-procedure details:    Procedure completion:  Tolerated well, no immediate complications Comments:     Nursing was unable to place the catheter, upon my inspection there was a large clot in the urethra and the catheter was not able to thread in. Sterile gloves donned. I was able to remove some of the clot with some longer cotton swabs. Afterwards, the catheter was able to be placed. She had dark, bloody urine return and relief of pain.     Medications Ordered in ED Medications  iohexol (OMNIPAQUE) 350 MG/ML injection 100 mL (100 mLs Intravenous Contrast Given 09/16/22 0116)    ED Course/ Medical Decision Making/ A&P                           Medical Decision Making Amount and/or Complexity of Data Reviewed Labs: ordered. Radiology: ordered.  Risk Prescription drug management.  67 y.o. female presents to the ER for evaluation of acute urinary retention with some initial hematuria. Differential diagnosis includes but is not limited to Cystitis, urinary calculi, renal cell carcinoma, bladder cancer, glomerulonephritis, polycystic kidneys, anticoagulant usage, interstitial nephritis. Vital signs hypertension and tachycardia, likely 2/2 pain. Improved when pain was controlled. Physical exam as noted above.   Given patient's prolonged time without voiding, I stayed in the room to assist with catheter placed. Due to body habitus and atypical vaginal anatomy, catheter insertion was difficult. I was able to place the catheter with dark, bloody urine output. She reports already improvement of pain.  Will order labs and CT imaging given the patient's history. She  reports that she has never had symptoms like this before.   On re-evaluation, the patient reports that her pain has subsided. Her urine is has also cleared and is only very slightly tinged with blood. She is no longer experiencing any back or abdominal pain. It was likely d/t spasm from retention.   I independently reviewed and interpreted the patient's labs.  BMP shows mildly decreased bicarb at 21, mildly elevated glucose at 142 otherwise no electrolyte abnormality.  CBC with mild leukocytosis at 10.6.  Hemoglobin hematocrit also elevated as well.  Could be some hemoconcentration from dehydration.  Urine culture pending given gross hematuria.  CT abdomen and pelvis shows  1. Extensive soft tissue inflammatory changes around the pubic symphysis and inferior pubic rami are similar to 06/12/2017. Sclerosis  and erosive change about the pubic symphysis and inferior pubic rami compatible with osteomyelitis and progressed from 2019. 2. Soft tissue thickening extends from the pubic symphysis to the bladder. Locule of gas within the bladder. Vesicocutaneous fistula is not excluded. 3. Bilateral adductor muscle myositis. 4. Seton within a gas collection adjacent to the left inferior pubic ramus.   I discussed the imaging and lab results with my attending.  She reviewed them as well independently.  She assessed the patient at bedside and reports that since she has a urology appointment tomorrow, she can follow-up in their clinic.  The CT imaging was do appear to be chronic.  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  Locule of gas seen within the bladder likely from the catheter.  The bilateral adductor muscle myositis is concerned however the patient does not have any more belly or back tenderness to palpation.  She reports that she is ambulatory without any pain.  Attending reports that she can follow-up with [place].  Seton with gas collection is typical  for presentation.  I do not think the patient is having any acute urinary retention from any cauda equina.  She has a obviously blood clot in the urethral meatus that was able to be removed and she was able to have urine output afterwards.  Her back pain and abdominal pain improved after draining of her bladder which is likely caused by bladder spasm.  Will leave catheter in given her urinary retention medication and hematuria causing clot to obstruct urethral meatus. The patient has follow up with UroGynOnc very soon. I discussed with the patient to continue the [facility] for antibiotic given this may be causing a UTI as well.  Discussed with her about leaving the catheter in.  Patient had a catheter before given her previous surgeries.  Catheter care information given to the discharge progress well.  We discussed the results of the labs/imaging. The plan is catheter care, take antibiotic as prescribed, follow-up with urologist. We discussed strict return precautions and red flag symptoms. The patient verbalized their understanding and agrees to the plan. The patient is stable and being discharged home in good condition.  Portions of this report may have been transcribed using voice recognition software. Every effort was made to ensure accuracy; however, inadvertent computerized transcription errors may be present.   I discussed this case with my attending physician who cosigned this note including patient's presenting symptoms, physical exam, and planned diagnostics and interventions. Attending physician stated agreement with plan or made changes to plan which were implemented.   Attending physician assessed patient at bedside.  Final Clinical Impression(s) / ED Diagnoses Final diagnoses:  Urinary retention  Hemorrhagic cystitis    Rx / DC Orders ED Discharge Orders          Ordered    cefdinir (OMNICEF) 300 MG capsule  2 times daily        09/16/22 0622              Achille Rich,  PA-C 09/17/22 2029    Tilden Fossa, MD 09/21/22 1122

## 2022-09-17 DIAGNOSIS — R310 Gross hematuria: Secondary | ICD-10-CM | POA: Insufficient documentation

## 2022-09-17 LAB — URINE CULTURE: Culture: NO GROWTH

## 2022-10-11 ENCOUNTER — Ambulatory Visit
Admission: RE | Admit: 2022-10-11 | Discharge: 2022-10-11 | Disposition: A | Discharge status: Home / Self Care | Payer: Medicare Other | Source: Ambulatory Visit | Attending: Internal Medicine | Admitting: Internal Medicine

## 2022-10-11 VITALS — BP 136/87 | HR 98 | Temp 98.7°F | Resp 20 | Ht 62.0 in | Wt 290.0 lb

## 2022-10-11 DIAGNOSIS — N3001 Acute cystitis with hematuria: Secondary | ICD-10-CM | POA: Insufficient documentation

## 2022-10-11 DIAGNOSIS — N390 Urinary tract infection, site not specified: Secondary | ICD-10-CM | POA: Insufficient documentation

## 2022-10-11 LAB — POCT URINALYSIS DIP (MANUAL ENTRY)
Bilirubin, UA: NEGATIVE
Glucose, UA: 100 mg/dL — AB
Nitrite, UA: POSITIVE — AB
Protein Ur, POC: 100 mg/dL — AB
Spec Grav, UA: 1.01 (ref 1.010–1.025)
Urobilinogen, UA: 1 E.U./dL
pH, UA: 6 (ref 5.0–8.0)

## 2022-10-11 MED ORDER — CIPROFLOXACIN HCL 500 MG PO TABS: 500.0000 mg | ORAL_TABLET | Freq: Two times a day (BID) | ORAL | 0 refills | Status: AC

## 2022-10-11 NOTE — Discharge Instructions (Signed)
Please continue taking Azo as needed for urinary or bladder pain Please take antibiotics as prescribed Will call you with recommendations if labs are abnormal Please increase oral fluid intake You may take Tylenol as needed for pain If you have worsening symptoms or any other concerns please feel free to return to the urgent care to be reevaluated.

## 2022-10-11 NOTE — ED Triage Notes (Signed)
Referred to UC by PCP/Specialty:  Note (Copied from Nurse with provider office during phone call on 10-10-2022 @ 1415):  This patient had a cystoscopy with Dr. Nadara Mustard on 7/1.  She reported having urinary frequency (every hr) and burning. She took AZO x 4days with no improvement.  She reported cramping like sensation and urge to go to the bathroom every time she lays down.  She denies fevers, chills, and no abd/low back pain.   Per PCP:  Hi, her irritation could be from the procedure and she has sensitive bladder and vaginal tissue from her prior radiation. Since it is Friday afternoon I'm not sure if she can come in today so we can dip her urine, or if she can go to a local urgent care to leave a urine sample to be checked for a UTI. I'd do one of the two.    Needs checked for UTI (per patient).

## 2022-10-11 NOTE — ED Provider Notes (Signed)
[facility]    CSN: [id number] Arrival date & time: 10/11/22  1142      History   Chief Complaint Chief Complaint  Patient presents with   Urinary Frequency    Need testing for possible UTI - Entered by patient    HPI Brandy Hardy is a 67 y.o. female comes to the urgent care with complaints of pain on urination, increased urinary frequency and urgency of urination.  Patient underwent a cystoscopy on July 1 for hematuria.  After cystoscopy patient developed worsening pain on urination, increased frequency of urination and urgency with urination.  She denies abdominal pain.  No flank pain.  No nausea or vomiting.  No fever or chills.  Patient was advised by the urologist to come to the urgent care to be evaluated further.  Patient denies any blood in urine at this time.  She has been taking Azo with no improvement in her symptoms.   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   Renal disorder     Patient Active Problem List   Diagnosis Date Noted   Gross hematuria 09/17/2022   Urge urinary incontinence 06/13/2022   Vaginal discharge 06/13/2022   Vaginal stenosis 06/13/2022   Osteomyelitis (HCC) 12/10/2018   Acute osteomyelitis of pelvic region, right (HCC) 07/13/2017   Vulvar cancer (HCC) 06/13/2017   Cellulitis of right groin    Difficult or painful urination 03/05/2016   Acute lower UTI    Debility 02/22/2016   Abscess of right groin 02/20/2016   Abscess of groin 02/20/2016   Fever    History of cervical cancer    Acute blood loss anemia    Anemia of chronic disease    Tachypnea    Lymphocytosis    Abscess of groin, right    Super obese    Sepsis (HCC)    Septic shock (HCC) 02/14/2016   UTI (urinary tract infection)    Vulvar ulcer 06/04/2015    Chemotherapy follow-up examination 07/10/2014   Obesity, morbid (HCC) 02/21/2013   T2DM (type 2 diabetes mellitus) (HCC) 11/23/2012   Hyperlipidemia 11/23/2012   Hypertension 09/16/2012   Diabetes mellitus (HCC) 09/16/2012   Malignant neoplasm of corpus uteri (HCC) 03/24/2010   Morbid obesity (HCC) 03/24/2010    Past Surgical History:  Procedure Laterality Date   ABDOMINAL HYSTERECTOMY     I & D EXTREMITY Right 02/20/2016   Procedure: IRRIGATION AND DEBRIDEMENT EXTREMITY RIGHT UPPER THIGH;  Surgeon: Claud Kelp, MD;  Location: [facility] OR;  Service: General;  Laterality: Right;   INCISION AND DRAINAGE PERIRECTAL ABSCESS N/A 06/13/2017   Procedure: IRRIGATION AND DEBRIDEMENT OF MONS PUBIS ABSCESS;  Surgeon: Berna Bue, MD;  Location: [facility] OR;  Service: General;  Laterality: N/A;   IRRIGATION AND DEBRIDEMENT ABSCESS Right 02/16/2016   Procedure: IRRIGATION AND DEBRIDEMENT RIGHT GROIN ABSCESS, FULL THICKNESS DEBRIDMENT RIGHT GROIN INCLUDING > 40CM SQUARE.;  Surgeon: Rodman Pickle, MD;  Location: [facility] OR;  Service: General;  Laterality: Right;   IRRIGATION AND DEBRIDEMENT ABSCESS Right 02/20/2016   Procedure: IRRIGATION AND DEBRIDEMENT ABSCESS;  Surgeon: Claud Kelp, MD;  Location: [facility] OR;  Service: General;  Laterality: Right;  Perii-Vaginal Abscess    OB History   No obstetric history on  file.      Home Medications    Prior to Admission medications   Medication Sig Start Date End Date Taking? Authorizing Provider  atorvastatin (LIPITOR) 40 MG tablet TAKE 1 TABLET DAILY 05/19/16  Yes Reather Littler, MD  Cholecalciferol 1.25 MG (50000 UT) capsule Take 50,000 Units by mouth once a week.   Yes [provider]  ciprofloxacin (CIPRO) 500 MG tablet Take 1 tablet (500 mg total) by mouth every 12 (twelve) hours for 5 days. 10/11/22 10/16/22 Yes Donisha Hoch, Britta Mccreedy, MD  estradiol (ESTRACE) 0.1 MG/GM vaginal cream Place 1 Applicatorful vaginally as directed. 07/03/22 09/22/23 Yes  [provider]  GEMTESA 75 MG TABS Take 1 tablet by mouth every morning. 07/24/22  Yes [provider]  Lactobacillus-Inulin (CULTURELLE DIGESTIVE HEALTH) CAPS Take by mouth.   Yes [provider]  Multiple Vitamin (MULTIVITAMIN WITH MINERALS) TABS tablet Take 1 tablet by mouth daily. 03/06/16  Yes Love, Evlyn Kanner, PA-C  Naproxen Sodium (ALEVE) 220 MG CAPS Take 220 mg by mouth as needed.   Yes [provider]  phenazopyridine (PYRIDIUM) 95 MG tablet Take 95 mg by mouth 3 (three) times daily as needed for pain.   Yes [provider]  ALPRAZolam (XANAX) 0.25 MG tablet Take 1 tablet (0.25 mg total) by mouth at bedtime as needed for anxiety. Patient not taking: Reported on 06/12/2017 05/30/16   Ranelle Oyster, MD  PREMARIN vaginal cream Place 1 application vaginally as needed. 04/30/17   [provider]  Probiotic Product (PROBIOTIC DAILY PO) Take 1 tablet by mouth daily.     [provider]  Vitamin D, Ergocalciferol, (DRISDOL) 50000 units CAPS capsule TAKE 1 CAPSULE WEEKLY 05/19/16   Reather Littler, MD    Family History Family History  Problem Relation Age of Onset   Hypertension Mother    Heart disease Father 45       Expired   Diabetes Neg Hx     Social History Social History   Tobacco Use   Smoking status: Never   Smokeless tobacco: Never  Vaping Use   Vaping Use: Never used  Substance Use Topics   Alcohol use: No   Drug use: No     Allergies   Patient has no known allergies.   Review of Systems Review of Systems As per HPI  Physical Exam Triage Vital Signs ED Triage Vitals  Enc Vitals Group     BP 10/11/22 1210 136/87     Pulse Rate 10/11/22 1210 98     Resp 10/11/22 1210 20     Temp 10/11/22 1210 98.7 F (37.1 C)     Temp Source 10/11/22 1210 Oral     SpO2 10/11/22 1210 94 %     Weight 10/11/22 1207 290 lb (131.5 kg)     Height 10/11/22 1207 5\' 2"  (1.575 m)     Head Circumference --      Peak Flow --       Pain Score 10/11/22 1206 3     Pain Loc --      Pain Edu? --      Excl. in GC? --    No data found.  Updated Vital Signs BP 136/87 (BP Location: Left Arm)   Pulse 98   Temp 98.7 F (37.1 C) (Oral)   Resp 20   Ht 5\' 2"  (1.575 m)   Wt 131.5 kg   SpO2 94%   BMI 53.04 kg/m   Visual Acuity Right Eye Distance:  Left Eye Distance:   Bilateral Distance:    Right Eye Near:   Left Eye Near:    Bilateral Near:     Physical Exam Vitals and nursing note reviewed.  Constitutional:      General: She is not in acute distress.    Appearance: She is obese. She is not ill-appearing.  Cardiovascular:     Rate and Rhythm: Normal rate and regular rhythm.     Pulses: Normal pulses.     Heart sounds: Normal heart sounds.  Pulmonary:     Effort: Pulmonary effort is normal.     Breath sounds: Normal breath sounds.  Abdominal:     General: Bowel sounds are normal.     Palpations: Abdomen is soft.      UC Treatments / Results  Labs (all labs ordered are listed, but only abnormal results are displayed) Labs Reviewed  POCT URINALYSIS DIP (MANUAL ENTRY) - Abnormal; Notable for the following components:      Result Value   Color, UA orange (*)    Clarity, UA cloudy (*)    Glucose, UA =100 (*)    Ketones, POC UA trace (5) (*)    Blood, UA moderate (*)    Protein Ur, POC =100 (*)    Nitrite, UA Positive (*)    Leukocytes, UA Large (3+) (*)    All other components within normal limits  URINE CULTURE    EKG   Radiology No results found.  Procedures Procedures (including critical care time)  Medications Ordered in UC Medications - No data to display  Initial Impression / Assessment and Plan / UC Course  I have reviewed the triage vital signs and the nursing notes.  Pertinent labs & imaging results that were available during my care of the patient were reviewed by me and considered in my medical decision making (see chart for details).     1.  Complicated urinary  tract infection: Point-of-care urinalysis is positive for hemoglobin, nitrite and leukocyte esterase (patient is currently taking Azo) Urine cultures have been sent Ciprofloxacin 500 mg twice daily for 7 days Will call patient with culture results Patient is advised to take ibuprofen or Tylenol as needed for pain Increase fluid intake Return precautions given. Final Clinical Impressions(s) / UC Diagnoses   Final diagnoses:  Acute cystitis with hematuria  Complicated UTI (urinary tract infection)     Discharge Instructions      Please continue taking Azo as needed for urinary or bladder pain Please take antibiotics as prescribed Will call you with recommendations if labs are abnormal Please increase oral fluid intake You may take Tylenol as needed for pain If you have worsening symptoms or any other concerns please feel free to return to the urgent care to be reevaluated.     ED Prescriptions     Medication Sig Dispense Auth. Provider   ciprofloxacin (CIPRO) 500 MG tablet Take 1 tablet (500 mg total) by mouth every 12 (twelve) hours for 5 days. 10 tablet Charisma Charlot, Britta Mccreedy, MD      PDMP not reviewed this encounter.   Merrilee Jansky, MD 10/11/22 1321

## 2022-10-12 LAB — URINE CULTURE

## 2022-12-01 ENCOUNTER — Other Ambulatory Visit: Payer: Self-pay | Admitting: Family Medicine

## 2022-12-01 DIAGNOSIS — Z0000 Encounter for general adult medical examination without abnormal findings: Secondary | ICD-10-CM

## 2022-12-01 DIAGNOSIS — Z13820 Encounter for screening for osteoporosis: Secondary | ICD-10-CM

## 2022-12-17 ENCOUNTER — Ambulatory Visit: Payer: Medicare Other

## 2023-01-22 ENCOUNTER — Inpatient Hospital Stay (HOSPITAL_BASED_OUTPATIENT_CLINIC_OR_DEPARTMENT_OTHER)
Admission: EM | Admit: 2023-01-22 | Discharge: 2023-01-25 | DRG: 690 | Disposition: A | Discharge status: Home / Self Care | Payer: Medicare Other | Attending: Internal Medicine | Admitting: Internal Medicine

## 2023-01-22 ENCOUNTER — Other Ambulatory Visit: Payer: Self-pay

## 2023-01-22 ENCOUNTER — Encounter (HOSPITAL_BASED_OUTPATIENT_CLINIC_OR_DEPARTMENT_OTHER): Payer: Self-pay

## 2023-01-22 DIAGNOSIS — R339 Retention of urine, unspecified: Secondary | ICD-10-CM | POA: Diagnosis present

## 2023-01-22 DIAGNOSIS — Z6841 Body Mass Index (BMI) 40.0 and over, adult: Secondary | ICD-10-CM | POA: Diagnosis not present

## 2023-01-22 DIAGNOSIS — Z8249 Family history of ischemic heart disease and other diseases of the circulatory system: Secondary | ICD-10-CM | POA: Diagnosis not present

## 2023-01-22 DIAGNOSIS — N760 Acute vaginitis: Secondary | ICD-10-CM | POA: Diagnosis present

## 2023-01-22 DIAGNOSIS — D75839 Thrombocytosis, unspecified: Secondary | ICD-10-CM | POA: Diagnosis present

## 2023-01-22 DIAGNOSIS — R32 Unspecified urinary incontinence: Secondary | ICD-10-CM | POA: Diagnosis present

## 2023-01-22 DIAGNOSIS — Z8541 Personal history of malignant neoplasm of cervix uteri: Secondary | ICD-10-CM | POA: Diagnosis not present

## 2023-01-22 DIAGNOSIS — N390 Urinary tract infection, site not specified: Secondary | ICD-10-CM | POA: Diagnosis present

## 2023-01-22 DIAGNOSIS — Z90710 Acquired absence of both cervix and uterus: Secondary | ICD-10-CM

## 2023-01-22 DIAGNOSIS — E785 Hyperlipidemia, unspecified: Secondary | ICD-10-CM | POA: Diagnosis present

## 2023-01-22 DIAGNOSIS — N179 Acute kidney failure, unspecified: Secondary | ICD-10-CM | POA: Diagnosis not present

## 2023-01-22 DIAGNOSIS — N3289 Other specified disorders of bladder: Secondary | ICD-10-CM | POA: Diagnosis present

## 2023-01-22 DIAGNOSIS — Z79899 Other long term (current) drug therapy: Secondary | ICD-10-CM

## 2023-01-22 DIAGNOSIS — Z8542 Personal history of malignant neoplasm of other parts of uterus: Secondary | ICD-10-CM | POA: Diagnosis not present

## 2023-01-22 DIAGNOSIS — C519 Malignant neoplasm of vulva, unspecified: Secondary | ICD-10-CM | POA: Diagnosis present

## 2023-01-22 DIAGNOSIS — Z9221 Personal history of antineoplastic chemotherapy: Secondary | ICD-10-CM

## 2023-01-22 DIAGNOSIS — N029 Recurrent and persistent hematuria with unspecified morphologic changes: Secondary | ICD-10-CM | POA: Diagnosis present

## 2023-01-22 DIAGNOSIS — Z923 Personal history of irradiation: Secondary | ICD-10-CM

## 2023-01-22 DIAGNOSIS — I10 Essential (primary) hypertension: Secondary | ICD-10-CM | POA: Diagnosis present

## 2023-01-22 DIAGNOSIS — N3281 Overactive bladder: Secondary | ICD-10-CM | POA: Diagnosis present

## 2023-01-22 DIAGNOSIS — E119 Type 2 diabetes mellitus without complications: Secondary | ICD-10-CM

## 2023-01-22 LAB — CBC WITH DIFFERENTIAL/PLATELET
Abs Immature Granulocytes: 0.06 10*3/uL (ref 0.00–0.07)
Basophils Absolute: 0.1 10*3/uL (ref 0.0–0.1)
Basophils Relative: 1 %
Eosinophils Absolute: 0.2 10*3/uL (ref 0.0–0.5)
Eosinophils Relative: 2 %
HCT: 41.8 % (ref 36.0–46.0)
Hemoglobin: 13.9 g/dL (ref 12.0–15.0)
Immature Granulocytes: 1 %
Lymphocytes Relative: 13 %
Lymphs Abs: 1.5 10*3/uL (ref 0.7–4.0)
MCH: 31.6 pg (ref 26.0–34.0)
MCHC: 33.3 g/dL (ref 30.0–36.0)
MCV: 95 fL (ref 80.0–100.0)
Monocytes Absolute: 0.8 10*3/uL (ref 0.1–1.0)
Monocytes Relative: 7 %
Neutro Abs: 8.5 10*3/uL — ABNORMAL HIGH (ref 1.7–7.7)
Neutrophils Relative %: 76 %
Platelets: 445 10*3/uL — ABNORMAL HIGH (ref 150–400)
RBC: 4.4 MIL/uL (ref 3.87–5.11)
RDW: 13.5 % (ref 11.5–15.5)
WBC: 11 10*3/uL — ABNORMAL HIGH (ref 4.0–10.5)
nRBC: 0 % (ref 0.0–0.2)

## 2023-01-22 LAB — WET PREP, GENITAL
Sperm: NONE SEEN
Trich, Wet Prep: NONE SEEN
WBC, Wet Prep HPF POC: <10 (ref <10)
Yeast Wet Prep HPF POC: NONE SEEN

## 2023-01-22 LAB — BASIC METABOLIC PANEL
Anion gap: 9 (ref 5–15)
BUN: 16 mg/dL (ref 8–23)
CO2: 30 mmol/L (ref 22–32)
Calcium: 9.7 mg/dL (ref 8.9–10.3)
Chloride: 100 mmol/L (ref 98–111)
Creatinine, Ser: 0.58 mg/dL (ref 0.44–1.00)
GFR, Estimated: >60 mL/min (ref >60)
Glucose, Bld: 102 mg/dL — ABNORMAL HIGH (ref 70–99)
Potassium: 3.9 mmol/L (ref 3.5–5.1)
Sodium: 139 mmol/L (ref 135–145)

## 2023-01-22 MED ORDER — LIDOCAINE HCL URETHRAL/MUCOSAL 2 % EX GEL
1.0000 | Freq: Once | CUTANEOUS | Status: DC
  Filled 2023-01-22: qty 11

## 2023-01-22 MED ORDER — SENNOSIDES-DOCUSATE SODIUM 8.6-50 MG PO TABS: 1.0000 | ORAL_TABLET | Freq: Every evening | ORAL | Status: DC | PRN

## 2023-01-22 MED ORDER — ACETAMINOPHEN 650 MG RE SUPP: 650.0000 mg | Freq: Four times a day (QID) | RECTAL | Status: DC | PRN

## 2023-01-22 MED ORDER — HYDROMORPHONE HCL 1 MG/ML IJ SOLN
0.5000 mg | INTRAMUSCULAR | Status: DC | PRN
  Administered 2023-01-23 (×2): 0.5 mg via INTRAVENOUS
  Filled 2023-01-22: qty 0.5
  Filled 2023-01-22: qty 1
  Filled 2023-01-22: qty 0.5

## 2023-01-22 MED ORDER — KCL IN DEXTROSE-NACL 20-5-0.45 MEQ/L-%-% IV SOLN: INTRAVENOUS | Status: DC

## 2023-01-22 MED ORDER — HEPARIN SODIUM (PORCINE) 5000 UNIT/ML IJ SOLN: 5000.0000 [IU] | Freq: Three times a day (TID) | INTRAMUSCULAR | Status: DC

## 2023-01-22 MED ORDER — ACETAMINOPHEN 325 MG PO TABS
650.0000 mg | ORAL_TABLET | Freq: Four times a day (QID) | ORAL | Status: DC | PRN
  Administered 2023-01-23: 650 mg via ORAL
  Filled 2023-01-22: qty 2

## 2023-01-22 MED ORDER — ONDANSETRON HCL 4 MG PO TABS: 4.0000 mg | ORAL_TABLET | Freq: Four times a day (QID) | ORAL | Status: DC | PRN

## 2023-01-22 MED ORDER — SODIUM CHLORIDE 0.9 % IV SOLN
2.0000 g | Freq: Once | INTRAVENOUS | Status: AC
  Administered 2023-01-22: 2 g via INTRAVENOUS
  Filled 2023-01-22: qty 20

## 2023-01-22 MED ORDER — ONDANSETRON HCL 4 MG/2ML IJ SOLN: 4.0000 mg | Freq: Four times a day (QID) | INTRAMUSCULAR | Status: DC | PRN

## 2023-01-22 NOTE — H&P (Signed)
PCP:   Irven Coe, MD   Chief Complaint:  Decreased urine output.  HPI: This is a pleasant 67 year old female with past medical history significant for extreme morbid obesity, stage II cervical adenocarcinoma, recurrent endometrial cancer, sp chemoradiation in 2016.  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 Per patient since June she has been having problem with incontinence.  On June 10 she was at [facility] where a cath was placed.  She had hemorrhagic cystitis with clots leading to obstruction.  She had a urology follow-up, her Foley was removed, initially all was okay.  On July 1 she had a cystoscopy which showed urethrovesical junction oozing. No malignancy or metaplasia.  As she had urinary incontinence and pelvic pain from her radiation as well as her incontinence she was tried on both Gemtesa and Solifenacin without success.  Per patient she had recurrence of bleeding and decreased urine output.  She has since discontinued both.  On Tuesday she noted decrease in urine output per patient more for trickle.  She has significant bladder spasms.  She is not uncomfortable but not spasming but has very little urine output.  Per patient approximately 3 weeks ago unsuccessful Foley placement was noted.  She came to the ER due to decreased urine output and ongoing bladder spasms.  Urologist on-call Dr. Kirtland Bouchard contacted by EDP patient states he will see patient in AM.    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   Renal disorder    Past Surgical History:  Procedure Laterality Date   ABDOMINAL HYSTERECTOMY      I & D EXTREMITY Right 02/20/2016   Procedure: IRRIGATION AND DEBRIDEMENT EXTREMITY RIGHT UPPER THIGH;  Surgeon: Claud Kelp, MD;  Location: [facility];  Service: General;  Laterality: Right;   INCISION AND DRAINAGE PERIRECTAL ABSCESS N/A 06/13/2017   Procedure: IRRIGATION AND DEBRIDEMENT OF MONS PUBIS ABSCESS;  Surgeon: Berna Bue, MD;  Location: [facility];  Service: General;  Laterality: N/A;   IRRIGATION AND DEBRIDEMENT ABSCESS Right 02/16/2016   Procedure: IRRIGATION AND DEBRIDEMENT RIGHT GROIN ABSCESS, FULL THICKNESS DEBRIDMENT RIGHT GROIN INCLUDING > 40CM SQUARE.;  Surgeon: Rodman Pickle, MD;  Location: [facility];  Service: General;  Laterality: Right;   IRRIGATION AND DEBRIDEMENT ABSCESS Right 02/20/2016   Procedure: IRRIGATION AND DEBRIDEMENT ABSCESS;  Surgeon: Claud Kelp, MD;  Location: [facility];  Service: General;  Laterality: Right;  Perii-Vaginal Abscess    Medications: Prior to Admission medications   Medication Sig Start Date End Date Taking? Authorizing Provider  amitriptyline (ELAVIL) 10 MG tablet Take 10 mg by mouth at bedtime.    [provider]  atorvastatin (LIPITOR) 40 MG tablet TAKE 1 TABLET DAILY 05/19/16   Reather Littler, MD  estradiol (ESTRACE) 0.1 MG/GM vaginal cream Place 1 Applicatorful vaginally as directed. 07/03/22 09/22/23  [provider]  GEMTESA 75 MG TABS Take 1 tablet by mouth every morning. 07/24/22   [provider]  Lactobacillus-Inulin (CULTURELLE DIGESTIVE HEALTH) CAPS Take by mouth.    [provider]  Multiple Vitamin (MULTIVITAMIN WITH MINERALS) TABS tablet Take 1 tablet  by mouth daily. 03/06/16   Love, Evlyn Kanner, PA-C  phenazopyridine (PYRIDIUM) 95 MG tablet Take 95 mg by mouth 3 (three) times daily as needed for pain.    [provider]  PREMARIN vaginal cream Place 1 application vaginally as needed. 04/30/17   [provider]  Probiotic Product (PROBIOTIC DAILY PO) Take 1 tablet by mouth daily.      [provider]  solifenacin (VESICARE) 5 MG tablet Take 1 tablet by mouth daily. 01/05/23 01/05/24  [provider]  Vitamin D, Ergocalciferol, (DRISDOL) 50000 units CAPS capsule TAKE 1 CAPSULE WEEKLY Patient taking differently: 50,000 Units See admin instructions. Twice weekly 05/19/16   Reather Littler, MD    Allergies:  No Known Allergies  Social History:  reports that she has never smoked. She has never used smokeless tobacco. She reports that she does not drink alcohol and does not use drugs.  Family History: Family History  Problem Relation Age of Onset   Hypertension Mother    Heart disease Father 73       Expired   Diabetes Neg Hx     Physical Exam: Vitals:   01/22/23 1730 01/22/23 1830 01/22/23 1930 01/22/23 2037  BP: (!) 145/59 126/89 131/69 (!) 154/70  Pulse: 97 96 99 (!) 105  Resp: 16 16 16 20   Temp:    98 F (36.7 C)  TempSrc:    Oral  SpO2: 95% 98% 93% 95%  Weight:      Height:        General: A and O x 3, morbidly obese female, currently in no acute distress Eyes: Pink conjunctiva, no scleral icterus ENT: Moist oral mucosa, neck supple, no thyromegaly Lungs: clear to ascultation, no wheeze, no crackles, no use of accessory muscles Cardiovascular: RRR, no regurgitation, no gallops, no murmurs. No carotid bruits, no JVD Abdomen: soft, positive BS, NTND, no organomegaly, not an acute abdomen GU: not examined Neuro: CN II - XII grossly intact, sensation intact Musculoskeletal: strength 5/5 all extremities, no clubbing, cyanosis or edema Skin: no rash, no subcutaneous crepitation, no decubitus Psych: appropriate patient   Labs on Admission:  Recent Labs    01/22/23 1558  NA 139  K 3.9  CL 100  CO2 30  GLUCOSE 102*  BUN 16  CREATININE 0.58  CALCIUM 9.7    Recent Labs    01/22/23 1558  WBC 11.0*  NEUTROABS 8.5*  HGB 13.9  HCT 41.8  MCV 95.0  PLT 445*    Micro Results: Recent Results (from the past 240 hour(s))  Wet prep,  genital     Status: Abnormal   Collection Time: 01/22/23  5:35 PM  Result Value Ref Range Status   Yeast Wet Prep HPF POC NONE SEEN NONE SEEN Final   Trich, Wet Prep NONE SEEN NONE SEEN Final   Clue Cells Wet Prep HPF POC PRESENT (A) NONE SEEN Final   WBC, Wet Prep HPF POC <10 <10 Final    Comment: Specimen diluted due to transport tube containing more than 1 ml of saline, interpret results with caution.   Sperm NONE SEEN  Final    Comment: Performed at [facility], [address], [place], [place] [address]     Radiological Exams on Admission: No results found.  Assessment/Plan Present on Admission:  Urinary retention -Patient seen by urologist in ER.  Unsuccessful attempted Foley placement.  Recommends n.p.o., suprapubic catheter placement in AM.   Bacterial vaginosis -++ Clue cells on wet prep.  Flagyl ordered   History of stage II cervical adenocarcinoma, recurrent endometrial cancer, -In remission  Brandy Hardy 01/22/2023, 11:16 PM

## 2023-01-22 NOTE — ED Provider Notes (Signed)
[place] EMERGENCY DEPARTMENT AT [facility] Provider Note   CSN: [id number] Arrival date & time: 01/22/23  1441     History  Chief Complaint  Patient presents with   Urinary Retention    Brandy Hardy is a 67 y.o. female.  With a history of cervical cancer and obstructive uropathy who presents to the ED for urinary retention.  Patient reports inability to void to completion over the last 2 days.  She has strained to pass urine and states only small amounts are "trickling" out.  This is similar to prior episodes of obstruction related to clotting requiring Foley catheter placement.  She is followed by a [facility] urologist.  No fevers, chills, abdominal pain or flank pain.  No current chemotherapy or radiation.  HPI     Home Medications Prior to Admission medications   Medication Sig Start Date End Date Taking? Authorizing Provider  ALPRAZolam (XANAX) 0.25 MG tablet Take 1 tablet (0.25 mg total) by mouth at bedtime as needed for anxiety. Patient not taking: Reported on 06/12/2017 05/30/16   Ranelle Oyster, MD  atorvastatin (LIPITOR) 40 MG tablet TAKE 1 TABLET DAILY 05/19/16   Reather Littler, MD  Cholecalciferol 1.25 MG (50000 UT) capsule Take 50,000 Units by mouth once a week.    [provider]  estradiol (ESTRACE) 0.1 MG/GM vaginal cream Place 1 Applicatorful vaginally as directed. 07/03/22 09/22/23  [provider]  GEMTESA 75 MG TABS Take 1 tablet by mouth every morning. 07/24/22   [provider]  Lactobacillus-Inulin (CULTURELLE DIGESTIVE HEALTH) CAPS Take by mouth.    [provider]  Multiple Vitamin (MULTIVITAMIN WITH MINERALS) TABS tablet Take 1 tablet by mouth daily. 03/06/16   LoveEvlyn Kanner, PA-C  Naproxen Sodium (ALEVE) 220 MG CAPS Take 220 mg by mouth as needed.    [provider]  phenazopyridine (PYRIDIUM) 95 MG tablet Take 95 mg by mouth 3 (three) times daily as needed for pain.    [provider]  PREMARIN  vaginal cream Place 1 application vaginally as needed. 04/30/17   [provider]  Probiotic Product (PROBIOTIC DAILY PO) Take 1 tablet by mouth daily.     [provider]  Vitamin D, Ergocalciferol, (DRISDOL) 50000 units CAPS capsule TAKE 1 CAPSULE WEEKLY 05/19/16   Reather Littler, MD      Allergies    Patient has no known allergies.    Review of Systems   Review of Systems  Physical Exam Updated Vital Signs BP (!) 154/70 (BP Location: Left Arm)   Pulse (!) 105   Temp 98 F (36.7 C) (Oral)   Resp 20   Ht 5\' 2"  (1.575 m)   Wt 130.6 kg   SpO2 95%   BMI 52.68 kg/m  Physical Exam Vitals and nursing note reviewed.  HENT:     Head: Normocephalic and atraumatic.  Eyes:     Pupils: Pupils are equal, round, and reactive to light.  Cardiovascular:     Rate and Rhythm: Normal rate and regular rhythm.  Pulmonary:     Effort: Pulmonary effort is normal.     Breath sounds: Normal breath sounds.  Abdominal:     Palpations: Abdomen is soft.     Tenderness: There is no abdominal tenderness.  Genitourinary:    Vagina: Vaginal discharge present.  Skin:    General: Skin is warm and dry.  Neurological:     Mental Status: She is alert.  Psychiatric:  Mood and Affect: Mood normal.     ED Results / Procedures / Treatments   Labs (all labs ordered are listed, but only abnormal results are displayed) Labs Reviewed  WET PREP, GENITAL - Abnormal; Notable for the following components:      Result Value   Clue Cells Wet Prep HPF POC PRESENT (*)    All other components within normal limits  BASIC METABOLIC PANEL - Abnormal; Notable for the following components:   Glucose, Bld 102 (*)    All other components within normal limits  CBC WITH DIFFERENTIAL/PLATELET - Abnormal; Notable for the following components:   WBC 11.0 (*)    Platelets 445 (*)    Neutro Abs 8.5 (*)    All other components within normal limits    EKG None  Radiology No results  found.  Procedures Procedures    Medications Ordered in ED Medications  lidocaine (XYLOCAINE) 2 % jelly 1 Application (has no administration in time range)    ED Course/ Medical Decision Making/ A&P Clinical Course as of 01/22/23 2125  Thu Jan 22, 2023  1728 After multiple attempts with Foley catheter in coud catheter, we were unable to successfully place Foley here in the ED.  Will speak with urology for further recommendations [MP]  1918 Discussed with Dr. Joyce Gross (urology) who recommended using a vaginal speculum and smaller size Foley catheter which we have available here.  Unfortunately this attempt was also unsuccessful.  Discussed again with Dr. Joyce Gross who recommends transferring patient to the [facility] long ED where she will evaluate and hopefully place a catheter.  Discussed with Cleon Dew, ED attending Dr. Doran Durand who accepts patient in transfer [MP]    Clinical Course User Index [MP] Royanne Foots, DO                                 Medical Decision Making 67 year old female with history as above returns for inability to void to completion.  Recent history of hemorrhagic cystitis.  Recently seen for this issue with obstruction related to blood clotting requiring Foley catheter placement.  Bladder scan reveals over 300 cc within bladder.  Will attempt to place Foley here and send for UA.  Will also send CBC and BMP to evaluate for leukocytosis and check renal function.  Amount and/or Complexity of Data Reviewed Labs: ordered.           Final Clinical Impression(s) / ED Diagnoses Final diagnoses:  History of cervical cancer  Urinary retention    Rx / DC Orders ED Discharge Orders     None         Royanne Foots, DO 01/22/23 2126

## 2023-01-22 NOTE — ED Triage Notes (Signed)
Pt POV from home reporting urinary retention + hematuria since Tuesday. Hx hemorrhagic cystitis, seen in June for same thing and had foley placed.

## 2023-01-22 NOTE — ED Notes (Signed)
Multiple attempts to place foley catheter. Dr. Elayne Snare called to room to assess white/gray vaginal discharge. Wet prep collected. Dr. Elayne Snare informed that foley catheter placement was unsuccessful.

## 2023-01-22 NOTE — ED Notes (Signed)
Called lab to run the wet prep.

## 2023-01-22 NOTE — ED Provider Notes (Addendum)
Patient has a ride from the [facility] for evaluation by urology.  She has had show cervical cancer and obstructive uropathy.  Patient reports she started having difficulty putting out urine for 2 days.  Are unable to pass the catheter at [facility].  Patient reports that she has put out a small amount of urine since her transfer to this emergency department.  She reports she still uncomfortable but not as much as she was previously. Physical Exam  BP (!) 154/70 (BP Location: Left Arm)   Pulse (!) 105   Temp 98 F (36.7 C) (Oral)   Resp 20   Ht 5\' 2"  (1.575 m)   Wt 130.6 kg   SpO2 95%   BMI 52.68 kg/m   Physical Exam Constitutional:      Comments: Patient is alert nontoxic.  Clear mental status.  No respiratory distress.  Clinically well in appearance.  Pulmonary:     Effort: Pulmonary effort is normal.  Abdominal:     Comments: Abdomen is very obese.  She does have discomfort to palpation of the lower abdomen but not severe pain.  Skin:    General: Skin is warm and dry.  Neurological:     General: No focal deficit present.     Mental Status: She is oriented to person, place, and time.     Coordination: Coordination normal.  Psychiatric:        Mood and Affect: Mood normal.     Procedures  Procedures Bedside informal ultrasound.  Bladder does still appear distended with estimated 400 to 500 cc fluid. ED Course / MDM   Clinical Course as of 01/22/23 2043  Thu Jan 22, 2023  1728 After multiple attempts with Foley catheter in coud catheter, we were unable to successfully place Foley here in the ED.  Will speak with urology for further recommendations [MP]    Clinical Course User Index [MP] Royanne Foots, DO   Medical Decision Making Amount and/or Complexity of Data Reviewed Labs: ordered.   Patient is transferred over for urology evaluation.  I would evaluate the patient at bedside.  She is nontoxic with clear mental status.  Vital signs are stable.  She has  put out a small amount of urine spontaneously and has less pain than previously.  However informal bedside ultrasound does still show urinary retention.  Patient requested lidocaine jelly for the catheter placement this has made it tolerable in the past.  I have messaged Dr. San Jetty of urology.  She will evaluate the patient in the emergency department for catheter placement.       Arby Barrette, MD 01/22/23 6578    Arby Barrette, MD 01/22/23 2049

## 2023-01-22 NOTE — ED Notes (Signed)
Report given to the Charge RN.

## 2023-01-22 NOTE — ED Notes (Signed)
Report attempted x3, to [employer]... Phone and message attempted.Marland KitchenMarland Kitchen

## 2023-01-22 NOTE — ED Notes (Signed)
After speaking to the urologist, attempted again with use of speculum base, lidocaine gel for comfort, and a smaller catheter size. Catheter unsuccessful. Patient beginning to have bleeding from attempts. Will notify provider.

## 2023-01-22 NOTE — ED Notes (Signed)
Report given to [facility]

## 2023-01-22 NOTE — Consult Note (Signed)
Urology Consult Note   Requesting Attending Physician:  Arby Barrette, MD Service Providing Consult: Urology  Consulting Attending: Dr. Mena Goes   Reason for Consult:  urinary retention, dificulty foley  HPI: Brandy Hardy is seen in consultation for reasons noted above at the request of Arby Barrette, MD  for evaluation of urinary retention/difficult foley.  This is a 67 y.o. female with a history of cervical cancer (hx of TAH/BSO 2003, chemoradiation), recurrent endometrial cancer, wound complications including necrotizing fasciitis of the right groin, pubic osteomyelitis with suprapubic debridement, and recurrent episodes of gross hematuria and clot retention beginning in June 2024. She presented to [facility] ED with urinary retention and was transferred to [facility] ED after failed catheter placement attempts.  She did undergo OR cystoscopy on 10/06/22 which demonstrated a foreshortened vagina with calcifications. She follows with [facility] and they attempted urodynamics in September however neither her provider nor nurse could place a foley so this was aborted. She has been voiding small volumes since that time. She denies fevers/chills/nausea/emesis. She is currently leaking pink urine via purewick.   I attempted for approximately 1 hour to place a urethral catheter but met significant difficulty given patient's morbid obesity, narrow vaginal introitus, radiation changes throughout her vaginal vault, and inability to visualize her meatus even with cystoscopy. She also had difficulty tolerating this due to pain. After exhausting these attempts, we elected to forgo urethral catheter placement in favor of suprapubic tube placement.  She currently takes gemtesa nad vesicare for her OAB.   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   Renal disorder     Past Surgical History:  Past Surgical History:  Procedure Laterality Date   ABDOMINAL HYSTERECTOMY     I & D EXTREMITY Right 02/20/2016   Procedure: IRRIGATION AND DEBRIDEMENT EXTREMITY RIGHT UPPER THIGH;  Surgeon: Claud Kelp, MD;  Location: [facility] OR;  Service: General;  Laterality: Right;   INCISION AND DRAINAGE PERIRECTAL ABSCESS N/A 06/13/2017   Procedure: IRRIGATION AND DEBRIDEMENT OF MONS PUBIS ABSCESS;  Surgeon: Berna Bue, MD;  Location: [facility] OR;  Service: General;  Laterality: N/A;   IRRIGATION AND DEBRIDEMENT ABSCESS Right 02/16/2016   Procedure: IRRIGATION AND DEBRIDEMENT RIGHT GROIN ABSCESS, FULL THICKNESS DEBRIDMENT RIGHT GROIN INCLUDING > 40CM SQUARE.;  Surgeon: Rodman Pickle, MD;  Location: [facility] OR;  Service: General;  Laterality: Right;   IRRIGATION AND DEBRIDEMENT ABSCESS Right 02/20/2016   Procedure: IRRIGATION AND DEBRIDEMENT ABSCESS;  Surgeon: Claud Kelp, MD;  Location: [facility] OR;  Service: General;  Laterality: Right;  Perii-Vaginal Abscess    Medication: Current Facility-Administered Medications  Medication Dose Route Frequency Provider Last Rate Last Admin   cefTRIAXone (ROCEPHIN) 2 g in sodium chloride 0.9 % 100 mL IVPB  2 g Intravenous Once Cathren Harsh, MD       lidocaine (XYLOCAINE) 2 % jelly 1 Application  1 Application Urethral Once Arby Barrette, MD       Current Outpatient Medications  Medication Sig Dispense Refill   amitriptyline (ELAVIL) 10 MG tablet Take 10 mg by mouth at bedtime.     atorvastatin (LIPITOR) 40 MG tablet TAKE 1 TABLET DAILY 90 tablet 0   estradiol (ESTRACE) 0.1 MG/GM vaginal cream Place 1 Applicatorful vaginally  as directed.     GEMTESA 75 MG TABS Take 1 tablet by mouth every morning.     Lactobacillus-Inulin (CULTURELLE DIGESTIVE HEALTH) CAPS Take by mouth.     Multiple Vitamin (MULTIVITAMIN WITH MINERALS) TABS tablet Take 1 tablet by mouth  daily. 30 tablet 0   phenazopyridine (PYRIDIUM) 95 MG tablet Take 95 mg by mouth 3 (three) times daily as needed for pain.     PREMARIN vaginal cream Place 1 application vaginally as needed.     Probiotic Product (PROBIOTIC DAILY PO) Take 1 tablet by mouth daily.      solifenacin (VESICARE) 5 MG tablet Take 1 tablet by mouth daily.     Vitamin D, Ergocalciferol, (DRISDOL) 50000 units CAPS capsule TAKE 1 CAPSULE WEEKLY (Patient taking differently: 50,000 Units See admin instructions. Twice weekly) 13 capsule 0    Allergies: No Known Allergies  Social History: Social History   Tobacco Use   Smoking status: Never   Smokeless tobacco: Never  Vaping Use   Vaping status: Never Used  Substance Use Topics   Alcohol use: No   Drug use: No    Family History Family History  Problem Relation Age of Onset   Hypertension Mother    Heart disease Father 29       Expired   Diabetes Neg Hx     Review of Systems 10 systems were reviewed and are negative except as noted specifically in the HPI.  Objective   Vital signs in last 24 hours: BP (!) 154/70 (BP Location: Left Arm)   Pulse (!) 105   Temp 98 F (36.7 C) (Oral)   Resp 20   Ht 5\' 2"  (1.575 m)   Wt 130.6 kg   SpO2 95%   BMI 52.68 kg/m   Physical Exam General: NAD, A&O, resting, appropriate HEENT: [place]/AT, EOMI, MMM Pulmonary: Normal work of breathing Cardiovascular: HDS, adequate peripheral perfusion Abdomen: Soft, NTTP, nondistended, obese. GU: Large suprapubic fat pad, narrow introitus, palpable calcifications throughout the vault, no meatus visualized Extremities: warm and well perfused Neuro: Appropriate, no focal neurological deficits  Most Recent Labs: Lab Results  Component Value Date   WBC 11.0 (H) 01/22/2023   HGB 13.9 01/22/2023   HCT 41.8 01/22/2023   PLT 445 (H) 01/22/2023    Lab Results  Component Value Date   NA 139 01/22/2023   K 3.9 01/22/2023   CL 100 01/22/2023   CO2 30 01/22/2023   BUN 16  01/22/2023   CREATININE 0.58 01/22/2023   CALCIUM 9.7 01/22/2023   MG 1.9 06/18/2017   PHOS 2.1 (L) 02/15/2016    Lab Results  Component Value Date   INR 1.05 06/12/2017     Urine Culture: @[id number] (laburin,org,[id number])@   IMAGING: No results found.  ------  Assessment:  67 y.o. female with a history of cervical cancer s/p TAH/chemo/radiation who presents with urinary retention. Unable to place urethral foley given no meatus visualized and significant vaginal introitus friability/narrowing.    Recommendations: - Patient discussed with [facility], they will plan to place suprapubic tube on 01/23/23 - NPO after midnight, limit fluids overnight to minimize bladder filling - Dose of ceftriaxone ordered for urinary manipulation   Thank you for this consult. Please contact the urology consult pager with any further questions/concerns.

## 2023-01-23 ENCOUNTER — Inpatient Hospital Stay (HOSPITAL_COMMUNITY): Payer: Medicare Other

## 2023-01-23 DIAGNOSIS — R339 Retention of urine, unspecified: Secondary | ICD-10-CM | POA: Diagnosis not present

## 2023-01-23 LAB — BASIC METABOLIC PANEL
Anion gap: 11 (ref 5–15)
BUN: 22 mg/dL (ref 8–23)
CO2: 24 mmol/L (ref 22–32)
Calcium: 8.7 mg/dL — ABNORMAL LOW (ref 8.9–10.3)
Chloride: 101 mmol/L (ref 98–111)
Creatinine, Ser: 1.18 mg/dL — ABNORMAL HIGH (ref 0.44–1.00)
GFR, Estimated: 51 mL/min — ABNORMAL LOW (ref >60)
Glucose, Bld: 128 mg/dL — ABNORMAL HIGH (ref 70–99)
Potassium: 4.1 mmol/L (ref 3.5–5.1)
Sodium: 136 mmol/L (ref 135–145)

## 2023-01-23 LAB — CBC WITH DIFFERENTIAL/PLATELET
Abs Immature Granulocytes: 0.05 10*3/uL (ref 0.00–0.07)
Basophils Absolute: 0 10*3/uL (ref 0.0–0.1)
Basophils Relative: 0 %
Eosinophils Absolute: 0 10*3/uL (ref 0.0–0.5)
Eosinophils Relative: 0 %
HCT: 39.2 % (ref 36.0–46.0)
Hemoglobin: 13 g/dL (ref 12.0–15.0)
Immature Granulocytes: 0 %
Lymphocytes Relative: 5 %
Lymphs Abs: 0.6 10*3/uL — ABNORMAL LOW (ref 0.7–4.0)
MCH: 32.3 pg (ref 26.0–34.0)
MCHC: 33.2 g/dL (ref 30.0–36.0)
MCV: 97.5 fL (ref 80.0–100.0)
Monocytes Absolute: 0.6 10*3/uL (ref 0.1–1.0)
Monocytes Relative: 4 %
Neutro Abs: 11.2 10*3/uL — ABNORMAL HIGH (ref 1.7–7.7)
Neutrophils Relative %: 91 %
Platelets: 374 10*3/uL (ref 150–400)
RBC: 4.02 MIL/uL (ref 3.87–5.11)
RDW: 13.7 % (ref 11.5–15.5)
WBC: 12.4 10*3/uL — ABNORMAL HIGH (ref 4.0–10.5)
nRBC: 0 % (ref 0.0–0.2)

## 2023-01-23 LAB — URINALYSIS, ROUTINE W REFLEX MICROSCOPIC
Bilirubin Urine: NEGATIVE
Glucose, UA: NEGATIVE mg/dL
Ketones, ur: NEGATIVE mg/dL
Leukocytes,Ua: NEGATIVE
Nitrite: NEGATIVE
Protein, ur: NEGATIVE mg/dL
Specific Gravity, Urine: 1.009 (ref 1.005–1.030)
pH: 6 (ref 5.0–8.0)

## 2023-01-23 LAB — PROTIME-INR
INR: 1 (ref 0.8–1.2)
Prothrombin Time: 13.7 s (ref 11.4–15.2)

## 2023-01-23 MED ORDER — ATORVASTATIN CALCIUM 20 MG PO TABS
80.0000 mg | ORAL_TABLET | Freq: Every day | ORAL | Status: DC
  Administered 2023-01-23 – 2023-01-25 (×3): 80 mg via ORAL
  Filled 2023-01-23 (×3): qty 4

## 2023-01-23 MED ORDER — OXYCODONE HCL 5 MG PO TABS
5.0000 mg | ORAL_TABLET | ORAL | Status: DC | PRN
  Administered 2023-01-24 – 2023-01-25 (×4): 5 mg via ORAL
  Filled 2023-01-23 (×5): qty 1

## 2023-01-23 MED ORDER — SODIUM CHLORIDE 0.9 % IV SOLN
1.0000 g | INTRAVENOUS | Status: AC
  Administered 2023-01-23 – 2023-01-24 (×2): 1 g via INTRAVENOUS
  Filled 2023-01-23 (×2): qty 10

## 2023-01-23 MED ORDER — FENTANYL CITRATE (PF) 100 MCG/2ML IJ SOLN
INTRAMUSCULAR | Status: AC | PRN
Start: 2023-01-23 — End: 2023-01-23
  Administered 2023-01-23: 50 ug via INTRAVENOUS

## 2023-01-23 MED ORDER — MIDAZOLAM HCL 2 MG/2ML IJ SOLN
INTRAMUSCULAR | Status: AC | PRN
Start: 2023-01-23 — End: 2023-01-23
  Administered 2023-01-23: 1 mg via INTRAVENOUS

## 2023-01-23 MED ORDER — MIDAZOLAM HCL 2 MG/2ML IJ SOLN
INTRAMUSCULAR | Status: AC
  Filled 2023-01-23: qty 4

## 2023-01-23 MED ORDER — NALOXONE HCL 0.4 MG/ML IJ SOLN
INTRAMUSCULAR | Status: AC
  Filled 2023-01-23: qty 1

## 2023-01-23 MED ORDER — FLUMAZENIL 0.5 MG/5ML IV SOLN
INTRAVENOUS | Status: AC
  Filled 2023-01-23: qty 5

## 2023-01-23 MED ORDER — AMITRIPTYLINE HCL 10 MG PO TABS
10.0000 mg | ORAL_TABLET | Freq: Every day | ORAL | Status: DC
  Administered 2023-01-23 – 2023-01-24 (×2): 10 mg via ORAL
  Filled 2023-01-23 (×3): qty 1

## 2023-01-23 MED ORDER — FENTANYL CITRATE (PF) 100 MCG/2ML IJ SOLN
INTRAMUSCULAR | Status: AC
  Filled 2023-01-23: qty 4

## 2023-01-23 MED ORDER — HEPARIN SODIUM (PORCINE) 5000 UNIT/ML IJ SOLN: 5000.0000 [IU] | Freq: Three times a day (TID) | INTRAMUSCULAR | Status: DC

## 2023-01-23 MED ORDER — SODIUM CHLORIDE 0.9% FLUSH
5.0000 mL | Freq: Three times a day (TID) | INTRAVENOUS | Status: DC
  Administered 2023-01-23 – 2023-01-25 (×5): 5 mL

## 2023-01-23 MED ORDER — HEPARIN SODIUM (PORCINE) 5000 UNIT/ML IJ SOLN
5000.0000 [IU] | Freq: Three times a day (TID) | INTRAMUSCULAR | Status: DC
  Administered 2023-01-23 – 2023-01-25 (×5): 5000 [IU] via SUBCUTANEOUS
  Filled 2023-01-23 (×5): qty 1

## 2023-01-23 MED ORDER — METRONIDAZOLE 500 MG PO TABS
500.0000 mg | ORAL_TABLET | Freq: Two times a day (BID) | ORAL | Status: DC
  Administered 2023-01-23 – 2023-01-25 (×5): 500 mg via ORAL
  Filled 2023-01-23 (×5): qty 1

## 2023-01-23 NOTE — Progress Notes (Signed)
     Subjective: No acute events overnight.  Patient is passing small amounts of urine.  Spoke for some time with her and her husband who joined [place] by phone.  All questions were answered to their satisfaction.  They are feeling much more confident about the path forward this morning  Objective: Vital signs in last 24 hours: Temp:  [98 F (36.7 C)-98.7 F (37.1 C)] 98.4 F (36.9 C) (10/18 0344) Pulse Rate:  [88-105] 89 (10/18 0344) Resp:  [12-20] 18 (10/18 0344) BP: (126-154)/(59-89) 144/61 (10/18 0344) SpO2:  [93 %-99 %] 99 % (10/18 0344) Weight:  [130.6 kg] 130.6 kg (10/17 1512)  Assessment/Plan: 67 year old female with history of cervical cancer-s/p chemoradiation 2003, recurrent endometrial cancer, wound complications of the right groin including necrotizing fasciitis, pubic osteomyelitis with suprapubic debridement, and gross hematuria with clot retention.  # Obliterated urethral meatus # Urinary retention # Hematuria  Multiple instances in the notes of inability to place catheter due to narrowing and friability of the vaginal introitus and difficult to visualize meatus-s/p radiation changes. To the OR with interventional radiology for suprapubic tube placement today-10/18. Continue to trend labs.  Interval doubling in serum creatinine, though not yet concerning only elevated. Continue PureWick  Intake/Output from previous day: 10/17 0701 - 10/18 0700 In: 100 [IV Piggyback:100] Out: -   Intake/Output this shift: No intake/output data recorded.  Physical Exam:  General: Alert and oriented CV: No cyanosis Lungs: equal chest rise Gu: Pure wick catheter in place draining clear yellow urine.  Lab Results: Recent Labs    01/22/23 1558 01/23/23 0221  HGB 13.9 13.0  HCT 41.8 39.2   BMET Recent Labs    01/22/23 1558 01/23/23 0427  NA 139 136  K 3.9 4.1  CL 100 101  CO2 30 24  GLUCOSE 102* 128*  BUN 16 22  CREATININE 0.58 1.18*  CALCIUM 9.7 8.7*      Studies/Results: No results found.    LOS: 1 day   Elmon Kirschner, NP [facility] Pager: [phone]  01/23/2023, 8:23 AM

## 2023-01-23 NOTE — Consult Note (Addendum)
Chief Complaint: Patient was seen in consultation today for CT-guided suprapubic catheter placement  Chief Complaint  Patient presents with   Urinary Retention    Referring Physician(s): Eskridge,M  Supervising Physician: Marliss Coots  Patient Status: [facility] - In-pt  History of Present Illness: Brandy Hardy is a 67 y.o. female with past medical history significant for morbid obesity, cervical/vaginal/endometrial cancer with prior chemoradiation and subsequent wound complications of the right groin including necrotizing fasciitis, pubic osteomyelitis with suprapubic debridement now with gross hematuria and clot retention.  Patient has history of obliterated urethral meatus with urinary retention and multiple instances of inability to place urinary catheter due to narrowing friability of the vaginal introitus and difficult to visualize meatus.  Now presents with urinary retention, bladder spasms.  Unsuccessful attempt at Foley placement in ED by urology.  Request  received from urology for suprapubic catheter placement.  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   Renal disorder     Past Surgical History:  Procedure Laterality Date   ABDOMINAL HYSTERECTOMY     I & D EXTREMITY Right 02/20/2016   Procedure: IRRIGATION AND DEBRIDEMENT EXTREMITY RIGHT UPPER THIGH;  Surgeon: Claud Kelp, MD;  Location: [facility] OR;  Service: General;  Laterality: Right;   INCISION AND DRAINAGE PERIRECTAL ABSCESS N/A 06/13/2017   Procedure: IRRIGATION AND DEBRIDEMENT OF MONS PUBIS ABSCESS;  Surgeon: Berna Bue, MD;  Location: [facility] OR;  Service: General;  Laterality: N/A;   IRRIGATION AND DEBRIDEMENT ABSCESS Right 02/16/2016   Procedure: IRRIGATION AND DEBRIDEMENT RIGHT GROIN ABSCESS, FULL THICKNESS DEBRIDMENT  RIGHT GROIN INCLUDING > 40CM SQUARE.;  Surgeon: Rodman Pickle, MD;  Location: [facility] OR;  Service: General;  Laterality: Right;   IRRIGATION AND DEBRIDEMENT ABSCESS Right 02/20/2016   Procedure: IRRIGATION AND DEBRIDEMENT ABSCESS;  Surgeon: Claud Kelp, MD;  Location: [facility] OR;  Service: General;  Laterality: Right;  Perii-Vaginal Abscess    Allergies: Patient has no known allergies.  Medications: Prior to Admission medications   Medication Sig Start Date End Date Taking? Authorizing Provider  acetaminophen (TYLENOL) 325 MG tablet Take 650 mg by mouth every 6 (six) hours as needed for mild pain (pain score 1-3) or moderate pain (pain score 4-6).   Yes [provider]  amitriptyline (ELAVIL) 10 MG tablet Take 10 mg by mouth at bedtime.   Yes [provider]  ibuprofen (ADVIL) 200 MG tablet Take 200 mg by mouth every 6 (six) hours as needed for mild pain (pain score 1-3) or moderate pain (pain score 4-6).   Yes [provider]  Multiple Vitamin (MULTIVITAMIN WITH MINERALS) TABS tablet Take 1 tablet by mouth daily. 03/06/16  Yes Love, Evlyn Kanner, PA-C  atorvastatin (LIPITOR) 40 MG tablet TAKE 1 TABLET DAILY Patient not taking: Reported on 01/22/2023 05/19/16   Reather Littler, MD  estradiol (ESTRACE) 0.1 MG/GM vaginal cream Place 1 Applicatorful vaginally as directed. Patient not taking: Reported on 01/22/2023 07/03/22 09/22/23  [provider]  GEMTESA 75 MG TABS Take 1 tablet by mouth every morning. Patient not taking: Reported on 01/22/2023 07/24/22   [provider]  Lactobacillus-Inulin (CULTURELLE DIGESTIVE HEALTH) CAPS Take by mouth. Patient not taking: Reported on 01/22/2023    [provider]  phenazopyridine (PYRIDIUM) 95 MG  tablet Take 95 mg by mouth 3 (three) times daily as needed for pain. Patient not taking: Reported on 01/22/2023    [provider]  PREMARIN vaginal cream Place 1 application vaginally as needed. Patient not  taking: Reported on 01/22/2023 04/30/17   [provider]  Probiotic Product (PROBIOTIC DAILY PO) Take 1 tablet by mouth daily.  Patient not taking: Reported on 01/22/2023    [provider]  solifenacin (VESICARE) 5 MG tablet Take 1 tablet by mouth daily. Patient not taking: Reported on 01/22/2023 01/05/23 01/05/24  [provider]  Vitamin D, Ergocalciferol, (DRISDOL) 50000 units CAPS capsule TAKE 1 CAPSULE WEEKLY Patient taking differently: 50,000 Units See admin instructions. Twice weekly 05/19/16   Reather Littler, MD     Family History  Problem Relation Age of Onset   Hypertension Mother    Heart disease Father 65       Expired   Diabetes Neg Hx     Social History   Socioeconomic History   Marital status: Married    Spouse name: Not on file   Number of children: Not on file   Years of education: Not on file   Highest education level: Not on file  Occupational History   Not on file  Tobacco Use   Smoking status: Never   Smokeless tobacco: Never  Vaping Use   Vaping status: Never Used  Substance and Sexual Activity   Alcohol use: No   Drug use: No   Sexual activity: Not Currently  Other Topics Concern   Not on file  Social History Narrative   Not on file   Social Determinants of Health   Financial Resource Strain: Not on file  Food Insecurity: No Food Insecurity (01/23/2023)   Hunger Vital Sign    Worried About Running Out of Food in the Last Year: Never true    Ran Out of Food in the Last Year: Never true  Transportation Needs: No Transportation Needs (01/23/2023)   PRAPARE - Administrator, Civil Service (Medical): No    Lack of Transportation (Non-Medical): No  Physical Activity: Not on file  Stress: Not on file  Social Connections: Not on file      Review of Systems denies fever, headache, chest pain, dyspnea, cough, nausea, vomiting or bleeding.  She does have some occasional lower abdominal/pelvic discomfort/fullness  and intermittent back pain  Vital Signs: BP (!) 144/61 (BP Location: Left Arm)   Pulse 89   Temp 98.4 F (36.9 C) (Oral)   Resp 18   Ht 5\' 2"  (1.575 m)   Wt 288 lb (130.6 kg)   SpO2 99%   BMI 52.68 kg/m   Advance Care Plan: No documents on file   Physical Exam; awake, alert.  Chest clear to auscultation bilaterally. Clean,intact rt chest port a cath;  Heart with regular rate and rhythm.  Abdomen obese, soft, positive bowel sounds, mildly tender anterior lower abdominal/pelvic region.  No significant lower extremity edema.  Imaging: No results found.  Labs:  CBC: Recent Labs    09/15/22 1947 01/22/23 1558 01/23/23 0221  WBC 10.6* 11.0* 12.4*  HGB 16.2* 13.9 13.0  HCT 48.5* 41.8 39.2  PLT 340 445* 374    COAGS: No results for input(s): "INR", "APTT" in the last 8760 hours.  BMP: Recent Labs    09/15/22 1947 01/22/23 1558 01/23/23 0427  NA 136 139 136  K 4.2 3.9 4.1  CL 103 100 101  CO2 21* 30 24  GLUCOSE 142* 102* 128*  BUN 15 16 22   CALCIUM 9.3 9.7 8.7*  CREATININE 0.76 0.58 1.18*  GFRNONAA >60 >60 51*    LIVER FUNCTION TESTS: No results for input(s): "BILITOT", "AST", "ALT", "ALKPHOS", "PROT", "ALBUMIN" in the last 8760 hours.  TUMOR MARKERS: No results for input(s): "AFPTM", "CEA", "CA199", "CHROMGRNA" in the last 8760 hours.  Assessment and Plan: 67 y.o. female with past medical history significant for morbid obesity, cervical/vaginal/endometrial cancer with prior chemoradiation and subsequent wound complications of the right groin including necrotizing fasciitis, pubic osteomyelitis with suprapubic debridement now with gross hematuria and clot retention.  Patient has history of obliterated urethral meatus with urinary retention and multiple instances of inability to place urinary catheter due to narrowing friability of the vaginal introitus and difficult to visualize meatus.  Now presents with urinary retention, bladder spasms.  Unsuccessful attempt  at Foley placement in ED by urology.  Request  received from urology for suprapubic catheter placement.  Imaging studies have been reviewed by Dr. Elby Showers.  Details/risks of above procedure, including but not limited to, internal bleeding, infection, injury to adjacent structures discussed with patient/spouse with their understanding and consent.  Procedure scheduled for today.   Thank you for this interesting consult.  I greatly enjoyed meeting Brandy Hardy and look forward to participating in their care.  A copy of this report was sent to the requesting provider on this date.  Electronically Signed: D. Jeananne Rama, PA-C 01/23/2023, 8:32 AM   I spent a total of  25 minutes in face to face in clinical consultation, greater than 50% of which was counseling/coordinating care for CT guided suprapubic catheter placement

## 2023-01-23 NOTE — Progress Notes (Signed)
   01/23/23 1127  TOC Brief Assessment  Insurance and Status Reviewed  Patient has primary care physician Yes  Home environment has been reviewed Resides with spouse  Prior level of function: Independent at baseline  Prior/Current Home Services No current home services  Social Determinants of Health Reivew SDOH reviewed no interventions necessary  Readmission risk has been reviewed Yes  Transition of care needs no transition of care needs at this time

## 2023-01-23 NOTE — Progress Notes (Signed)
Pt s/p CT guided SP tube placement. CT for SP tube guidance with distended bladder and mild bilateral hydronephrosis likely due to retention and decreased bladder compliance. Also patient's Cr elevated today.

## 2023-01-23 NOTE — Procedures (Signed)
Interventional Radiology Procedure Note  Procedure: CT and ultrasound guided suprapubic catheter placement  Findings: Please refer to procedural dictation for full description. 16 Fr pigtail suprapubic catheter, to bag drainage.  Complications: None immediate  Estimated Blood Loss:  < 5 mL  Recommendations: To bag drainage. IR will arrange for 1 month catheter exchange for Foley-type as outpatient.   Marliss Coots, MD

## 2023-01-23 NOTE — Progress Notes (Signed)
PROGRESS NOTE    Brandy Hardy  KZS:[id number] DOB: 04-05-1956 DOA: 01/22/2023 PCP: Irven Coe, MD  Outpatient Specialists:     Brief Narrative:  Patient is a 67 year old female with complicated medical and urologic history.  Patient is moderately obese.  History of stage II cervical cancer, with concurrent endometrial cancer status post chemoradiation in 2016.  Patient has history of hemorrhagic cystitis with clots leading to obstruction.  Patient presents with urinary obstruction and decreased urine output.  Patient has also had suprapubic catheter placed.  Patient was given 1 dose of Rocephin yesterday.  Will order urinalysis and urine culture.  Meanwhile, we will continue IV Rocephin for now.  01/23/2023: Patient seen alongside patient's nurse.  Above history noted.   Assessment & Plan:   Principal Problem:   Urinary retention Active Problems:   History of cervical cancer   [facility]    Urinary retention -Patient seen by urologist in ER. -Unsuccessful attempted Foley placement. -Suprapubic catheter has always been placed.   -Urinalysis and urine culture. -Continue IV Rocephin for now.    Bacterial vaginosis -++ Clue cells on wet prep.  Flagyl ordered    History of stage II cervical adenocarcinoma, recurrent endometrial cancer, -In remission  Possible acute kidney injury: -Mild. -Repeat renal function in the morning. -Urinalysis.   DVT prophylaxis: SCD.  Code Status: Full code. Family Communication:  Disposition Plan:    Consultants:  Urology.  Procedures:  Suprapubic catheter.  Antimicrobials:  Continue IV antibiotics.   Subjective: -No fever or chills. -No pain.  Objective: Vitals:   01/23/23 1550 01/23/23 1555 01/23/23 1631 01/23/23 1735  BP: (!) 114/57 (!) 130/49 (!) 135/58 (!) 129/52  Pulse: 91 93 74 69  Resp: 18 16 18 16   Temp:   98.1 F (36.7 C) 97.9 F (36.6 C)  TempSrc:   Oral Oral  SpO2: 97% 97% 96% 100%  Weight:       Height:        Intake/Output Summary (Last 24 hours) at 01/23/2023 1739 Last data filed at 01/23/2023 1605 Gross per 24 hour  Intake 100 ml  Output 560 ml  Net -460 ml   Filed Weights   01/22/23 1512  Weight: 130.6 kg    Examination:  General exam: Appears calm and comfortable.  Patient is morbidly obese. Respiratory system: Clear to auscultation.  Cardiovascular system: S1 & S2  Gastrointestinal system: Morbidly obese, soft and nontender.  Organs are difficult to assess.  Suprapubic catheter in place. Central nervous system: Alert and oriented.  Patient moves all extremities..   Data Reviewed: I have personally reviewed following labs and imaging studies  CBC: Recent Labs  Lab 01/22/23 1558 01/23/23 0221  WBC 11.0* 12.4*  NEUTROABS 8.5* 11.2*  HGB 13.9 13.0  HCT 41.8 39.2  MCV 95.0 97.5  PLT 445* 374   Basic Metabolic Panel: Recent Labs  Lab 01/22/23 1558 01/23/23 0427  NA 139 136  K 3.9 4.1  CL 100 101  CO2 30 24  GLUCOSE 102* 128*  BUN 16 22  CREATININE 0.58 1.18*  CALCIUM 9.7 8.7*   GFR: Estimated Creatinine Clearance: 60.1 mL/min (A) (by C-G formula based on SCr of 1.18 mg/dL (H)). Liver Function Tests: No results for input(s): "AST", "ALT", "ALKPHOS", "BILITOT", "PROT", "ALBUMIN" in the last 168 hours. No results for input(s): "LIPASE", "AMYLASE" in the last 168 hours. No results for input(s): "AMMONIA" in the last 168 hours. Coagulation Profile: Recent Labs  Lab 01/23/23 0922  INR 1.0  Cardiac Enzymes: No results for input(s): "CKTOTAL", "CKMB", "CKMBINDEX", "TROPONINI" in the last 168 hours. BNP (last 3 results) No results for input(s): "PROBNP" in the last 8760 hours. HbA1C: No results for input(s): "HGBA1C" in the last 72 hours. CBG: No results for input(s): "GLUCAP" in the last 168 hours. Lipid Profile: No results for input(s): "CHOL", "HDL", "LDLCALC", "TRIG", "CHOLHDL", "LDLDIRECT" in the last 72 hours. Thyroid Function  Tests: No results for input(s): "TSH", "T4TOTAL", "FREET4", "T3FREE", "THYROIDAB" in the last 72 hours. Anemia Panel: No results for input(s): "VITAMINB12", "FOLATE", "FERRITIN", "TIBC", "IRON", "RETICCTPCT" in the last 72 hours. Urine analysis:    Component Value Date/Time   COLORURINE AMBER (A) 09/15/2022 2333   APPEARANCEUR CLOUDY (A) 09/15/2022 2333   LABSPEC 1.020 09/15/2022 2333   PHURINE 6.0 09/15/2022 2333   GLUCOSEU NEGATIVE 09/15/2022 2333   GLUCOSEU NEGATIVE 02/21/2013 0830   HGBUR MODERATE (A) 09/15/2022 2333   BILIRUBINUR negative 10/11/2022 1155   KETONESUR trace (5) (A) 10/11/2022 1155   KETONESUR NEGATIVE 09/15/2022 2333   PROTEINUR =100 (A) 10/11/2022 1155   PROTEINUR 100 (A) 09/15/2022 2333   UROBILINOGEN 1.0 10/11/2022 1155   UROBILINOGEN 0.2 02/21/2013 0830   NITRITE Positive (A) 10/11/2022 1155   NITRITE NEGATIVE 09/15/2022 2333   LEUKOCYTESUR Large (3+) (A) 10/11/2022 1155   LEUKOCYTESUR NEGATIVE 09/15/2022 2333   Sepsis Labs: @LABRCNTIP (procalcitonin:4,lacticidven:4)  ) Recent Results (from the past 240 hour(s))  Wet prep, genital     Status: Abnormal   Collection Time: 01/22/23  5:35 PM  Result Value Ref Range Status   Yeast Wet Prep HPF POC NONE SEEN NONE SEEN Final   Trich, Wet Prep NONE SEEN NONE SEEN Final   Clue Cells Wet Prep HPF POC PRESENT (A) NONE SEEN Final   WBC, Wet Prep HPF POC <10 <10 Final    Comment: Specimen diluted due to transport tube containing more than 1 ml of saline, interpret results with caution.   Sperm NONE SEEN  Final    Comment: Performed at [facility], [address] [place], [place] [address]         Radiology Studies: CT GUIDED SUPERPUBIC CATHETER PLMT  Result Date: 01/23/2023 INDICATION: 67 year old female with history of cervical cancer and bladder outlet obstruction. EXAM: ULTRASOUND AND CT GUIDED SUPRAPUBIC CATHETER PLACEMENT COMPARISON:  None Available. MEDICATIONS: None  ANESTHESIA/SEDATION: Moderate (conscious) sedation was employed during this procedure. A total of Versed 2 mg and Fentanyl 200 mcg was administered intravenously. Moderate Sedation Time: 16 minutes. The patient's level of consciousness and vital signs were monitored continuously by radiology nursing throughout the procedure under my direct supervision. CONTRAST:  None COMPLICATIONS: None immediate. PROCEDURE: The procedure, risks, benefits, and alternatives were explained to the patient. Questions regarding the procedure were encouraged and answered. The patient understands and consents to the procedure. A timeout was performed prior to the initiation of the procedure. The patient was positioned supine on the CT gantry. Preprocedural imaging was obtained of the lower pelvis. The skin overlying the anterior aspect the lower pelvis was prepped and draped in usual sterile fashion. Under direct ultrasound guidance, a 22 gauge needle was utilized for the purposes of procedural planning after the overlying soft tissues were anesthetized with 1% lidocaine. Appropriate trajectory was confirmed with a limited lower abdominal/pelvic CT. Next, an 18 gauge trocar needle was advanced into the urinary bladder under direct ultrasound guidance. Ultrasound image was saved for procedural documentation purposes. A short Amplatz wire was coiled within the urinary bladder. Appropriate  position was confirmed with ultrasound. The track was serially dilated ultimately allowing placement of a 16 French all-purpose drainage catheter with end coiled and locked within the urinary bladder. The catheter was connected to a gravity bag. The exit site of the catheter was secured within interrupted suture. A dressing was placed. The patient tolerated the procedure well without immediate postprocedural complication. FINDINGS: Distended bladder. There is no bowel interposed between the anterior aspect of the urinary bladder and the ventral lower  abdominal/pelvic wall. Ultrasound and CT was utilized for the placement of a 16 French percutaneous drainage catheter with end coiled and locked within the urinary bladder. IMPRESSION: Technically successful ultrasound and CT-guided placement of a 16 French suprapubic catheter. PLAN: Recommend fluoroscopic guided exchange of this suprapubic drainage catheter in approximately 6-8 weeks. Marliss Coots, MD [facility] Electronically Signed   By: Marliss Coots M.D.   On: 01/23/2023 17:06        Scheduled Meds:  amitriptyline  10 mg Oral QHS   atorvastatin  80 mg Oral Daily   heparin  5,000 Units Subcutaneous Q8H   lidocaine  1 Application Urethral Once   metroNIDAZOLE  500 mg Oral Q12H   sodium chloride flush  5 mL Intracatheter Q8H   Continuous Infusions:   LOS: 1 day    Time spent: 35 minutes.    Berton Mount, MD  [facility] Pager #: [phone] 7PM-7AM contact night coverage as above

## 2023-01-23 NOTE — Plan of Care (Signed)
[facility] Tonsillectomy/Adenoidectomy, Postoperative PEDS care plan entered in error.

## 2023-01-23 NOTE — Plan of Care (Signed)
  Problem: Respiratory: Goal: Will regain and/or maintain adequate ventilation Outcome: Progressing   Problem: Education: Goal: Knowledge of General Education information will improve Description: Including pain rating scale, medication(s)/side effects and non-pharmacologic comfort measures Outcome: Progressing   Problem: Clinical Measurements: Goal: Will remain free from infection Outcome: Progressing   Problem: Activity: Goal: Risk for activity intolerance will decrease Outcome: Progressing   Problem: Nutrition: Goal: Adequate nutrition will be maintained Outcome: Progressing   Problem: Elimination: Goal: Will not experience complications related to bowel motility Outcome: Progressing   Problem: Pain Managment: Goal: General experience of comfort will improve Outcome: Progressing   Problem: Safety: Goal: Ability to remain free from injury will improve Outcome: Progressing   Problem: Skin Integrity: Goal: Risk for impaired skin integrity will decrease Outcome: Progressing

## 2023-01-24 ENCOUNTER — Inpatient Hospital Stay (HOSPITAL_COMMUNITY): Payer: Medicare Other

## 2023-01-24 DIAGNOSIS — R339 Retention of urine, unspecified: Secondary | ICD-10-CM | POA: Diagnosis not present

## 2023-01-24 LAB — CBC WITH DIFFERENTIAL/PLATELET
Abs Immature Granulocytes: 0.06 10*3/uL (ref 0.00–0.07)
Basophils Absolute: 0 10*3/uL (ref 0.0–0.1)
Basophils Relative: 0 %
Eosinophils Absolute: 0.1 10*3/uL (ref 0.0–0.5)
Eosinophils Relative: 1 %
HCT: 36.9 % (ref 36.0–46.0)
Hemoglobin: 11.8 g/dL — ABNORMAL LOW (ref 12.0–15.0)
Immature Granulocytes: 1 %
Lymphocytes Relative: 8 %
Lymphs Abs: 0.9 10*3/uL (ref 0.7–4.0)
MCH: 32.1 pg (ref 26.0–34.0)
MCHC: 32 g/dL (ref 30.0–36.0)
MCV: 100.3 fL — ABNORMAL HIGH (ref 80.0–100.0)
Monocytes Absolute: 1.1 10*3/uL — ABNORMAL HIGH (ref 0.1–1.0)
Monocytes Relative: 10 %
Neutro Abs: 9.5 10*3/uL — ABNORMAL HIGH (ref 1.7–7.7)
Neutrophils Relative %: 80 %
Platelets: 334 10*3/uL (ref 150–400)
RBC: 3.68 MIL/uL — ABNORMAL LOW (ref 3.87–5.11)
RDW: 14.1 % (ref 11.5–15.5)
WBC: 11.6 10*3/uL — ABNORMAL HIGH (ref 4.0–10.5)
nRBC: 0 % (ref 0.0–0.2)

## 2023-01-24 LAB — RENAL FUNCTION PANEL
Albumin: 2.7 g/dL — ABNORMAL LOW (ref 3.5–5.0)
Anion gap: 11 (ref 5–15)
BUN: 35 mg/dL — ABNORMAL HIGH (ref 8–23)
CO2: 27 mmol/L (ref 22–32)
Calcium: 8.5 mg/dL — ABNORMAL LOW (ref 8.9–10.3)
Chloride: 101 mmol/L (ref 98–111)
Creatinine, Ser: 2.87 mg/dL — ABNORMAL HIGH (ref 0.44–1.00)
GFR, Estimated: 17 mL/min — ABNORMAL LOW (ref >60)
Glucose, Bld: 124 mg/dL — ABNORMAL HIGH (ref 70–99)
Phosphorus: 4.9 mg/dL — ABNORMAL HIGH (ref 2.5–4.6)
Potassium: 3.5 mmol/L (ref 3.5–5.1)
Sodium: 139 mmol/L (ref 135–145)

## 2023-01-24 LAB — MAGNESIUM: Magnesium: 2.2 mg/dL (ref 1.7–2.4)

## 2023-01-24 MED ORDER — SODIUM CHLORIDE 0.9% FLUSH
10.0000 mL | INTRAVENOUS | Status: DC | PRN
  Administered 2023-01-25: 10 mL

## 2023-01-24 MED ORDER — CHLORHEXIDINE GLUCONATE CLOTH 2 % EX PADS
6.0000 | MEDICATED_PAD | Freq: Every day | CUTANEOUS | Status: DC
  Administered 2023-01-24 – 2023-01-25 (×2): 6 via TOPICAL

## 2023-01-24 MED ORDER — SODIUM CHLORIDE 0.9% FLUSH
10.0000 mL | Freq: Two times a day (BID) | INTRAVENOUS | Status: DC
  Administered 2023-01-24: 10 mL

## 2023-01-24 MED ORDER — DEXTROSE-SODIUM CHLORIDE 5-0.9 % IV SOLN: INTRAVENOUS | Status: DC

## 2023-01-24 MED ORDER — SIMETHICONE 80 MG PO CHEW: 80.0000 mg | CHEWABLE_TABLET | Freq: Four times a day (QID) | ORAL | Status: DC | PRN

## 2023-01-24 NOTE — Progress Notes (Signed)
Mobility Specialist - Progress Note   01/24/23 1054  Mobility  Activity Ambulated with assistance in hallway  Level of Assistance Minimal assist, patient does 75% or more  Assistive Device Front wheel walker  Distance Ambulated (ft) 40 ft  Range of Motion/Exercises Active  Activity Response Tolerated fair  Mobility Referral Yes  $Mobility charge 1 Mobility  Mobility Specialist Start Time (ACUTE ONLY) 1040  Mobility Specialist Stop Time (ACUTE ONLY) 1054  Mobility Specialist Time Calculation (min) (ACUTE ONLY) 14 min   Pt was found in bed and agreeable to ambulate. Pt was min-A for bed mobility and SB for ambulation. At EOS returned to use bathroom. Instructed pt to pull call bell when finished.   Billey Chang Mobility Specialist

## 2023-01-24 NOTE — Progress Notes (Signed)
PROGRESS NOTE    Brandy Hardy  ZOX:[id number] DOB: 1955/12/31 DOA: 01/22/2023 PCP: Irven Coe, MD   Brief Narrative:  67 year old female with past medical history significant for extreme morbid obesity, stage II cervical adenocarcinoma, recurrent endometrial cancer, sp chemoradiation in 2016.  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 Per patient, since June she has been having problem with incontinence.  On June 10, she was at [facility] where a cath was placed.  She had hemorrhagic cystitis with clots leading to obstruction.  She had a urology follow-up, her Foley was removed, initially all was okay.  On July 1, she had a cystoscopy which showed urethrovesical junction oozing. No malignancy or metaplasia.  As she had urinary incontinence and pelvic pain from her radiation as well as her incontinence she was tried on both Gemtesa and Solifenacin without success.  Per patient she had recurrence of bleeding and decreased urine output.  She presented with urinary obstruction and decreased urine output.  UA was suggestive of UTI.  She was started on IV Rocephin.  Urology was consulted.  She underwent suprapubic catheter placement by IR on 01/23/2023.  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  Presented with acute urinary retention.  Urology recommended suprapubic catheter placement.  Underwent suprapubic catheter placement by IR on 01/23/2023. -Follow further urology recommendations  UTI: Present on admission -Continue Rocephin.  Follow urine cultures  AKI -Creatinine has worsened to 2.87.  Start IV fluids.  Renal ultrasound.  Repeat a.m. labs.  Leukocytosis Mild.  Monitor  Thrombocytosis -Resolved  Bacterial vaginosis -Clue  cells seen on wet prep.  Continue Flagyl  History of stage II cervical adenocarcinoma, recurrent endometrial cancer From intermittent.  Outpatient follow-up with oncology/GYN  Hyperlipidemia -Continue statin  Morbid obesity -Outpatient follow-up   DVT prophylaxis: Heparin subcutaneous Code Status: Full Family Communication: Husband at bedside Disposition Plan: Status is: Inpatient Remains inpatient appropriate because: Of severity of illness    Consultants: Urology/IR  Procedures: As above  Antimicrobials:  Anti-infectives (From admission, onward)    Start     Dose/Rate Route Frequency Ordered Stop   01/23/23 2200  cefTRIAXone (ROCEPHIN) 1 g in sodium chloride 0.9 % 100 mL IVPB        1 g 200 mL/hr over 30 Minutes Intravenous Every 24 hours 01/23/23 1837 01/25/23 2159   01/23/23 1000  metroNIDAZOLE (FLAGYL) tablet 500 mg        500 mg Oral Every 12 hours 01/23/23 0413     01/22/23 2215  cefTRIAXone (ROCEPHIN) 2 g in sodium chloride 0.9 % 100 mL IVPB        2 g 200 mL/hr over 30 Minutes Intravenous  Once 01/22/23 2204 01/22/23 2319        Subjective: Patient seen and examined at bedside.  Feels slightly better.  Denies worsening abdominal pain, fever, vomiting.  Objective: Vitals:   01/23/23 2003 01/24/23 0031 01/24/23 0514 01/24/23 0908  BP: (!) 144/57 131/61 (!) 153/57 133/60  Pulse: 77 72 73 74  Resp: 18 14 18 17   Temp: 98.5 F (36.9 C) 99 F (37.2 C) 98.2 F (36.8 C) 98.1 F (36.7 C)  TempSrc: Oral Oral Oral Oral  SpO2: 96% 97% 96% 94%  Weight:      Height:  Intake/Output Summary (Last 24 hours) at 01/24/2023 1203 Last data filed at 01/24/2023 0944 Gross per 24 hour  Intake 400 ml  Output 1630 ml  Net -1230 ml   Filed Weights   01/22/23 1512  Weight: 130.6 kg    Examination:  General exam: Appears calm and comfortable.  On room air. Respiratory system: Bilateral decreased breath sounds at bases with scattered  crackles Cardiovascular system: S1 & S2 heard, Rate controlled Gastrointestinal system: Abdomen is morbidly obese, nondistended, soft and nontender. Normal bowel sounds heard. Extremities: No cyanosis, clubbing; trace lower extremity edema Genitourinary: Suprapubic catheter present Central nervous system: Alert and oriented. No focal neurological deficits. Moving extremities Skin: No rashes, lesions or ulcers Psychiatry: Judgement and insight appear normal. Mood & affect appropriate.     Data Reviewed: I have personally reviewed following labs and imaging studies  CBC: Recent Labs  Lab 01/22/23 1558 01/23/23 0221 01/24/23 0500  WBC 11.0* 12.4* 11.6*  NEUTROABS 8.5* 11.2* 9.5*  HGB 13.9 13.0 11.8*  HCT 41.8 39.2 36.9  MCV 95.0 97.5 100.3*  PLT 445* 374 334   Basic Metabolic Panel: Recent Labs  Lab 01/22/23 1558 01/23/23 0427 01/24/23 0500  NA 139 136 139  K 3.9 4.1 3.5  CL 100 101 101  CO2 30 24 27   GLUCOSE 102* 128* 124*  BUN 16 22 35*  CREATININE 0.58 1.18* 2.87*  CALCIUM 9.7 8.7* 8.5*  MG  --   --  2.2  PHOS  --   --  4.9*   GFR: Estimated Creatinine Clearance: 24.7 mL/min (A) (by C-G formula based on SCr of 2.87 mg/dL (H)). Liver Function Tests: Recent Labs  Lab 01/24/23 0500  ALBUMIN 2.7*   No results for input(s): "LIPASE", "AMYLASE" in the last 168 hours. No results for input(s): "AMMONIA" in the last 168 hours. Coagulation Profile: Recent Labs  Lab 01/23/23 0922  INR 1.0   Cardiac Enzymes: No results for input(s): "CKTOTAL", "CKMB", "CKMBINDEX", "TROPONINI" in the last 168 hours. BNP (last 3 results) No results for input(s): "PROBNP" in the last 8760 hours. HbA1C: No results for input(s): "HGBA1C" in the last 72 hours. CBG: No results for input(s): "GLUCAP" in the last 168 hours. Lipid Profile: No results for input(s): "CHOL", "HDL", "LDLCALC", "TRIG", "CHOLHDL", "LDLDIRECT" in the last 72 hours. Thyroid Function Tests: No results for  input(s): "TSH", "T4TOTAL", "FREET4", "T3FREE", "THYROIDAB" in the last 72 hours. Anemia Panel: No results for input(s): "VITAMINB12", "FOLATE", "FERRITIN", "TIBC", "IRON", "RETICCTPCT" in the last 72 hours. Sepsis Labs: No results for input(s): "PROCALCITON", "LATICACIDVEN" in the last 168 hours.  Recent Results (from the past 240 hour(s))  Wet prep, genital     Status: Abnormal   Collection Time: 01/22/23  5:35 PM  Result Value Ref Range Status   Yeast Wet Prep HPF POC NONE SEEN NONE SEEN Final   Trich, Wet Prep NONE SEEN NONE SEEN Final   Clue Cells Wet Prep HPF POC PRESENT (A) NONE SEEN Final   WBC, Wet Prep HPF POC <10 <10 Final    Comment: Specimen diluted due to transport tube containing more than 1 ml of saline, interpret results with caution.   Sperm NONE SEEN  Final    Comment: Performed at [facility], [address], [place], [place] [address]         Radiology Studies: CT GUIDED SUPERPUBIC CATHETER PLMT  Result Date: 01/23/2023 INDICATION: 67 year old female with history of cervical cancer and bladder outlet obstruction. EXAM: ULTRASOUND AND CT  GUIDED SUPRAPUBIC CATHETER PLACEMENT COMPARISON:  None Available. MEDICATIONS: None ANESTHESIA/SEDATION: Moderate (conscious) sedation was employed during this procedure. A total of Versed 2 mg and Fentanyl 200 mcg was administered intravenously. Moderate Sedation Time: 16 minutes. The patient's level of consciousness and vital signs were monitored continuously by radiology nursing throughout the procedure under my direct supervision. CONTRAST:  None COMPLICATIONS: None immediate. PROCEDURE: The procedure, risks, benefits, and alternatives were explained to the patient. Questions regarding the procedure were encouraged and answered. The patient understands and consents to the procedure. A timeout was performed prior to the initiation of the procedure. The patient was positioned supine on the CT gantry. Preprocedural  imaging was obtained of the lower pelvis. The skin overlying the anterior aspect the lower pelvis was prepped and draped in usual sterile fashion. Under direct ultrasound guidance, a 22 gauge needle was utilized for the purposes of procedural planning after the overlying soft tissues were anesthetized with 1% lidocaine. Appropriate trajectory was confirmed with a limited lower abdominal/pelvic CT. Next, an 18 gauge trocar needle was advanced into the urinary bladder under direct ultrasound guidance. Ultrasound image was saved for procedural documentation purposes. A short Amplatz wire was coiled within the urinary bladder. Appropriate position was confirmed with ultrasound. The track was serially dilated ultimately allowing placement of a 16 French all-purpose drainage catheter with end coiled and locked within the urinary bladder. The catheter was connected to a gravity bag. The exit site of the catheter was secured within interrupted suture. A dressing was placed. The patient tolerated the procedure well without immediate postprocedural complication. FINDINGS: Distended bladder. There is no bowel interposed between the anterior aspect of the urinary bladder and the ventral lower abdominal/pelvic wall. Ultrasound and CT was utilized for the placement of a 16 French percutaneous drainage catheter with end coiled and locked within the urinary bladder. IMPRESSION: Technically successful ultrasound and CT-guided placement of a 16 French suprapubic catheter. PLAN: Recommend fluoroscopic guided exchange of this suprapubic drainage catheter in approximately 6-8 weeks. Marliss Coots, MD [facility] Radiology Electronically Signed   By: Marliss Coots M.D.   On: 01/23/2023 17:06        Scheduled Meds:  amitriptyline  10 mg Oral QHS   atorvastatin  80 mg Oral Daily   Chlorhexidine Gluconate Cloth  6 each Topical Daily   heparin  5,000 Units Subcutaneous Q8H    lidocaine  1 Application Urethral Once   metroNIDAZOLE  500 mg Oral Q12H   sodium chloride flush  10-40 mL Intracatheter Q12H   sodium chloride flush  5 mL Intracatheter Q8H   Continuous Infusions:  cefTRIAXone (ROCEPHIN)  IV 1 g (01/23/23 2119)   dextrose 5 % and 0.9 % NaCl 100 mL/hr at 01/24/23 0859          Glade Lloyd, MD [facility] 01/24/2023, 12:03 PM

## 2023-01-24 NOTE — Plan of Care (Signed)
  Problem: Nutrition: Goal: Adequate nutrition will be maintained Outcome: Progressing   

## 2023-01-24 NOTE — Progress Notes (Signed)
Subjective: Patient reports mild abdominal pain. Creatinine increased to 2.87. Jonne Ply is clear from SP tube. 2.3L urine output  Objective: Vital signs in last 24 hours: Temp:  [98.1 F (36.7 C)-99 F (37.2 C)] 98.3 F (36.8 C) (10/19 1936) Pulse Rate:  [72-92] 92 (10/19 1936) Resp:  [14-18] 16 (10/19 1936) BP: (114-153)/(51-61) 143/56 (10/19 1936) SpO2:  [94 %-99 %] 98 % (10/19 1936)  Intake/Output from previous day: 10/18 0701 - 10/19 0700 In: 400 [P.O.:300; IV Piggyback:100] Out: 780 [Urine:780] Intake/Output this shift: Total I/O In: 120 [P.O.:120] Out: 700 [Urine:700]  Physical Exam:  General:alert, cooperative, and appears stated age GI: soft, non tender, normal bowel sounds, no palpable masses, no organomegaly, no inguinal hernia Female genitalia: not done Extremities: extremities normal, atraumatic, no cyanosis or edema  Lab Results: Recent Labs    01/22/23 1558 01/23/23 0221 01/24/23 0500  HGB 13.9 13.0 11.8*  HCT 41.8 39.2 36.9   BMET Recent Labs    01/23/23 0427 01/24/23 0500  NA 136 139  K 4.1 3.5  CL 101 101  CO2 24 27  GLUCOSE 128* 124*  BUN 22 35*  CREATININE 1.18* 2.87*  CALCIUM 8.7* 8.5*   Recent Labs    01/23/23 0922  INR 1.0   No results for input(s): "LABURIN" in the last 72 hours. Results for orders placed or performed during the hospital encounter of 01/22/23  Wet prep, genital     Status: Abnormal   Collection Time: 01/22/23  5:35 PM  Result Value Ref Range Status   Yeast Wet Prep HPF POC NONE SEEN NONE SEEN Final   Trich, Wet Prep NONE SEEN NONE SEEN Final   Clue Cells Wet Prep HPF POC PRESENT (A) NONE SEEN Final   WBC, Wet Prep HPF POC <10 <10 Final    Comment: Specimen diluted due to transport tube containing more than 1 ml of saline, interpret results with caution.   Sperm NONE SEEN  Final    Comment: Performed at [facility], [address] [place], [place] [address]    Studies/Results: US  RENAL  Result Date: 01/24/2023 CLINICAL DATA:  [id number] AKI (acute kidney injury) (HCC) [id number] EXAM: RENAL / URINARY TRACT ULTRASOUND COMPLETE COMPARISON:  CT 09/16/2022 FINDINGS: Right Kidney: Renal measurements: 10.0 x 5.4 x 6.5 cm = volume: 185 mL. Echogenicity within normal limits. No mass or hydronephrosis visualized. Left Kidney: Renal measurements: 11.9 x 5.3 x 5.6 cm = volume: 185 mL. Echogenicity within normal limits. No mass or hydronephrosis visualized. Bladder: Decompressed by Foley catheter. Other: Sonographer reports a technically difficult exam secondary to poor penetration related to patient body habitus and inability to breath hold. IMPRESSION: No evidence of obstructive uropathy. Electronically Signed   By: Duanne Guess D.O.   On: 01/24/2023 16:57   CT GUIDED SUPERPUBIC CATHETER PLMT  Result Date: 01/23/2023 INDICATION: 67 year old female with history of cervical cancer and bladder outlet obstruction. EXAM: ULTRASOUND AND CT GUIDED SUPRAPUBIC CATHETER PLACEMENT COMPARISON:  None Available. MEDICATIONS: None ANESTHESIA/SEDATION: Moderate (conscious) sedation was employed during this procedure. A total of Versed 2 mg and Fentanyl 200 mcg was administered intravenously. Moderate Sedation Time: 16 minutes. The patient's level of consciousness and vital signs were monitored continuously by radiology nursing throughout the procedure under my direct supervision. CONTRAST:  None COMPLICATIONS: None immediate. PROCEDURE: The procedure, risks, benefits, and alternatives were explained to the patient. Questions regarding the procedure were encouraged and answered. The patient understands and consents to the procedure. A timeout was performed  prior to the initiation of the procedure. The patient was positioned supine on the CT gantry. Preprocedural imaging was obtained of the lower pelvis. The skin overlying the anterior aspect the lower pelvis was prepped and draped in usual sterile fashion. Under  direct ultrasound guidance, a 22 gauge needle was utilized for the purposes of procedural planning after the overlying soft tissues were anesthetized with 1% lidocaine. Appropriate trajectory was confirmed with a limited lower abdominal/pelvic CT. Next, an 18 gauge trocar needle was advanced into the urinary bladder under direct ultrasound guidance. Ultrasound image was saved for procedural documentation purposes. A short Amplatz wire was coiled within the urinary bladder. Appropriate position was confirmed with ultrasound. The track was serially dilated ultimately allowing placement of a 16 French all-purpose drainage catheter with end coiled and locked within the urinary bladder. The catheter was connected to a gravity bag. The exit site of the catheter was secured within interrupted suture. A dressing was placed. The patient tolerated the procedure well without immediate postprocedural complication. FINDINGS: Distended bladder. There is no bowel interposed between the anterior aspect of the urinary bladder and the ventral lower abdominal/pelvic wall. Ultrasound and CT was utilized for the placement of a 16 French percutaneous drainage catheter with end coiled and locked within the urinary bladder. IMPRESSION: Technically successful ultrasound and CT-guided placement of a 16 French suprapubic catheter. PLAN: Recommend fluoroscopic guided exchange of this suprapubic drainage catheter in approximately 6-8 weeks. Marliss Coots, MD [facility] Electronically Signed   By: Marliss Coots M.D.   On: 01/23/2023 17:06    Assessment/Plan: 67yo with urinary retention, ARF, bilateral hydronephrosis Urinary retention: Please continue SP tube to straight drain. Patient patient ARF is likely to due obstruction and should improve in the next 24-48 hours. Her Urine output has increased significantly since SP tube placement Bilateral hydronephrosis: The patients  hydronephrosis is likely related to urinary retention and poor bladder compliance. Please obtain renal US in 48 hours to ensure resolution   LOS: 2 days   Wilkie Aye 01/24/2023, 10:56 PM

## 2023-01-25 DIAGNOSIS — R339 Retention of urine, unspecified: Secondary | ICD-10-CM | POA: Diagnosis not present

## 2023-01-25 LAB — CBC WITH DIFFERENTIAL/PLATELET
Abs Immature Granulocytes: 0.04 10*3/uL (ref 0.00–0.07)
Basophils Absolute: 0 10*3/uL (ref 0.0–0.1)
Basophils Relative: 1 %
Eosinophils Absolute: 0.2 10*3/uL (ref 0.0–0.5)
Eosinophils Relative: 2 %
HCT: 36.2 % (ref 36.0–46.0)
Hemoglobin: 11.7 g/dL — ABNORMAL LOW (ref 12.0–15.0)
Immature Granulocytes: 1 %
Lymphocytes Relative: 11 %
Lymphs Abs: 1 10*3/uL (ref 0.7–4.0)
MCH: 32.7 pg (ref 26.0–34.0)
MCHC: 32.3 g/dL (ref 30.0–36.0)
MCV: 101.1 fL — ABNORMAL HIGH (ref 80.0–100.0)
Monocytes Absolute: 0.9 10*3/uL (ref 0.1–1.0)
Monocytes Relative: 10 %
Neutro Abs: 6.7 10*3/uL (ref 1.7–7.7)
Neutrophils Relative %: 75 %
Platelets: 293 10*3/uL (ref 150–400)
RBC: 3.58 MIL/uL — ABNORMAL LOW (ref 3.87–5.11)
RDW: 14.1 % (ref 11.5–15.5)
WBC: 8.8 10*3/uL (ref 4.0–10.5)
nRBC: 0 % (ref 0.0–0.2)

## 2023-01-25 LAB — BASIC METABOLIC PANEL
Anion gap: 9 (ref 5–15)
BUN: 16 mg/dL (ref 8–23)
CO2: 28 mmol/L (ref 22–32)
Calcium: 8.1 mg/dL — ABNORMAL LOW (ref 8.9–10.3)
Chloride: 102 mmol/L (ref 98–111)
Creatinine, Ser: 0.8 mg/dL (ref 0.44–1.00)
GFR, Estimated: >60 mL/min (ref >60)
Glucose, Bld: 127 mg/dL — ABNORMAL HIGH (ref 70–99)
Potassium: 2.7 mmol/L — CL (ref 3.5–5.1)
Sodium: 139 mmol/L (ref 135–145)

## 2023-01-25 LAB — URINE CULTURE: Culture: NO GROWTH

## 2023-01-25 LAB — MAGNESIUM: Magnesium: 1.9 mg/dL (ref 1.7–2.4)

## 2023-01-25 MED ORDER — POTASSIUM CHLORIDE CRYS ER 20 MEQ PO TBCR
40.0000 meq | EXTENDED_RELEASE_TABLET | Freq: Once | ORAL | Status: AC
  Administered 2023-01-25: 40 meq via ORAL
  Filled 2023-01-25: qty 2

## 2023-01-25 MED ORDER — METRONIDAZOLE 500 MG PO TABS: 500.0000 mg | ORAL_TABLET | Freq: Two times a day (BID) | ORAL | 0 refills | Status: AC

## 2023-01-25 MED ORDER — HEPARIN SOD (PORK) LOCK FLUSH 100 UNIT/ML IV SOLN
500.0000 [IU] | INTRAVENOUS | Status: AC | PRN
  Administered 2023-01-25: 500 [IU]

## 2023-01-25 MED ORDER — SODIUM CHLORIDE 0.9 % IV SOLN
2.0000 g | INTRAVENOUS | Status: DC
  Filled 2023-01-25: qty 20

## 2023-01-25 MED ORDER — CEPHALEXIN 500 MG PO CAPS: 500.0000 mg | ORAL_CAPSULE | Freq: Three times a day (TID) | ORAL | 0 refills | Status: AC

## 2023-01-25 MED ORDER — POTASSIUM CHLORIDE 10 MEQ/100ML IV SOLN
10.0000 meq | INTRAVENOUS | Status: DC
  Administered 2023-01-25 (×2): 10 meq via INTRAVENOUS
  Filled 2023-01-25 (×4): qty 100

## 2023-01-25 NOTE — Plan of Care (Signed)

## 2023-01-25 NOTE — Progress Notes (Signed)
Reviewed written d/c instructions w pt and her husband, all questions answered, they both verbalized understanding. Instructed both of how to empy drainage bag and daily dressing change of s/p site. They both verbalized good understanding. Gave pt split guaze and roll of tape. D/C per w/c w all belongings in stable condition.

## 2023-01-25 NOTE — Discharge Summary (Signed)
Physician Discharge Summary  Brandy Hardy HKV:[id number] DOB: [birth year] DOA: 01/22/2023  PCP: Irven Coe, MD  Admit date: 01/22/2023 Discharge date: 01/25/2023  Admitted From: Home Disposition: Home  Recommendations for Outpatient Follow-up:  Follow up with PCP in 1 week with repeat CBC/BMP Outpatient follow-up with urology and IR. Follow up in ED if symptoms worsen or new appear   Home Health: No Equipment/Devices: Supra catheter placed on 01/23/2023 by IR Discharge Condition: Stable CODE STATUS: Full Diet recommendation: Heart healthy  Brief/Interim Summary: 67 year old female with past medical history significant for extreme morbid obesity, stage II cervical adenocarcinoma, recurrent endometrial cancer, sp chemoradiation in 2016.  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 Per patient, since June she has been having problem with incontinence.  On June 10, she was at [facility] where a cath was placed.  She had hemorrhagic cystitis with clots leading to obstruction.  She had a urology follow-up, her Foley was removed, initially all was okay.  On July 1, she had a cystoscopy which showed urethrovesical junction oozing. No malignancy or metaplasia.  As she had urinary incontinence and pelvic pain from her radiation as well as her incontinence she was tried on both Gemtesa and Solifenacin without success.  Per patient she had recurrence of bleeding and decreased urine output.  She presented with urinary obstruction and decreased urine output.  UA was suggestive of UTI.  She was started on IV Rocephin.  Urology was consulted.  She underwent suprapubic catheter placement by IR on 01/23/2023.  She had acute kidney injury as well requiring IV fluids.  Subsequently, her condition has improved.  Her suprapubic catheter is functioning well.  Her  kidney function has improved.  Urology has cleared the patient for discharge.  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  Presented with acute urinary retention.  Urology recommended suprapubic catheter placement.  Underwent suprapubic catheter placement by IR on 01/23/2023. -Subsequently, her condition has improved.  Her suprapubic catheter is functioning well.  Her kidney function has improved.  Urology has cleared the patient for discharge.  Discharge plan home today with outpatient follow-up with PCP and urology and IR currently on   UTI: Present on admission --Currently on Rocephin.  Urine culture negative so far.  Discharge home on few days of oral Keflex.  AKI -Creatinine worsened to 2.87.  Treated with IV fluids.  Creatinine has normalized.  Renal ultrasound did not show hydronephrosis.  Outpatient follow-up.   Leukocytosis -Resolved   Thrombocytosis -Resolved   Bacterial vaginosis -Clue cells seen on wet prep.  Continue Flagyl and complete total 7-day course of therapy   History of stage II cervical adenocarcinoma, recurrent endometrial cancer -Outpatient follow-up with oncology/GYN   Hyperlipidemia -Apparently does not take statin at home recently.  Outpatient follow-up with PCP.    Morbid obesity -Outpatient follow-up  Discharge Instructions  Discharge Instructions     Diet - low sodium heart healthy   Complete by: As directed    Increase activity slowly   Complete by: As directed    No wound care   Complete by: As directed       Allergies as of 01/25/2023   No Known Allergies      Medication List  STOP taking these medications    atorvastatin 40 MG tablet Commonly known as: LIPITOR   Culturelle Digestive Health Caps   estradiol 0.1 MG/GM vaginal cream Commonly  known as: ESTRACE   Gemtesa 75 MG Tabs Generic drug: Vibegron   phenazopyridine 95 MG tablet Commonly known as: PYRIDIUM   PROBIOTIC DAILY PO   solifenacin 5 MG tablet Commonly known as: VESICARE       TAKE these medications    acetaminophen 325 MG tablet Commonly known as: TYLENOL Take 650 mg by mouth every 6 (six) hours as needed for mild pain (pain score 1-3) or moderate pain (pain score 4-6).   amitriptyline 10 MG tablet Commonly known as: ELAVIL Take 10 mg by mouth at bedtime.   cephALEXin 500 MG capsule Commonly known as: KEFLEX Take 1 capsule (500 mg total) by mouth 3 (three) times daily for 4 days.   ibuprofen 200 MG tablet Commonly known as: ADVIL Take 200 mg by mouth every 6 (six) hours as needed for mild pain (pain score 1-3) or moderate pain (pain score 4-6).   metroNIDAZOLE 500 MG tablet Commonly known as: FLAGYL Take 1 tablet (500 mg total) by mouth every 12 (twelve) hours for 5 days.   multivitamin with minerals Tabs tablet Take 1 tablet by mouth daily.   Premarin vaginal cream Generic drug: conjugated estrogens Place 1 application vaginally as needed.   Vitamin D (Ergocalciferol) 1.25 MG (50000 UNIT) Caps capsule Commonly known as: DRISDOL TAKE 1 CAPSULE WEEKLY What changed:  how to take this when to take this additional instructions        Follow-up Information     Irven Coe, MD. Schedule an appointment as soon as possible for a visit in 1 week(s).   Specialty: Family Medicine Why: With repeat BMP Contact information: [address] [place] [address] [phone]         Jerilee Field, MD. Schedule an appointment as soon as possible for a visit in 1 week(s).   Specialty: Urology Contact information: [address] [place] [address] [phone]                No Known Allergies  Consultations: Urology/IR   Procedures/Studies: US RENAL  Result Date: 01/24/2023 CLINICAL DATA:   [id number] AKI (acute kidney injury) (HCC) [id number] EXAM: RENAL / URINARY TRACT ULTRASOUND COMPLETE COMPARISON:  CT 09/16/2022 FINDINGS: Right Kidney: Renal measurements: 10.0 x 5.4 x 6.5 cm = volume: 185 mL. Echogenicity within normal limits. No mass or hydronephrosis visualized. Left Kidney: Renal measurements: 11.9 x 5.3 x 5.6 cm = volume: 185 mL. Echogenicity within normal limits. No mass or hydronephrosis visualized. Bladder: Decompressed by Foley catheter. Other: Sonographer reports a technically difficult exam secondary to poor penetration related to patient body habitus and inability to breath hold. IMPRESSION: No evidence of obstructive uropathy. Electronically Signed   By: Duanne Guess D.O.   On: 01/24/2023 16:57   CT GUIDED SUPERPUBIC CATHETER PLMT  Result Date: 01/23/2023 INDICATION: 67 year old female with history of cervical cancer and bladder outlet obstruction. EXAM: ULTRASOUND AND CT GUIDED SUPRAPUBIC CATHETER PLACEMENT COMPARISON:  None Available. MEDICATIONS: None ANESTHESIA/SEDATION: Moderate (conscious) sedation was employed during this procedure. A total of Versed 2 mg and Fentanyl 200 mcg was administered intravenously. Moderate Sedation Time: 16 minutes. The patient's level of consciousness and vital signs were monitored continuously by radiology nursing throughout the procedure under my direct supervision. CONTRAST:  None COMPLICATIONS: None immediate. PROCEDURE: The procedure, risks, benefits, and  alternatives were explained to the patient. Questions regarding the procedure were encouraged and answered. The patient understands and consents to the procedure. A timeout was performed prior to the initiation of the procedure. The patient was positioned supine on the CT gantry. Preprocedural imaging was obtained of the lower pelvis. The skin overlying the anterior aspect the lower pelvis was prepped and draped in usual sterile fashion. Under direct ultrasound guidance, a 22 gauge needle was  utilized for the purposes of procedural planning after the overlying soft tissues were anesthetized with 1% lidocaine. Appropriate trajectory was confirmed with a limited lower abdominal/pelvic CT. Next, an 18 gauge trocar needle was advanced into the urinary bladder under direct ultrasound guidance. Ultrasound image was saved for procedural documentation purposes. A short Amplatz wire was coiled within the urinary bladder. Appropriate position was confirmed with ultrasound. The track was serially dilated ultimately allowing placement of a 16 French all-purpose drainage catheter with end coiled and locked within the urinary bladder. The catheter was connected to a gravity bag. The exit site of the catheter was secured within interrupted suture. A dressing was placed. The patient tolerated the procedure well without immediate postprocedural complication. FINDINGS: Distended bladder. There is no bowel interposed between the anterior aspect of the urinary bladder and the ventral lower abdominal/pelvic wall. Ultrasound and CT was utilized for the placement of a 16 French percutaneous drainage catheter with end coiled and locked within the urinary bladder. IMPRESSION: Technically successful ultrasound and CT-guided placement of a 16 French suprapubic catheter. PLAN: Recommend fluoroscopic guided exchange of this suprapubic drainage catheter in approximately 6-8 weeks. Marliss Coots, MD [facility]iology Electronically Signed   By: Marliss Coots M.D.   On: 01/23/2023 17:06      Subjective: Patient seen and examined at bedside.  Feels okay to go home today.  Denies worsening abdominal pain, fever, nausea or vomiting.  Discharge Exam: Vitals:   01/24/23 1936 01/25/23 0529  BP: (!) 143/56 135/61  Pulse: 92 74  Resp: 16 18  Temp: 98.3 F (36.8 C) 98.7 F (37.1 C)  SpO2: 98% 95%    General: Pt is alert, awake, not in acute distress.  On room  air. Cardiovascular: rate controlled, S1/S2 + Respiratory: bilateral decreased breath sounds at bases Abdominal: Soft, morbidly obese, NT, ND, bowel sounds +.  Suprapubic catheter present. Extremities: Trace lower extremity edema; no cyanosis    The results of significant diagnostics from this hospitalization (including imaging, microbiology, ancillary and laboratory) are listed below for reference.     Microbiology: Recent Results (from the past 240 hour(s))  Wet prep, genital     Status: Abnormal   Collection Time: 01/22/23  5:35 PM  Result Value Ref Range Status   Yeast Wet Prep HPF POC NONE SEEN NONE SEEN Final   Trich, Wet Prep NONE SEEN NONE SEEN Final   Clue Cells Wet Prep HPF POC PRESENT (A) NONE SEEN Final   WBC, Wet Prep HPF POC <10 <10 Final    Comment: Specimen diluted due to transport tube containing more than 1 ml of saline, interpret results with caution.   Sperm NONE SEEN  Final    Comment: Performed at [facility], [address], [place], [place] [address]  Urine Culture     Status: None   Collection Time: 01/23/23  7:30 PM   Specimen: Urine, Clean Catch  Result Value Ref Range Status   Specimen Description   Final    URINE, CLEAN  CATCH Performed at [facility], [address] [place], [place] [address]    Special Requests   Final    NONE Performed at [facility], [address], [place], [place] [address]    Culture   Final    NO GROWTH Performed at [facility], [address], [place], [place] [address]    Report Status 01/25/2023 FINAL  Final     Labs: BNP (last 3 results) No results for input(s): "BNP" in the last 8760 hours. Basic Metabolic Panel: Recent Labs  Lab 01/22/23 1558 01/23/23 0427 01/24/23 0500 01/25/23 0250  NA 139 136 139 139  K 3.9 4.1 3.5 2.7*  CL 100 101 101 102  CO2 30 24 27 28   GLUCOSE 102* 128* 124* 127*  BUN 16 22 35* 16  CREATININE 0.58 1.18* 2.87*  0.80  CALCIUM 9.7 8.7* 8.5* 8.1*  MG  --   --  2.2 1.9  PHOS  --   --  4.9*  --    Liver Function Tests: Recent Labs  Lab 01/24/23 0500  ALBUMIN 2.7*   No results for input(s): "LIPASE", "AMYLASE" in the last 168 hours. No results for input(s): "AMMONIA" in the last 168 hours. CBC: Recent Labs  Lab 01/22/23 1558 01/23/23 0221 01/24/23 0500 01/25/23 0250  WBC 11.0* 12.4* 11.6* 8.8  NEUTROABS 8.5* 11.2* 9.5* 6.7  HGB 13.9 13.0 11.8* 11.7*  HCT 41.8 39.2 36.9 36.2  MCV 95.0 97.5 100.3* 101.1*  PLT 445* 374 334 293   Cardiac Enzymes: No results for input(s): "CKTOTAL", "CKMB", "CKMBINDEX", "TROPONINI" in the last 168 hours. BNP: Invalid input(s): "POCBNP" CBG: No results for input(s): "GLUCAP" in the last 168 hours. D-Dimer No results for input(s): "DDIMER" in the last 72 hours. Hgb A1c No results for input(s): "HGBA1C" in the last 72 hours. Lipid Profile No results for input(s): "CHOL", "HDL", "LDLCALC", "TRIG", "CHOLHDL", "LDLDIRECT" in the last 72 hours. Thyroid function studies No results for input(s): "TSH", "T4TOTAL", "T3FREE", "THYROIDAB" in the last 72 hours.  Invalid input(s): "FREET3" Anemia work up No results for input(s): "VITAMINB12", "FOLATE", "FERRITIN", "TIBC", "IRON", "RETICCTPCT" in the last 72 hours. Urinalysis    Component Value Date/Time   COLORURINE YELLOW 01/23/2023 1930   APPEARANCEUR CLEAR 01/23/2023 1930   LABSPEC 1.009 01/23/2023 1930   PHURINE 6.0 01/23/2023 1930   GLUCOSEU NEGATIVE 01/23/2023 1930   GLUCOSEU NEGATIVE 02/21/2013 0830   HGBUR LARGE (A) 01/23/2023 1930   BILIRUBINUR NEGATIVE 01/23/2023 1930   BILIRUBINUR negative 10/11/2022 1155   KETONESUR NEGATIVE 01/23/2023 1930   PROTEINUR NEGATIVE 01/23/2023 1930   UROBILINOGEN 1.0 10/11/2022 1155   UROBILINOGEN 0.2 02/21/2013 0830   NITRITE NEGATIVE 01/23/2023 1930   LEUKOCYTESUR NEGATIVE 01/23/2023 1930   Sepsis Labs Recent Labs  Lab 01/22/23 1558 01/23/23 0221  01/24/23 0500 01/25/23 0250  WBC 11.0* 12.4* 11.6* 8.8   Microbiology Recent Results (from the past 240 hour(s))  Wet prep, genital     Status: Abnormal   Collection Time: 01/22/23  5:35 PM  Result Value Ref Range Status   Yeast Wet Prep HPF POC NONE SEEN NONE SEEN Final   Trich, Wet Prep NONE SEEN NONE SEEN Final   Clue Cells Wet Prep HPF POC PRESENT (A) NONE SEEN Final   WBC, Wet Prep HPF POC <10 <10 Final    Comment: Specimen diluted due to transport tube containing more than 1 ml of saline, interpret results with caution.   Sperm NONE SEEN  Final    Comment: Performed at [facility], [address] [place], [place] [address]  Urine Culture     Status: None   Collection Time: 01/23/23  7:30 PM   Specimen: Urine, Clean Catch  Result Value Ref Range Status   Specimen Description   Final    URINE, CLEAN CATCH Performed at [facility], [address] [place], [place] [address]    Special Requests   Final    NONE Performed at [facility], [address] [place], [place] [address]    Culture   Final    NO GROWTH Performed at [facility], [address] [place], [place] [address]    Report Status 01/25/2023 FINAL  Final     Time coordinating discharge: 35 minutes  SIGNED:   Glade Lloyd, MD  [facility] 01/25/2023, 11:28 AM

## 2023-01-25 NOTE — Progress Notes (Signed)
NP Virgel Manifold was notified of critical lab, potassium is 2.7

## 2023-01-26 ENCOUNTER — Ambulatory Visit: Payer: Medicare Other

## 2023-01-28 ENCOUNTER — Other Ambulatory Visit: Payer: Self-pay | Admitting: Urology

## 2023-01-28 ENCOUNTER — Other Ambulatory Visit (HOSPITAL_COMMUNITY): Payer: Self-pay | Admitting: Urology

## 2023-01-28 DIAGNOSIS — R339 Retention of urine, unspecified: Secondary | ICD-10-CM

## 2023-02-07 ENCOUNTER — Other Ambulatory Visit: Payer: Self-pay

## 2023-02-07 ENCOUNTER — Encounter (HOSPITAL_COMMUNITY): Payer: Self-pay

## 2023-02-07 ENCOUNTER — Emergency Department (HOSPITAL_COMMUNITY)
Admission: EM | Admit: 2023-02-07 | Discharge: 2023-02-07 | Disposition: A | Discharge status: Home / Self Care | Payer: Medicare Other | Attending: Emergency Medicine | Admitting: Emergency Medicine

## 2023-02-07 DIAGNOSIS — T83091A Other mechanical complication of indwelling urethral catheter, initial encounter: Secondary | ICD-10-CM

## 2023-02-07 DIAGNOSIS — Z8541 Personal history of malignant neoplasm of cervix uteri: Secondary | ICD-10-CM | POA: Diagnosis not present

## 2023-02-07 DIAGNOSIS — R339 Retention of urine, unspecified: Secondary | ICD-10-CM | POA: Diagnosis not present

## 2023-02-07 DIAGNOSIS — T83098A Other mechanical complication of other indwelling urethral catheter, initial encounter: Secondary | ICD-10-CM | POA: Insufficient documentation

## 2023-02-07 DIAGNOSIS — R1030 Lower abdominal pain, unspecified: Secondary | ICD-10-CM | POA: Insufficient documentation

## 2023-02-07 NOTE — ED Triage Notes (Signed)
Patient had a suprapubic catheter placed 10/18. Yesterday stated there were blood clots that caused the bag to get blocked up. Had it cleared out. Now it is blocked again. Last drained the bag this morning at 6am. at 12pm today. Having generalized abdominal pain with lower back pain.

## 2023-02-07 NOTE — Discharge Instructions (Signed)
You have been evaluated for your symptoms.  Fortunately your Foley was irrigated and a clot was evacuated.  You may follow-up with a urologist for further care.  Return if you have any concern.

## 2023-02-07 NOTE — ED Provider Notes (Signed)
[facility] EMERGENCY DEPARTMENT AT [facility] Provider Note   CSN: [id number] Arrival date & time: 02/07/23  1447     History  Chief Complaint  Patient presents with   Suprapubic Catheter Problem    Brandy Hardy is a 67 y.o. female.  The history is provided by the patient, medical records and the spouse. No language interpreter was used.     67 year old female significant history of cervical cancer, history of recent urinary retention and had a suprapubic catheter placed on 10/18 presenting with concern of clogged catheter.  Patient report for the past 2 days she has had quite a bit of pressure to her lower abdomen and unable to urinate.  She was seen at [facility] urology yesterday and had her Foley catheter flushed due to having blood clots.  They were able to reestablish urine flow and she went home however last night she noted some mild pressure and today she endorsed increasing suprapubic pain and unable to drain her urine into the Foley bag.  Pain is sharp pressure persistent and moderate to severe.  No nausea vomiting no fever.  She is not on any blood thinner medication  Home Medications Prior to Admission medications   Medication Sig Start Date End Date Taking? Authorizing Provider  acetaminophen (TYLENOL) 325 MG tablet Take 650 mg by mouth every 6 (six) hours as needed for mild pain (pain score 1-3) or moderate pain (pain score 4-6).    [provider]  amitriptyline (ELAVIL) 10 MG tablet Take 10 mg by mouth at bedtime.    [provider]  ibuprofen (ADVIL) 200 MG tablet Take 200 mg by mouth every 6 (six) hours as needed for mild pain (pain score 1-3) or moderate pain (pain score 4-6).    [provider]  Multiple Vitamin (MULTIVITAMIN WITH MINERALS) TABS tablet Take 1 tablet by mouth daily. 03/06/16   Love, Evlyn Kanner, PA-C  PREMARIN vaginal cream Place 1 application vaginally as needed. Patient not taking: Reported on 01/22/2023 04/30/17    [provider]  Vitamin D, Ergocalciferol, (DRISDOL) 50000 units CAPS capsule TAKE 1 CAPSULE WEEKLY Patient taking differently: 50,000 Units See admin instructions. Twice weekly 05/19/16   Reather Littler, MD      Allergies    Patient has no known allergies.    Review of Systems   Review of Systems  All other systems reviewed and are negative.   Physical Exam Updated Vital Signs BP (!) 167/68 (BP Location: Left Arm)   Pulse 72   Temp (!) 97.4 F (36.3 C) (Oral)   Resp 20   Ht 5\' 2"  (1.575 m)   Wt 130.6 kg   SpO2 97%   BMI 52.68 kg/m  Physical Exam Vitals and nursing note reviewed.  Constitutional:      General: She is not in acute distress.    Appearance: She is well-developed. She is obese.     Comments: Patient laying bed appears uncomfortable actively moaning  HENT:     Head: Atraumatic.  Eyes:     Conjunctiva/sclera: Conjunctivae normal.  Cardiovascular:     Rate and Rhythm: Normal rate and regular rhythm.     Pulses: Normal pulses.     Heart sounds: Normal heart sounds.  Pulmonary:     Effort: Pulmonary effort is normal.  Abdominal:     Tenderness: There is abdominal tenderness (Suprapubic catheter in place, abdomen is tender to palpation bowel sounds present.).  Musculoskeletal:     Cervical back: Neck  supple.  Skin:    Findings: No rash.  Neurological:     Mental Status: She is alert.  Psychiatric:        Mood and Affect: Mood normal.     ED Results / Procedures / Treatments   Labs (all labs ordered are listed, but only abnormal results are displayed) Labs Reviewed - No data to display  EKG None  Radiology No results found.  Procedures Procedures    Medications Ordered in ED Medications - No data to display  ED Course/ Medical Decision Making/ A&P                                 Medical Decision Making  BP (!) 167/68 (BP Location: Left Arm)   Pulse 72   Temp (!) 97.4 F (36.3 C) (Oral)   Resp 20   Ht 5\' 2"  (1.575 m)   Wt  130.6 kg   SpO2 97%   BMI 52.68 kg/m   56:29 PM  67 year old female significant history of cervical cancer, history of recent urinary retention and had a suprapubic catheter placed on 10/18 presenting with concern of clogged catheter.  Patient report for the past 2 days she has had quite a bit of pressure to her lower abdomen and unable to urinate.  She was seen at [facility] urology yesterday and had her Foley catheter flushed due to having blood clots.  They were able to reestablish urine flow and she went home however last night she noted some mild pressure and today she endorsed increasing suprapubic pain and unable to drain her urine into the Foley bag.  Pain is sharp pressure persistent and moderate to severe.  No nausea vomiting no fever.  She is not on any blood thinner medication  On exam, patient is laying bed appears uncomfortable, moaning.  She has tenderness to her suprapubic region.  Suprapubic catheter is in place, minimal urine noted in Foley bag.  Nurse was able to successfully irrigate the Foley and was able to evacuate a small clot.  Afterward, we were able to drain urine from the suprapubic catheter and patient endorsed steady improvement of her symptoms.  On reassessment patient appears to be much more comfortable, normal color urine draining into her Foley bag.  At this time I felt patient is stable to be discharged home and she can follow-up with her urologist for further care.  Return precaution given.  Low suspicion for UTI.        Final Clinical Impression(s) / ED Diagnoses Final diagnoses:  Complication, blocked Foley catheter, initial encounter [facility])    Rx / DC Orders ED Discharge Orders     None         Fayrene Helper, PA-C 02/07/23 1608    Bethann Berkshire, MD 02/10/23 1114

## 2023-02-07 NOTE — ED Notes (Signed)
Catheter irrigated. Small clot removed 1000 drained initially, Continues to drain in bag.

## 2023-02-12 ENCOUNTER — Other Ambulatory Visit: Payer: Self-pay | Admitting: Physician Assistant

## 2023-02-12 ENCOUNTER — Encounter (HOSPITAL_COMMUNITY): Payer: Self-pay | Admitting: Physician Assistant

## 2023-02-12 ENCOUNTER — Ambulatory Visit: Payer: Medicare Other

## 2023-02-12 DIAGNOSIS — Z8542 Personal history of malignant neoplasm of other parts of uterus: Secondary | ICD-10-CM

## 2023-02-12 DIAGNOSIS — L0291 Cutaneous abscess, unspecified: Secondary | ICD-10-CM

## 2023-02-13 ENCOUNTER — Ambulatory Visit (HOSPITAL_COMMUNITY)
Admission: RE | Admit: 2023-02-13 | Discharge: 2023-02-13 | Disposition: A | Discharge status: Home / Self Care | Payer: Medicare Other | Source: Ambulatory Visit | Attending: Physician Assistant | Admitting: Physician Assistant

## 2023-02-13 DIAGNOSIS — Z8542 Personal history of malignant neoplasm of other parts of uterus: Secondary | ICD-10-CM | POA: Diagnosis present

## 2023-02-13 DIAGNOSIS — L0291 Cutaneous abscess, unspecified: Secondary | ICD-10-CM

## 2023-02-13 MED ORDER — IOHEXOL 300 MG/ML  SOLN
100.0000 mL | Freq: Once | INTRAMUSCULAR | Status: AC | PRN
  Administered 2023-02-13: 100 mL via INTRAVENOUS

## 2023-02-13 MED ORDER — HEPARIN SOD (PORK) LOCK FLUSH 100 UNIT/ML IV SOLN
500.0000 [IU] | Freq: Once | INTRAVENOUS | Status: AC
  Administered 2023-02-13: 500 [IU] via INTRAVENOUS

## 2023-02-23 ENCOUNTER — Other Ambulatory Visit (HOSPITAL_COMMUNITY): Payer: Self-pay | Admitting: Interventional Radiology

## 2023-02-23 ENCOUNTER — Ambulatory Visit (HOSPITAL_COMMUNITY)
Admission: RE | Admit: 2023-02-23 | Discharge: 2023-02-23 | Disposition: A | Discharge status: Home / Self Care | Payer: Medicare Other | Source: Ambulatory Visit | Attending: Family Medicine | Admitting: Family Medicine

## 2023-02-23 ENCOUNTER — Other Ambulatory Visit: Payer: Self-pay

## 2023-02-23 ENCOUNTER — Encounter (HOSPITAL_COMMUNITY): Payer: Self-pay

## 2023-02-23 ENCOUNTER — Ambulatory Visit (HOSPITAL_COMMUNITY)
Admission: RE | Admit: 2023-02-23 | Discharge: 2023-02-23 | Disposition: A | Discharge status: Home / Self Care | Payer: Medicare Other | Source: Ambulatory Visit | Attending: Urology | Admitting: Urology

## 2023-02-23 ENCOUNTER — Other Ambulatory Visit (HOSPITAL_COMMUNITY): Payer: Self-pay | Admitting: Urology

## 2023-02-23 DIAGNOSIS — Z8619 Personal history of other infectious and parasitic diseases: Secondary | ICD-10-CM | POA: Insufficient documentation

## 2023-02-23 DIAGNOSIS — Z6841 Body Mass Index (BMI) 40.0 and over, adult: Secondary | ICD-10-CM | POA: Diagnosis not present

## 2023-02-23 DIAGNOSIS — R339 Retention of urine, unspecified: Secondary | ICD-10-CM

## 2023-02-23 HISTORY — PX: IR CATHETER TUBE CHANGE: IMG717

## 2023-02-23 HISTORY — PX: IR CV LINE INJECTION: IMG2294

## 2023-02-23 LAB — BASIC METABOLIC PANEL
Anion gap: 9 (ref 5–15)
BUN: 14 mg/dL (ref 8–23)
CO2: 28 mmol/L (ref 22–32)
Calcium: 8.8 mg/dL — ABNORMAL LOW (ref 8.9–10.3)
Chloride: 100 mmol/L (ref 98–111)
Creatinine, Ser: 0.65 mg/dL (ref 0.44–1.00)
GFR, Estimated: >60 mL/min (ref >60)
Glucose, Bld: 95 mg/dL (ref 70–99)
Potassium: 4 mmol/L (ref 3.5–5.1)
Sodium: 137 mmol/L (ref 135–145)

## 2023-02-23 MED ORDER — LIDOCAINE HCL 1 % IJ SOLN: 10.0000 mL | Freq: Once | INTRAMUSCULAR | Status: DC

## 2023-02-23 MED ORDER — FENTANYL CITRATE (PF) 100 MCG/2ML IJ SOLN
INTRAMUSCULAR | Status: AC
  Filled 2023-02-23: qty 2

## 2023-02-23 MED ORDER — LIDOCAINE-EPINEPHRINE 1 %-1:100000 IJ SOLN
20.0000 mL | Freq: Once | INTRAMUSCULAR | Status: AC
  Administered 2023-02-23: 10 mL via INTRADERMAL

## 2023-02-23 MED ORDER — SODIUM CHLORIDE 0.9% FLUSH: 5.0000 mL | Freq: Three times a day (TID) | INTRAVENOUS | Status: DC

## 2023-02-23 MED ORDER — OXYCODONE HCL 5 MG PO TABS: 10.0000 mg | ORAL_TABLET | Freq: Four times a day (QID) | ORAL | Status: DC | PRN

## 2023-02-23 MED ORDER — IOHEXOL 300 MG/ML  SOLN
50.0000 mL | Freq: Once | INTRAMUSCULAR | Status: AC | PRN
  Administered 2023-02-23: 40 mL

## 2023-02-23 MED ORDER — LIDOCAINE HCL 1 % IJ SOLN
INTRAMUSCULAR | Status: AC
  Filled 2023-02-23: qty 20

## 2023-02-23 MED ORDER — HEPARIN SOD (PORK) LOCK FLUSH 100 UNIT/ML IV SOLN
500.0000 [IU] | Freq: Once | INTRAVENOUS | Status: AC
  Administered 2023-02-23: 500 [IU] via INTRAVENOUS
  Filled 2023-02-23: qty 5

## 2023-02-23 MED ORDER — HYDROMORPHONE HCL 1 MG/ML IJ SOLN
INTRAMUSCULAR | Status: AC | PRN
  Administered 2023-02-23: 1 mg via INTRAVENOUS

## 2023-02-23 MED ORDER — FENTANYL CITRATE (PF) 100 MCG/2ML IJ SOLN
INTRAMUSCULAR | Status: AC | PRN
  Administered 2023-02-23 (×2): 50 ug via INTRAVENOUS

## 2023-02-23 MED ORDER — SODIUM CHLORIDE 0.9% FLUSH
10.0000 mL | INTRAVENOUS | Status: DC | PRN
Start: 2023-02-23 — End: 2023-02-24

## 2023-02-23 MED ORDER — HYDROMORPHONE HCL 1 MG/ML IJ SOLN
INTRAMUSCULAR | Status: AC
  Filled 2023-02-23: qty 1

## 2023-02-23 MED ORDER — MIDAZOLAM HCL 2 MG/2ML IJ SOLN
INTRAMUSCULAR | Status: AC | PRN
  Administered 2023-02-23: 2 mg via INTRAVENOUS

## 2023-02-23 MED ORDER — MIDAZOLAM HCL 2 MG/2ML IJ SOLN
INTRAMUSCULAR | Status: AC
  Filled 2023-02-23: qty 2

## 2023-02-23 NOTE — Progress Notes (Signed)
Patient and husband concerned that she has  clots, not put out much urine via new suprapubic.  Does not want to be discharged yet. Bright red blood tinged urine in bag approx 150 cc.  RN- Jyl Heinz contacted Dr. Milford Cage and informed.   Agreeded to irrigate to make sure no clot formation. Flushed wit 30cc sterile water; flushes well no resistance, good return of fluid. Patient discharged home at 1805

## 2023-02-23 NOTE — Discharge Instructions (Signed)
Please call Interventional Radiology clinic [phone] with any questions or concerns.    Suprapubic Catheter Home Guide A suprapubic catheter is a flexible tube that is used to drain urine from the bladder into a collection bag outside the body. The catheter is inserted into the bladder through a small opening in the lower abdomen, above the pubic bone (suprapubic area) and a few inches below your belly button (navel). A tiny balloon filled with germ-free (sterile) water helps to keep the catheter in place. The collection bag must be emptied at least once a day and cleaned at least every other day. The collection bag can be put beside your bed at night and attached to your leg during the day. You may have a large collection bag to use at night and a smaller one to use during the day. Your suprapubic catheter may need to be changed every 4-6 weeks, or as often as recommended by your health care provider. Healing of the tract where the catheter is placed can take 6 weeks to 6 months. During that time, your health care provider may change your catheter. Once the tract is well healed, you or a caregiver will change your suprapubic catheter at home. What are the risks? This catheter is safe to use. However, problems can occur, including: Blocked urine flow. This can occur if the catheter stops working, or if you have a blood clot in your bladder or in the catheter. Irritation of the skin around the catheter. Infection. This can happen if bacteria gets into your bladder. Supplies needed: Two pairs of sterile gloves. Paper towels. Catheter. Two syringes. Sterile water. Sterile cleaning solution. Lubricant. Collection bags. How to change the catheter  Drink plenty of fluids during the hours before you change the catheter. Wash your hands with soap and water. If soap and water are not available, use hand sanitizer. Draw up sterile water into a syringe to have ready to fill the new catheter  balloon. The amount will depend on the size of the balloon. Have all of your supplies ready and close to you on a paper towel. Lie on your back, sitting slightly upright so that you can see the catheter and opening. Put on sterile gloves. Clean the skin around the catheter opening using the sterile cleaning solution. Remove the water from the balloon in the catheter using a syringe. Slowly remove the catheter. If the catheter seems stuck, or if you have difficulty removing it: Do not pull on it. Call your health care provider right away. Place the old catheter on a paper towel to discard later. Take off the used gloves, and put on a new pair. Put lubricant on the end of the new catheter that will go into your bladder. Clean the skin around the catheter opening using the sterile cleaning solution. Gently slide the catheter through the opening in your abdomen and into the tract that leads to your bladder. Wait for some urine to start flowing through the catheter. When urine starts to flow through the catheter, attach the collection bag to the end of the catheter. Make sure the connection is tight. Use a syringe to fill the catheter balloon with sterile water. Fill to the amount directed by your health care provider. Remove the gloves and wash your hands with soap and water. How to care for the skin around the catheter  Follow your health care provider's instructions on caring for your skin. Use a clean washcloth and soapy water to clean the skin around  your catheter every day. Pat the area dry with a clean paper towel. Do not pull on the catheter. Do not use ointment or lotion on this area, unless told by your health care provider. Check the skin around the catheter every day for signs of infection. Check for: Redness, swelling, or pain. Fluid or blood. Warmth. Pus or a bad smell. How to empty and clean the collection bag Empty the large collection bag every 8 hours. Empty the small  collection bag when it is about ? full. Clean the collection bag every 2-3 days, or as often as told by your health care provider. To do this: Wash your hands with soap and water. If soap and water are not available, use hand sanitizer. Disconnect the bag from the catheter and immediately attach a new bag to the catheter. Hold the used bag over the toilet or another container. Turn the valve (spigot) at the bottom of the bag to empty the urine. Empty the used bag completely. Do not touch the opening of the spigot. Do not let the opening touch the toilet or container. Close the spigot tightly when the bag is empty. Clean the used bag in one of the following methods: According to the manufacturer's instructions. As told by your health care provider. Let the bag dry completely. Put it in a clean plastic bag before storing it. General tips  Always wash your hands before and after caring for your catheter and collection bag. Use a mild, fragrance-free soap. If soap and water are not available, use hand sanitizer. Clean the outside of the catheter with soap and water as often as told by your health care provider. Always make sure there are no twists or kinks in the catheter tube. Always make sure there are no leaks in the catheter or collection bag. Always wear the leg bag below your knee. Make sure the overnight drainage bag is always lower than the level of your bladder, but do not let it touch the floor. Before you go to sleep, hang the bag inside a wastebasket that is covered by a clean plastic bag. Drink enough fluid to keep your urine pale yellow. Do not take baths, swim, or use a hot tub until your health care provider approves. Ask your health care provider if you may take showers. Contact a heath care provider if: You leak urine. You have redness, swelling, or pain around your catheter. You have fluid or blood coming from your catheter opening. Your catheter opening feels warm to the  touch. You have pus or a bad smell coming from your catheter opening. You have a fever or chills. Your urine flow slows down. Your urine becomes cloudy or smelly. Get help right away if: Your catheter comes out. You have: Nausea. Back pain. Difficulty changing your catheter. Blood in your urine. No urine flow for 1 hour. Summary A suprapubic catheter is a flexible tube that is used to drain urine from the bladder into a collection bag outside the body. Your suprapubic catheter may need to be changed every 4-6 weeks, or as recommended by your health care provider. Follow instructions on how to change the catheter and how to empty and clean the collection bag. Always wash your hands before and after caring for your catheter and collection bag. Drink enough fluid to keep your urine pale yellow. Get help right away if you have difficulty changing your catheter or if there is blood in your urine. This information is not intended to replace  advice given to you by your health care provider. Make sure you discuss any questions you have with your health care provider. Document Revised: 06/02/2019 Document Reviewed: 04/28/2018 Elsevier Patient Education  2023 [employer] Moderate Conscious Sedation-Care After  This sheet gives you information about how to care for yourself after your procedure. Your health care provider may also give you more specific instructions. If you have problems or questions, contact your health care provider.  After the procedure, it is common to have: Sleepiness for several hours. Impaired judgment for several hours. Difficulty with balance. Vomiting if you eat too soon.  Follow these instructions at home:  Rest. Do not participate in activities where you could fall or become injured. Do not drive or use machinery. Do not drink alcohol. Do not take sleeping pills or medicines that cause drowsiness. Do not make important decisions or sign legal documents. Do  not take care of children on your own.  Eating and drinking Follow the diet recommended by your health care provider. Drink enough fluid to keep your urine pale yellow. If you vomit: Drink water, juice, or soup when you can drink without vomiting. Make sure you have little or no nausea before eating solid foods.  General instructions Take over-the-counter and prescription medicines only as told by your health care provider. Have a responsible adult stay with you for the time you are told. It is important to have someone help care for you until you are awake and alert. Do not smoke. Keep all follow-up visits as told by your health care provider. This is important.  Contact a health care provider if: You are still sleepy or having trouble with balance after 24 hours. You feel light-headed. You keep feeling nauseous or you keep vomiting. You develop a rash. You have a fever. You have redness or swelling around the IV site.  Get help right away if: You have trouble breathing. You have new-onset confusion at home.  This information is not intended to replace advice given to you by your health care provider. Make sure you discuss any questions you have with your healthcare provider.

## 2023-02-23 NOTE — Procedures (Signed)
Vascular and Interventional Radiology Procedure Note  Patient: Brandy Hardy DOB: 07-11-1955 Medical Record Number: [id number] Note Date/Time: 02/23/23 4:26 PM   Performing Physician: Roanna Banning, MD Assistant(s): None  Diagnosis: Routine exchange. and upsize to Council catheter  Procedure: SUPRAPUBIC CYSTOSTOMY TUBE UPSIZE and EXCHANGE  Anesthesia: Conscious Sedation Complications: None Estimated Blood Loss: Minimal  Findings:  Successful exchange and upsize to a 23F Council suprapubic cystostomy tube under fluoroscopy.  Plan: Pt to follow up with Urology for routine SP catheter exchanges.   See detailed procedure note with images in PACS. The patient tolerated the procedure well without incident or complication and was returned to Recovery in stable condition.    Roanna Banning, MD [facility] Rad[facility]   Pager. [phone] Clinic. [phone]

## 2023-02-23 NOTE — Progress Notes (Addendum)
Referring Physician(s): Eskridge,Matthew  Supervising Physician: Roanna Banning  Patient Status:  [facility] OP  Chief Complaint: "I'm getting my bladder cath exchanged"   Subjective:  67 y.o. female with past medical history significant for morbid obesity, cervical/vaginal/endometrial cancer with prior chemoradiation and subsequent wound complications of the right groin including necrotizing fasciitis, pubic osteomyelitis with suprapubic debridement  as well as  gross hematuria and clot retention.  Patient has history of obliterated urethral meatus with urinary retention and multiple instances of inability to place urinary catheter due to narrowing friability of the vaginal introitus and difficult to visualize meatus. She underwent SP tube placement on 01/23/23. She presents again today for image guided SP cath exchanged/possible upsizing. She denies fever,HA,CP,dyspnea, cough, abd/back pain,N/V. She cont to have occ hematuria. There is also difficulty noted with aspirating blood from her port a cath.   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  Medications: Prior to Admission medications   Medication Sig Start Date End Date Taking? Authorizing Provider  amitriptyline (ELAVIL) 10 MG tablet Take 10 mg by mouth at bedtime.   Yes [provider]  Multiple Vitamin (MULTIVITAMIN WITH MINERALS) TABS tablet Take 1 tablet by mouth daily. 03/06/16  Yes Love, Evlyn Kanner, PA-C  Vitamin D, Ergocalciferol, (DRISDOL) 50000 units CAPS capsule TAKE 1 CAPSULE WEEKLY Patient taking differently: 50,000 Units See admin instructions. Twice weekly 05/19/16  Yes Reather Littler, MD  acetaminophen (TYLENOL) 325 MG tablet Take 650 mg by mouth every 6 (six)  hours as needed for mild pain (pain score 1-3) or moderate pain (pain score 4-6).    [provider]  ibuprofen (ADVIL) 200 MG tablet Take 200 mg by mouth every 6 (six) hours as needed for mild pain (pain score 1-3) or moderate pain (pain score 4-6).    [provider]  PREMARIN vaginal cream Place 1 application vaginally as needed. Patient not taking: Reported on 01/22/2023 04/30/17   [provider]     Vital Signs: BP (!) 143/77   Pulse 95   Temp 99.5 F (37.5 C) (Oral)   Resp 18   Ht 5\' 2"  (1.575 m)   Wt 288 lb (130.6 kg)   SpO2 97%   BMI 52.68 kg/m   Physical Exam: awake/alert; chest- CTA bilat; heart- RRR; abd-obese, soft,+BS,NT; SP cath in place draining light yellow urine  Imaging: No results found.  Labs:  CBC: Recent Labs    01/22/23 1558 01/23/23 0221 01/24/23 0500 01/25/23 0250  WBC 11.0* 12.4* 11.6* 8.8  HGB 13.9 13.0 11.8* 11.7*  HCT 41.8 39.2 36.9 36.2  PLT 445* 374 334 293    COAGS: Recent Labs    01/23/23 0922  INR 1.0    BMP: Recent Labs    01/22/23 1558 01/23/23 0427 01/24/23 0500 01/25/23 0250  NA 139 136 139 139  K 3.9 4.1 3.5 2.7*  CL 100 101 101 102  CO2 30 24 27 28   GLUCOSE 102* 128* 124* 127*  BUN 16 22 35* 16  CALCIUM 9.7 8.7* 8.5* 8.1*  CREATININE 0.58 1.18* 2.87* 0.80  GFRNONAA >60 51* 17* >60    LIVER FUNCTION TESTS: Recent  Labs    01/24/23 0500  ALBUMIN 2.7*    Assessment and Plan: 67 y.o. female with past medical history significant for morbid obesity, cervical/vaginal/endometrial cancer with prior chemoradiation and subsequent wound complications of the right groin including necrotizing fasciitis, pubic osteomyelitis with suprapubic debridement  as well as  gross hematuria and clot retention.  Patient has history of obliterated urethral meatus with urinary retention and multiple instances of inability to place urinary catheter due to narrowing friability of the vaginal introitus and  difficult to visualize meatus. She underwent SP tube placement on 01/23/23. She presents again today for image guided SP cath exchanged/possible upsizing. Risks and benefits discussed with the patient/spouse including bleeding, infection, damage to adjacent structures,  and sepsis.  All of the patient's questions were answered, patient is agreeable to proceed. Consent signed and in chart.  Pt also notes difficulty  with aspirating blood from her port a cath but infuses ok- pt may undergo port injection as well while here  Electronically Signed: D. Jeananne Rama, PA-C 02/23/2023, 2:16 PM   I spent a total of 20 minutes at the the patient's bedside AND on the patient's hospital floor or unit, greater than 50% of which was counseling/coordinating care for image guided suprapubic catheter exchange    Patient ID: Brandy Hardy, female   DOB: Oct 23, 1955, 67 y.o.   MRN: [id number]

## 2023-02-25 ENCOUNTER — Encounter (HOSPITAL_COMMUNITY): Payer: Self-pay | Admitting: Radiology

## 2023-02-28 ENCOUNTER — Encounter (HOSPITAL_COMMUNITY): Payer: Self-pay

## 2023-02-28 ENCOUNTER — Other Ambulatory Visit: Payer: Self-pay

## 2023-02-28 ENCOUNTER — Emergency Department (HOSPITAL_COMMUNITY)
Admission: EM | Admit: 2023-02-28 | Discharge: 2023-02-28 | Disposition: A | Discharge status: Home / Self Care | Payer: Medicare Other | Attending: Emergency Medicine | Admitting: Emergency Medicine

## 2023-02-28 DIAGNOSIS — E119 Type 2 diabetes mellitus without complications: Secondary | ICD-10-CM | POA: Insufficient documentation

## 2023-02-28 DIAGNOSIS — R310 Gross hematuria: Secondary | ICD-10-CM | POA: Diagnosis not present

## 2023-02-28 DIAGNOSIS — Y732 Prosthetic and other implants, materials and accessory gastroenterology and urology devices associated with adverse incidents: Secondary | ICD-10-CM | POA: Diagnosis not present

## 2023-02-28 DIAGNOSIS — Z8542 Personal history of malignant neoplasm of other parts of uterus: Secondary | ICD-10-CM | POA: Insufficient documentation

## 2023-02-28 DIAGNOSIS — T83090A Other mechanical complication of cystostomy catheter, initial encounter: Secondary | ICD-10-CM

## 2023-02-28 DIAGNOSIS — D72829 Elevated white blood cell count, unspecified: Secondary | ICD-10-CM | POA: Insufficient documentation

## 2023-02-28 DIAGNOSIS — T83098A Other mechanical complication of other indwelling urethral catheter, initial encounter: Secondary | ICD-10-CM | POA: Diagnosis present

## 2023-02-28 LAB — CBC WITH DIFFERENTIAL/PLATELET
Abs Immature Granulocytes: 0.04 10*3/uL (ref 0.00–0.07)
Basophils Absolute: 0.1 10*3/uL (ref 0.0–0.1)
Basophils Relative: 1 %
Eosinophils Absolute: 0.1 10*3/uL (ref 0.0–0.5)
Eosinophils Relative: 1 %
HCT: 41.1 % (ref 36.0–46.0)
Hemoglobin: 13.8 g/dL (ref 12.0–15.0)
Immature Granulocytes: 0 %
Lymphocytes Relative: 11 %
Lymphs Abs: 1.2 10*3/uL (ref 0.7–4.0)
MCH: 32.9 pg (ref 26.0–34.0)
MCHC: 33.6 g/dL (ref 30.0–36.0)
MCV: 98.1 fL (ref 80.0–100.0)
Monocytes Absolute: 0.7 10*3/uL (ref 0.1–1.0)
Monocytes Relative: 6 %
Neutro Abs: 8.9 10*3/uL — ABNORMAL HIGH (ref 1.7–7.7)
Neutrophils Relative %: 81 %
Platelets: 334 10*3/uL (ref 150–400)
RBC: 4.19 MIL/uL (ref 3.87–5.11)
RDW: 13.7 % (ref 11.5–15.5)
WBC: 10.9 10*3/uL — ABNORMAL HIGH (ref 4.0–10.5)
nRBC: 0 % (ref 0.0–0.2)

## 2023-02-28 LAB — BASIC METABOLIC PANEL WITH GFR
Anion gap: 8 (ref 5–15)
BUN: 13 mg/dL (ref 8–23)
CO2: 26 mmol/L (ref 22–32)
Calcium: 8.6 mg/dL — ABNORMAL LOW (ref 8.9–10.3)
Chloride: 103 mmol/L (ref 98–111)
Creatinine, Ser: 0.58 mg/dL (ref 0.44–1.00)
GFR, Estimated: >60 mL/min
Glucose, Bld: 115 mg/dL — ABNORMAL HIGH (ref 70–99)
Potassium: 3.5 mmol/L (ref 3.5–5.1)
Sodium: 137 mmol/L (ref 135–145)

## 2023-02-28 LAB — PROTIME-INR
INR: 0.9 (ref 0.8–1.2)
Prothrombin Time: 12.8 s (ref 11.4–15.2)

## 2023-02-28 MED ORDER — MORPHINE SULFATE (PF) 4 MG/ML IV SOLN
4.0000 mg | Freq: Once | INTRAVENOUS | Status: DC
Start: 2023-02-28 — End: 2023-02-28

## 2023-02-28 NOTE — Discharge Instructions (Signed)
You were seen in the emergency department for your catheter obstruction.  We were able to flush it and you have been draining blood-tinged urine since then.  You have no signs of anemia or that your blood was not clotting appropriately and your kidney function was normal.  You should follow-up with your urologist on Monday as scheduled for further evaluation of your hematuria and further recommendations to prevent obstruction of your catheter in the future.  You should return to the emergency department for recurrent obstructions, severe abdominal pain, if your catheter comes out, you have fevers or any other new or concerning symptoms.

## 2023-02-28 NOTE — ED Provider Notes (Signed)
[place] EMERGENCY DEPARTMENT AT [facility] Provider Note   CSN: [id number] Arrival date & time: 02/28/23  4010     History  Chief Complaint  Patient presents with   Foley Catheter Problem    Misti Hoye is a 67 y.o. female.  Patient is a 67 year old female with a past medical history of endometrial cancer, diabetes, obesity with suprapubic catheter in place presenting to the emergency department with concern for catheter obstruction.  Patient is here with her husband who states that she has had hematuria since her catheter was placed.  He states that they recently had at the catheter upsized by IR 1 week ago.  He states that previously her urine was blood-tinged but since last night she has been passing a lot of blood clots.  He states that this morning she was unable to pass urine and was having significant pain.  He states that he tried multiple times flushing the catheter with sterile water but did not have any success.  Denies any fevers, nausea or vomiting and states that the pain only started when her catheter was blocked.  He denies any blood thinner use.  The history is provided by the patient and the spouse.       Home Medications Prior to Admission medications   Medication Sig Start Date End Date Taking? Authorizing Provider  acetaminophen (TYLENOL) 325 MG tablet Take 650 mg by mouth every 6 (six) hours as needed for mild pain (pain score 1-3) or moderate pain (pain score 4-6).    [provider]  amitriptyline (ELAVIL) 10 MG tablet Take 10 mg by mouth at bedtime.    [provider]  ibuprofen (ADVIL) 200 MG tablet Take 200 mg by mouth every 6 (six) hours as needed for mild pain (pain score 1-3) or moderate pain (pain score 4-6).    [provider]  Multiple Vitamin (MULTIVITAMIN WITH MINERALS) TABS tablet Take 1 tablet by mouth daily. 03/06/16   Love, Evlyn Kanner, PA-C  PREMARIN vaginal cream Place 1 application vaginally as  needed. Patient not taking: Reported on 01/22/2023 04/30/17   [provider]  Vitamin D, Ergocalciferol, (DRISDOL) 50000 units CAPS capsule TAKE 1 CAPSULE WEEKLY Patient taking differently: 50,000 Units See admin instructions. Twice weekly 05/19/16   Reather Littler, MD      Allergies    Patient has no known allergies.    Review of Systems   Review of Systems  Physical Exam Updated Vital Signs BP 133/67   Pulse 94   Temp 98.3 F (36.8 C) (Oral)   Resp (!) 24   Ht 5\' 2"  (1.575 m)   Wt 130.6 kg   SpO2 96%   BMI 52.68 kg/m  Physical Exam Vitals and nursing note reviewed.  Constitutional:      General: She is not in acute distress.    Appearance: Normal appearance. She is obese.     Comments: Uncomfortable appearing  HENT:     Head: Normocephalic and atraumatic.     Nose: Nose normal.     Mouth/Throat:     Mouth: Mucous membranes are moist.     Pharynx: Oropharynx is clear.  Eyes:     Extraocular Movements: Extraocular movements intact.     Conjunctiva/sclera: Conjunctivae normal.  Cardiovascular:     Rate and Rhythm: Normal rate and regular rhythm.     Heart sounds: Normal heart sounds.  Pulmonary:     Effort: Pulmonary effort is normal.     Breath  sounds: Normal breath sounds.  Abdominal:     General: Abdomen is flat.     Palpations: Abdomen is soft.     Tenderness: There is abdominal tenderness (Suprapubic).     Comments: Suprapubic catheter in place, site is clean and dry, significant dark red blood with blood clots in catheter and Foley bag  Musculoskeletal:        General: Normal range of motion.     Cervical back: Normal range of motion.  Skin:    General: Skin is warm and dry.  Neurological:     General: No focal deficit present.     Mental Status: She is alert and oriented to person, place, and time.  Psychiatric:        Mood and Affect: Mood normal.        Behavior: Behavior normal.     ED Results / Procedures / Treatments   Labs (all labs  ordered are listed, but only abnormal results are displayed) Labs Reviewed  BASIC METABOLIC PANEL - Abnormal; Notable for the following components:      Result Value   Glucose, Bld 115 (*)    Calcium 8.6 (*)    All other components within normal limits  CBC WITH DIFFERENTIAL/PLATELET - Abnormal; Notable for the following components:   WBC 10.9 (*)    Neutro Abs 8.9 (*)    All other components within normal limits  PROTIME-INR    EKG None  Radiology No results found.  Procedures Procedures    Medications Ordered in ED Medications  morphine (PF) 4 MG/ML injection 4 mg (4 mg Intravenous Not Given 02/28/23 1244)    ED Course/ Medical Decision Making/ A&P Clinical Course as of 02/28/23 1451  Sat Feb 28, 2023  1154 RN was able to flush catheter with success and improvement of patient's pain. Will observe pending labs. Patient reports she has urology f/u scheduled for Monday. [VK]  1402 Mild leukocytosis. No anemia or coagulopathy, Cr at baseline. Patient continues to have well flowing urine. She is stable for discharge home with outpatient urology follow up as scheduled. [VK]    Clinical Course User Index [VK] Rexford Maus, DO                                 Medical Decision Making This patient presents to the ED with chief complaint(s) of catheter obstruction with pertinent past medical history of prior endometrial cancer status post radiation with suprapubic catheter in place, obesity, diabetes which further complicates the presenting complaint. The complaint involves an extensive differential diagnosis and also carries with it a high risk of complications and morbidity.    The differential diagnosis includes catheter obstruction, infection less likely as she had no pain prior to the obstruction, anemia, coagulopathy  Additional history obtained: Additional history obtained from family Records reviewed previous admission documents  ED Course and Reassessment: On  patient's arrival she is hemodynamically stable, significantly uncomfortable appearing but in no acute distress.  She does have significant blood with clots in the catheter and suspect that her catheter is clogged.  We will attempt to flush and irrigate the catheter.  She will labs performed to evaluate for anemia or coagulopathy.  No urinary symptoms a UTI less likely.  She will be closely reassessed.  Independent labs interpretation:  The following labs were independently interpreted: Mild leukocytosis otherwise within normal range  Independent visualization of imaging: - N/A  Consultation: - Consulted or discussed management/test interpretation w/ external professional: N/A  Consideration for admission or further workup: Patient has no emergent conditions requiring admission or further work-up at this time and is stable for discharge home with primary care follow-up  Social Determinants of health: N/A    Amount and/or Complexity of Data Reviewed Labs: ordered.  Risk Prescription drug management.          Final Clinical Impression(s) / ED Diagnoses Final diagnoses:  Gross hematuria  Obstruction of suprapubic catheter, initial encounter [facility])    Rx / DC Orders ED Discharge Orders     None         Rexford Maus, DO 02/28/23 1451

## 2023-02-28 NOTE — ED Triage Notes (Signed)
Patient reports replacement suprapubic cath. Patient has had blood in urine. Husband has flushed the catheter multiple times, but catheter is now blocked.

## 2023-03-09 ENCOUNTER — Other Ambulatory Visit (HOSPITAL_COMMUNITY): Payer: Medicare Other

## 2023-03-13 ENCOUNTER — Other Ambulatory Visit: Payer: Self-pay | Admitting: Radiology

## 2023-03-13 DIAGNOSIS — Z452 Encounter for adjustment and management of vascular access device: Secondary | ICD-10-CM

## 2023-03-13 NOTE — H&P (Incomplete)
Referring Physician(s): Albright,B  Supervising Physician: Oley Balm  Patient Status:  [facility] OP  Chief Complaint:  " I'm getting my Port-A-Cath fixed"  Subjective:  67 y.o. female with past medical history significant for morbid obesity, cervical/vaginal/endometrial cancer with prior chemoradiation and subsequent wound complications of the right groin including necrotizing fasciitis, pubic osteomyelitis with suprapubic debridement  as well as  gross hematuria and clot retention.  Patient has history of obliterated urethral meatus with urinary retention and multiple instances of inability to place urinary catheter due to narrowing friability of the vaginal introitus and difficult to visualize meatus. She underwent SP tube placement on 01/23/23 and exchange/upsizing on 02/23/2023.  At the time patient also noted difficulty with aspirating blood from her Port-A-Cath(placed at [facility] in 2015).  Cath injection revealed fibrin sheath.  She presents again today for fibrin sheath stripping. She denies fever,HA,CP,dyspnea, cough, abd/back pain,N/V. She does have some occ bleeding in SP cath.    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   Renal disorder    Past Surgical History:  Procedure Laterality Date   ABDOMINAL HYSTERECTOMY     I & D EXTREMITY Right 02/20/2016   Procedure: IRRIGATION AND DEBRIDEMENT EXTREMITY RIGHT UPPER THIGH;  Surgeon: Claud Kelp, MD;  Location: [facility] OR;  Service: General;  Laterality: Right;   INCISION AND DRAINAGE PERIRECTAL ABSCESS N/A 06/13/2017   Procedure: IRRIGATION AND DEBRIDEMENT OF MONS PUBIS ABSCESS;  Surgeon: Berna Bue, MD;  Location: [facility] OR;  Service: General;  Laterality: N/A;   IR CATHETER TUBE CHANGE  02/23/2023   IRRIGATION AND DEBRIDEMENT ABSCESS Right  02/16/2016   Procedure: IRRIGATION AND DEBRIDEMENT RIGHT GROIN ABSCESS, FULL THICKNESS DEBRIDMENT RIGHT GROIN INCLUDING > 40CM SQUARE.;  Surgeon: Rodman Pickle, MD;  Location: [facility] OR;  Service: General;  Laterality: Right;   IRRIGATION AND DEBRIDEMENT ABSCESS Right 02/20/2016   Procedure: IRRIGATION AND DEBRIDEMENT ABSCESS;  Surgeon: Claud Kelp, MD;  Location: [facility] OR;  Service: General;  Laterality: Right;  Perii-Vaginal Abscess      Allergies: Patient has no known allergies.  Medications: Prior to Admission medications   Medication Sig Start Date End Date Taking? Authorizing Provider  acetaminophen (TYLENOL) 325 MG tablet Take 650 mg by mouth every 6 (six) hours as needed for mild pain (pain score 1-3) or moderate pain (pain score 4-6).    [provider]  amitriptyline (ELAVIL) 10 MG tablet Take 10 mg by mouth at bedtime.    [provider]  ibuprofen (ADVIL) 200 MG tablet Take 200 mg by mouth every 6 (six) hours as needed for mild pain (pain score 1-3) or moderate pain (pain score 4-6).    [provider]  Multiple Vitamin (MULTIVITAMIN WITH MINERALS) TABS tablet Take 1 tablet by mouth daily. 03/06/16   Love, Evlyn Kanner, PA-C  PREMARIN vaginal cream Place 1 application vaginally as needed. Patient not taking: Reported on 01/22/2023 04/30/17   [provider]  Vitamin D, Ergocalciferol, (DRISDOL) 50000 units CAPS capsule TAKE 1 CAPSULE WEEKLY Patient taking differently: 50,000 Units See admin instructions. Twice weekly 05/19/16   Reather Littler, MD     Vital Signs: Vitals:   03/16/23 1204  BP: (!) 154/68  Pulse: 97  Resp: (!) 22  Temp: 99.3 F (37.4 C)  SpO2: 98%     Code Status: FULL CODE  Physical Exam: awake/alert; chest- CTA bilat ant; intact rt chest wall port a cath; heart- RRR; abd-obese, soft,+BS,NT; intact SP tube; trace-1+ pretibial edema bilat  Imaging: No results found.  Labs:  CBC: Recent Labs    01/23/23 0221  01/24/23 0500 01/25/23 0250 02/28/23 1239  WBC 12.4* 11.6* 8.8 10.9*  HGB 13.0 11.8* 11.7* 13.8  HCT 39.2 36.9 36.2 41.1  PLT 374 334 293 334    COAGS: Recent Labs    01/23/23 0922 02/28/23 1239  INR 1.0 0.9    BMP: Recent Labs    01/24/23 0500 01/25/23 0250 02/23/23 1342 02/28/23 1239  NA 139 139 137 137  K 3.5 2.7* 4.0 3.5  CL 101 102 100 103  CO2 27 28 28 26   GLUCOSE 124* 127* 95 115*  BUN 35* 16 14 13   CALCIUM 8.5* 8.1* 8.8* 8.6*  CREATININE 2.87* 0.80 0.65 0.58  GFRNONAA 17* >60 >60 >60    LIVER FUNCTION TESTS: Recent Labs    01/24/23 0500  ALBUMIN 2.7*    Assessment and Plan: 67 y.o. female with past medical history significant for morbid obesity, cervical/vaginal/endometrial cancer with prior chemoradiation and subsequent wound complications of the right groin including necrotizing fasciitis, pubic osteomyelitis with suprapubic debridement  as well as  gross hematuria and clot retention.  Patient has history of obliterated urethral meatus with urinary retention and multiple instances of inability to place urinary catheter due to narrowing friability of the vaginal introitus and difficult to visualize meatus. She underwent SP tube placement on 01/23/23 and exchange/upsizing on 02/23/2023.  At the time patient also noted difficulty with aspirating blood from her Port-A-Cath (placed at [facility] 2015).  Cath injection revealed fibrin sheath.  She presents again today for fibrin sheath stripping.  Details/risks of procedure, including but not limited to, internal bleeding, infection, injury to adjacent structures discussed with patient/spouse with their understanding and consent.   Electronically Signed: D. Jeananne Rama, PA-C 03/13/2023, 5:43 PM   I spent a total of 20 minutes at the the patient's bedside AND on the patient's hospital floor or unit, greater than 50% of which was counseling/coordinating care for Port-A-Cath fibrin sheath stripping

## 2023-03-16 ENCOUNTER — Other Ambulatory Visit: Payer: Self-pay

## 2023-03-16 ENCOUNTER — Other Ambulatory Visit (HOSPITAL_COMMUNITY): Payer: Self-pay | Admitting: Interventional Radiology

## 2023-03-16 ENCOUNTER — Encounter (HOSPITAL_COMMUNITY): Payer: Self-pay

## 2023-03-16 ENCOUNTER — Ambulatory Visit (HOSPITAL_COMMUNITY)
Admission: RE | Admit: 2023-03-16 | Discharge: 2023-03-16 | Disposition: A | Discharge status: Home / Self Care | Payer: Medicare Other | Source: Ambulatory Visit | Attending: Interventional Radiology | Admitting: Interventional Radiology

## 2023-03-16 ENCOUNTER — Ambulatory Visit (HOSPITAL_COMMUNITY)
Admission: RE | Admit: 2023-03-16 | Discharge: 2023-03-16 | Disposition: A | Discharge status: Home / Self Care | Payer: Medicare Other | Source: Ambulatory Visit | Attending: Interventional Radiology

## 2023-03-16 DIAGNOSIS — I878 Other specified disorders of veins: Secondary | ICD-10-CM

## 2023-03-16 DIAGNOSIS — Z452 Encounter for adjustment and management of vascular access device: Secondary | ICD-10-CM

## 2023-03-16 DIAGNOSIS — R339 Retention of urine, unspecified: Secondary | ICD-10-CM | POA: Insufficient documentation

## 2023-03-16 HISTORY — PX: IR REMOVE CV FIBRIN SHEATH: IMG699

## 2023-03-16 HISTORY — PX: IR CV LINE INJECTION: IMG2294

## 2023-03-16 HISTORY — PX: IR US GUIDE VASC ACCESS RIGHT: IMG2390

## 2023-03-16 LAB — CBC WITH DIFFERENTIAL/PLATELET
Abs Immature Granulocytes: 0.03 10*3/uL (ref 0.00–0.07)
Basophils Absolute: 0 10*3/uL (ref 0.0–0.1)
Basophils Relative: 1 %
Eosinophils Absolute: 0.1 10*3/uL (ref 0.0–0.5)
Eosinophils Relative: 2 %
HCT: 40.1 % (ref 36.0–46.0)
Hemoglobin: 12.7 g/dL (ref 12.0–15.0)
Immature Granulocytes: 0 %
Lymphocytes Relative: 16 %
Lymphs Abs: 1.3 10*3/uL (ref 0.7–4.0)
MCH: 31.8 pg (ref 26.0–34.0)
MCHC: 31.7 g/dL (ref 30.0–36.0)
MCV: 100.5 fL — ABNORMAL HIGH (ref 80.0–100.0)
Monocytes Absolute: 0.6 10*3/uL (ref 0.1–1.0)
Monocytes Relative: 7 %
Neutro Abs: 6.1 10*3/uL (ref 1.7–7.7)
Neutrophils Relative %: 74 %
Platelets: 357 10*3/uL (ref 150–400)
RBC: 3.99 MIL/uL (ref 3.87–5.11)
RDW: 13.8 % (ref 11.5–15.5)
WBC: 8.2 10*3/uL (ref 4.0–10.5)
nRBC: 0 % (ref 0.0–0.2)

## 2023-03-16 LAB — BASIC METABOLIC PANEL
Anion gap: 7 (ref 5–15)
BUN: 10 mg/dL (ref 8–23)
CO2: 27 mmol/L (ref 22–32)
Calcium: 8.7 mg/dL — ABNORMAL LOW (ref 8.9–10.3)
Chloride: 101 mmol/L (ref 98–111)
Creatinine, Ser: 0.63 mg/dL (ref 0.44–1.00)
GFR, Estimated: >60 mL/min (ref >60)
Glucose, Bld: 101 mg/dL — ABNORMAL HIGH (ref 70–99)
Potassium: 3.7 mmol/L (ref 3.5–5.1)
Sodium: 135 mmol/L (ref 135–145)

## 2023-03-16 LAB — PROTIME-INR
INR: 1 (ref 0.8–1.2)
Prothrombin Time: 13.1 s (ref 11.4–15.2)

## 2023-03-16 MED ORDER — MIDAZOLAM HCL 2 MG/2ML IJ SOLN
INTRAMUSCULAR | Status: AC | PRN
  Administered 2023-03-16 (×2): 1 mg via INTRAVENOUS

## 2023-03-16 MED ORDER — HYDROCODONE-ACETAMINOPHEN 5-325 MG PO TABS: 1.0000 | ORAL_TABLET | ORAL | Status: DC | PRN

## 2023-03-16 MED ORDER — FENTANYL CITRATE (PF) 100 MCG/2ML IJ SOLN
INTRAMUSCULAR | Status: AC
  Filled 2023-03-16: qty 2

## 2023-03-16 MED ORDER — HEPARIN SOD (PORK) LOCK FLUSH 100 UNIT/ML IV SOLN
INTRAVENOUS | Status: AC
Start: 2023-03-16 — End: ?
  Filled 2023-03-16: qty 5

## 2023-03-16 MED ORDER — LIDOCAINE-EPINEPHRINE 1 %-1:100000 IJ SOLN
INTRAMUSCULAR | Status: AC
  Filled 2023-03-16: qty 1

## 2023-03-16 MED ORDER — LIDOCAINE-EPINEPHRINE 1 %-1:100000 IJ SOLN
20.0000 mL | Freq: Once | INTRAMUSCULAR | Status: AC
  Administered 2023-03-16: 5 mL via INTRADERMAL

## 2023-03-16 MED ORDER — FENTANYL CITRATE (PF) 100 MCG/2ML IJ SOLN
INTRAMUSCULAR | Status: AC | PRN
  Administered 2023-03-16 (×2): 50 ug via INTRAVENOUS

## 2023-03-16 MED ORDER — MIDAZOLAM HCL 2 MG/2ML IJ SOLN
INTRAMUSCULAR | Status: AC
  Filled 2023-03-16: qty 2

## 2023-03-16 MED ORDER — ONDANSETRON HCL 4 MG/2ML IJ SOLN
INTRAMUSCULAR | Status: AC
  Filled 2023-03-16: qty 2

## 2023-03-16 MED ORDER — SODIUM CHLORIDE 0.9 % IV SOLN: INTRAVENOUS | Status: DC

## 2023-03-16 MED ORDER — IOHEXOL 300 MG/ML  SOLN
50.0000 mL | Freq: Once | INTRAMUSCULAR | Status: AC | PRN
  Administered 2023-03-16: 10 mL

## 2023-03-16 MED ORDER — HEPARIN SOD (PORK) LOCK FLUSH 100 UNIT/ML IV SOLN
500.0000 [IU] | Freq: Once | INTRAVENOUS | Status: AC
  Administered 2023-03-16: 500 [IU] via INTRAVENOUS

## 2023-03-16 NOTE — Sedation Documentation (Addendum)
Patient transported to short stay. Tonie RN at the bedside to receive patient handoff. Groin site intact. Venous access only. No drainage noted, soft to palpation. Port was accessed for procedure and de-accessed post. Heparin locked 500 units.

## 2023-03-16 NOTE — Discharge Instructions (Addendum)
Discharge Instructions:   Please call Interventional Radiology clinic [phone] with any questions or concerns.  You may remove your bandaid to right upper neck dressing to right groin and shower tomorrow.  Moderate Conscious Sedation, Adult, Care After This sheet gives you information about how to care for yourself after your procedure. Your health care provider may also give you more specific instructions. If you have problems or questions, contact your health care provider. What can I expect after the procedure? After the procedure, it is common to have: Sleepiness for several hours. Impaired judgment for several hours. Difficulty with balance. Vomiting if you eat too soon. Follow these instructions at home: For the time period you were told by your health care provider: Rest. Do not participate in activities where you could fall or become injured. Do not drive or use machinery. Do not drink alcohol. Do not take sleeping pills or medicines that cause drowsiness. Do not make important decisions or sign legal documents. Do not take care of children on your own. Eating and drinking  Follow the diet recommended by your health care provider. Drink enough fluid to keep your urine pale yellow. If you vomit: Drink water, juice, or soup when you can drink without vomiting. Make sure you have little or no nausea before eating solid foods. General instructions Take over-the-counter and prescription medicines only as told by your health care provider. Have a responsible adult stay with you for the time you are told. It is important to have someone help care for you until you are awake and alert. Do not smoke. Keep all follow-up visits as told by your health care provider. This is important. Contact a health care provider if: You are still sleepy or having trouble with balance after 24 hours. You feel light-headed. You keep feeling nauseous or you keep vomiting. You develop a  rash. You have a fever. You have redness or swelling around the IV site. Get help right away if: You have trouble breathing. You have new-onset confusion at home. Summary After the procedure, it is common to feel sleepy, have impaired judgment, or feel nauseous if you eat too soon. Rest after you get home. Know the things you should not do after the procedure. Follow the diet recommended by your health care provider and drink enough fluid to keep your urine pale yellow. Get help right away if you have trouble breathing or new-onset confusion at home. This information is not intended to replace advice given to you by your health care provider. Make sure you discuss any questions you have with your health care provider. Document Revised: 07/22/2019 Document Reviewed: 02/17/2019 Elsevier Patient Education  2023 [employer].

## 2023-04-28 NOTE — Addendum Note (Signed)
Encounter addended by: Arby Barrette on: 04/28/2023 1:51 PM  Actions taken: Imaging Exam ended

## 2023-06-04 ENCOUNTER — Encounter (HOSPITAL_BASED_OUTPATIENT_CLINIC_OR_DEPARTMENT_OTHER): Payer: Self-pay | Admitting: *Deleted

## 2023-06-08 ENCOUNTER — Ambulatory Visit
Admission: RE | Admit: 2023-06-08 | Discharge: 2023-06-08 | Disposition: A | Discharge status: Home / Self Care | Payer: Medicare Other | Source: Ambulatory Visit | Attending: Family Medicine | Admitting: Family Medicine

## 2023-06-08 DIAGNOSIS — Z13820 Encounter for screening for osteoporosis: Secondary | ICD-10-CM

## 2023-06-25 ENCOUNTER — Encounter (HOSPITAL_BASED_OUTPATIENT_CLINIC_OR_DEPARTMENT_OTHER): Admitting: General Surgery

## 2023-08-11 ENCOUNTER — Encounter (HOSPITAL_BASED_OUTPATIENT_CLINIC_OR_DEPARTMENT_OTHER): Attending: General Surgery | Admitting: General Surgery

## 2023-08-11 ENCOUNTER — Ambulatory Visit (HOSPITAL_COMMUNITY)
Admission: RE | Admit: 2023-08-11 | Discharge: 2023-08-11 | Disposition: A | Discharge status: Home / Self Care | Source: Ambulatory Visit | Attending: General Surgery | Admitting: General Surgery

## 2023-08-11 ENCOUNTER — Other Ambulatory Visit: Payer: Self-pay

## 2023-08-11 ENCOUNTER — Encounter (HOSPITAL_COMMUNITY): Payer: Self-pay

## 2023-08-11 ENCOUNTER — Other Ambulatory Visit (HOSPITAL_COMMUNITY)
Admission: RE | Admit: 2023-08-11 | Discharge: 2023-08-11 | Disposition: A | Discharge status: Home / Self Care | Source: Ambulatory Visit | Attending: General Surgery | Admitting: General Surgery

## 2023-08-11 ENCOUNTER — Encounter (HOSPITAL_COMMUNITY): Admission: RE | Admit: 2023-08-11 | Source: Ambulatory Visit

## 2023-08-11 ENCOUNTER — Other Ambulatory Visit (HOSPITAL_COMMUNITY): Payer: Self-pay | Admitting: General Surgery

## 2023-08-11 DIAGNOSIS — C549 Malignant neoplasm of corpus uteri, unspecified: Secondary | ICD-10-CM | POA: Insufficient documentation

## 2023-08-11 DIAGNOSIS — N3041 Irradiation cystitis with hematuria: Secondary | ICD-10-CM | POA: Insufficient documentation

## 2023-08-11 DIAGNOSIS — Z923 Personal history of irradiation: Secondary | ICD-10-CM | POA: Diagnosis not present

## 2023-08-11 DIAGNOSIS — Z01818 Encounter for other preprocedural examination: Secondary | ICD-10-CM | POA: Diagnosis present

## 2023-08-11 LAB — CBC WITH DIFFERENTIAL/PLATELET
Abs Immature Granulocytes: 0.05 10*3/uL (ref 0.00–0.07)
Basophils Absolute: 0.1 10*3/uL (ref 0.0–0.1)
Basophils Relative: 1 %
Eosinophils Absolute: 0.2 10*3/uL (ref 0.0–0.5)
Eosinophils Relative: 2 %
HCT: 36.4 % (ref 36.0–46.0)
Hemoglobin: 10.9 g/dL — ABNORMAL LOW (ref 12.0–15.0)
Immature Granulocytes: 1 %
Lymphocytes Relative: 21 %
Lymphs Abs: 1.9 10*3/uL (ref 0.7–4.0)
MCH: 26.8 pg (ref 26.0–34.0)
MCHC: 29.9 g/dL — ABNORMAL LOW (ref 30.0–36.0)
MCV: 89.4 fL (ref 80.0–100.0)
Monocytes Absolute: 0.7 10*3/uL (ref 0.1–1.0)
Monocytes Relative: 7 %
Neutro Abs: 6.2 10*3/uL (ref 1.7–7.7)
Neutrophils Relative %: 68 %
Platelets: 373 10*3/uL (ref 150–400)
RBC: 4.07 MIL/uL (ref 3.87–5.11)
RDW: 17.2 % — ABNORMAL HIGH (ref 11.5–15.5)
WBC: 9 10*3/uL (ref 4.0–10.5)
nRBC: 0 % (ref 0.0–0.2)

## 2023-08-17 ENCOUNTER — Encounter (HOSPITAL_BASED_OUTPATIENT_CLINIC_OR_DEPARTMENT_OTHER): Admitting: Internal Medicine

## 2023-08-17 DIAGNOSIS — N3041 Irradiation cystitis with hematuria: Secondary | ICD-10-CM | POA: Diagnosis not present

## 2023-08-18 ENCOUNTER — Encounter (HOSPITAL_BASED_OUTPATIENT_CLINIC_OR_DEPARTMENT_OTHER): Admitting: Internal Medicine

## 2023-08-18 DIAGNOSIS — N3041 Irradiation cystitis with hematuria: Secondary | ICD-10-CM | POA: Diagnosis not present

## 2023-08-19 ENCOUNTER — Encounter (HOSPITAL_BASED_OUTPATIENT_CLINIC_OR_DEPARTMENT_OTHER): Admitting: Internal Medicine

## 2023-08-19 DIAGNOSIS — N3041 Irradiation cystitis with hematuria: Secondary | ICD-10-CM | POA: Diagnosis not present

## 2023-08-20 ENCOUNTER — Encounter (HOSPITAL_BASED_OUTPATIENT_CLINIC_OR_DEPARTMENT_OTHER): Admitting: Internal Medicine

## 2023-08-20 DIAGNOSIS — N3041 Irradiation cystitis with hematuria: Secondary | ICD-10-CM | POA: Diagnosis not present

## 2023-08-21 ENCOUNTER — Encounter (HOSPITAL_BASED_OUTPATIENT_CLINIC_OR_DEPARTMENT_OTHER): Admitting: Internal Medicine

## 2023-08-21 DIAGNOSIS — N3041 Irradiation cystitis with hematuria: Secondary | ICD-10-CM | POA: Diagnosis not present

## 2023-08-24 ENCOUNTER — Encounter (HOSPITAL_BASED_OUTPATIENT_CLINIC_OR_DEPARTMENT_OTHER): Admitting: General Surgery

## 2023-08-24 DIAGNOSIS — N3041 Irradiation cystitis with hematuria: Secondary | ICD-10-CM | POA: Diagnosis not present

## 2023-08-25 ENCOUNTER — Encounter (HOSPITAL_BASED_OUTPATIENT_CLINIC_OR_DEPARTMENT_OTHER): Admitting: General Surgery

## 2023-08-25 DIAGNOSIS — N3041 Irradiation cystitis with hematuria: Secondary | ICD-10-CM | POA: Diagnosis not present

## 2023-08-26 ENCOUNTER — Encounter (HOSPITAL_BASED_OUTPATIENT_CLINIC_OR_DEPARTMENT_OTHER): Admitting: General Surgery

## 2023-08-26 DIAGNOSIS — N3041 Irradiation cystitis with hematuria: Secondary | ICD-10-CM | POA: Diagnosis not present

## 2023-08-27 ENCOUNTER — Encounter (HOSPITAL_BASED_OUTPATIENT_CLINIC_OR_DEPARTMENT_OTHER): Admitting: General Surgery

## 2023-08-27 DIAGNOSIS — N3041 Irradiation cystitis with hematuria: Secondary | ICD-10-CM | POA: Diagnosis not present

## 2023-08-28 ENCOUNTER — Encounter (HOSPITAL_BASED_OUTPATIENT_CLINIC_OR_DEPARTMENT_OTHER): Admitting: Internal Medicine

## 2023-09-01 ENCOUNTER — Encounter (HOSPITAL_BASED_OUTPATIENT_CLINIC_OR_DEPARTMENT_OTHER): Admitting: General Surgery

## 2023-09-01 DIAGNOSIS — N3041 Irradiation cystitis with hematuria: Secondary | ICD-10-CM | POA: Diagnosis not present

## 2023-09-02 ENCOUNTER — Encounter (HOSPITAL_BASED_OUTPATIENT_CLINIC_OR_DEPARTMENT_OTHER): Admitting: General Surgery

## 2023-09-02 DIAGNOSIS — N3041 Irradiation cystitis with hematuria: Secondary | ICD-10-CM | POA: Diagnosis not present

## 2023-09-03 ENCOUNTER — Encounter (HOSPITAL_BASED_OUTPATIENT_CLINIC_OR_DEPARTMENT_OTHER): Admitting: General Surgery

## 2023-09-03 DIAGNOSIS — N3041 Irradiation cystitis with hematuria: Secondary | ICD-10-CM | POA: Diagnosis not present

## 2023-09-04 ENCOUNTER — Encounter (HOSPITAL_BASED_OUTPATIENT_CLINIC_OR_DEPARTMENT_OTHER): Admitting: General Surgery

## 2023-09-04 DIAGNOSIS — N3041 Irradiation cystitis with hematuria: Secondary | ICD-10-CM | POA: Diagnosis not present

## 2023-09-07 ENCOUNTER — Encounter (HOSPITAL_BASED_OUTPATIENT_CLINIC_OR_DEPARTMENT_OTHER): Attending: General Surgery | Admitting: General Surgery

## 2023-09-07 DIAGNOSIS — E119 Type 2 diabetes mellitus without complications: Secondary | ICD-10-CM | POA: Diagnosis not present

## 2023-09-07 DIAGNOSIS — N3041 Irradiation cystitis with hematuria: Secondary | ICD-10-CM | POA: Diagnosis present

## 2023-09-07 DIAGNOSIS — C549 Malignant neoplasm of corpus uteri, unspecified: Secondary | ICD-10-CM | POA: Insufficient documentation

## 2023-09-07 DIAGNOSIS — Z923 Personal history of irradiation: Secondary | ICD-10-CM | POA: Diagnosis not present

## 2023-09-08 ENCOUNTER — Encounter (HOSPITAL_BASED_OUTPATIENT_CLINIC_OR_DEPARTMENT_OTHER): Admitting: General Surgery

## 2023-09-08 DIAGNOSIS — N3041 Irradiation cystitis with hematuria: Secondary | ICD-10-CM | POA: Diagnosis not present

## 2023-09-09 ENCOUNTER — Encounter (HOSPITAL_BASED_OUTPATIENT_CLINIC_OR_DEPARTMENT_OTHER): Admitting: General Surgery

## 2023-09-09 DIAGNOSIS — N3041 Irradiation cystitis with hematuria: Secondary | ICD-10-CM | POA: Diagnosis not present

## 2023-09-10 ENCOUNTER — Encounter (HOSPITAL_BASED_OUTPATIENT_CLINIC_OR_DEPARTMENT_OTHER): Admitting: General Surgery

## 2023-09-10 DIAGNOSIS — N3041 Irradiation cystitis with hematuria: Secondary | ICD-10-CM | POA: Diagnosis not present

## 2023-09-11 ENCOUNTER — Encounter (HOSPITAL_BASED_OUTPATIENT_CLINIC_OR_DEPARTMENT_OTHER): Admitting: General Surgery

## 2023-09-11 DIAGNOSIS — N3041 Irradiation cystitis with hematuria: Secondary | ICD-10-CM | POA: Diagnosis not present

## 2023-09-14 ENCOUNTER — Encounter (HOSPITAL_BASED_OUTPATIENT_CLINIC_OR_DEPARTMENT_OTHER): Admitting: General Surgery

## 2023-09-14 DIAGNOSIS — N3041 Irradiation cystitis with hematuria: Secondary | ICD-10-CM | POA: Diagnosis not present

## 2023-09-15 ENCOUNTER — Encounter (HOSPITAL_BASED_OUTPATIENT_CLINIC_OR_DEPARTMENT_OTHER): Admitting: General Surgery

## 2023-09-15 DIAGNOSIS — N3041 Irradiation cystitis with hematuria: Secondary | ICD-10-CM | POA: Diagnosis not present

## 2023-09-16 ENCOUNTER — Encounter (HOSPITAL_BASED_OUTPATIENT_CLINIC_OR_DEPARTMENT_OTHER): Admitting: General Surgery

## 2023-09-16 DIAGNOSIS — N3041 Irradiation cystitis with hematuria: Secondary | ICD-10-CM | POA: Diagnosis not present

## 2023-09-17 ENCOUNTER — Encounter (HOSPITAL_BASED_OUTPATIENT_CLINIC_OR_DEPARTMENT_OTHER): Admitting: General Surgery

## 2023-09-17 DIAGNOSIS — N3041 Irradiation cystitis with hematuria: Secondary | ICD-10-CM | POA: Diagnosis not present

## 2023-09-18 ENCOUNTER — Encounter (HOSPITAL_BASED_OUTPATIENT_CLINIC_OR_DEPARTMENT_OTHER): Admitting: General Surgery

## 2023-09-18 DIAGNOSIS — N3041 Irradiation cystitis with hematuria: Secondary | ICD-10-CM | POA: Diagnosis not present

## 2023-09-21 ENCOUNTER — Encounter (HOSPITAL_BASED_OUTPATIENT_CLINIC_OR_DEPARTMENT_OTHER): Admitting: General Surgery

## 2023-09-21 DIAGNOSIS — N3041 Irradiation cystitis with hematuria: Secondary | ICD-10-CM | POA: Diagnosis not present

## 2023-09-22 ENCOUNTER — Encounter (HOSPITAL_BASED_OUTPATIENT_CLINIC_OR_DEPARTMENT_OTHER): Admitting: General Surgery

## 2023-09-22 DIAGNOSIS — N3041 Irradiation cystitis with hematuria: Secondary | ICD-10-CM | POA: Diagnosis not present

## 2023-09-23 ENCOUNTER — Encounter (HOSPITAL_BASED_OUTPATIENT_CLINIC_OR_DEPARTMENT_OTHER): Admitting: General Surgery

## 2023-09-23 DIAGNOSIS — N3041 Irradiation cystitis with hematuria: Secondary | ICD-10-CM | POA: Diagnosis not present

## 2023-09-24 ENCOUNTER — Encounter (HOSPITAL_BASED_OUTPATIENT_CLINIC_OR_DEPARTMENT_OTHER): Admitting: General Surgery

## 2023-09-24 DIAGNOSIS — N3041 Irradiation cystitis with hematuria: Secondary | ICD-10-CM | POA: Diagnosis not present

## 2023-09-25 ENCOUNTER — Encounter (HOSPITAL_BASED_OUTPATIENT_CLINIC_OR_DEPARTMENT_OTHER): Admitting: General Surgery

## 2023-09-25 DIAGNOSIS — N3041 Irradiation cystitis with hematuria: Secondary | ICD-10-CM | POA: Diagnosis not present

## 2023-09-28 ENCOUNTER — Encounter (HOSPITAL_BASED_OUTPATIENT_CLINIC_OR_DEPARTMENT_OTHER): Admitting: General Surgery

## 2023-09-28 DIAGNOSIS — N3041 Irradiation cystitis with hematuria: Secondary | ICD-10-CM | POA: Diagnosis not present

## 2023-09-29 ENCOUNTER — Encounter (HOSPITAL_BASED_OUTPATIENT_CLINIC_OR_DEPARTMENT_OTHER): Admitting: General Surgery

## 2023-09-29 DIAGNOSIS — N3041 Irradiation cystitis with hematuria: Secondary | ICD-10-CM | POA: Diagnosis not present

## 2023-09-30 ENCOUNTER — Encounter (HOSPITAL_BASED_OUTPATIENT_CLINIC_OR_DEPARTMENT_OTHER): Admitting: General Surgery

## 2023-09-30 DIAGNOSIS — N3041 Irradiation cystitis with hematuria: Secondary | ICD-10-CM | POA: Diagnosis not present

## 2023-10-01 ENCOUNTER — Encounter (HOSPITAL_BASED_OUTPATIENT_CLINIC_OR_DEPARTMENT_OTHER): Admitting: General Surgery

## 2023-10-01 DIAGNOSIS — N3041 Irradiation cystitis with hematuria: Secondary | ICD-10-CM | POA: Diagnosis not present

## 2023-10-02 ENCOUNTER — Encounter (HOSPITAL_BASED_OUTPATIENT_CLINIC_OR_DEPARTMENT_OTHER): Admitting: General Surgery

## 2023-10-02 DIAGNOSIS — N3041 Irradiation cystitis with hematuria: Secondary | ICD-10-CM | POA: Diagnosis not present

## 2023-10-05 ENCOUNTER — Encounter (HOSPITAL_BASED_OUTPATIENT_CLINIC_OR_DEPARTMENT_OTHER): Admitting: General Surgery

## 2023-10-05 DIAGNOSIS — N3041 Irradiation cystitis with hematuria: Secondary | ICD-10-CM | POA: Diagnosis not present

## 2023-10-06 ENCOUNTER — Encounter (HOSPITAL_BASED_OUTPATIENT_CLINIC_OR_DEPARTMENT_OTHER): Attending: General Surgery | Admitting: General Surgery

## 2023-10-06 DIAGNOSIS — Z923 Personal history of irradiation: Secondary | ICD-10-CM | POA: Insufficient documentation

## 2023-10-06 DIAGNOSIS — N3041 Irradiation cystitis with hematuria: Secondary | ICD-10-CM | POA: Diagnosis present

## 2023-10-06 DIAGNOSIS — E119 Type 2 diabetes mellitus without complications: Secondary | ICD-10-CM | POA: Insufficient documentation

## 2023-10-06 DIAGNOSIS — Z8542 Personal history of malignant neoplasm of other parts of uterus: Secondary | ICD-10-CM | POA: Diagnosis not present

## 2023-10-07 ENCOUNTER — Encounter (HOSPITAL_BASED_OUTPATIENT_CLINIC_OR_DEPARTMENT_OTHER): Admitting: General Surgery

## 2023-10-07 DIAGNOSIS — N3041 Irradiation cystitis with hematuria: Secondary | ICD-10-CM | POA: Diagnosis not present

## 2023-10-08 ENCOUNTER — Encounter (HOSPITAL_BASED_OUTPATIENT_CLINIC_OR_DEPARTMENT_OTHER): Admitting: General Surgery

## 2023-10-08 DIAGNOSIS — N3041 Irradiation cystitis with hematuria: Secondary | ICD-10-CM | POA: Diagnosis not present

## 2023-10-12 ENCOUNTER — Encounter (HOSPITAL_BASED_OUTPATIENT_CLINIC_OR_DEPARTMENT_OTHER): Admitting: General Surgery

## 2023-10-12 DIAGNOSIS — N3041 Irradiation cystitis with hematuria: Secondary | ICD-10-CM | POA: Diagnosis not present

## 2023-10-13 ENCOUNTER — Encounter (HOSPITAL_BASED_OUTPATIENT_CLINIC_OR_DEPARTMENT_OTHER): Admitting: General Surgery

## 2023-10-13 DIAGNOSIS — N3041 Irradiation cystitis with hematuria: Secondary | ICD-10-CM | POA: Diagnosis not present

## 2023-10-14 ENCOUNTER — Encounter (HOSPITAL_BASED_OUTPATIENT_CLINIC_OR_DEPARTMENT_OTHER): Admitting: Internal Medicine

## 2023-10-14 DIAGNOSIS — Z923 Personal history of irradiation: Secondary | ICD-10-CM

## 2023-10-14 DIAGNOSIS — N3041 Irradiation cystitis with hematuria: Secondary | ICD-10-CM | POA: Diagnosis not present

## 2023-10-14 DIAGNOSIS — C549 Malignant neoplasm of corpus uteri, unspecified: Secondary | ICD-10-CM

## 2023-10-15 ENCOUNTER — Encounter (HOSPITAL_BASED_OUTPATIENT_CLINIC_OR_DEPARTMENT_OTHER): Admitting: Internal Medicine

## 2023-10-15 DIAGNOSIS — C549 Malignant neoplasm of corpus uteri, unspecified: Secondary | ICD-10-CM

## 2023-10-15 DIAGNOSIS — Z923 Personal history of irradiation: Secondary | ICD-10-CM | POA: Diagnosis not present

## 2023-10-15 DIAGNOSIS — N3041 Irradiation cystitis with hematuria: Secondary | ICD-10-CM

## 2023-10-16 ENCOUNTER — Encounter (HOSPITAL_BASED_OUTPATIENT_CLINIC_OR_DEPARTMENT_OTHER): Admitting: General Surgery

## 2023-10-16 DIAGNOSIS — N3041 Irradiation cystitis with hematuria: Secondary | ICD-10-CM | POA: Diagnosis not present

## 2023-10-19 ENCOUNTER — Encounter (HOSPITAL_BASED_OUTPATIENT_CLINIC_OR_DEPARTMENT_OTHER): Admitting: Internal Medicine

## 2023-10-19 DIAGNOSIS — C549 Malignant neoplasm of corpus uteri, unspecified: Secondary | ICD-10-CM

## 2023-10-19 DIAGNOSIS — Z923 Personal history of irradiation: Secondary | ICD-10-CM

## 2023-10-19 DIAGNOSIS — N3041 Irradiation cystitis with hematuria: Secondary | ICD-10-CM

## 2023-10-20 ENCOUNTER — Encounter (HOSPITAL_BASED_OUTPATIENT_CLINIC_OR_DEPARTMENT_OTHER): Admitting: General Surgery

## 2023-10-20 DIAGNOSIS — N3041 Irradiation cystitis with hematuria: Secondary | ICD-10-CM

## 2023-10-20 DIAGNOSIS — C549 Malignant neoplasm of corpus uteri, unspecified: Secondary | ICD-10-CM

## 2023-10-20 DIAGNOSIS — Z923 Personal history of irradiation: Secondary | ICD-10-CM | POA: Diagnosis not present

## 2023-10-21 ENCOUNTER — Encounter (HOSPITAL_BASED_OUTPATIENT_CLINIC_OR_DEPARTMENT_OTHER): Admitting: General Surgery

## 2023-10-21 DIAGNOSIS — N3041 Irradiation cystitis with hematuria: Secondary | ICD-10-CM | POA: Diagnosis not present

## 2023-10-22 ENCOUNTER — Encounter (HOSPITAL_BASED_OUTPATIENT_CLINIC_OR_DEPARTMENT_OTHER): Admitting: General Surgery

## 2023-10-22 DIAGNOSIS — N3041 Irradiation cystitis with hematuria: Secondary | ICD-10-CM | POA: Diagnosis not present

## 2023-10-23 ENCOUNTER — Encounter (HOSPITAL_BASED_OUTPATIENT_CLINIC_OR_DEPARTMENT_OTHER): Admitting: General Surgery

## 2023-10-23 DIAGNOSIS — N3041 Irradiation cystitis with hematuria: Secondary | ICD-10-CM | POA: Diagnosis not present

## 2023-10-26 ENCOUNTER — Encounter (HOSPITAL_BASED_OUTPATIENT_CLINIC_OR_DEPARTMENT_OTHER): Admitting: General Surgery

## 2023-10-26 DIAGNOSIS — N3041 Irradiation cystitis with hematuria: Secondary | ICD-10-CM | POA: Diagnosis not present

## 2023-10-27 ENCOUNTER — Encounter (HOSPITAL_BASED_OUTPATIENT_CLINIC_OR_DEPARTMENT_OTHER): Admitting: General Surgery

## 2023-10-27 DIAGNOSIS — N3041 Irradiation cystitis with hematuria: Secondary | ICD-10-CM | POA: Diagnosis not present

## 2023-10-28 ENCOUNTER — Encounter (HOSPITAL_BASED_OUTPATIENT_CLINIC_OR_DEPARTMENT_OTHER): Admitting: General Surgery

## 2023-10-28 DIAGNOSIS — N3041 Irradiation cystitis with hematuria: Secondary | ICD-10-CM | POA: Diagnosis not present

## 2023-10-29 ENCOUNTER — Encounter (HOSPITAL_BASED_OUTPATIENT_CLINIC_OR_DEPARTMENT_OTHER): Admitting: General Surgery

## 2023-10-29 DIAGNOSIS — N3041 Irradiation cystitis with hematuria: Secondary | ICD-10-CM | POA: Diagnosis not present

## 2023-10-30 ENCOUNTER — Encounter (HOSPITAL_BASED_OUTPATIENT_CLINIC_OR_DEPARTMENT_OTHER): Admitting: General Surgery

## 2023-10-30 DIAGNOSIS — N3041 Irradiation cystitis with hematuria: Secondary | ICD-10-CM | POA: Diagnosis not present

## 2023-11-02 ENCOUNTER — Encounter (HOSPITAL_BASED_OUTPATIENT_CLINIC_OR_DEPARTMENT_OTHER): Admitting: General Surgery

## 2023-11-02 DIAGNOSIS — N3041 Irradiation cystitis with hematuria: Secondary | ICD-10-CM | POA: Diagnosis not present

## 2023-11-03 ENCOUNTER — Encounter (HOSPITAL_BASED_OUTPATIENT_CLINIC_OR_DEPARTMENT_OTHER): Admitting: General Surgery

## 2023-11-04 ENCOUNTER — Encounter (HOSPITAL_BASED_OUTPATIENT_CLINIC_OR_DEPARTMENT_OTHER): Admitting: General Surgery

## 2023-11-04 DIAGNOSIS — N3041 Irradiation cystitis with hematuria: Secondary | ICD-10-CM | POA: Diagnosis not present

## 2023-11-05 ENCOUNTER — Encounter (HOSPITAL_BASED_OUTPATIENT_CLINIC_OR_DEPARTMENT_OTHER): Admitting: General Surgery

## 2023-11-05 DIAGNOSIS — N3041 Irradiation cystitis with hematuria: Secondary | ICD-10-CM | POA: Diagnosis not present

## 2023-11-06 ENCOUNTER — Encounter (HOSPITAL_BASED_OUTPATIENT_CLINIC_OR_DEPARTMENT_OTHER): Admitting: General Surgery

## 2023-11-06 ENCOUNTER — Encounter (HOSPITAL_BASED_OUTPATIENT_CLINIC_OR_DEPARTMENT_OTHER): Attending: General Surgery | Admitting: General Surgery

## 2023-11-06 DIAGNOSIS — N3041 Irradiation cystitis with hematuria: Secondary | ICD-10-CM | POA: Diagnosis present

## 2023-11-06 DIAGNOSIS — C549 Malignant neoplasm of corpus uteri, unspecified: Secondary | ICD-10-CM | POA: Insufficient documentation

## 2023-11-06 DIAGNOSIS — Z923 Personal history of irradiation: Secondary | ICD-10-CM | POA: Insufficient documentation

## 2023-11-09 ENCOUNTER — Encounter (HOSPITAL_BASED_OUTPATIENT_CLINIC_OR_DEPARTMENT_OTHER): Admitting: General Surgery

## 2023-11-10 ENCOUNTER — Encounter (HOSPITAL_BASED_OUTPATIENT_CLINIC_OR_DEPARTMENT_OTHER): Admitting: General Surgery

## 2023-11-10 DIAGNOSIS — N3041 Irradiation cystitis with hematuria: Secondary | ICD-10-CM | POA: Diagnosis not present

## 2023-11-11 ENCOUNTER — Encounter (HOSPITAL_BASED_OUTPATIENT_CLINIC_OR_DEPARTMENT_OTHER): Admitting: General Surgery

## 2023-11-11 DIAGNOSIS — N3041 Irradiation cystitis with hematuria: Secondary | ICD-10-CM | POA: Diagnosis not present

## 2023-11-12 ENCOUNTER — Encounter (HOSPITAL_BASED_OUTPATIENT_CLINIC_OR_DEPARTMENT_OTHER): Admitting: General Surgery

## 2023-11-12 DIAGNOSIS — N3041 Irradiation cystitis with hematuria: Secondary | ICD-10-CM | POA: Diagnosis not present

## 2023-11-13 ENCOUNTER — Encounter (HOSPITAL_BASED_OUTPATIENT_CLINIC_OR_DEPARTMENT_OTHER): Admitting: General Surgery

## 2023-11-13 DIAGNOSIS — N3041 Irradiation cystitis with hematuria: Secondary | ICD-10-CM | POA: Diagnosis not present

## 2023-11-16 ENCOUNTER — Encounter (HOSPITAL_BASED_OUTPATIENT_CLINIC_OR_DEPARTMENT_OTHER): Admitting: General Surgery

## 2023-11-16 DIAGNOSIS — N3041 Irradiation cystitis with hematuria: Secondary | ICD-10-CM | POA: Diagnosis not present

## 2023-11-17 ENCOUNTER — Encounter (HOSPITAL_BASED_OUTPATIENT_CLINIC_OR_DEPARTMENT_OTHER): Admitting: General Surgery

## 2023-11-17 DIAGNOSIS — N3041 Irradiation cystitis with hematuria: Secondary | ICD-10-CM | POA: Diagnosis not present

## 2023-11-18 ENCOUNTER — Encounter (HOSPITAL_BASED_OUTPATIENT_CLINIC_OR_DEPARTMENT_OTHER): Admitting: General Surgery

## 2023-11-18 DIAGNOSIS — N3041 Irradiation cystitis with hematuria: Secondary | ICD-10-CM | POA: Diagnosis not present

## 2023-11-19 ENCOUNTER — Encounter (HOSPITAL_BASED_OUTPATIENT_CLINIC_OR_DEPARTMENT_OTHER): Admitting: General Surgery

## 2023-11-19 DIAGNOSIS — N3041 Irradiation cystitis with hematuria: Secondary | ICD-10-CM | POA: Diagnosis not present

## 2023-11-20 ENCOUNTER — Encounter (HOSPITAL_BASED_OUTPATIENT_CLINIC_OR_DEPARTMENT_OTHER): Admitting: General Surgery

## 2023-11-20 DIAGNOSIS — N3041 Irradiation cystitis with hematuria: Secondary | ICD-10-CM | POA: Diagnosis not present

## 2023-11-22 ENCOUNTER — Encounter (HOSPITAL_COMMUNITY): Payer: Self-pay

## 2023-11-22 ENCOUNTER — Other Ambulatory Visit: Payer: Self-pay

## 2023-11-22 ENCOUNTER — Emergency Department (HOSPITAL_COMMUNITY)
Admission: EM | Admit: 2023-11-22 | Discharge: 2023-11-22 | Disposition: A | Discharge status: Home / Self Care | Attending: Emergency Medicine | Admitting: Emergency Medicine

## 2023-11-22 DIAGNOSIS — Y732 Prosthetic and other implants, materials and accessory gastroenterology and urology devices associated with adverse incidents: Secondary | ICD-10-CM | POA: Insufficient documentation

## 2023-11-22 DIAGNOSIS — T83091A Other mechanical complication of indwelling urethral catheter, initial encounter: Secondary | ICD-10-CM | POA: Insufficient documentation

## 2023-11-22 DIAGNOSIS — T83010A Breakdown (mechanical) of cystostomy catheter, initial encounter: Secondary | ICD-10-CM

## 2023-11-22 DIAGNOSIS — N390 Urinary tract infection, site not specified: Secondary | ICD-10-CM

## 2023-11-22 DIAGNOSIS — Z8542 Personal history of malignant neoplasm of other parts of uterus: Secondary | ICD-10-CM | POA: Diagnosis not present

## 2023-11-22 LAB — URINALYSIS, W/ REFLEX TO CULTURE (INFECTION SUSPECTED)
Bilirubin Urine: NEGATIVE
Glucose, UA: NEGATIVE mg/dL
Ketones, ur: NEGATIVE mg/dL
Nitrite: NEGATIVE
Protein, ur: 100 mg/dL — AB
RBC / HPF: >50 RBC/hpf (ref 0–5)
Specific Gravity, Urine: 1.008 (ref 1.005–1.030)
WBC, UA: >50 WBC/hpf (ref 0–5)
pH: 6 (ref 5.0–8.0)

## 2023-11-22 MED ORDER — CEPHALEXIN 500 MG PO CAPS: 500.0000 mg | ORAL_CAPSULE | Freq: Three times a day (TID) | ORAL | 0 refills | Status: AC

## 2023-11-22 MED ORDER — PHENAZOPYRIDINE HCL 200 MG PO TABS
200.0000 mg | ORAL_TABLET | Freq: Once | ORAL | Status: AC
  Administered 2023-11-22: 200 mg via ORAL
  Filled 2023-11-22: qty 1

## 2023-11-22 MED ORDER — CEPHALEXIN 500 MG PO CAPS
500.0000 mg | ORAL_CAPSULE | Freq: Once | ORAL | Status: AC
  Administered 2023-11-22: 500 mg via ORAL
  Filled 2023-11-22: qty 1

## 2023-11-22 MED ORDER — LIDOCAINE HCL URETHRAL/MUCOSAL 2 % EX GEL
1.0000 | Freq: Once | CUTANEOUS | Status: AC
  Administered 2023-11-22: 1
  Filled 2023-11-22: qty 11

## 2023-11-22 MED ORDER — PHENAZOPYRIDINE HCL 200 MG PO TABS: 200.0000 mg | ORAL_TABLET | Freq: Three times a day (TID) | ORAL | Status: DC

## 2023-11-22 MED ORDER — PHENAZOPYRIDINE HCL 200 MG PO TABS: 200.0000 mg | ORAL_TABLET | Freq: Three times a day (TID) | ORAL | 0 refills | Status: AC

## 2023-11-22 NOTE — ED Triage Notes (Signed)
 Pt has suprapubic catheter, began having issues with catheter today. Pt is unable to flush catheter, catheter is not draining. Bloody output in bag, pt reports there was milky sediment in the bag previously today.

## 2023-11-22 NOTE — ED Provider Notes (Signed)
 [place] EMERGENCY DEPARTMENT AT [facility] Provider Note   CSN: [id number] Arrival date & time: 11/22/23  2021     Patient presents with: Hematuria (Suprapubic Catheter Issue)   Brandy Hardy is a 68 y.o. female.   Pt is a 68 yo female with pmhx significant for endometrial cancer, vaginal cancer, and cervical adenocarcinoma.  Pt has a suprapubic catheter placed last October because she developed necrotizing fasciitis of the right groin which lead to incontinence and also urinary retention.  Pt has been doing well with the catheter until 2 days ago when she noticed some blood in her urine.  Her husband noticed a lot of sediment in the catheter.  He tried to flush it and had no luck.  Catheter is not draining.  It was last changed about 3 weeks ago.  No fever.        Prior to Admission medications   Medication Sig Start Date End Date Taking? Authorizing Provider  cephALEXin  (KEFLEX ) 500 MG capsule Take 1 capsule (500 mg total) by mouth 3 (three) times daily. 11/22/23  Yes Dean Clarity, MD  phenazopyridine  (PYRIDIUM ) 200 MG tablet Take 1 tablet (200 mg total) by mouth 3 (three) times daily. 11/22/23  Yes Dean Clarity, MD  acetaminophen  (TYLENOL ) 325 MG tablet Take 650 mg by mouth every 6 (six) hours as needed for mild pain (pain score 1-3) or moderate pain (pain score 4-6).    [provider]  amitriptyline  (ELAVIL ) 10 MG tablet Take 10 mg by mouth at bedtime.    [provider]  ibuprofen (ADVIL) 200 MG tablet Take 200 mg by mouth every 6 (six) hours as needed for mild pain (pain score 1-3) or moderate pain (pain score 4-6).    [provider]  Multiple Vitamin (MULTIVITAMIN WITH MINERALS) TABS tablet Take 1 tablet by mouth daily. 03/06/16   Love, Sharlet RAMAN, PA-C  PREMARIN vaginal cream Place 1 application vaginally as needed. Patient not taking: Reported on 01/22/2023 04/30/17   [provider]  Vitamin D , Ergocalciferol , (DRISDOL )  50000 units CAPS capsule TAKE 1 CAPSULE WEEKLY Patient taking differently: 50,000 Units See admin instructions. Twice weekly 05/19/16   [place], MD    Allergies: Patient has no known allergies.    Review of Systems  Genitourinary:  Positive for hematuria.  All other systems reviewed and are negative.   Updated Vital Signs BP (!) 178/66 (BP Location: Left Arm)   Pulse (!) 108   Temp 98 F (36.7 C) (Oral)   Resp 18   Ht 5' 2 (1.575 m)   Wt 130.6 kg   SpO2 95%   BMI 52.68 kg/m   Physical Exam Vitals and nursing note reviewed.  Constitutional:      Appearance: Normal appearance. She is obese.     Comments: Pt looks uncomfortable  HENT:     Head: Normocephalic and atraumatic.     Right Ear: External ear normal.     Left Ear: External ear normal.     Nose: Nose normal.     Mouth/Throat:     Mouth: Mucous membranes are moist.     Pharynx: Oropharynx is clear.  Eyes:     Extraocular Movements: Extraocular movements intact.     Conjunctiva/sclera: Conjunctivae normal.     Pupils: Pupils are equal, round, and reactive to light.  Cardiovascular:     Rate and Rhythm: Normal rate and regular rhythm.     Pulses: Normal pulses.     Heart  sounds: Normal heart sounds.  Pulmonary:     Effort: Pulmonary effort is normal.     Breath sounds: Normal breath sounds.  Abdominal:     General: Abdomen is flat. Bowel sounds are normal.     Palpations: Abdomen is soft.     Comments: Bladder distended.  No urine in catheter bag.  Musculoskeletal:        General: Normal range of motion.     Cervical back: Normal range of motion and neck supple.  Skin:    General: Skin is warm.     Capillary Refill: Capillary refill takes less than 2 seconds.  Neurological:     General: No focal deficit present.     Mental Status: She is alert and oriented to person, place, and time.  Psychiatric:        Mood and Affect: Mood normal.        Behavior: Behavior normal.     (all labs ordered are  listed, but only abnormal results are displayed) Labs Reviewed  URINALYSIS, W/ REFLEX TO CULTURE (INFECTION SUSPECTED)    EKG: None  Radiology: No results found.   BLADDER CATHETERIZATION  Date/Time: 11/22/2023 9:32 PM  Performed by: Dean Clarity, MD Authorized by: Dean Clarity, MD   Consent:    Consent obtained:  Verbal   Consent given by:  Patient   Alternatives discussed:  No treatment Universal protocol:    Test results available: yes     Patient identity confirmed:  Verbally with patient Pre-procedure details:    Procedure purpose:  Therapeutic   Preparation: Patient was prepped and draped in usual sterile fashion   Anesthesia:    Anesthesia method:  Topical application   Topical anesthetic:  Lidocaine  gel Procedure details:    Provider performed due to:  Complicated insertion   Catheter insertion:  Indwelling   Catheter type:  Foley   Catheter size:  16 Fr   Bladder irrigation: no     Number of attempts:  1   Urine characteristics:  Bloody and foul smelling Post-procedure details:    Procedure completion:  Tolerated well, no immediate complications Comments:     Suprapubic cath replacement    Medications Ordered in the ED  cephALEXin  (KEFLEX ) capsule 500 mg (has no administration in time range)  lidocaine  (XYLOCAINE ) 2 % jelly 1 Application (1 Application Other Given by Other 11/22/23 2111)  phenazopyridine  (PYRIDIUM ) tablet 200 mg (200 mg Oral Given 11/22/23 2142)                                    Medical Decision Making Risk Prescription drug management.   This patient presents to the ED for concern of catheter blockage, this involves an extensive number of treatment options, and is a complaint that carries with it a high risk of complications and morbidity.  The differential diagnosis includes hematuria, uti, catheter malfunction   Co morbidities that complicate the patient evaluation  endometrial cancer, vaginal cancer, and cervical  adenocarcinoma   Additional history obtained:  Additional history obtained from epic chart review External records from outside source obtained and reviewed including husband   Lab Tests:  I Ordered, and personally interpreted labs.  UA is delayed as urine has to be sent to [facility] as the machine is down.     Medicines ordered and prescription drug management:  I ordered medication including keflex , pyridium   for sx  Reevaluation of  the patient after these medicines showed that the patient improved I have reviewed the patients home medicines and have made adjustments as needed   Critical Interventions:  Change cath  Problem List / ED Course:  Suprapubic catheter problem:  catheter changed and is draining well.  UA is delayed and pt wants to go.  Pt will be started on abx as there is a suspicion for UTI.  She knows to stop if it comes back without infection.   Reevaluation:  After the interventions noted above, I reevaluated the patient and found that they have :improved   Social Determinants of Health:  Lives at home   Dispostion:  After consideration of the diagnostic results and the patients response to treatment, I feel that the patent would benefit from discharge with outpatient f/u.       Final diagnoses:  Suprapubic catheter dysfunction, initial encounter [facility])    ED Discharge Orders          Ordered    cephALEXin  (KEFLEX ) 500 MG capsule  3 times daily        11/22/23 2221    phenazopyridine  (PYRIDIUM ) 200 MG tablet  3 times daily        11/22/23 2221               Dean Clarity, MD 11/22/23 2226

## 2023-11-23 ENCOUNTER — Encounter (HOSPITAL_BASED_OUTPATIENT_CLINIC_OR_DEPARTMENT_OTHER): Admitting: General Surgery

## 2023-11-24 ENCOUNTER — Encounter (HOSPITAL_BASED_OUTPATIENT_CLINIC_OR_DEPARTMENT_OTHER): Admitting: General Surgery

## 2023-11-24 DIAGNOSIS — N3041 Irradiation cystitis with hematuria: Secondary | ICD-10-CM | POA: Diagnosis not present

## 2023-11-24 LAB — URINE CULTURE

## 2023-11-25 ENCOUNTER — Encounter (HOSPITAL_BASED_OUTPATIENT_CLINIC_OR_DEPARTMENT_OTHER): Admitting: General Surgery

## 2023-11-25 DIAGNOSIS — N3041 Irradiation cystitis with hematuria: Secondary | ICD-10-CM | POA: Diagnosis not present

## 2023-11-26 ENCOUNTER — Ambulatory Visit (HOSPITAL_BASED_OUTPATIENT_CLINIC_OR_DEPARTMENT_OTHER): Admitting: General Surgery

## 2023-11-26 ENCOUNTER — Encounter (HOSPITAL_BASED_OUTPATIENT_CLINIC_OR_DEPARTMENT_OTHER): Admitting: General Surgery

## 2023-11-26 DIAGNOSIS — N3041 Irradiation cystitis with hematuria: Secondary | ICD-10-CM | POA: Diagnosis not present

## 2023-11-27 ENCOUNTER — Encounter (HOSPITAL_BASED_OUTPATIENT_CLINIC_OR_DEPARTMENT_OTHER): Admitting: General Surgery

## 2023-11-27 DIAGNOSIS — N3041 Irradiation cystitis with hematuria: Secondary | ICD-10-CM | POA: Diagnosis not present

## 2023-11-30 ENCOUNTER — Encounter (HOSPITAL_BASED_OUTPATIENT_CLINIC_OR_DEPARTMENT_OTHER): Admitting: General Surgery

## 2023-12-01 ENCOUNTER — Encounter (HOSPITAL_BASED_OUTPATIENT_CLINIC_OR_DEPARTMENT_OTHER): Admitting: General Surgery

## 2023-12-01 DIAGNOSIS — N3041 Irradiation cystitis with hematuria: Secondary | ICD-10-CM | POA: Diagnosis not present

## 2023-12-02 ENCOUNTER — Encounter (HOSPITAL_BASED_OUTPATIENT_CLINIC_OR_DEPARTMENT_OTHER): Admitting: General Surgery

## 2023-12-02 DIAGNOSIS — N3041 Irradiation cystitis with hematuria: Secondary | ICD-10-CM | POA: Diagnosis not present

## 2023-12-03 ENCOUNTER — Encounter (HOSPITAL_BASED_OUTPATIENT_CLINIC_OR_DEPARTMENT_OTHER): Admitting: General Surgery

## 2023-12-03 DIAGNOSIS — N3041 Irradiation cystitis with hematuria: Secondary | ICD-10-CM | POA: Diagnosis not present

## 2023-12-04 ENCOUNTER — Encounter (HOSPITAL_BASED_OUTPATIENT_CLINIC_OR_DEPARTMENT_OTHER): Admitting: General Surgery

## 2023-12-04 DIAGNOSIS — N3041 Irradiation cystitis with hematuria: Secondary | ICD-10-CM | POA: Diagnosis not present

## 2023-12-08 ENCOUNTER — Encounter (HOSPITAL_BASED_OUTPATIENT_CLINIC_OR_DEPARTMENT_OTHER): Attending: General Surgery | Admitting: General Surgery

## 2023-12-08 DIAGNOSIS — C549 Malignant neoplasm of corpus uteri, unspecified: Secondary | ICD-10-CM | POA: Diagnosis not present

## 2023-12-08 DIAGNOSIS — N3041 Irradiation cystitis with hematuria: Secondary | ICD-10-CM | POA: Diagnosis present

## 2023-12-08 DIAGNOSIS — Z923 Personal history of irradiation: Secondary | ICD-10-CM | POA: Insufficient documentation

## 2023-12-09 ENCOUNTER — Encounter (HOSPITAL_BASED_OUTPATIENT_CLINIC_OR_DEPARTMENT_OTHER): Admitting: General Surgery

## 2023-12-09 DIAGNOSIS — N3041 Irradiation cystitis with hematuria: Secondary | ICD-10-CM | POA: Diagnosis not present

## 2023-12-10 ENCOUNTER — Encounter (HOSPITAL_BASED_OUTPATIENT_CLINIC_OR_DEPARTMENT_OTHER): Admitting: General Surgery

## 2023-12-11 ENCOUNTER — Encounter (HOSPITAL_BASED_OUTPATIENT_CLINIC_OR_DEPARTMENT_OTHER): Admitting: General Surgery

## 2023-12-11 DIAGNOSIS — N3041 Irradiation cystitis with hematuria: Secondary | ICD-10-CM | POA: Diagnosis not present

## 2023-12-14 ENCOUNTER — Encounter (HOSPITAL_BASED_OUTPATIENT_CLINIC_OR_DEPARTMENT_OTHER): Admitting: General Surgery

## 2023-12-14 DIAGNOSIS — N3041 Irradiation cystitis with hematuria: Secondary | ICD-10-CM | POA: Diagnosis not present

## 2023-12-15 ENCOUNTER — Encounter (HOSPITAL_BASED_OUTPATIENT_CLINIC_OR_DEPARTMENT_OTHER): Admitting: General Surgery

## 2023-12-15 DIAGNOSIS — N3041 Irradiation cystitis with hematuria: Secondary | ICD-10-CM | POA: Diagnosis not present

## 2023-12-16 ENCOUNTER — Other Ambulatory Visit: Payer: Self-pay

## 2023-12-16 ENCOUNTER — Emergency Department (HOSPITAL_COMMUNITY)
Admission: EM | Admit: 2023-12-16 | Discharge: 2023-12-16 | Disposition: A | Discharge status: Home / Self Care | Attending: Emergency Medicine | Admitting: Emergency Medicine

## 2023-12-16 ENCOUNTER — Encounter (HOSPITAL_BASED_OUTPATIENT_CLINIC_OR_DEPARTMENT_OTHER): Admitting: General Surgery

## 2023-12-16 DIAGNOSIS — Z8542 Personal history of malignant neoplasm of other parts of uterus: Secondary | ICD-10-CM | POA: Diagnosis not present

## 2023-12-16 DIAGNOSIS — R338 Other retention of urine: Secondary | ICD-10-CM

## 2023-12-16 DIAGNOSIS — R339 Retention of urine, unspecified: Secondary | ICD-10-CM | POA: Diagnosis present

## 2023-12-16 DIAGNOSIS — N3041 Irradiation cystitis with hematuria: Secondary | ICD-10-CM | POA: Diagnosis not present

## 2023-12-16 MED ORDER — HYDROCODONE-ACETAMINOPHEN 5-325 MG PO TABS
1.0000 | ORAL_TABLET | Freq: Once | ORAL | Status: AC
  Administered 2023-12-16: 1 via ORAL
  Filled 2023-12-16: qty 1

## 2023-12-16 MED ORDER — OXYBUTYNIN CHLORIDE 5 MG PO TABS
5.0000 mg | ORAL_TABLET | Freq: Three times a day (TID) | ORAL | Status: DC
  Administered 2023-12-16: 5 mg via ORAL
  Filled 2023-12-16: qty 1

## 2023-12-16 NOTE — ED Provider Notes (Signed)
 [place] EMERGENCY DEPARTMENT AT [facility] Provider Note   CSN: [id number] Arrival date & time: 12/16/23  2047     Patient presents with: No chief complaint on file.   Brandy Hardy is a 68 y.o. female.   Patient is a 68 year old female with a history of suprapubic catheter due to having cervical adenocarcinoma with recurrence as well as endometrial cancer with multiple surgeries, radiation and scarring of the urethra which she now has a suprapubic catheter for the last 1 year who is presenting today because she is having significant bladder tension and pain.  Patient went to urology today for her monthly catheter change and reports she had no issues the bag drained a little bit while she was at hyperbaric treatment but since she has been home her catheter has not been draining urine and she is having more more discomfort.  She has not noticed any blood in her urine prior to this occurring.  She was feeling fine until this evening when she was no longer draining urine.  The history is provided by the patient.       Prior to Admission medications   Medication Sig Start Date End Date Taking? Authorizing Provider  acetaminophen  (TYLENOL ) 325 MG tablet Take 650 mg by mouth every 6 (six) hours as needed for mild pain (pain score 1-3) or moderate pain (pain score 4-6).    [provider]  amitriptyline  (ELAVIL ) 10 MG tablet Take 10 mg by mouth at bedtime.    [provider]  cephALEXin  (KEFLEX ) 500 MG capsule Take 1 capsule (500 mg total) by mouth 3 (three) times daily. 11/22/23   Haviland, Julie, MD  ibuprofen (ADVIL) 200 MG tablet Take 200 mg by mouth every 6 (six) hours as needed for mild pain (pain score 1-3) or moderate pain (pain score 4-6).    [provider]  Multiple Vitamin (MULTIVITAMIN WITH MINERALS) TABS tablet Take 1 tablet by mouth daily. 03/06/16   Love, Sharlet RAMAN, PA-C  phenazopyridine  (PYRIDIUM ) 200 MG tablet Take 1 tablet (200 mg  total) by mouth 3 (three) times daily. 11/22/23   Haviland, Julie, MD  PREMARIN vaginal cream Place 1 application vaginally as needed. Patient not taking: Reported on 01/22/2023 04/30/17   [provider]  Vitamin D , Ergocalciferol , (DRISDOL ) 50000 units CAPS capsule TAKE 1 CAPSULE WEEKLY Patient taking differently: 50,000 Units See admin instructions. Twice weekly 05/19/16   Von Pacific, MD    Allergies: Patient has no known allergies.    Review of Systems  Updated Vital Signs BP (!) 173/82   Pulse (!) 105   Temp 98.5 F (36.9 C) (Oral)   Resp 20   Ht 5' 2 (1.575 m)   Wt 131.4 kg   SpO2 95%   BMI 52.98 kg/m   Physical Exam Vitals and nursing note reviewed.  Constitutional:      General: She is not in acute distress.    Appearance: She is well-developed.     Comments: Appears uncomfortable  HENT:     Head: Normocephalic and atraumatic.  Eyes:     Pupils: Pupils are equal, round, and reactive to light.  Cardiovascular:     Rate and Rhythm: Normal rate and regular rhythm.     Heart sounds: Normal heart sounds. No murmur heard.    No friction rub.  Pulmonary:     Effort: Pulmonary effort is normal.     Breath sounds: Normal breath sounds. No wheezing or rales.  Abdominal:  General: Bowel sounds are normal. There is no distension.     Palpations: Abdomen is soft.     Tenderness: There is abdominal tenderness in the suprapubic area. There is no guarding or rebound.     Comments: When lifting up the pannus suprapubic catheter falls onto the bed  Musculoskeletal:        General: No tenderness. Normal range of motion.     Comments: No edema  Skin:    General: Skin is warm and dry.     Findings: No rash.  Neurological:     Mental Status: She is alert and oriented to person, place, and time.     Cranial Nerves: No cranial nerve deficit.  Psychiatric:        Behavior: Behavior normal.     (all labs ordered are listed, but only abnormal results are  displayed) Labs Reviewed - No data to display  EKG: None  Radiology: No results found.   Procedures   Medications Ordered in the ED  oxybutynin  (DITROPAN ) tablet 5 mg (5 mg Oral Given 12/16/23 2142)  HYDROcodone -acetaminophen  (NORCO/VICODIN) 5-325 MG per tablet 1 tablet (1 tablet Oral Given 12/16/23 2142)                                    Medical Decision Making Risk Prescription drug management.   Pt with multiple medical problems and comorbidities and presenting today with a complaint that caries a high risk for morbidity and mortality.  Here today with symptoms most classic for urinary retention due to her suprapubic catheter not being in place.  Attempted multiple times to pass a 59 [place] without any success.  Finally a 12 French Foley catheter was placed with drainage of urine and then dilated initially with a 14 French catheter and then finally a 60 [place] was able to be replaced.  Urine is draining.  Patient reports she is starting to feel better but having bladder spasm.  She was given a dose of Ditropan  and 1 dose of pain medication.  She has so far drained out approximately 500 mL.  10:45 PM On reevaluation patient is feeling much better.  She has drained a total of 800 mL of urine.  At this time she would like to go home.     Final diagnoses:  Acute urinary retention    ED Discharge Orders     None          Doretha Folks, MD 12/16/23 2246

## 2023-12-16 NOTE — ED Triage Notes (Signed)
 Pt presents to the ED via POV with complaints of clogged suprapubic catheter that was replaced today. She was seen by Urology today but since that time she has had minimal output. Spouse made attempts to irrigate the catheter and had some drainage around the catheter. A&Ox4 at this time. Denies CP or SOB.

## 2023-12-17 ENCOUNTER — Encounter (HOSPITAL_BASED_OUTPATIENT_CLINIC_OR_DEPARTMENT_OTHER): Admitting: General Surgery

## 2023-12-17 DIAGNOSIS — N3041 Irradiation cystitis with hematuria: Secondary | ICD-10-CM | POA: Diagnosis not present

## 2024-03-24 ENCOUNTER — Other Ambulatory Visit: Payer: Self-pay | Admitting: Family Medicine

## 2024-03-24 DIAGNOSIS — Z1231 Encounter for screening mammogram for malignant neoplasm of breast: Secondary | ICD-10-CM

## 2024-04-02 ENCOUNTER — Emergency Department (HOSPITAL_COMMUNITY)
Admission: EM | Admit: 2024-04-02 | Discharge: 2024-04-02 | Disposition: A | Discharge status: Home / Self Care | Attending: Emergency Medicine | Admitting: Emergency Medicine

## 2024-04-02 ENCOUNTER — Other Ambulatory Visit: Payer: Self-pay

## 2024-04-02 ENCOUNTER — Encounter (HOSPITAL_COMMUNITY): Payer: Self-pay | Admitting: Emergency Medicine

## 2024-04-02 DIAGNOSIS — Y732 Prosthetic and other implants, materials and accessory gastroenterology and urology devices associated with adverse incidents: Secondary | ICD-10-CM | POA: Insufficient documentation

## 2024-04-02 DIAGNOSIS — R310 Gross hematuria: Secondary | ICD-10-CM | POA: Insufficient documentation

## 2024-04-02 DIAGNOSIS — T83098A Other mechanical complication of other indwelling urethral catheter, initial encounter: Secondary | ICD-10-CM | POA: Insufficient documentation

## 2024-04-02 DIAGNOSIS — Z8542 Personal history of malignant neoplasm of other parts of uterus: Secondary | ICD-10-CM | POA: Insufficient documentation

## 2024-04-02 DIAGNOSIS — Z8541 Personal history of malignant neoplasm of cervix uteri: Secondary | ICD-10-CM | POA: Diagnosis not present

## 2024-04-02 DIAGNOSIS — T83010A Breakdown (mechanical) of cystostomy catheter, initial encounter: Secondary | ICD-10-CM

## 2024-04-02 LAB — URINALYSIS, ROUTINE W REFLEX MICROSCOPIC
Bilirubin Urine: NEGATIVE
Glucose, UA: NEGATIVE mg/dL
Ketones, ur: NEGATIVE mg/dL
Nitrite: POSITIVE — AB
Protein, ur: 100 mg/dL — AB
RBC / HPF: >50 RBC/hpf (ref 0–5)
Specific Gravity, Urine: 1.008 (ref 1.005–1.030)
WBC, UA: >50 WBC/hpf (ref 0–5)
pH: 6 (ref 5.0–8.0)

## 2024-04-02 MED ORDER — CEPHALEXIN 500 MG PO CAPS: 500.0000 mg | ORAL_CAPSULE | Freq: Three times a day (TID) | ORAL | 0 refills | Status: AC

## 2024-04-02 MED ORDER — CEPHALEXIN 250 MG PO CAPS: 250.0000 mg | ORAL_CAPSULE | Freq: Once | ORAL | Status: DC

## 2024-04-02 MED ORDER — LIDOCAINE HCL URETHRAL/MUCOSAL 2 % EX GEL
1.0000 | Freq: Once | CUTANEOUS | Status: AC
  Administered 2024-04-02: 1
  Filled 2024-04-02: qty 11

## 2024-04-02 MED ORDER — CEPHALEXIN 500 MG PO CAPS
500.0000 mg | ORAL_CAPSULE | Freq: Once | ORAL | Status: AC
  Administered 2024-04-02: 500 mg via ORAL
  Filled 2024-04-02: qty 1

## 2024-04-02 MED ORDER — LIDOCAINE HCL URETHRAL/MUCOSAL 2 % EX GEL: 1.0000 | Freq: Once | CUTANEOUS | Status: DC

## 2024-04-02 NOTE — ED Notes (Signed)
 AVS provided by edp was reviewed with the pt. Pt was able to verbalize understanding with no additional questions at this time. Pt departing via personal wheelchair with s/o at bedside

## 2024-04-02 NOTE — ED Triage Notes (Signed)
 Pt arrives w/ c/o suprapubic catheter that hasn't been draining since 11:30 when a new one was placed. Reports spasms in bladder as well.

## 2024-04-02 NOTE — ED Provider Notes (Signed)
 " [place] EMERGENCY DEPARTMENT AT [facility] Provider Note   CSN: [id number] Arrival date & time: 04/02/24  9943     Patient presents with: Catheter issue   Brandy Hardy is a 68 y.o. female.   HPI     This is a 68 year old female who presents with concern for suprapubic catheter dysfunction.  She is having a lot of pain.  Increased sediment noted in the catheter with concerns for blockage.  Husband states that he has been unable to flush the catheter.  She reports that she is very uncomfortable.  She is due to have the catheter changed next week.  She has not had any fevers or back pain.  She is not currently on any prophylactic antibiotics.  Prior to Admission medications  Medication Sig Start Date End Date Taking? Authorizing Provider  acetaminophen  (TYLENOL ) 325 MG tablet Take 650 mg by mouth every 6 (six) hours as needed for mild pain (pain score 1-3) or moderate pain (pain score 4-6).    [provider]  amitriptyline  (ELAVIL ) 10 MG tablet Take 10 mg by mouth at bedtime.    [provider]  cephALEXin  (KEFLEX ) 500 MG capsule Take 1 capsule (500 mg total) by mouth 3 (three) times daily. 11/22/23   Haviland, Julie, MD  ibuprofen (ADVIL) 200 MG tablet Take 200 mg by mouth every 6 (six) hours as needed for mild pain (pain score 1-3) or moderate pain (pain score 4-6).    [provider]  Multiple Vitamin (MULTIVITAMIN WITH MINERALS) TABS tablet Take 1 tablet by mouth daily. 03/06/16   Love, Sharlet RAMAN, PA-C  phenazopyridine  (PYRIDIUM ) 200 MG tablet Take 1 tablet (200 mg total) by mouth 3 (three) times daily. 11/22/23   Haviland, Julie, MD  PREMARIN vaginal cream Place 1 application vaginally as needed. Patient not taking: Reported on 01/22/2023 04/30/17   [provider]  Vitamin D , Ergocalciferol , (DRISDOL ) 50000 units CAPS capsule TAKE 1 CAPSULE WEEKLY Patient taking differently: 50,000 Units See admin instructions. Twice weekly 05/19/16    Von Pacific, MD    Allergies: Patient has no known allergies.    Review of Systems  Constitutional:  Negative for fever.  Genitourinary:  Positive for difficulty urinating. Negative for dysuria and flank pain.  All other systems reviewed and are negative.   Updated Vital Signs BP (!) 165/56 (BP Location: Left Wrist)   Pulse (!) 110   Temp 98 F (36.7 C) (Oral)   Resp 20   Ht 1.575 m (5' 2)   Wt 131.1 kg   SpO2 97%   BMI 52.86 kg/m   Physical Exam Vitals and nursing note reviewed.  Constitutional:      Appearance: She is well-developed. She is obese.     Comments: Uncomfortable appearing but nontoxic  HENT:     Head: Normocephalic and atraumatic.  Cardiovascular:     Rate and Rhythm: Normal rate and regular rhythm.  Pulmonary:     Effort: Pulmonary effort is normal. No respiratory distress.  Abdominal:     Palpations: Abdomen is soft.     Comments: Suprapubic catheter noted, no significant erythema, minimal output in the catheter bag  Musculoskeletal:     Cervical back: Neck supple.  Skin:    General: Skin is warm and dry.  Neurological:     Mental Status: She is alert and oriented to person, place, and time.  Psychiatric:        Mood and Affect: Mood normal.     (  all labs ordered are listed, but only abnormal results are displayed) Labs Reviewed  URINALYSIS, ROUTINE W REFLEX MICROSCOPIC - Abnormal; Notable for the following components:      Result Value   APPearance HAZY (*)    Hgb urine dipstick LARGE (*)    Protein, ur 100 (*)    Nitrite POSITIVE (*)    Leukocytes,Ua LARGE (*)    Bacteria, UA MANY (*)    All other components within normal limits  URINE CULTURE    EKG: None  Radiology: No results found.   ASPIRATION BLADDER INSERT SUPRAPUBIC CATHETER  Date/Time: 04/02/2024 2:27 AM  Performed by: Bari Charmaine FALCON, MD Authorized by: Bari Charmaine FALCON, MD  Consent: Verbal consent obtained Risks and benefits: risks, benefits and  alternatives were discussed Consent given by: patient Patient identity confirmed: verbally with patient Preparation: Patient was prepped and draped in the usual sterile fashion. Local anesthesia used: yes  Anesthesia: Local anesthesia used: yes Local Anesthetic: topical anesthetic (urojet)  Sedation: Patient sedated: no  Patient tolerance: patient tolerated the procedure well with no immediate complications Comments: Catheter exchanged with new 16 French catheter.  No immediate complications noted.  Blood-tinged urine in catheter bag.      Medications Ordered in the ED  lidocaine  (XYLOCAINE ) 2 % jelly 1 Application (has no administration in time range)  lidocaine  (XYLOCAINE ) 2 % jelly 1 Application (has no administration in time range)  cephALEXin  (KEFLEX ) capsule 500 mg (has no administration in time range)                                    Medical Decision Making Amount and/or Complexity of Data Reviewed Labs: ordered.  Risk Prescription drug management.   This patient presents to the ED for concern of suprapubic catheter complication, this involves an extensive number of treatment options, and is a complaint that carries with it a high risk of complications and morbidity.  I considered the following differential and admission for this acute, potentially life threatening condition.  The differential diagnosis includes catheter obstruction, infection  MDM:    This is a 68 year old female with chronic indwelling suprapubic catheter who presents with concern for difficulty with drainage of the catheter.  She is uncomfortable appearing but nontoxic.  Afebrile.  Catheter was exchanged without difficulty with good urine output.  Urinalysis does show nitrite positive urine with greater than 50 red cells and white cells and many bacteria.  She is most definitely colonized.  Not having any fevers or back pain.  However, given this urinalysis will culture and start on Keflex  given  obvious increased sediment.  She is otherwise nontoxic-appearing.  (Labs, imaging, consults)  Labs: I Ordered, and personally interpreted labs.  The pertinent results include: Urinalysis, urine culture  Imaging Studies ordered: I ordered imaging studies including none I independently visualized and interpreted imaging. I agree with the radiologist interpretation  Additional history obtained from husband external records from outside source obtained and reviewed including prior evaluations  Cardiac Monitoring: The patient was not maintained on a cardiac monitor.  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   Renal disorder      Medicines Meds ordered this encounter  Medications   lidocaine  (XYLOCAINE ) 2 % jelly 1 Application   lidocaine  (XYLOCAINE ) 2 % jelly 1 Application   DISCONTD: cephALEXin  (KEFLEX ) capsule 250 mg   cephALEXin  (KEFLEX ) capsule 500 mg    I have reviewed the patients home medicines and have made adjustments as needed  Problem List / ED Course: Problem List Items Addressed This Visit   None Visit Diagnoses       Suprapubic catheter dysfunction, initial encounter    -  Primary                Final diagnoses:  Suprapubic catheter dysfunction, initial encounter    ED Discharge Orders     None          Bari Charmaine FALCON, MD 04/02/24 0232  "

## 2024-04-02 NOTE — Discharge Instructions (Signed)
 Were seen today for concerns for blockage of your Foley catheter.  This was replaced.  Take Keflex  as prescribed.  If you develop fevers, any new or worsening symptoms, you should be reevaluated.

## 2024-04-04 LAB — URINE CULTURE: Culture: >=100000 — AB

## 2024-04-05 ENCOUNTER — Telehealth (HOSPITAL_BASED_OUTPATIENT_CLINIC_OR_DEPARTMENT_OTHER): Payer: Self-pay | Admitting: *Deleted

## 2024-04-05 NOTE — Progress Notes (Signed)
 ED Antimicrobial Stewardship Positive Culture Follow Up   Brandy Hardy is an 68 y.o. female who presented to [facility] on 04/02/2024 with a chief complaint of  Chief Complaint  Patient presents with   Catheter issue    Recent Results (from the past 720 hours)  Urine Culture     Status: Abnormal   Collection Time: 04/02/24  2:39 AM   Specimen: Urine, Catheterized  Result Value Ref Range Status   Specimen Description   Final    URINE, CATHETERIZED Performed at [facility], [address], [place], [place] [address]    Special Requests   Final    NONE Performed at [facility], [address], [place], [place] [address]    Culture (A)  Final    >=100,000 COLONIES/mL PROTEUS MIRABILIS >=100,000 COLONIES/mL ENTEROBACTER CLOACAE    Report Status 04/04/2024 FINAL  Final   Organism ID, Bacteria PROTEUS MIRABILIS (A)  Final   Organism ID, Bacteria ENTEROBACTER CLOACAE (A)  Final      Susceptibility   Enterobacter cloacae - MIC*    CEFEPIME  <=0.12 SENSITIVE Sensitive     ERTAPENEM <=0.12 SENSITIVE Sensitive     CIPROFLOXACIN  <=0.06 SENSITIVE Sensitive     GENTAMICIN <=1 SENSITIVE Sensitive     NITROFURANTOIN  64 INTERMEDIATE Intermediate     TRIMETH/SULFA <=20 SENSITIVE Sensitive     PIP/TAZO Value in next row Sensitive      <=4 SENSITIVEThis is a modified FDA-approved test that has been validated and its performance characteristics determined by the reporting laboratory.  This laboratory is certified under the Clinical Laboratory Improvement Amendments CLIA as qualified to perform high complexity clinical laboratory testing.    MEROPENEM Value in next row Sensitive      <=4 SENSITIVEThis is a modified FDA-approved test that has been validated and its performance characteristics determined by the reporting laboratory.  This laboratory is certified under the Clinical Laboratory Improvement Amendments CLIA as qualified to perform high complexity  clinical laboratory testing.    * >=100,000 COLONIES/mL ENTEROBACTER CLOACAE   Proteus mirabilis - MIC*    AMPICILLIN Value in next row Sensitive      <=4 SENSITIVEThis is a modified FDA-approved test that has been validated and its performance characteristics determined by the reporting laboratory.  This laboratory is certified under the Clinical Laboratory Improvement Amendments CLIA as qualified to perform high complexity clinical laboratory testing.    CEFAZOLIN (URINE) Value in next row Sensitive      4 SENSITIVEThis is a modified FDA-approved test that has been validated and its performance characteristics determined by the reporting laboratory.  This laboratory is certified under the Clinical Laboratory Improvement Amendments CLIA as qualified to perform high complexity clinical laboratory testing.    CEFEPIME  Value in next row Sensitive      4 SENSITIVEThis is a modified FDA-approved test that has been validated and its performance characteristics determined by the reporting laboratory.  This laboratory is certified under the Clinical Laboratory Improvement Amendments CLIA as qualified to perform high complexity clinical laboratory testing.    ERTAPENEM Value in next row Sensitive      4 SENSITIVEThis is a modified FDA-approved test that has been validated and its performance characteristics determined by the reporting laboratory.  This laboratory is certified under the Clinical Laboratory Improvement Amendments CLIA as qualified to perform high complexity clinical laboratory testing.    CEFTRIAXONE  Value in next row Sensitive      4 SENSITIVEThis is a modified FDA-approved  test that has been validated and its performance characteristics determined by the reporting laboratory.  This laboratory is certified under the Clinical Laboratory Improvement Amendments CLIA as qualified to perform high complexity clinical laboratory testing.    CIPROFLOXACIN  Value in next row Sensitive      4  SENSITIVEThis is a modified FDA-approved test that has been validated and its performance characteristics determined by the reporting laboratory.  This laboratory is certified under the Clinical Laboratory Improvement Amendments CLIA as qualified to perform high complexity clinical laboratory testing.    GENTAMICIN Value in next row Sensitive      4 SENSITIVEThis is a modified FDA-approved test that has been validated and its performance characteristics determined by the reporting laboratory.  This laboratory is certified under the Clinical Laboratory Improvement Amendments CLIA as qualified to perform high complexity clinical laboratory testing.    NITROFURANTOIN  Value in next row Resistant      4 SENSITIVEThis is a modified FDA-approved test that has been validated and its performance characteristics determined by the reporting laboratory.  This laboratory is certified under the Clinical Laboratory Improvement Amendments CLIA as qualified to perform high complexity clinical laboratory testing.    TRIMETH/SULFA Value in next row Sensitive      4 SENSITIVEThis is a modified FDA-approved test that has been validated and its performance characteristics determined by the reporting laboratory.  This laboratory is certified under the Clinical Laboratory Improvement Amendments CLIA as qualified to perform high complexity clinical laboratory testing.    AMPICILLIN/SULBACTAM Value in next row Sensitive      4 SENSITIVEThis is a modified FDA-approved test that has been validated and its performance characteristics determined by the reporting laboratory.  This laboratory is certified under the Clinical Laboratory Improvement Amendments CLIA as qualified to perform high complexity clinical laboratory testing.    PIP/TAZO Value in next row Sensitive      <=4 SENSITIVEThis is a modified FDA-approved test that has been validated and its performance characteristics determined by the reporting laboratory.  This laboratory  is certified under the Clinical Laboratory Improvement Amendments CLIA as qualified to perform high complexity clinical laboratory testing.    MEROPENEM Value in next row Sensitive      <=4 SENSITIVEThis is a modified FDA-approved test that has been validated and its performance characteristics determined by the reporting laboratory.  This laboratory is certified under the Clinical Laboratory Improvement Amendments CLIA as qualified to perform high complexity clinical laboratory testing.    * >=100,000 COLONIES/mL PROTEUS MIRABILIS    68 year old female presenting with non-draining suprapubic catheter. Catheter was exchanged in ED with normal return of urine. No other symptoms reported. Given likely colonization, recommend no changes to regimen.   ED Provider: Ozell Arts, MD    Damien Quiet, PharmD, BCPS, BCIDP Infectious Diseases Clinical Pharmacist Phone: [phone] 04/05/2024, 8:52 AM

## 2024-04-05 NOTE — Telephone Encounter (Signed)
 Post ED Visit - Positive Culture Follow-up  Culture report reviewed by antimicrobial stewardship pharmacist: [facility] []  Rankin Dee, Pharm.D. []  Venetia Gully, Pharm.D., BCPS AQ-ID []  Garrel Crews, Pharm.D., BCPS []  Almarie Lunger, Pharm.D., BCPS []  [place], [address].D., BCPS, AAHIVP []  Rosaline Bihari, Pharm.D., BCPS, AAHIVP []  Vernell Meier, PharmD, BCPS []  Latanya Hint, PharmD, BCPS []  Donald Medley, PharmD, BCPS []  Rocky Bold, PharmD []  Dorothyann Alert, PharmD, BCPS []  Morene Babe, PharmD  [facility] [x]  Damien Quiet, PharmD []  Romona Bliss, PharmD []  Dolphus Roller, PharmD []  Veva Seip, Rph []  Vernell Daunt) Leonce, PharmD []  Eva Allis, PharmD []  Rosaline Millet, PharmD []  Iantha Batch, PharmD []  Arvin Gauss, PharmD []  Wanda Hasting, PharmD []  Ronal Rav, PharmD []  Rocky Slade, PharmD []  Bard Jeans, PharmD   Positive urine culture reviewed by Ozell Arts Treated with Cephalexin , likely colonized no change and no further patient follow-up is required at this time.  Jama Wyman Kipper 04/05/2024, 11:56 AM
